# Patient Record
Sex: Female | Born: 1945
Health system: Southern US, Community
[De-identification: ages and names within clinical notes are randomized; demographics above are authoritative.]

## PROBLEM LIST (undated history)

## (undated) DIAGNOSIS — I6529 Occlusion and stenosis of unspecified carotid artery: Secondary | ICD-10-CM

## (undated) DIAGNOSIS — I739 Peripheral vascular disease, unspecified: Secondary | ICD-10-CM

## (undated) DIAGNOSIS — I6523 Occlusion and stenosis of bilateral carotid arteries: Secondary | ICD-10-CM

## (undated) DIAGNOSIS — I5020 Unspecified systolic (congestive) heart failure: Secondary | ICD-10-CM

## (undated) DIAGNOSIS — I214 Non-ST elevation (NSTEMI) myocardial infarction: Secondary | ICD-10-CM

## (undated) DIAGNOSIS — Z860101 Personal history of adenomatous and serrated colon polyps: Secondary | ICD-10-CM

## (undated) DIAGNOSIS — N183 Chronic kidney disease, stage 3 unspecified: Secondary | ICD-10-CM

## (undated) DIAGNOSIS — E7800 Pure hypercholesterolemia, unspecified: Secondary | ICD-10-CM

## (undated) DIAGNOSIS — J189 Pneumonia, unspecified organism: Secondary | ICD-10-CM

## (undated) DIAGNOSIS — Z720 Tobacco use: Secondary | ICD-10-CM

## (undated) DIAGNOSIS — M2550 Pain in unspecified joint: Secondary | ICD-10-CM

## (undated) DIAGNOSIS — I10 Essential (primary) hypertension: Secondary | ICD-10-CM

## (undated) DIAGNOSIS — N179 Acute kidney failure, unspecified: Secondary | ICD-10-CM

## (undated) DIAGNOSIS — R001 Bradycardia, unspecified: Secondary | ICD-10-CM

## (undated) DIAGNOSIS — D696 Thrombocytopenia, unspecified: Secondary | ICD-10-CM

## (undated) DIAGNOSIS — I255 Ischemic cardiomyopathy: Secondary | ICD-10-CM

## (undated) DIAGNOSIS — D693 Immune thrombocytopenic purpura: Secondary | ICD-10-CM

## (undated) DIAGNOSIS — E119 Type 2 diabetes mellitus without complications: Secondary | ICD-10-CM

## (undated) DIAGNOSIS — I200 Unstable angina: Secondary | ICD-10-CM

## (undated) DIAGNOSIS — I455 Other specified heart block: Secondary | ICD-10-CM

## (undated) DIAGNOSIS — B1920 Unspecified viral hepatitis C without hepatic coma: Secondary | ICD-10-CM

## (undated) DIAGNOSIS — I70219 Atherosclerosis of native arteries of extremities with intermittent claudication, unspecified extremity: Secondary | ICD-10-CM

## (undated) DIAGNOSIS — R7989 Other specified abnormal findings of blood chemistry: Secondary | ICD-10-CM

## (undated) DIAGNOSIS — Z48812 Encounter for surgical aftercare following surgery on the circulatory system: Secondary | ICD-10-CM

## (undated) DIAGNOSIS — I5022 Chronic systolic (congestive) heart failure: Secondary | ICD-10-CM

## (undated) DIAGNOSIS — M79609 Pain in unspecified limb: Secondary | ICD-10-CM

## (undated) DIAGNOSIS — Z86010 Personal history of colonic polyps: Secondary | ICD-10-CM

## (undated) DIAGNOSIS — N19 Unspecified kidney failure: Secondary | ICD-10-CM

## (undated) DIAGNOSIS — Z87440 Personal history of urinary (tract) infections: Secondary | ICD-10-CM

## (undated) DIAGNOSIS — E1159 Type 2 diabetes mellitus with other circulatory complications: Secondary | ICD-10-CM

## (undated) DIAGNOSIS — I639 Cerebral infarction, unspecified: Secondary | ICD-10-CM

## (undated) DIAGNOSIS — I701 Atherosclerosis of renal artery: Secondary | ICD-10-CM

## (undated) DIAGNOSIS — N184 Chronic kidney disease, stage 4 (severe): Secondary | ICD-10-CM

## (undated) DIAGNOSIS — I2510 Atherosclerotic heart disease of native coronary artery without angina pectoris: Secondary | ICD-10-CM

## (undated) DIAGNOSIS — D497 Neoplasm of unspecified behavior of endocrine glands and other parts of nervous system: Secondary | ICD-10-CM

## (undated) DIAGNOSIS — I447 Left bundle-branch block, unspecified: Secondary | ICD-10-CM

## (undated) DIAGNOSIS — I779 Disorder of arteries and arterioles, unspecified: Secondary | ICD-10-CM

## (undated) DIAGNOSIS — C50911 Malignant neoplasm of unspecified site of right female breast: Secondary | ICD-10-CM

## (undated) DIAGNOSIS — R0600 Dyspnea, unspecified: Secondary | ICD-10-CM

## (undated) HISTORY — PX: ABDOMINAL HYSTERECTOMY: SHX81

## (undated) HISTORY — DX: Chronic systolic (congestive) heart failure: I50.22

## (undated) HISTORY — DX: Personal history of colonic polyps: Z86.010

## (undated) HISTORY — DX: Unspecified kidney failure: N19

## (undated) HISTORY — DX: Cerebral infarction, unspecified: I63.9

## (undated) HISTORY — DX: Dyspnea, unspecified: R06.00

## (undated) HISTORY — DX: Occlusion and stenosis of unspecified carotid artery: I65.29

## (undated) HISTORY — DX: Unspecified viral hepatitis C without hepatic coma: B19.20

## (undated) HISTORY — DX: Other specified heart block: I45.5

## (undated) HISTORY — DX: Thrombocytopenia, unspecified: D69.6

## (undated) HISTORY — DX: Pneumonia, unspecified organism: J18.9

## (undated) HISTORY — DX: Atherosclerotic heart disease of native coronary artery without angina pectoris: I25.10

## (undated) HISTORY — DX: Immune thrombocytopenic purpura: D69.3

## (undated) HISTORY — DX: Peripheral vascular disease, unspecified: I73.9

## (undated) HISTORY — DX: Chronic kidney disease, stage 3 (moderate): N18.3

## (undated) HISTORY — DX: Atherosclerosis of native arteries of extremities with intermittent claudication, unspecified extremity: I70.219

## (undated) HISTORY — DX: Type 2 diabetes mellitus with other circulatory complications: E11.59

## (undated) HISTORY — DX: Occlusion and stenosis of bilateral carotid arteries: I65.23

## (undated) HISTORY — DX: Unstable angina: I20.0

## (undated) HISTORY — DX: Other specified abnormal findings of blood chemistry: R79.89

## (undated) HISTORY — DX: Bradycardia, unspecified: R00.1

## (undated) HISTORY — DX: Tobacco use: Z72.0

## (undated) HISTORY — DX: Left bundle-branch block, unspecified: I44.7

## (undated) HISTORY — DX: Disorder of arteries and arterioles, unspecified: I77.9

## (undated) HISTORY — PX: LUMBAR DISC SURGERY: SHX700

## (undated) HISTORY — DX: Pain in unspecified limb: M79.609

## (undated) HISTORY — DX: Atherosclerosis of renal artery: I70.1

## (undated) HISTORY — DX: Non-ST elevation (NSTEMI) myocardial infarction: I21.4

## (undated) HISTORY — DX: Chronic kidney disease, stage 4 (severe): N18.4

## (undated) HISTORY — DX: Personal history of adenomatous and serrated colon polyps: Z86.0101

## (undated) HISTORY — DX: Chronic kidney disease, stage 3 unspecified: N18.30

## (undated) HISTORY — DX: Acute kidney failure, unspecified: N17.9

## (undated) HISTORY — DX: Ischemic cardiomyopathy: I25.5

## (undated) HISTORY — DX: Encounter for surgical aftercare following surgery on the circulatory system: Z48.812

---

## 1997-10-23 ENCOUNTER — Emergency Department (HOSPITAL_COMMUNITY): Admission: EM | Admit: 1997-10-23 | Discharge: 1997-10-23 | Payer: Self-pay | Admitting: Emergency Medicine

## 1998-06-24 ENCOUNTER — Emergency Department (HOSPITAL_COMMUNITY): Admission: EM | Admit: 1998-06-24 | Discharge: 1998-06-24 | Payer: Self-pay | Admitting: Emergency Medicine

## 1998-08-25 ENCOUNTER — Encounter: Admission: RE | Admit: 1998-08-25 | Discharge: 1998-09-12 | Payer: Self-pay | Admitting: Internal Medicine

## 1999-08-26 ENCOUNTER — Emergency Department (HOSPITAL_COMMUNITY): Admission: EM | Admit: 1999-08-26 | Discharge: 1999-08-26 | Payer: Self-pay | Admitting: Emergency Medicine

## 2000-10-19 ENCOUNTER — Encounter: Payer: Self-pay | Admitting: Internal Medicine

## 2000-10-19 ENCOUNTER — Encounter: Admission: RE | Admit: 2000-10-19 | Discharge: 2000-10-19 | Payer: Self-pay | Admitting: Internal Medicine

## 2000-12-10 ENCOUNTER — Emergency Department (HOSPITAL_COMMUNITY): Admission: EM | Admit: 2000-12-10 | Discharge: 2000-12-11 | Payer: Self-pay | Admitting: Emergency Medicine

## 2003-06-14 ENCOUNTER — Emergency Department (HOSPITAL_COMMUNITY): Admission: EM | Admit: 2003-06-14 | Discharge: 2003-06-15 | Payer: Self-pay | Admitting: Emergency Medicine

## 2003-07-09 ENCOUNTER — Emergency Department (HOSPITAL_COMMUNITY): Admission: EM | Admit: 2003-07-09 | Discharge: 2003-07-09 | Payer: Self-pay | Admitting: Emergency Medicine

## 2003-09-04 ENCOUNTER — Encounter: Admission: RE | Admit: 2003-09-04 | Discharge: 2003-09-04 | Payer: Self-pay | Admitting: Internal Medicine

## 2003-10-09 ENCOUNTER — Emergency Department (HOSPITAL_COMMUNITY): Admission: EM | Admit: 2003-10-09 | Discharge: 2003-10-09 | Payer: Self-pay | Admitting: Emergency Medicine

## 2004-04-08 ENCOUNTER — Encounter: Admission: RE | Admit: 2004-04-08 | Discharge: 2004-04-08 | Payer: Self-pay | Admitting: Internal Medicine

## 2004-08-13 ENCOUNTER — Encounter: Admission: RE | Admit: 2004-08-13 | Discharge: 2004-08-13 | Payer: Self-pay | Admitting: Internal Medicine

## 2004-09-10 ENCOUNTER — Emergency Department (HOSPITAL_COMMUNITY): Admission: EM | Admit: 2004-09-10 | Discharge: 2004-09-10 | Payer: Self-pay | Admitting: Emergency Medicine

## 2004-09-29 ENCOUNTER — Encounter: Admission: RE | Admit: 2004-09-29 | Discharge: 2004-10-19 | Payer: Self-pay | Admitting: Internal Medicine

## 2004-10-20 ENCOUNTER — Encounter: Admission: RE | Admit: 2004-10-20 | Discharge: 2004-11-11 | Payer: Self-pay | Admitting: Internal Medicine

## 2004-11-03 ENCOUNTER — Ambulatory Visit: Payer: Self-pay | Admitting: Gastroenterology

## 2005-01-01 ENCOUNTER — Ambulatory Visit: Payer: Self-pay | Admitting: Gastroenterology

## 2005-02-08 ENCOUNTER — Ambulatory Visit (HOSPITAL_COMMUNITY): Admission: RE | Admit: 2005-02-08 | Discharge: 2005-02-08 | Payer: Self-pay | Admitting: Gastroenterology

## 2005-02-08 ENCOUNTER — Encounter (INDEPENDENT_AMBULATORY_CARE_PROVIDER_SITE_OTHER): Payer: Self-pay | Admitting: *Deleted

## 2005-04-28 ENCOUNTER — Encounter: Admission: RE | Admit: 2005-04-28 | Discharge: 2005-04-28 | Payer: Self-pay | Admitting: Family Medicine

## 2005-06-07 ENCOUNTER — Ambulatory Visit (HOSPITAL_COMMUNITY): Admission: RE | Admit: 2005-06-07 | Discharge: 2005-06-07 | Payer: Self-pay | Admitting: Internal Medicine

## 2005-11-25 ENCOUNTER — Encounter: Admission: RE | Admit: 2005-11-25 | Discharge: 2005-11-25 | Payer: Self-pay | Admitting: Internal Medicine

## 2005-12-06 ENCOUNTER — Encounter: Admission: RE | Admit: 2005-12-06 | Discharge: 2005-12-06 | Payer: Self-pay | Admitting: Internal Medicine

## 2005-12-23 ENCOUNTER — Emergency Department (HOSPITAL_COMMUNITY): Admission: EM | Admit: 2005-12-23 | Discharge: 2005-12-23 | Payer: Self-pay | Admitting: Emergency Medicine

## 2006-04-18 ENCOUNTER — Encounter: Admission: RE | Admit: 2006-04-18 | Discharge: 2006-04-18 | Payer: Self-pay | Admitting: *Deleted

## 2006-05-03 HISTORY — PX: RENAL ARTERY STENT: SHX2321

## 2006-05-09 ENCOUNTER — Encounter: Admission: RE | Admit: 2006-05-09 | Discharge: 2006-05-09 | Payer: Self-pay | Admitting: *Deleted

## 2006-05-26 ENCOUNTER — Encounter: Admission: RE | Admit: 2006-05-26 | Discharge: 2006-05-26 | Payer: Self-pay | Admitting: *Deleted

## 2006-05-31 ENCOUNTER — Ambulatory Visit (HOSPITAL_COMMUNITY): Admission: AD | Admit: 2006-05-31 | Discharge: 2006-05-31 | Payer: Self-pay | Admitting: *Deleted

## 2006-06-30 ENCOUNTER — Encounter: Admission: RE | Admit: 2006-06-30 | Discharge: 2006-06-30 | Payer: Self-pay | Admitting: Interventional Radiology

## 2006-07-13 ENCOUNTER — Ambulatory Visit (HOSPITAL_COMMUNITY): Admission: RE | Admit: 2006-07-13 | Discharge: 2006-07-13 | Payer: Self-pay | Admitting: Internal Medicine

## 2006-07-20 ENCOUNTER — Encounter (INDEPENDENT_AMBULATORY_CARE_PROVIDER_SITE_OTHER): Payer: Self-pay | Admitting: Diagnostic Radiology

## 2006-07-20 ENCOUNTER — Encounter: Admission: RE | Admit: 2006-07-20 | Discharge: 2006-07-20 | Payer: Self-pay

## 2006-09-06 ENCOUNTER — Encounter: Admission: RE | Admit: 2006-09-06 | Discharge: 2006-09-06 | Payer: Self-pay | Admitting: Surgery

## 2006-09-06 ENCOUNTER — Encounter (INDEPENDENT_AMBULATORY_CARE_PROVIDER_SITE_OTHER): Payer: Self-pay | Admitting: Surgery

## 2006-09-06 ENCOUNTER — Ambulatory Visit (HOSPITAL_BASED_OUTPATIENT_CLINIC_OR_DEPARTMENT_OTHER): Admission: RE | Admit: 2006-09-06 | Discharge: 2006-09-06 | Payer: Self-pay | Admitting: Surgery

## 2006-09-26 ENCOUNTER — Encounter (INDEPENDENT_AMBULATORY_CARE_PROVIDER_SITE_OTHER): Payer: Self-pay | Admitting: Surgery

## 2006-09-26 ENCOUNTER — Ambulatory Visit (HOSPITAL_BASED_OUTPATIENT_CLINIC_OR_DEPARTMENT_OTHER): Admission: RE | Admit: 2006-09-26 | Discharge: 2006-09-26 | Payer: Self-pay | Admitting: Surgery

## 2006-09-30 ENCOUNTER — Encounter: Admission: RE | Admit: 2006-09-30 | Discharge: 2006-09-30 | Payer: Self-pay | Admitting: *Deleted

## 2006-10-20 ENCOUNTER — Ambulatory Visit: Payer: Self-pay | Admitting: Oncology

## 2006-10-25 LAB — CBC WITH DIFFERENTIAL/PLATELET
BASO%: 1.7 % (ref 0.0–2.0)
Basophils Absolute: 0.1 10*3/uL (ref 0.0–0.1)
EOS%: 1.9 % (ref 0.0–7.0)
HCT: 39.5 % (ref 34.8–46.6)
HGB: 13.8 g/dL (ref 11.6–15.9)
LYMPH%: 40 % (ref 14.0–48.0)
MCH: 30 pg (ref 26.0–34.0)
MCHC: 35 g/dL (ref 32.0–36.0)
MCV: 85.8 fL (ref 81.0–101.0)
MONO%: 8 % (ref 0.0–13.0)
NEUT%: 48.4 % (ref 39.6–76.8)

## 2006-10-25 LAB — COMPREHENSIVE METABOLIC PANEL
ALT: 24 U/L (ref 0–35)
AST: 23 U/L (ref 0–37)
Alkaline Phosphatase: 92 U/L (ref 39–117)
BUN: 17 mg/dL (ref 6–23)
Calcium: 10.1 mg/dL (ref 8.4–10.5)
Creatinine, Ser: 0.72 mg/dL (ref 0.40–1.20)
Total Bilirubin: 0.7 mg/dL (ref 0.3–1.2)

## 2006-10-31 ENCOUNTER — Ambulatory Visit: Admission: RE | Admit: 2006-10-31 | Discharge: 2007-01-15 | Payer: Self-pay | Admitting: Radiation Oncology

## 2006-12-08 ENCOUNTER — Encounter: Admission: RE | Admit: 2006-12-08 | Discharge: 2006-12-08 | Payer: Self-pay | Admitting: Interventional Radiology

## 2007-01-24 ENCOUNTER — Ambulatory Visit: Payer: Self-pay | Admitting: Oncology

## 2007-02-02 HISTORY — PX: BREAST LUMPECTOMY: SHX2

## 2007-02-02 HISTORY — PX: ADRENAL GLAND SURGERY: SHX544

## 2007-02-03 ENCOUNTER — Ambulatory Visit (HOSPITAL_COMMUNITY): Admission: RE | Admit: 2007-02-03 | Discharge: 2007-02-03 | Payer: Self-pay | Admitting: Nephrology

## 2007-03-07 ENCOUNTER — Ambulatory Visit: Payer: Self-pay | Admitting: Oncology

## 2007-03-09 LAB — CBC WITH DIFFERENTIAL/PLATELET
BASO%: 0.6 % (ref 0.0–2.0)
Basophils Absolute: 0 10*3/uL (ref 0.0–0.1)
EOS%: 2.5 % (ref 0.0–7.0)
MCH: 28.1 pg (ref 26.0–34.0)
MCHC: 33.1 g/dL (ref 32.0–36.0)
MCV: 85.1 fL (ref 81.0–101.0)
MONO%: 10.2 % (ref 0.0–13.0)
RDW: 13.2 % (ref 11.3–14.5)
lymph#: 1.3 10*3/uL (ref 0.9–3.3)

## 2007-03-09 LAB — COMPREHENSIVE METABOLIC PANEL
ALT: 32 U/L (ref 0–35)
AST: 31 U/L (ref 0–37)
Albumin: 4.3 g/dL (ref 3.5–5.2)
Alkaline Phosphatase: 80 U/L (ref 39–117)
BUN: 17 mg/dL (ref 6–23)
Calcium: 10 mg/dL (ref 8.4–10.5)
Chloride: 102 mEq/L (ref 96–112)
Creatinine, Ser: 0.73 mg/dL (ref 0.40–1.20)
Potassium: 3.7 mEq/L (ref 3.5–5.3)

## 2007-07-21 ENCOUNTER — Encounter: Admission: RE | Admit: 2007-07-21 | Discharge: 2007-07-21 | Payer: Self-pay | Admitting: Oncology

## 2007-08-02 DIAGNOSIS — D497 Neoplasm of unspecified behavior of endocrine glands and other parts of nervous system: Secondary | ICD-10-CM

## 2007-08-02 HISTORY — DX: Neoplasm of unspecified behavior of endocrine glands and other parts of nervous system: D49.7

## 2007-08-13 ENCOUNTER — Emergency Department (HOSPITAL_COMMUNITY): Admission: EM | Admit: 2007-08-13 | Discharge: 2007-08-13 | Payer: Self-pay | Admitting: Emergency Medicine

## 2007-08-15 ENCOUNTER — Ambulatory Visit: Payer: Self-pay | Admitting: Oncology

## 2007-08-29 ENCOUNTER — Inpatient Hospital Stay (HOSPITAL_COMMUNITY): Admission: RE | Admit: 2007-08-29 | Discharge: 2007-08-31 | Payer: Self-pay | Admitting: Surgery

## 2007-08-29 ENCOUNTER — Encounter (INDEPENDENT_AMBULATORY_CARE_PROVIDER_SITE_OTHER): Payer: Self-pay | Admitting: Surgery

## 2007-10-03 ENCOUNTER — Ambulatory Visit: Payer: Self-pay | Admitting: Oncology

## 2007-10-03 LAB — CBC WITH DIFFERENTIAL/PLATELET
BASO%: 0.6 % (ref 0.0–2.0)
Basophils Absolute: 0 10*3/uL (ref 0.0–0.1)
EOS%: 2.7 % (ref 0.0–7.0)
HGB: 11.2 g/dL — ABNORMAL LOW (ref 11.6–15.9)
MCH: 29.4 pg (ref 26.0–34.0)
MCHC: 33.5 g/dL (ref 32.0–36.0)
MONO%: 11.9 % (ref 0.0–13.0)
RBC: 3.82 10*6/uL (ref 3.70–5.32)
RDW: 14.3 % (ref 11.3–14.5)
lymph#: 1.7 10*3/uL (ref 0.9–3.3)

## 2007-10-03 LAB — COMPREHENSIVE METABOLIC PANEL
ALT: 15 U/L (ref 0–35)
AST: 17 U/L (ref 0–37)
Albumin: 4.2 g/dL (ref 3.5–5.2)
Alkaline Phosphatase: 76 U/L (ref 39–117)
Calcium: 10.5 mg/dL (ref 8.4–10.5)
Chloride: 107 mEq/L (ref 96–112)
Potassium: 4.3 mEq/L (ref 3.5–5.3)
Sodium: 139 mEq/L (ref 135–145)
Total Protein: 7.1 g/dL (ref 6.0–8.3)

## 2008-07-22 ENCOUNTER — Encounter: Admission: RE | Admit: 2008-07-22 | Discharge: 2008-07-22 | Payer: Self-pay | Admitting: Oncology

## 2008-09-30 ENCOUNTER — Ambulatory Visit: Payer: Self-pay | Admitting: Oncology

## 2009-03-22 ENCOUNTER — Emergency Department (HOSPITAL_COMMUNITY): Admission: EM | Admit: 2009-03-22 | Discharge: 2009-03-22 | Payer: Self-pay | Admitting: Emergency Medicine

## 2009-07-24 ENCOUNTER — Encounter: Admission: RE | Admit: 2009-07-24 | Discharge: 2009-07-24 | Payer: Self-pay | Admitting: Oncology

## 2010-01-03 ENCOUNTER — Emergency Department (HOSPITAL_COMMUNITY)
Admission: EM | Admit: 2010-01-03 | Discharge: 2010-01-03 | Payer: Self-pay | Source: Home / Self Care | Admitting: Emergency Medicine

## 2010-01-16 ENCOUNTER — Ambulatory Visit (HOSPITAL_COMMUNITY)
Admission: RE | Admit: 2010-01-16 | Discharge: 2010-01-16 | Payer: Self-pay | Source: Home / Self Care | Attending: Nephrology | Admitting: Nephrology

## 2010-04-13 LAB — POCT I-STAT, CHEM 8
BUN: 15 mg/dL (ref 6–23)
Calcium, Ion: 1.26 mmol/L (ref 1.12–1.32)
Chloride: 110 mEq/L (ref 96–112)
Glucose, Bld: 119 mg/dL — ABNORMAL HIGH (ref 70–99)

## 2010-04-13 LAB — CBC
HCT: 38.6 % (ref 36.0–46.0)
MCHC: 32.6 g/dL (ref 30.0–36.0)
MCV: 88.1 fL (ref 78.0–100.0)
RDW: 13.7 % (ref 11.5–15.5)

## 2010-04-13 LAB — APTT: aPTT: 28 seconds (ref 24–37)

## 2010-04-13 LAB — GLUCOSE, CAPILLARY: Glucose-Capillary: 108 mg/dL — ABNORMAL HIGH (ref 70–99)

## 2010-04-22 ENCOUNTER — Other Ambulatory Visit: Payer: Self-pay | Admitting: Family Medicine

## 2010-04-22 DIAGNOSIS — R0989 Other specified symptoms and signs involving the circulatory and respiratory systems: Secondary | ICD-10-CM

## 2010-04-22 LAB — DIFFERENTIAL
Basophils Absolute: 0 10*3/uL (ref 0.0–0.1)
Eosinophils Relative: 0 % (ref 0–5)
Lymphocytes Relative: 9 % — ABNORMAL LOW (ref 12–46)
Monocytes Absolute: 0.9 10*3/uL (ref 0.1–1.0)
Monocytes Relative: 10 % (ref 3–12)

## 2010-04-22 LAB — POCT I-STAT, CHEM 8
BUN: 18 mg/dL (ref 6–23)
Chloride: 108 mEq/L (ref 96–112)
Creatinine, Ser: 1 mg/dL (ref 0.4–1.2)
Potassium: 3.1 mEq/L — ABNORMAL LOW (ref 3.5–5.1)
Sodium: 139 mEq/L (ref 135–145)
TCO2: 24 mmol/L (ref 0–100)

## 2010-04-22 LAB — CBC
HCT: 35 % — ABNORMAL LOW (ref 36.0–46.0)
Hemoglobin: 12.1 g/dL (ref 12.0–15.0)
RDW: 13 % (ref 11.5–15.5)

## 2010-04-22 LAB — POCT CARDIAC MARKERS
CKMB, poc: 1.2 ng/mL (ref 1.0–8.0)
Troponin i, poc: 0.06 ng/mL (ref 0.00–0.09)

## 2010-04-27 ENCOUNTER — Ambulatory Visit
Admission: RE | Admit: 2010-04-27 | Discharge: 2010-04-27 | Disposition: A | Discharge status: Home / Self Care | Payer: Medicare HMO | Source: Ambulatory Visit | Attending: Family Medicine | Admitting: Family Medicine

## 2010-04-27 DIAGNOSIS — R0989 Other specified symptoms and signs involving the circulatory and respiratory systems: Secondary | ICD-10-CM

## 2010-06-16 NOTE — Discharge Summary (Signed)
NAME:  Kristen Bradley, Kristen Bradley                  ACCOUNT NO.:  [id number]   MEDICAL RECORD NO.:  [id number]          PATIENT TYPE:  INP   LOCATION:  [id number]                         FACILITY:  [facility]   PHYSICIAN:  Adin Hector, MD     DATE OF BIRTH:  1945/12/29   DATE OF ADMISSION:  08/29/2007  DATE OF DISCHARGE:  08/31/2007                               DISCHARGE SUMMARY   SURGEON:  Michael Boston, M.D.   PRIMARY CARE PHYSICIAN:  Bonnita Hollow, Nurse Practitioner at [facility] in  [place].   NEPHROLOGIST:  Windy Kalata, M.D.   CHIEF FINAL DISCHARGE DIAGNOSIS:  1. Left adrenal aldosteronoma.  2. ALLERGY TO DILAUDID.   DIAGNOSES:  1. Severe hypertension secondary to above.  2. Atherosclerotic disease of the aortoiliac region.  3. Claudication.  4. Diabetes mellitus.  5. Renal artery stenosis, status post bilateral renal artery stenting      in 05/2006.  6. Hepatitis C.  7. Ductal carcinoma in situ to right breast, status post breast biopsy      September 06, 2007.  8. Lumbar disc disease, status post back surgery.  9. Tobacco abuse, currently smokes one-half pack per day.   PROCEDURE PERFORMED:  Laparoscopic left adrenalectomy with lysis of  adhesions and control of splenic bleeding on August 29, 2007.   HOSPITAL COURSE:  Ms. Lessard is a 65 year old female with numerous health  issues who had hypertension requiring numerous blood pressure  medications to control.  She has had renal artery stenosis that has been  controlled but still had elevation.  She had a work up that was  concerning for an aldosteronoma based on venous sampling.   The patient was sent for surgical consultation.  Recommendations made  for adrenalectomy.  I thought she would be a reasonable candidate for a  laparoscopic approach given her reasonably body habitus and small  adenoma.  Risks, benefits and alternatives were discussed in detail,  questions were answered and she agreed to proceed.  She underwent this  on  August 29, 2007 without any difficulty.   Postoperatively, she required intravenous fluids, pain and nausea  control.  She had some itching with Dilaudid but was switched over.  She  was advanced on a diet and by postoperative day #2 she was walking the  hallways with adequate pain control.  Based on the improvements, I felt  it would be reasonable for her to be discharged home with the following  instructions:   We followed and controlled her blood pressure and diabetic issues.  She  had itching to Dilaudid.   DISCHARGE INSTRUCTIONS:  1. She is to return to the clinic to see me in two weeks.  2. She should continue her home blood pressure medications for now and      taper off as needed.  These medications include:      a.     Atenolol 100 mg daily.      b.     Spironolactone 25 mg b.i.d.      c.     Clonidine 0.1 mg twice daily.  d.     Amlodipine 10 mg daily.      e.     Benicar 20 mg daily.      f.     Aspirin 81 mg daily.      g.     Potassium chloride 40 mEq daily.      h.     Metformin 500 p.o. b.i.d.  3. If the patient should call if she has any fevers, chills, sweats,      nausea, vomiting worsening abdominal pain, drainage from her      incisions or other concerns.  4. She should begin to increase activity as tolerated, walking up to      30 minutes a day.  Call if she has any worsening pain or      discomfort.  5. Her family felt comfortable in helping to manage her and will call      if they have any issues.      Adin Hector, MD  Electronically Signed     SCG/MEDQ  D:  10/02/2007  T:  10/02/2007  Job:  [id number]   cc:   Jamas Lav, N.PSadie Haber at [facility], M.D.  Fax: [phone]

## 2010-06-16 NOTE — Op Note (Signed)
NAME:  Kristen Bradley, Kristen Bradley                  ACCOUNT NO.:  [id number]   MEDICAL RECORD NO.:  [id number]          PATIENT TYPE:  AMB   LOCATION:  [place]                          FACILITY:  [facility]   PHYSICIAN:  Thomas A. Cornett, M.D.DATE OF BIRTH:  Jul 20, 1945   DATE OF PROCEDURE:  09/26/2006  DATE OF DISCHARGE:                               OPERATIVE REPORT   PREOPERATIVE DIAGNOSIS:  Right breast DCIS with close margin.   POSTOPERATIVE DIAGNOSIS:  Right breast DCIS with close margin.   PROCEDURE:  Re-excision of right breast, lumpectomy cavity.   SURGEON:  Erroll Luna, M.D.   ANESTHESIA:  MAC with 30 mL of 0.25% Sensorcaine.   ESTIMATED BLOOD LOSS:  20 mL.   SPECIMEN:  Lumpectomy cavity and margins identified by new stitch on  surgical margin to pathology.   DRAINS:  None.   INDICATIONS FOR PROCEDURE:  The patient is a 65 year old female who had  a right breast lumpectomy for DCIS.  Her margins were negative, but  superficial and deep margins were at 0.18 cm.  She refused radiation  therapy postoperatively and did not want a mastectomy.  Recommended re-  excision for wider margin for better control.  She agreed to do this.   DESCRIPTION OF PROCEDURE:  The patient was brought to the operating room  and placed supine.  After induction of MAC anesthesia, right breast was  prepped and draped in a sterile fashion.  Previous right upper quadrant  scar was identified and injected local anesthesia.  I opened the  incision and found the lumpectomy cavity.  Superficial, deep, superior,  inferior, medial and lateral margins were all reexcised.  Hemostasis was  excellent.  Wounds were closed in layers with 3-0 Vicryl and 4-0  Monocryl for a subcuticular stitch.  Dermabond was placed for dressing.  All final counts of sponge, needle and instruments were found be correct  at this portion of the case.  The patient was awoke and taken to  recovery in satisfactory condition.      Thomas A.  Cornett, M.D.  Electronically Signed     TAC/MEDQ  D:  09/26/2006  T:  09/27/2006  Job:  [id number]

## 2010-06-16 NOTE — Op Note (Signed)
NAME:  Kristen Bradley, Kristen Bradley                  ACCOUNT NO.:  [id number]   MEDICAL RECORD NO.:  [id number]          PATIENT TYPE:  INP   LOCATION:  0001                         [facility]ACILITY:  [facility]   PHYSICIAN:  Adin Hector, MD     DATE OF BIRTH:  11/15/1945   DATE OF PROCEDURE:  08/29/2007  DATE OF DISCHARGE:                               OPERATIVE REPORT   PRIMARY CARE PHYSICIAN:  Talmage Nap, NP, at [facility].   NEPHROLOGIST:  Fleet Contras, MD.   PREOPERATIVE DIAGNOSIS:  Left adrenal aldosteronoma with severe  hypertension.   POSTOPERATIVE DIAGNOSIS:  Left adrenal aldosteronoma with severe  hypertension.   SURGEON:  Michael Boston, MD.   ASSISTANT:  Jackolyn Confer, MD.   PROCEDURE PERFORMED:  1. Laparoscopic lysis of adhesions x30 minutes.  2. Laparoscopic left adrenalectomy.  3. Laparoscopic control of splenic bleeding.   ANESTHESIA:  1. General anesthesia.  2. Local anesthetic of field block at all port sites.   SPECIMENS:  Left adrenal gland with firm mass at the superior pole of  the adrenal gland consistent with a small adrenaloma.   DRAINS:  A 19-French Blake drain rests along the left retroperitoneum  and posterior to the spleen out a left upper quadrant port site.   ESTIMATED BLOOD LOSS:  1200 mL.   COMPLICATIONS:  Tear near the splenic hilum, resulting in need of more  aggressive control.   INDICATIONS:  [name] is a 65 year old female with numerous health  issues and severe hypertension on numerous blood pressure medications  with workup showing a left adrenal mass and adrenal vein sampling  consistent with a left adrenal aldosteronoma.   Nanophysiology adrenal gland was discussed.  Pathophysiology of  aldosteronoma with hypertension was discussed as well.  I have discussed  this need and recommendation was made for a laparoscopic, possible open,  removal left adrenal gland.  Risks, benefits and alternative were  discussed.  The risks of  bleeding, transfusion, wound infection,  abscess, injury to organs and numerous other risks were discussed.  Questions were answered and she agreed to proceed.  I again discussed  the case just prior to surgery as well.  She felt comfortable to  proceed.   OPERATIVE FINDINGS:  She had dense adhesions in the left upper quadrant  of her greater omentum of uncertain etiology.  In fact, she had no prior  abdominal surgery.  She had a slightly enlarged left adrenal gland with  the classic cadmium yellow/orange appearance.  There was some fibrotic  mass in the superoposterior aspect consistent with probable  aldosteronoma.  She had an enlarged and fragile spleen with some dense  fibrosis.   DESCRIPTION OF PROCEDURE:  Informed consent was confirmed.  Patient  received IV cefazolin just prior to surgery.  She underwent general  anesthesia without any difficulty.  She had Foley catheter sterilely  placed.  She was positioned right side down with an axillary roll in  place and gentle arm support out laterally.  She had been placed on a  sandbag.  She had the classic break with  a kidney type break in the  table.  She was secured numerous places.  Her left abdomen and left back  were prepped and draped in the sterile fashion.   A 5 mm port was placed in left upper quadrant using optical entry  technique with the patient in reverse Trendelenburg and after orogastric  tube had been placed.  Capnoperitoneum to 15 mmHg provided good intra-  abdominal insufflation.  There was no evidence of any intra-abdominal  injury.  Under direct visualization a 5 mm port was placed in the left  anterior axilla and the left mid axillary line.  The port was placed in  the left midclavicular line.  Later, one of the ports was upgraded to 10  and  the more medial port was upgraded to a 12 mm port site.   Camera inspection revealed findings as above.  The transverse colon and  splenic flexure of the colon was freed  off the greater omentum using  ultrasonic Harmonic dissection to help better expose the left kidney.  This greater omentum was densely adherent to the anterior abdominal  wall.  We did free that off and reflect that more towards the right out  of the way.  The attachments between the spleen and left kidney were  freed up using ultrasonic dissection to help reflect the spleen out of  the way.  In doing this, I saw a orange-yellow structure.  It was very  close to the medial aspect of the superior pole of the kidney.  I began  doing some dissection around it lateral to medial.  In reflecting, going  anterolaterally and then going more lateral posteriorly and starting to  free things off, I got into some more significant vessels.  We did come  across a 46mm vessel with some bleeding.  This was controlled and clips  were placed above and below it.  this, it seemed to go a little more  superiorly and was consistent with the left adrenal vein.  Further  dissection around the fatty mass revealed it was more likely to be the  candidate for the tail of the pancreas.   Attention was turned more in following the kidney more cephalad.  In  doing that, I rolled the spleen a little bit more out of the way.  This  was done by fitting more lateral attachments over.  Further dissection  was done and actually got close to the hilum of the spleen and ran into  some bleeding there.  I ended up packing the area with a 4x4.  She did  have a rather fatty retroperitoneum.  However, in freeing around there I  could a see more classic bright yellow, cadmium yellow, appearance more  consistent with adrenal gland.  We began to dissect laterally and  followed the superior pole of the left kidney up and find the structure.  I was able to free it off and find its lateral borders well.  It came  across superiorly as well.  In freeing inferiorly, the adrenal gland was  densely adherent to the superior pole of the kidney.   A careful  meticulous dissection was done using [place] and ultrasonic dissection  to help free it in a lateral to medial fashion.  Clips on a couple  vessels were made on the more medial aspect of the gland coming off the  inferior phrenic.  Ultimately, it came all the way around it, I was able  to free it out and  place it in an EndoCatch bag.  Inspection revealed a  classic yellow-range appearance of the adrenal gland with a classic  miter's cap appearance.  There is a more fibrotic mass more cephalad in  the gland.  We were able to get clean planes all around it, and the mass  came out intact.   Attention was turned more towards hemostasis.  Hemostasis was excellent  around where the adrenal gland had been.  In reinspecting where we  initially had been dissecting, this seemed consistent with the tail of  the pancreas.  There was no breach through the pancreas itself and  hemostasis was noted.  On the inferior and lateral aspects of the tip  and even superiorly, after getting a clip or two, we had good control.  However, bleeding was brisk at the splenic hilum.  We tried to do clips  to control this but this was not completely successful.  We felt we were  getting more bleeding every time we get one controlled another vessel  would rip.  Based on that, I mobilized the hilum and did 2 firings of a  45 stapler to come across half of the hilum for much better hemostasis.  The short gastrics were preserved.  About two-thirds of the spleen did  become purple but the superior tip was viable.  Meticulous inspection  was done and control was mixed with a few more clips on the spleen side.  On the retroperitoneal side where we had transected there was a little  bit of oozing and this was controlled using a #2-0 Vicryl stitch in a  figure-of-eight fashion a much better result.  There were some open  veins on the spleen and these were cauterized to help shrink them down.  I wound up placing  Surgicel and pressure.  I placed some FloSeal and  another Surgicel on there.  Please note that pressure was held  intermittently for about an hour totally but near the end, for at least  20 minutes, hemostasis was excellent.   Copious irrigation was done and I removed a significant amount of blood  clots.  Hemoglobin was low so we agreed with anesthesia to transfuse,  even though she was hemodynamically stable and making urine.  Given some  her cardiac history, thought it was safest to proceed with that.  Because we had done some above-average dissection of the pancreatic tail  and the slightly above-average bleeding, we decided to place a 19-French  drain and that was placed as noted above, secured to the skin using #3-0  nylon stitch.  The 10 and 12 mm port sites were closed with #0 Vicryl  using a laparoscopic suture passer.  These fascial stitches were tied  down and the capnoperitoneum was evacuated, ports were removed.  Skin  was closed using #4-0 Monocryl stitch.  Sterile dressings applied.   I explained the operative findings to the patient's family.      Adin Hector, MD  Electronically Signed     SCG/MEDQ  D:  08/29/2007  T:  08/29/2007  Job:  [id number]

## 2010-06-16 NOTE — Op Note (Signed)
NAME:  Shryock, Alany                  ACCOUNT NO.:  [id number]   MEDICAL RECORD NO.:  [id number]          PATIENT TYPE:  AMB   LOCATION:  [place]                          FACILITY:  [facility]   PHYSICIAN:  Thomas A. Cornett, M.D.DATE OF BIRTH:  [birth year]   DATE OF PROCEDURE:  09/06/2006  DATE OF DISCHARGE:                               OPERATIVE REPORT   PREOPERATIVE DIAGNOSIS:  Right breast atypical ductal hyperplasia with  microcalcifications.   POSTOPERATIVE DIAGNOSIS:  Right breast atypical ductal hyperplasia with  microcalcifications.   PROCEDURE:  Right breast needle-localized excisional biopsy.   SURGEON:  Erroll Luna, MD.   ANESTHESIA:  MAC with approximately 39 mL of 0.25% Sensorcaine with  epinephrine.   ESTIMATED BLOOD LOSS:  20 mL.   SPECIMEN:  Right breast tissue from the right upper outer quadrant with  localizing wire and clip from previous core biopsy with calcifications  and tissue to pathology.   INDICATIONS FOR PROCEDURE:  The patient is a 65 year old postmenopausal  female who was found to have calcifications that were atypical in her  right upper outer quadrant of her breast.  Core biopsy showed atypical  ductal hyperplasia.  She presents today for excision of this for further  diagnosis.   DESCRIPTION OF PROCEDURE:  The patient was brought to the operating room  after undergoing right breast needle localization.  Right breast prepped  and draped in sterile fashion.  After induction of MAC anesthesia.  I  infiltrated the skin in the right upper outer quadrant of 0.25%  Sensorcaine.  She received preoperative antibiotics curvilinear incision  was made around the wire.  The wire was pulled up through the incision  and tissue around the wire's tip was excised in its entirety.  Radiograph revealed this to be adequate with the calcifications clipped  and wire in its entirety.  Hemostasis was excellent.  Cautery was used  to control some slow oozing.  The wound was  inspected for 10 minutes  found to be hemostatic and then closed in layers using a 3-0 Vicryl and  4-0 Monocryl.  Dermabond was used to close the skin.  All final counts  of sponge, needle and instruments were found to be correct this portion  of the case.  The patient was awoke taken to recovery in satisfactory  condition.  Tissue was sent to pathology for further evaluation.      Thomas A. Cornett, M.D.  Electronically Signed    TAC/MEDQ  D:  09/06/2006  T:  09/06/2006  Job:  [id number]   cc:   Conchita Paris

## 2010-07-01 ENCOUNTER — Other Ambulatory Visit: Payer: Self-pay | Admitting: Family Medicine

## 2010-07-01 DIAGNOSIS — Z1231 Encounter for screening mammogram for malignant neoplasm of breast: Secondary | ICD-10-CM

## 2010-07-29 ENCOUNTER — Ambulatory Visit
Admission: RE | Admit: 2010-07-29 | Discharge: 2010-07-29 | Disposition: A | Discharge status: Home / Self Care | Payer: Medicare HMO | Source: Ambulatory Visit

## 2010-07-29 ENCOUNTER — Ambulatory Visit
Admission: RE | Admit: 2010-07-29 | Discharge: 2010-07-29 | Disposition: A | Discharge status: Home / Self Care | Payer: Medicare HMO | Source: Ambulatory Visit | Attending: Family Medicine | Admitting: Family Medicine

## 2010-07-29 ENCOUNTER — Other Ambulatory Visit: Payer: Self-pay | Admitting: Family Medicine

## 2010-07-29 DIAGNOSIS — Z1231 Encounter for screening mammogram for malignant neoplasm of breast: Secondary | ICD-10-CM

## 2010-10-21 LAB — CBC
HCT: 41.2
Hemoglobin: 14.5
MCHC: 35.2
MCV: 84.7
RDW: 13.4

## 2010-10-21 LAB — ALDOSTERONE
Aldosterone, Serum: 259 ng/dL
Aldosterone, Serum: 3 ng/dL
Aldosterone, Serum: 7 ng/dL

## 2010-10-21 LAB — CORTISOL
Cortisol, Plasma: 151.7
Cortisol, Plasma: 9.6

## 2010-10-30 LAB — COMPREHENSIVE METABOLIC PANEL
ALT: 31
Alkaline Phosphatase: 79
BUN: 15
CO2: 25
Calcium: 10.4
GFR calc non Af Amer: 57 — ABNORMAL LOW
Glucose, Bld: 185 — ABNORMAL HIGH
Sodium: 139

## 2010-10-30 LAB — GLUCOSE, CAPILLARY
Glucose-Capillary: 137 — ABNORMAL HIGH
Glucose-Capillary: 139 — ABNORMAL HIGH
Glucose-Capillary: 150 — ABNORMAL HIGH
Glucose-Capillary: 162 — ABNORMAL HIGH
Glucose-Capillary: 183 — ABNORMAL HIGH
Glucose-Capillary: 224 — ABNORMAL HIGH
Glucose-Capillary: 75

## 2010-10-30 LAB — BASIC METABOLIC PANEL
BUN: 11
CO2: 28
Chloride: 108
Creatinine, Ser: 0.68
Potassium: 4.8

## 2010-10-30 LAB — TYPE AND SCREEN: ABO/RH(D): O POS

## 2010-10-30 LAB — CBC
HCT: 32.1 — ABNORMAL LOW
HCT: 32.1 — ABNORMAL LOW
MCHC: 33.9
MCHC: 34.5
MCHC: 34.6
MCV: 89.3
MCV: 89.4
Platelets: 100 — ABNORMAL LOW
Platelets: 100 — ABNORMAL LOW
RDW: 13.3
RDW: 13.9
WBC: 11.8 — ABNORMAL HIGH
WBC: 9.9

## 2010-10-30 LAB — HEMOGLOBIN AND HEMATOCRIT, BLOOD: Hemoglobin: 12.9

## 2010-10-30 LAB — POCT I-STAT EG7
Bicarbonate: 21.5
Hemoglobin: 7.1 — CL
O2 Saturation: 86
Patient temperature: 35.1
Potassium: 5.1
TCO2: 23
pCO2, Ven: 42.8 — ABNORMAL LOW
pH, Ven: 7.299
pO2, Ven: 52 — ABNORMAL HIGH

## 2010-10-30 LAB — CREATININE, SERUM
Creatinine, Ser: 0.75
GFR calc Af Amer: >60
GFR calc non Af Amer: >60

## 2010-10-30 LAB — PROTIME-INR: INR: 0.9

## 2010-11-13 LAB — POCT HEMOGLOBIN-HEMACUE: Hemoglobin: 14.6

## 2010-11-13 LAB — BASIC METABOLIC PANEL
CO2: 24
Chloride: 106
GFR calc Af Amer: >60
Potassium: 3.7
Sodium: 138

## 2010-11-16 LAB — DIFFERENTIAL
Lymphocytes Relative: 37
Lymphs Abs: 2.1
Monocytes Absolute: 0.4
Monocytes Relative: 7
Neutro Abs: 3.1
Neutrophils Relative %: 54

## 2010-11-16 LAB — CBC
HCT: 40.9
Hemoglobin: 14
MCV: 85.3
RBC: 4.79
WBC: 5.8

## 2010-11-16 LAB — BASIC METABOLIC PANEL
Chloride: 105
Creatinine, Ser: 0.58
GFR calc Af Amer: >60
Potassium: 3.5
Sodium: 139

## 2010-11-16 LAB — POCT HEMOGLOBIN-HEMACUE: Hemoglobin: 14.3

## 2010-11-18 ENCOUNTER — Other Ambulatory Visit: Payer: Self-pay | Admitting: Family Medicine

## 2010-11-18 DIAGNOSIS — R0989 Other specified symptoms and signs involving the circulatory and respiratory systems: Secondary | ICD-10-CM

## 2010-11-23 ENCOUNTER — Ambulatory Visit
Admission: RE | Admit: 2010-11-23 | Discharge: 2010-11-23 | Disposition: A | Discharge status: Home / Self Care | Payer: Medicare HMO | Source: Ambulatory Visit | Attending: Family Medicine | Admitting: Family Medicine

## 2010-11-23 DIAGNOSIS — R0989 Other specified symptoms and signs involving the circulatory and respiratory systems: Secondary | ICD-10-CM

## 2010-12-30 ENCOUNTER — Observation Stay (HOSPITAL_COMMUNITY)
Admission: EM | Admit: 2010-12-30 | Discharge: 2010-12-31 | Disposition: A | Discharge status: Home / Self Care | Payer: Medicare HMO | Attending: Emergency Medicine | Admitting: Emergency Medicine

## 2010-12-30 ENCOUNTER — Emergency Department (HOSPITAL_COMMUNITY): Payer: Medicare HMO

## 2010-12-30 ENCOUNTER — Other Ambulatory Visit: Payer: Self-pay

## 2010-12-30 ENCOUNTER — Encounter: Payer: Self-pay | Admitting: *Deleted

## 2010-12-30 DIAGNOSIS — R079 Chest pain, unspecified: Principal | ICD-10-CM | POA: Insufficient documentation

## 2010-12-30 DIAGNOSIS — M25512 Pain in left shoulder: Secondary | ICD-10-CM

## 2010-12-30 DIAGNOSIS — I10 Essential (primary) hypertension: Secondary | ICD-10-CM | POA: Insufficient documentation

## 2010-12-30 DIAGNOSIS — E119 Type 2 diabetes mellitus without complications: Secondary | ICD-10-CM | POA: Insufficient documentation

## 2010-12-30 DIAGNOSIS — M25519 Pain in unspecified shoulder: Secondary | ICD-10-CM | POA: Insufficient documentation

## 2010-12-30 HISTORY — DX: Essential (primary) hypertension: I10

## 2010-12-30 HISTORY — DX: Pure hypercholesterolemia, unspecified: E78.00

## 2010-12-30 LAB — CBC
HCT: 39.3 % (ref 36.0–46.0)
MCV: 87.9 fL (ref 78.0–100.0)
Platelets: 126 10*3/uL — ABNORMAL LOW (ref 150–400)
RBC: 4.47 MIL/uL (ref 3.87–5.11)
WBC: 5.6 10*3/uL (ref 4.0–10.5)

## 2010-12-30 LAB — GLUCOSE, CAPILLARY: Glucose-Capillary: 124 mg/dL — ABNORMAL HIGH (ref 70–99)

## 2010-12-30 LAB — BASIC METABOLIC PANEL
BUN: 16 mg/dL (ref 6–23)
Chloride: 107 mEq/L (ref 96–112)
Creatinine, Ser: 0.73 mg/dL (ref 0.50–1.10)
GFR calc Af Amer: >90 mL/min (ref >90)
GFR calc non Af Amer: 89 mL/min — ABNORMAL LOW (ref >90)
Potassium: 4.3 mEq/L (ref 3.5–5.1)

## 2010-12-30 LAB — CARDIAC PANEL(CRET KIN+CKTOT+MB+TROPI)
CK, MB: 1.7 ng/mL (ref 0.3–4.0)
Relative Index: INVALID (ref 0.0–2.5)
Troponin I: <0.3 ng/mL (ref <0.30)

## 2010-12-30 LAB — POCT I-STAT TROPONIN I

## 2010-12-30 LAB — D-DIMER, QUANTITATIVE: D-Dimer, Quant: 1.06 ug/mL-FEU — ABNORMAL HIGH (ref 0.00–0.48)

## 2010-12-30 MED ORDER — GEMFIBROZIL 600 MG PO TABS
600.0000 mg | ORAL_TABLET | ORAL | Status: AC
  Administered 2010-12-30: 600 mg via ORAL
  Filled 2010-12-30: qty 1

## 2010-12-30 MED ORDER — AMLODIPINE BESYLATE 10 MG PO TABS
10.0000 mg | ORAL_TABLET | Freq: Once | ORAL | Status: AC
  Administered 2010-12-30: 10 mg via ORAL
  Filled 2010-12-30 (×2): qty 1

## 2010-12-30 MED ORDER — METFORMIN HCL 500 MG PO TABS
500.0000 mg | ORAL_TABLET | Freq: Once | ORAL | Status: AC
  Administered 2010-12-30: 500 mg via ORAL
  Filled 2010-12-30 (×2): qty 1

## 2010-12-30 MED ORDER — OXYCODONE-ACETAMINOPHEN 5-325 MG PO TABS
1.0000 | ORAL_TABLET | Freq: Once | ORAL | Status: AC
  Administered 2010-12-30: 1 via ORAL
  Filled 2010-12-30: qty 1

## 2010-12-30 MED ORDER — ACETAMINOPHEN 325 MG PO TABS: 650.0000 mg | ORAL_TABLET | ORAL | Status: DC | PRN

## 2010-12-30 MED ORDER — CLONIDINE HCL 0.1 MG PO TABS
0.1000 mg | ORAL_TABLET | Freq: Once | ORAL | Status: AC
  Administered 2010-12-30: 0.1 mg via ORAL
  Filled 2010-12-30: qty 1

## 2010-12-30 MED ORDER — ASPIRIN EC 325 MG PO TBEC: 325.0000 mg | DELAYED_RELEASE_TABLET | Freq: Every day | ORAL | Status: DC

## 2010-12-30 MED ORDER — ZOLPIDEM TARTRATE 5 MG PO TABS
5.0000 mg | ORAL_TABLET | Freq: Every evening | ORAL | Status: DC | PRN
  Administered 2010-12-30: 5 mg via ORAL
  Filled 2010-12-30: qty 1

## 2010-12-30 MED ORDER — ASPIRIN 325 MG PO TABS: 325.0000 mg | ORAL_TABLET | Freq: Once | ORAL | Status: DC

## 2010-12-30 NOTE — ED Notes (Signed)
Reports intermittent left shoulder, left chest and left arm pain that has become worse, now has a heaviness to mid chest. ekg being done at triage, no acute distress noted at triage.

## 2010-12-30 NOTE — ED Notes (Signed)
Pt refusing to do stress test tomorrow am. States hurts legs to bad to walk.

## 2010-12-30 NOTE — ED Notes (Signed)
Pt reports that she took 325mg  ASA this morning.

## 2010-12-30 NOTE — ED Notes (Signed)
Pt states pain in shoulder began several days ago and today pain radiates into left chest. pain increased with mvt. Denies sob or pain at rest.

## 2010-12-30 NOTE — ED Provider Notes (Signed)
Pt placed on Chest Pain protocol by Doctor Linker. Pt is a poor historian and complaining of a pressure on her chest. At rest and during movement. Pt is unable to do a stress test due to pain. SHe states that her provider has tried before but she could not do it, therefore pt is awaiting CT coronary angio in the morning. Pt has had family and friends stop by to see her throughout my shift and has not been complaining of pain. Upon the end of my shift, pt resting comfortably in bed.  Kristen Bradley, [place] 12/30/10 2309

## 2010-12-30 NOTE — ED Provider Notes (Signed)
History     CSN: [id number] Arrival date & time: 12/30/2010 12:52 PM   First MD Initiated Contact with Patient 12/30/10 1352      Chief Complaint  Patient presents with  . Chest Pain    (Consider location/radiation/quality/duration/timing/severity/associated sxs/prior treatment) HPI Patient presents with complaint of pain in her left chest and left shoulder. She states the pain is worsened by movement and palpation. It is especially worse when trying to lift her arm over her head. She does not know of any trauma. She does state at times pain feels like a pressure over her chest. There is no pain or discomfort when she is sitting or lying still. She's had no leg swelling, no recent travel, no history of blood clots. She does not know of any heart attack workup in the past. Symptoms are not associated with exertion. There's no diaphoresis no shortness of breath and no nausea associated. There no other associated systemic symptoms.  Past Medical History  Diagnosis Date  . Hypertension   . Diabetes mellitus   . High cholesterol     History reviewed. No pertinent past surgical history.  History reviewed. No pertinent family history.  History  Substance Use Topics  . Smoking status: Current Everyday Smoker -- 0.5 packs/day    Types: Cigarettes  . Smokeless tobacco: Not on file  . Alcohol Use: No    OB History    Grav Para Term Preterm Abortions TAB SAB Ect Mult Living                  Review of Systems ROS reviewed and otherwise negative except for mentioned in HPI  Allergies  Review of patient's allergies indicates no known allergies.  Home Medications   Current Outpatient Rx  Name Route Sig Dispense Refill  . AMLODIPINE BESYLATE 10 MG PO TABS Oral Take 10 mg by mouth at bedtime.      . ASPIRIN EC 81 MG PO TBEC Oral Take 81 mg by mouth daily.      . ATENOLOL 100 MG PO TABS Oral Take 100 mg by mouth every morning.      Marland Kitchen CLONIDINE HCL 0.1 MG PO TABS Oral Take 0.1 mg  by mouth 2 (two) times daily.      Marland Kitchen GEMFIBROZIL 600 MG PO TABS Oral Take 600 mg by mouth 2 (two) times daily.      Marland Kitchen HYDROCODONE-ACETAMINOPHEN 5-500 MG PO TABS Oral Take 1 tablet by mouth every 8 (eight) hours as needed. For pain     . LOSARTAN POTASSIUM 25 MG PO TABS Oral Take 25 mg by mouth daily.      Marland Kitchen METFORMIN HCL 500 MG PO TABS Oral Take 500 mg by mouth 2 (two) times daily with a meal.        BP 172/62  Pulse 61  Temp(Src) 97.5 F (36.4 C) (Oral)  Resp 16  SpO2 100% Vitals reviewed Physical Exam Physical Examination: General appearance - alert, well appearing, and in no distress Mental status - alert, oriented to person, place, and time Chest - clear to auscultation, no wheezes, rales or rhonchi, symmetric air entry, no tenderness to palpation of chest wall Heart - normal rate, regular rhythm, normal S1, S2, no murmurs, rubs, clicks or gallops Abdomen - soft, nontender, nondistended, no masses or organomegaly Neurological - alert, oriented, normal speech, no focal findings or movement disorder noted Musculoskeletal - pain with ROM of left shoulder, mild ttp over anterior shoulder, no deformity or swelling Extremities -  peripheral pulses normal, no pedal edema, no clubbing or cyanosis Skin - normal coloration and turgor  ED Course  Procedures (including critical care time)   Date: 12/30/2010  Rate: 59  Rhythm: sinus bradycardia  QRS Axis: normal  Intervals: normal  ST/T Wave abnormalities: normal  Conduction Disutrbances:none  Narrative Interpretation:   Old EKG Reviewed: unchanged compared to 01/03/10   Labs Reviewed  CBC - Abnormal; Notable for the following:    Platelets 126 (*)    All other components within normal limits  BASIC METABOLIC PANEL - Abnormal; Notable for the following:    Glucose, Bld 109 (*)    Calcium 10.7 (*)    GFR calc non Af Amer 89 (*)    All other components within normal limits  D-DIMER, QUANTITATIVE - Abnormal; Notable for the  following:    D-Dimer, Quant 1.06 (*)    All other components within normal limits  CARDIAC PANEL(CRET KIN+CKTOT+MB+TROPI)  POCT I-STAT TROPONIN I  POCT I-STAT TROPONIN I  I-STAT TROPONIN I  I-STAT TROPONIN I  I-STAT TROPONIN I   Dg Chest 2 View  12/30/2010  *RADIOLOGY REPORT*  Clinical Data: Left-sided chest pain, hypertension, diabetes  CHEST - 2 VIEW  Comparison: Chest x-ray of 01/03/2010  Findings: The lungs remain clear.  Cardiomegaly is stable. Considerable calcification of the aortic arch is again noted. Slight irregularity of the aortic arch may be due to atherosclerotic change, but there is slight bulging of the proximal descending thoracic aorta on the lateral view and a small aneurysm is a consideration. This appearance is more prominent than retrospective evaluation of chest x-ray of 2009. No acute bony abnormality is seen.  Surgical clips are present in the left upper quadrant.  CT of the chest with IV contrast media is recommended to assess further.  IMPRESSION:  1.  No active lung disease. 2.  Cannot exclude a small aneurysm of the proximal descending thoracic aorta.  Consider CT of the chest with IV contrast media.  Original Report Authenticated By: Joretta Bachelor, M.D.   Dg Shoulder Left  12/30/2010  *RADIOLOGY REPORT*  Clinical Data: Left shoulder pain.  No known injury.  LEFT SHOULDER - 2+ VIEW  Comparison: None.  Findings: Degenerative joint disease changes in the left AC joint and glenohumeral joint. No acute bony abnormality.  Specifically, no fracture, subluxation, or dislocation.  Soft tissues are intact.  IMPRESSION: Mild degenerative changes.  No acute findings.  Original Report Authenticated By: Raelyn Number, M.D.     No diagnosis found.  4:30pm report given to Delos Haring, PA, in CDU for further management- pt placed on chest pain protocol.  MDM  Patient presenting with chest pain as well as left shoulder pain. I suspect her shoulder pain to be musculoskeletal  in nature. The chest pain may be as well however she describes it as a pressure and has been intermittent. She denies having any prior cardiac workup. She does have a history of hypertension diabetes and high cholesterol. This makes her extremities or of 2. She was admitted to the CDU for observation on the chest pain protocol.        Threasa Beards, MD 12/30/10 2029

## 2010-12-30 NOTE — ED Notes (Signed)
Pt to cdu on cp protocol

## 2010-12-31 ENCOUNTER — Emergency Department (HOSPITAL_COMMUNITY): Payer: Medicare HMO

## 2010-12-31 ENCOUNTER — Other Ambulatory Visit: Payer: Self-pay

## 2010-12-31 LAB — GLUCOSE, CAPILLARY: Glucose-Capillary: 127 mg/dL — ABNORMAL HIGH (ref 70–99)

## 2010-12-31 MED ORDER — METOPROLOL TARTRATE 25 MG PO TABS
ORAL_TABLET | ORAL | Status: AC
  Administered 2010-12-31: 05:00:00
  Filled 2010-12-31: qty 2

## 2010-12-31 MED ORDER — IOHEXOL 350 MG/ML SOLN
75.0000 mL | Freq: Once | INTRAVENOUS | Status: AC | PRN
  Administered 2010-12-31: 75 mL via INTRAVENOUS

## 2010-12-31 MED ORDER — CLONIDINE HCL 0.1 MG PO TABS
0.1000 mg | ORAL_TABLET | Freq: Two times a day (BID) | ORAL | Status: DC
  Administered 2010-12-31: 0.1 mg via ORAL
  Filled 2010-12-31: qty 1

## 2010-12-31 MED ORDER — METFORMIN HCL 500 MG PO TABS
500.0000 mg | ORAL_TABLET | Freq: Once | ORAL | Status: AC
  Administered 2010-12-31: 500 mg via ORAL
  Filled 2010-12-31: qty 1

## 2010-12-31 MED ORDER — ASPIRIN 81 MG PO CHEW
81.0000 mg | CHEWABLE_TABLET | Freq: Once | ORAL | Status: AC
  Administered 2010-12-31: 81 mg via ORAL
  Filled 2010-12-31 (×2): qty 1

## 2010-12-31 MED ORDER — AMLODIPINE BESYLATE 10 MG PO TABS
10.0000 mg | ORAL_TABLET | Freq: Once | ORAL | Status: AC
  Administered 2010-12-31: 10 mg via ORAL
  Filled 2010-12-31: qty 1

## 2010-12-31 MED ORDER — GEMFIBROZIL 600 MG PO TABS
600.0000 mg | ORAL_TABLET | ORAL | Status: DC
  Administered 2010-12-31: 600 mg via ORAL
  Filled 2010-12-31: qty 1

## 2010-12-31 MED ORDER — LOSARTAN POTASSIUM 25 MG PO TABS
25.0000 mg | ORAL_TABLET | Freq: Every day | ORAL | Status: DC
  Administered 2010-12-31: 25 mg via ORAL
  Filled 2010-12-31: qty 1

## 2010-12-31 NOTE — ED Provider Notes (Signed)
Medical screening examination/treatment/procedure(s) were performed by non-physician practitioner and as supervising physician I was immediately available for consultation/collaboration.   Maudry Diego, MD 12/31/10 1500

## 2010-12-31 NOTE — ED Provider Notes (Signed)
Medical screening examination/treatment/procedure(s) were conducted as a shared visit with non-physician practitioner(s) and myself.  I personally evaluated the patient during the encounter  Threasa Beards, MD 12/31/10 [phone]

## 2010-12-31 NOTE — ED Notes (Signed)
Pt called nursing to room pt, she has a very agitated tone with staff wanting to know how much longer it will be until her study. Told pt as soon as i had a second i would be calling to find out when exactly the testing would be. Pt then became loud with threatening tone with staff pa in room as witness.... Pt agitated stating she is hungry and tired of being in the hospital . That she doesn't want to be here. Pt has family member at bedside. Spoke with kelly in ct. She advises it will be 09:55

## 2010-12-31 NOTE — ED Notes (Signed)
EkG given to EDP

## 2010-12-31 NOTE — Consults (Signed)
Admit date: 12/30/2010 Referring Physician Domenic Moras, PA Primary Physician Dr. Antony [facility]) Primary Cardiologist  None (new Candee Furbish) Reason for Consultation  evaluation of chest pain, significant calcification on coronary angiography, aortic ulceration  HPI: 65 year old female with diabetes, hypertension, hyperlipidemia who presented to the [facility] emergency department with atypical chest pain. She describes left shoulder pain that was worse with raising her arm above her head and created a popping sensation which then radiated to her left chest wall. She was concerned that this was her heart. She was observed and all cardiac biomarkers were negative. A d-dimer was checked and this demonstrated an elevation therefore a CT angiogram was performed which showed no evidence of aortic dissection however this did show a 7 mm thrombosed penetrating atherosclerotic ulcer within the superior aspect of the descending aorta as well as extensive coronary artery calcification. There was no evidence of pulmonary embolism. Her EKG personally viewed showed sinus rhythm with left ventricular hypertrophy and nonspecific ST-T wave changes, repolarization changes. When I examined her, she stated that she was having no chest discomfort, no shortness of breath, no diaphoresis. Her original pain was not associated with any diaphoresis, nausea, syncope, palpitations. She's had no prior cardiovascular illness.    PMH:   Past Medical History  Diagnosis Date  . Hypertension   . Diabetes mellitus   . High cholesterol     PSH:  History reviewed. No pertinent past surgical history. Allergies:  Review of patient's allergies indicates no known allergies. Prior to Admit Meds:  cloNIDine (CATAPRES) tablet 0.1 mg, PO, BID Given: 11/29 1246 gemfibrozil (LOPID) tablet 600 mg 600 mg, PO, 1 day or 1 dose Given: 11/29 1246 losartan (COZAAR) tablet 25 mg, PO, Daily  Fam HX:   History reviewed. No pertinent family  history. Social HX:    History   Social History  . Marital Status: Divorced    Spouse Name: N/A    Number of Children: N/A  . Years of Education: N/A   Occupational History  . Not on file.   Social History Main Topics  . Smoking status: Current Everyday Smoker -- 0.5 packs/day    Types: Cigarettes  . Smokeless tobacco: Not on file  . Alcohol Use: No  . Drug Use: No  . Sexually Active:    Other Topics Concern  . Not on file   Social History Narrative  . No narrative on file     ROS:  All 11 ROS were addressed and are negative except what is stated in the HPI  Physical Exam: Blood pressure 213/89, pulse 66, temperature 97.6 F (36.4 C), temperature source Oral, resp. rate 20, height 5\' 5"  (1.651 m), weight 64.864 kg (143 lb), SpO2 100.00%.    General: Well developed, well nourished, in no acute distress Head: Eyes PERRLA, No xanthomas.   Normal cephalic and atramatic  Lungs:   Clear bilaterally to auscultation and percussion. Normal respiratory effort. No wheezes, no rales. Heart:   HRRR S1 S2 Pulses are 2+ & equal.            No carotid bruit. No JVD.  No abdominal bruits. No femoral bruits. Abdomen: Bowel sounds are positive, abdomen soft and non-tender without masses or                  Hernia's noted. No hepatosplenomegaly. Msk:  Back normal, normal gait. Normal strength and tone for age. Extremities:   No clubbing, cyanosis or edema.  DP +1 Neuro: Alert  and oriented X 3, non-focal, MAE x 4 GU: Deferred Rectal: Deferred Psych:  Good affect, responds appropriately    Labs:   Lab Results  Component Value Date   WBC 5.6 12/30/2010   HGB 13.3 12/30/2010   HCT 39.3 12/30/2010   MCV 87.9 12/30/2010   PLT 126* 12/30/2010    Lab 12/30/10 1427  NA 142  K 4.3  CL 107  CO2 25  BUN 16  CREATININE 0.73  CALCIUM 10.7*  PROT --  BILITOT --  ALKPHOS --  ALT --  AST --  GLUCOSE 109*   No results found for this basename: PTT   Lab Results  Component Value  Date   INR 0.90 01/16/2010   INR 0.9 08/25/2007   INR 0.8 02/03/2007   Lab Results  Component Value Date   CKTOTAL 30 12/30/2010   CKMB 1.7 12/30/2010   TROPONINI <0.30 12/30/2010        Radiology:  Dg Chest 2 View  12/30/2010  *RADIOLOGY REPORT*  Clinical Data: Left-sided chest pain, hypertension, diabetes  CHEST - 2 VIEW  Comparison: Chest x-ray of 01/03/2010  Findings: The lungs remain clear.  Cardiomegaly is stable. Considerable calcification of the aortic arch is again noted. Slight irregularity of the aortic arch may be due to atherosclerotic change, but there is slight bulging of the proximal descending thoracic aorta on the lateral view and a small aneurysm is a consideration. This appearance is more prominent than retrospective evaluation of chest x-ray of 2009. No acute bony abnormality is seen.  Surgical clips are present in the left upper quadrant.  CT of the chest with IV contrast media is recommended to assess further.  IMPRESSION:  1.  No active lung disease. 2.  Cannot exclude a small aneurysm of the proximal descending thoracic aorta.  Consider CT of the chest with IV contrast media.  Original Report Authenticated By: Joretta Bachelor, M.D.   Ct Angio Chest W/cm &/or Wo Cm  12/31/2010  *RADIOLOGY REPORT*  Clinical Data:  Left upper chest and shoulder pain, evaluate for dissection and/or pulmonary embolism  CT ANGIOGRAPHY CHEST WITH AND WITHOUT CONTRAST  Technique:  Multidetector CT imaging of the chest was performed using the standard protocol during bolus administration of intravenous contrast.  Multiplanar CT image reconstructions including MIPs were obtained to evaluate the vascular anatomy.  Contrast: 34mL OMNIPAQUE IOHEXOL 350 MG/ML IV SOLN  Comparison:  None  Findings:  The noncontrast images are negative for intramural hematoma.  There is extensive calcified and noncalcified atherosclerotic plaque within a normal caliber thoracic aorta.  No definite evidence of acute aortic  dissection.  There is a focal approximate 7 mm outpouching of the lateral wall of the thoracic aorta distal to the takeoff of the left subclavian artery which does not opacify with contrast and is favored to represent a thrombosed penetrating atherosclerotic ulcer.  This finding is without associated adjacent periaortic stranding.  There is extensive irregular intraluminal predominately noncalcified atherosclerotic plaque within the thoracic aorta.  Normal configuration of the aortic arch. There are extensive atherosclerotic calcification at the ostium of the major branch vessels of the aortic arch, in particular, the left subclavian artery, however these are not felt to be hemodynamically significant and the visualized portions of the cervical vasculature are patent.  Although this examination was not tailored for evaluation of the pulmonary arteries, there is no discrete filling defect within the main, right, or Baker's segmental branch of the pulmonary arteries. The caliber of the  main pulmonary artery is normal.  Review of the MIP images confirms the above findings.  There are minimal ground-glass opacities within the bilateral lung bases favored to represent atelectasis.  No focal airspace opacities.  No mediastinal, hilar or axillary lymphadenopathy.  No pleural effusion or pneumothorax.  The heart is enlarged in particular, the left ventricle.  Coronary artery calcifications.  No pericardial effusion.  Shoddy mediastinal lymph nodes are not enlarged by CT criteria.  No focal airspace opacities.  Limited early arterial phase evaluation of the upper abdomen demonstrates multiple surgical clips of the left upper abdominal quadrant.  No acute or aggressive osseous abnormalities.  IMPRESSION:  1.  Extensive calcified and noncalcified atherosclerotic plaque within a normal caliber thoracic aorta.  No evidence of acute aortic dissection.  2.  Thrombosed approximately 7 mm penetrating atherosclerotic ulcer within the  superior aspect of the descending thoracic aorta.  3.  Cardiomegaly with extensive coronary artery calcifications.  4.  Although this examination was not tailored for the evaluation of the pulmonary arteries, there is no evidence of pulmonary embolism to the level of the bilateral segmental pulmonary arteries.  Original Report Authenticated By: Rachel Moulds, M.D.   Dg Shoulder Left  12/30/2010  *RADIOLOGY REPORT*  Clinical Data: Left shoulder pain.  No known injury.  LEFT SHOULDER - 2+ VIEW  Comparison: None.  Findings: Degenerative joint disease changes in the left AC joint and glenohumeral joint. No acute bony abnormality.  Specifically, no fracture, subluxation, or dislocation.  Soft tissues are intact.  IMPRESSION: Mild degenerative changes.  No acute findings.  Original Report Authenticated By: Raelyn Number, M.D.    EKG:  Sinus rhythm, no ST segment changes concerning for ischemia, LVH noted. Personally reviewed. Compared to old there is no change.  ASSESSMENT: 65 year old with diabetes, hypertension, hyperlipidemia with significant coronary artery calcification, aortic ulcerated penetrating atherosclerosis with atypical chest pain.  PLAN:  Her chest pain is atypical and likely musculoskeletal resulting from her left shoulder. She is not having any exertional anginal symptoms or worsening shortness of breath/diaphoresis. Her aortic finding as well as coronary calcification was incidental when looking for pulmonary embolism which was negative. She has extensive coronary artery disease/calcification I will set her up for an outpatient nuclear stress test, pharmacologic. She will consult with Dr. Chapman Fitch her primary care physician about her left shoulder discomfort. I recommended Tylenol. In regards to her hyperlipidemia, continue with aggressive management. In regards to her hypertension, continue with aggressive management. I recommended aspirin therapy daily. I will see her in followup.  Candee Furbish, MD  12/31/2010  1:36 PM

## 2010-12-31 NOTE — ED Notes (Signed)
ecg performed and given to MD.

## 2010-12-31 NOTE — ED Provider Notes (Signed)
This is a 65 year old female who presents to the ED with chief complaints of intermittent left chest and left shoulder pain. She has been evaluated and were placed in the CDU under a chest pain protocol, due to having no prior cardiology workup, and a TIMI score of 2. She is currently awaiting a CT coronary angiogram.  9:44 AM Patient has an elevated d-dimer of 1.06. She also had a chest x-ray that can not exclude a small aneurysm of the proximal descending thoracic aorta. She does presents to the ED with chief complaint of chest pain that radiated to her back and her left shoulder. At this time, I cannot exclude a possible PE. I will consult with the radiology to determine the appropriate imaging as the patient is currently waiting to have a coronary CT.  9:52 AM I have consult with the radiologist Dr. Ardeen Garland. Dr. Ardeen Garland states he will be unlikely to perform both a coronary CT and to rule out dissection. With an elevated d-dimer and possible signs of aneurysm on chest x-ray, I am more concerning of aneurysm/dissection or PE over cardiac etiology. She has normal troponin serially. His chest CTA shows no significant finding consistent with the symptoms, a coronary CT can be performed at a later time.  11:32 AM CTA  of the chest from today shows extensive calcified and noncalcified atherosclerosis plaque within a normal caliber thoracic aorta with no evidence of acute aortic dissection. There is a thrombosed approximately 7 mm penetrating arthrosclerosis ulcer within the superior aspect of the descending thoracic aorta the there is cardiomegaly with extensive coronary artery calcification. No evidence of PE. I have consult with the radiologist, who believes Pt will likely have significant coronary calcification on coronary CT.  He recommends consulting cardiology for further management.   11:47 AM I've spoken with Dr. Daymon Larsen, cardiologist, who has reviewed the CT scan results and lab results. He will be seeing  the patient in the ED and will formulate a plan. Patient is made aware and agree with plan.  12:25 PM Dr. Daymon Larsen has seen the patient and recommend for her to be discharged. His office will call and set up an appointment for her to followup. She would be likely need nuclear medicine study for further evaluation. She will be continued taking the blood pressure medication along with the aspirin and her cholesterol medication. Patient is recommended to take her regular pain medication for her shoulder pain. She is to follow up with her primary care Dr. for further management. Patient is currently in no acute distress, and her vital signs are stable.  Domenic Moras, PA 12/31/10 1259

## 2011-01-04 ENCOUNTER — Encounter: Payer: Self-pay | Admitting: Vascular Surgery

## 2011-01-08 NOTE — Progress Notes (Signed)
Observation review for 11/28 is complete.

## 2011-01-27 ENCOUNTER — Other Ambulatory Visit: Payer: Self-pay

## 2011-01-27 DIAGNOSIS — I6529 Occlusion and stenosis of unspecified carotid artery: Secondary | ICD-10-CM

## 2011-02-05 ENCOUNTER — Encounter: Payer: Medicare HMO | Admitting: Vascular Surgery

## 2011-02-05 ENCOUNTER — Other Ambulatory Visit: Payer: Medicare HMO

## 2011-03-11 ENCOUNTER — Encounter: Payer: Self-pay | Admitting: Vascular Surgery

## 2011-03-12 ENCOUNTER — Encounter: Payer: Self-pay | Admitting: Vascular Surgery

## 2011-03-12 ENCOUNTER — Other Ambulatory Visit (INDEPENDENT_AMBULATORY_CARE_PROVIDER_SITE_OTHER): Payer: Medicare HMO | Admitting: *Deleted

## 2011-03-12 ENCOUNTER — Ambulatory Visit (INDEPENDENT_AMBULATORY_CARE_PROVIDER_SITE_OTHER): Payer: Medicare HMO | Admitting: Vascular Surgery

## 2011-03-12 VITALS — BP 161/71 | HR 61 | Resp 16 | Ht 66.0 in | Wt 138.0 lb

## 2011-03-12 DIAGNOSIS — I6529 Occlusion and stenosis of unspecified carotid artery: Secondary | ICD-10-CM

## 2011-03-12 NOTE — Progress Notes (Signed)
[facility] [place]  New Carotid Patient  Referred by:  Kathrene Bongo, MD [place], [place] [address],  Reason for referral: B carotid stenosis  History of Present Illness  Kristen Bradley is a 66 y.o. female who presents with chief complaint: B carotid stenosis.   Previous carotid studies demonstrated: RICA 0000000 stenosis, LICA 0000000 stenosis.  Patient has no history of TIA or stroke symptom.  The patient has never had amaurosis fugax or monocular blindness.  The patient has never had facial drooping or hemiplegia.  The patient has never had receptive or expressive aphasia.   The patient's risks factors for carotid disease include: HTN, DM, Hypercholesterolism, and active smoking.  Past Medical History  Diagnosis Date  . Hypertension   . Diabetes mellitus   . High cholesterol     No past surgical history on file.  History   Social History  . Marital Status: Divorced    Spouse Name: N/A    Number of Children: N/A  . Years of Education: N/A   Occupational History  . Not on file.   Social History Main Topics  . Smoking status: Current Everyday Smoker -- 0.5 packs/day    Types: Cigarettes  . Smokeless tobacco: Not on file  . Alcohol Use: No  . Drug Use: No  . Sexually Active:    Other Topics Concern  . Not on file   Social History Narrative  . No narrative on file    No family history on file.  Current Outpatient Prescriptions on File Prior to Visit  Medication Sig Dispense Refill  . amLODipine (NORVASC) 10 MG tablet Take 10 mg by mouth at bedtime.        Marland Kitchen aspirin EC 81 MG tablet Take 81 mg by mouth daily.        Marland Kitchen atenolol (TENORMIN) 100 MG tablet Take 100 mg by mouth every morning.        . cloNIDine (CATAPRES) 0.1 MG tablet Take 0.1 mg by mouth 2 (two) times daily.        Marland Kitchen gemfibrozil (LOPID) 600 MG tablet Take 600 mg by mouth 2 (two) times daily.        Marland Kitchen HYDROcodone-acetaminophen (VICODIN) 5-500 MG per tablet  Take 1 tablet by mouth every 8 (eight) hours as needed. For pain       . losartan (COZAAR) 25 MG tablet Take 25 mg by mouth daily.        . metFORMIN (GLUCOPHAGE) 500 MG tablet Take 500 mg by mouth 2 (two) times daily with a meal.          No Known Allergies  REVIEW OF SYSTEMS:  (Positives checked otherwise negative)  CARDIOVASCULAR: [ ]  chest pain    [ ]  chest pressure    [ ]  palpitations    [ ]  orthopnea   [ ]  dyspnea on exert. [x]  claudication    [ ]  rest pain     [ ]  DVT     [ ]  phlebitis  PULMONARY:    [ ]  productive cough [ ]  asthma  [ ]  wheezing  NEUROLOGIC:    [ ]  weakness    [ ]  paresthesias   [ ]  aphasia    [ ]  amaurosis    [ ]  dizziness  HEMATOLOGIC:    [ ]  bleeding problems  [ ]  clotting disorders  MUSCULOSKEL: [ ]  joint pain     [ ]  joint swelling  GASTROINTEST:  [ ]   blood in stool   [ ]   hematemesis  GENITOURINARY:   [ ]   dysuria    [ ]   hematuria  PSYCHIATRIC:   [ ]  history of major depression  INTEGUMENTARY: [ ]  rashes    [ ]  ulcers  CONSTITUTIONAL:  [ ]  fever     [ ]  chills  Physical Examination  Filed Vitals:   03/12/11 1149 03/12/11 1150  BP: 176/74 161/71  Pulse: 67 61  Resp: 16   Height: 5\' 6"  (1.676 m)   Weight: 138 lb (62.596 kg)    Body mass index is 22.27 kg/(m^2).  General: A&O x 3, WDWN  Head: [place]/AT  Ear/Nose/Throat: Hearing grossly intact, nares w/o erythema or drainage, oropharynx w/o Erythema/Exudate  Eyes: PERRLA, EOMI  Neck: Supple, no nuchal rigidity, no palpable LAD  Pulmonary: Sym exp, good air movt, CTAB, no rales, rhonchi, & wheezing  Cardiac: RRR, Nl S1, S2, no Murmurs, rubs or gallops  Vascular: Vessel Right Left  Radial Palpable Palpable  Brachial Palpable Palpable  Carotid Palpable, without bruit Palpable, witht bruit  Aorta Non-palpable N/A  Femoral Palpable Palpable  Popliteal Non-palpable Non-palpable  PT Not examined (per pt) Not examined (per pt)  DP Not examined (per pt) Not examined (per pt)    Gastrointestinal: soft, NTND, -G/R, - HSM, - masses, - CVAT B   Musculoskeletal: M/S 5/5 throughout , Extremities without ischemic changes   Neurologic: CN 2-12 intact , Pain and light touch intact in extremities , Motor exam as listed above  Psychiatric: Judgment intact, Mood & affect appropriate for pt's clinical situation  Dermatologic: See M/S exam for extremity exam, no rashes otherwise noted  Lymph : No Cervical, Axillary, or Inguinal lymphadenopathy   Non-Invasive Vascular Imaging  CAROTID DUPLEX (Date: 03/12/11):   R ICA stenosis: 60-79%, 276/64 c/s  R VA: patent and antegrade  L ICA stenosis: 60-79%, 259/77 c/s  L VA: patent and antegrade  Outside Studies/Documentation 10 pages of outside documents were reviewed including: outside carotid duplex and outpatient chart.  Medical Decision Making  Kristen Bradley is a 66 y.o. female who presents with: B ICA stenosis 60-79%   Based on the patient's vascular studies and examination, I have offered the patient: B carotid artery surveillance q6 month.  I discussed in depth with the patient the nature of atherosclerosis, and emphasized the importance of maximal medical management including strict control of blood pressure, blood glucose, and lipid levels, obtaining regular exercise, antiplatelet agents, and cessation of smoking.  The patient is aware that without maximal medical management the underlying atherosclerotic disease process will progress, limiting the benefit of any interventions.  I reiterated the importance of smoking cessation and she will try to quit on her own.  Thank you for allowing [place] to participate in this patient's care.  Adele Barthel, MD [facility] Office: [phone] Pager: [phone]  03/12/2011, 12:53 PM

## 2011-03-22 NOTE — Procedures (Unsigned)
CAROTID DUPLEX EXAM  INDICATION:  Followup carotid stenosis  HISTORY: Diabetes:  Yes Cardiac:  No Hypertension:  Yes Smoking:  Yes Previous Surgery:  Stents in legs CV History:  Asymptomatic Amaurosis Fugax No, Paresthesias No, Hemiparesis No                                      RIGHT             LEFT Brachial systolic pressure:         200               192 Brachial Doppler waveforms:         Triphasic         Triphasic Vertebral direction of flow:        Antegrade         Antegrade DUPLEX VELOCITIES (cm/sec) CCA peak systolic                   205               [id number] ECA peak systolic                   184               0000000 ICA peak systolic                   276               [id number] ICA end diastolic                   64                77 PLAQUE MORPHOLOGY:                  Mixed, irregular  Mixed, irregular PLAQUE AMOUNT:                      Moderate to severe                  Moderate PLAQUE LOCATION:                    CCA, bif, ICA, ECA                  CCA, bif, ICA, ECA  IMPRESSION: 1. 60%-79% bilateral internal carotid artery stenosis, upper end of     range. 2. Vertebral artery flow antegrade bilaterally.  ___________________________________________ Conrad Lake [place]wood, MD  SS/MEDQ  D:  03/12/2011  T:  03/12/2011  Job:  [id number]

## 2011-07-19 ENCOUNTER — Other Ambulatory Visit: Payer: Self-pay | Admitting: Family Medicine

## 2011-07-19 DIAGNOSIS — Z853 Personal history of malignant neoplasm of breast: Secondary | ICD-10-CM

## 2011-09-10 ENCOUNTER — Other Ambulatory Visit: Payer: Medicare HMO

## 2011-09-14 ENCOUNTER — Ambulatory Visit
Admission: RE | Admit: 2011-09-14 | Discharge: 2011-09-14 | Disposition: A | Discharge status: Home / Self Care | Payer: Medicare HMO | Source: Ambulatory Visit | Attending: Family Medicine | Admitting: Family Medicine

## 2011-09-14 DIAGNOSIS — Z853 Personal history of malignant neoplasm of breast: Secondary | ICD-10-CM

## 2011-10-06 ENCOUNTER — Ambulatory Visit: Payer: Medicare HMO | Admitting: Neurosurgery

## 2011-10-06 ENCOUNTER — Other Ambulatory Visit: Payer: Self-pay

## 2011-10-06 ENCOUNTER — Other Ambulatory Visit: Payer: Medicare HMO

## 2011-10-06 DIAGNOSIS — I70219 Atherosclerosis of native arteries of extremities with intermittent claudication, unspecified extremity: Secondary | ICD-10-CM

## 2011-11-04 ENCOUNTER — Encounter: Payer: Self-pay | Admitting: Vascular Surgery

## 2011-11-05 ENCOUNTER — Encounter (INDEPENDENT_AMBULATORY_CARE_PROVIDER_SITE_OTHER): Payer: Medicare HMO | Admitting: *Deleted

## 2011-11-05 ENCOUNTER — Other Ambulatory Visit: Payer: Self-pay | Admitting: Vascular Surgery

## 2011-11-05 ENCOUNTER — Ambulatory Visit (INDEPENDENT_AMBULATORY_CARE_PROVIDER_SITE_OTHER): Payer: Medicare HMO | Admitting: Vascular Surgery

## 2011-11-05 ENCOUNTER — Other Ambulatory Visit (INDEPENDENT_AMBULATORY_CARE_PROVIDER_SITE_OTHER): Payer: Medicare HMO | Admitting: *Deleted

## 2011-11-05 ENCOUNTER — Encounter: Payer: Self-pay | Admitting: Vascular Surgery

## 2011-11-05 VITALS — BP 153/73 | HR 57 | Resp 16 | Ht 66.0 in | Wt 142.9 lb

## 2011-11-05 DIAGNOSIS — Z01810 Encounter for preprocedural cardiovascular examination: Secondary | ICD-10-CM

## 2011-11-05 DIAGNOSIS — I739 Peripheral vascular disease, unspecified: Secondary | ICD-10-CM

## 2011-11-05 DIAGNOSIS — I658 Occlusion and stenosis of other precerebral arteries: Secondary | ICD-10-CM

## 2011-11-05 DIAGNOSIS — I6523 Occlusion and stenosis of bilateral carotid arteries: Secondary | ICD-10-CM

## 2011-11-05 DIAGNOSIS — I70219 Atherosclerosis of native arteries of extremities with intermittent claudication, unspecified extremity: Secondary | ICD-10-CM

## 2011-11-05 DIAGNOSIS — I6529 Occlusion and stenosis of unspecified carotid artery: Secondary | ICD-10-CM | POA: Insufficient documentation

## 2011-11-05 HISTORY — DX: Occlusion and stenosis of bilateral carotid arteries: I65.23

## 2011-11-05 HISTORY — DX: Peripheral vascular disease, unspecified: I73.9

## 2011-11-05 LAB — CREATININE, SERUM: Creat: 0.82 mg/dL (ref 0.50–1.10)

## 2011-11-05 MED ORDER — DIAZEPAM 2 MG PO TABS: 2.0000 mg | ORAL_TABLET | Freq: Once | ORAL | Status: DC

## 2011-11-05 NOTE — Progress Notes (Signed)
[facility] [place]  Referred by:  Antony Blackbird, MD [place], [place] [address],[place] [address],  Reason for referral: B leg cramping  History of Present Illness  Kristen Bradley is a 66 y.o. (10/02/1945) female who presents with chief complaint: B leg pain.  Onset of symptom occurred one year ago, without an obvious trigger.  She notes at her previous evaluation here at [facility] she already had some sx but wasn't that bad.  Pain is described as cramping in calves with walking, severity 3-6/10.  Patient has attempted to treat this pain with rest.  The patient has no rest pain symptoms also and no leg wounds/ulcers.  She also returns for routine surveillance of her BICA stenosis.  She denies any stroke or TIA sx.  Atherosclerotic risk factors include: HTN, DM, hypercholesterolemia, and smoking.  Past Medical History  Diagnosis Date  . Hypertension   . Diabetes mellitus   . High cholesterol     History reviewed. No pertinent past surgical history.  History   Social History  . Marital Status: Divorced    Spouse Name: N/A    Number of Children: N/A  . Years of Education: N/A   Occupational History  . Not on file.   Social History Main Topics  . Smoking status: Current Every Day Smoker -- 0.5 packs/day    Types: Cigarettes  . Smokeless tobacco: Not on file  . Alcohol Use: No  . Drug Use: No  . Sexually Active:    Other Topics Concern  . Not on file   Social History Narrative  . No narrative on file    Family History  Problem Relation Age of Onset  . Cancer Mother   . Cancer Father   . Diabetes Sister   . Diabetes Brother     Current Outpatient Prescriptions on File Prior to Visit  Medication Sig Dispense Refill  . Alcohol Swabs (B-D SINGLE USE SWABS REGULAR) PADS       . amLODipine (NORVASC) 10 MG tablet Take 10 mg by mouth at bedtime.        Marland Kitchen aspirin EC 81 MG tablet Take 81 mg by mouth daily.        Marland Kitchen atenolol (TENORMIN) 100 MG tablet Take  100 mg by mouth every morning.        . cloNIDine (CATAPRES) 0.1 MG tablet Take 0.1 mg by mouth 2 (two) times daily.        Marland Kitchen gemfibrozil (LOPID) 600 MG tablet Take 600 mg by mouth 2 (two) times daily.        . hydrochlorothiazide (HYDRODIURIL) 12.5 MG tablet 25 mg daily.      Marland Kitchen HYDROcodone-acetaminophen (VICODIN) 5-500 MG per tablet Take 1 tablet by mouth every 8 (eight) hours as needed. For pain       . losartan (COZAAR) 100 MG tablet       . losartan (COZAAR) 25 MG tablet Take 25 mg by mouth daily.        . metFORMIN (GLUCOPHAGE) 500 MG tablet Take 500 mg by mouth 2 (two) times daily with a meal.        . metFORMIN (GLUCOPHAGE-XR) 500 MG 24 hr tablet       . simvastatin (ZOCOR) 20 MG tablet         No Known Allergies   REVIEW OF SYSTEMS:  (Positives checked otherwise negative)  CARDIOVASCULAR:  [ ]  chest pain, [ ]  chest pressure, [ ]  palpitations, [ ]   shortness of breath when laying flat, [ ]  shortness of breath with exertion,   [x]  pain in feet when walking, [ ]  pain in feet when laying flat, [ ]  history of blood clot in veins (DVT), [ ]  history of phlebitis, [ ]  swelling in legs, [ ]  varicose veins  PULMONARY:  [ ]  productive cough, [ ]  asthma, [ ]  wheezing  NEUROLOGIC:  [ ]  weakness in arms or legs, [ ]  numbness in arms or legs, [ ]  difficulty speaking or slurred speech, [ ]  temporary loss of vision in one eye, [ ]  dizziness  HEMATOLOGIC:  [ ]  bleeding problems, [ ]  problems with blood clotting too easily  MUSCULOSKEL:  [ ]  joint pain, [ ]  joint swelling  GASTROINTEST:  [ ]   Vomiting blood, [ ]   Blood in stool     GENITOURINARY:  [ ]   Burning with urination, [ ]   Blood in urine  PSYCHIATRIC:  [ ]  history of major depression  INTEGUMENTARY:  [ ]  rashes, [ ]  ulcers  CONSTITUTIONAL:  [ ]  fever, [ ]  chills  Physical Examination  Filed Vitals:   11/05/11 1106 11/05/11 1108  BP: 181/75 153/73  Pulse: 59 57  Resp: 16   Height: 5\' 6"  (1.676 m)   Weight: 142 lb 14.4 oz  (64.819 kg)   SpO2: 100%    Body mass index is 23.06 kg/(m^2).  General: A&O x 3, WDWN  Head: [place]/AT  Ear/Nose/Throat: Hearing grossly intact, nares w/o erythema or drainage, oropharynx w/o Erythema/Exudate  Eyes: PERRLA, EOMI  Neck: Supple, no nuchal rigidity, no palpable LAD  Pulmonary: Sym exp, good air movt, CTAB, no rales, rhonchi, & wheezing  Cardiac: RRR, Nl S1, S2, no Murmurs, rubs or gallops  Vascular: Vessel Right Left  Radial Palpable Palpable  Ulnar Palpable Palpable  Brachial Palpable Palpable  Carotid Palpable, without bruit Palpable, without bruit  Aorta Not palpable N/A  Femoral Palpable Palpable  Popliteal Not palpable Not palpable  PT Not Palpable Not Palpable  DP Not Palpable Not Palpable   Gastrointestinal: soft, NTND, -G/R, - HSM, - masses, - CVAT B  Musculoskeletal: M/S 5/5 throughout , Extremities without ischemic changes , legs warm to knee, cool feet  Neurologic: CN 2-12 intact , Pain and light touch intact in extremities , Motor exam as listed above  Psychiatric: Judgment intact, Mood & affect appropriatefor pt's clinical situation  Dermatologic: See M/S exam for extremity exam, no rashes otherwise noted  Lymph : No Cervical, Axillary, or Inguinal lymphadenopathy   Non-Invasive Vascular Imaging  ABI (Date: 11/05/11)  RLE: 0.58, PT and DP: monophasic, DBI 0.29  LLE: 0.54, PT and P: monophasic, DBI 0.45  CAROTID DUPLEX (Date: 11/05/11):   R ICA stenosis: 80-99%, high bifurcation  R VA: patent and antegrade  L ICA stenosis: 60-79%  L VA: patent and antegrade  Medical Decision Making  Kristen Bradley is a 66 y.o. female who presents with: asx RICA high grade stenosis, asx LICA stenosis < 123XX123, BLE intermittent claudication.   In regards to the RICA stenosis, this is a marked increase, so I think a repeat study with a CTA neck is recommended.  Also with the high bifurcation on duplex, evaluation with CTA is recommended to evaluate the  aortic arch also to see if this pt is a candidate for CAS.  She will follow up in 2 weeks with this study.  I did stress to the patient the importance of hydration before the procedure and continue water  intake after the CTA..  I discussed with the patient the natural history of intermittent claudication: 75% of patients have stable or improved symptoms in a year an only 2% require amputation. Eventually 20% may require intervention in a year.  I discussed in depth with the patient the nature of atherosclerosis, and emphasized the importance of maximal medical management including strict control of blood pressure, blood glucose, and lipid levels, antiplatelet agent, obtaining regular exercise, and cessation of smoking.    The patient is aware that without maximal medical management the underlying atherosclerotic disease process will progress, limiting the benefit of any interventions.  I discussed in depth with the patient a walking plan and how to execute such.  The patient is not interested in starting Pletal.  As she is diabetic, I have some concern that her PAD is worse than the ABI suggest as medial calcification can elevate ABI.  At this point, she does NOT feel her intermittent claudication is lifestyle limiting.  We will continue to follow her PAD with routine ABI.    Increase in sx, development of wounds, or decreased in ABI are all triggers for proceeding with an Aortogram, bilateral leg runoff, and possible intervention.  Thank you for allowing [place] to participate in this patient's care.  Adele Barthel, MD [facility] [place] [place] [facility] Office: [phone] Pager: [phone]  11/05/2011, 12:22 PM

## 2011-11-05 NOTE — Addendum Note (Signed)
Addended by: Mena Goes on: 11/05/2011 01:47 PM   Modules accepted: Orders

## 2011-11-25 ENCOUNTER — Encounter: Payer: Self-pay | Admitting: Vascular Surgery

## 2011-11-26 ENCOUNTER — Encounter: Payer: Self-pay | Admitting: Vascular Surgery

## 2011-11-26 ENCOUNTER — Ambulatory Visit
Admission: RE | Admit: 2011-11-26 | Discharge: 2011-11-26 | Disposition: A | Discharge status: Home / Self Care | Payer: Medicare HMO | Source: Ambulatory Visit | Attending: Vascular Surgery | Admitting: Vascular Surgery

## 2011-11-26 ENCOUNTER — Ambulatory Visit (INDEPENDENT_AMBULATORY_CARE_PROVIDER_SITE_OTHER): Payer: Medicare HMO | Admitting: Vascular Surgery

## 2011-11-26 VITALS — BP 156/66 | HR 89 | Resp 18 | Ht 65.0 in | Wt 143.0 lb

## 2011-11-26 DIAGNOSIS — I6529 Occlusion and stenosis of unspecified carotid artery: Secondary | ICD-10-CM

## 2011-11-26 DIAGNOSIS — Z01818 Encounter for other preprocedural examination: Secondary | ICD-10-CM

## 2011-11-26 DIAGNOSIS — I739 Peripheral vascular disease, unspecified: Secondary | ICD-10-CM

## 2011-11-26 DIAGNOSIS — Z01810 Encounter for preprocedural cardiovascular examination: Secondary | ICD-10-CM

## 2011-11-26 DIAGNOSIS — I6523 Occlusion and stenosis of bilateral carotid arteries: Secondary | ICD-10-CM

## 2011-11-26 HISTORY — DX: Peripheral vascular disease, unspecified: I73.9

## 2011-11-26 MED ORDER — IOHEXOL 350 MG/ML SOLN
100.0000 mL | Freq: Once | INTRAVENOUS | Status: AC | PRN
  Administered 2011-11-26: 100 mL via INTRAVENOUS

## 2011-11-26 NOTE — Progress Notes (Signed)
[facility] [place]  Established Carotid Patient  History of Present Illness  Kristen Bradley is a 66 y.o. (1945-06-06) female who presents with chief complaint: follow up on neck CTA.  She denies any complications from the CTA.  The patient has had no stroke or TIA sx since we last met.  Additionally, she notes her claudication sx are stable.    Past Medical History, Past Surgical History, Social History, Family History, Medications, Allergies, and Review of Systems are unchanged from previous evaluation on 11/05/11.  Physical Examination  Filed Vitals:   11/26/11 1400  BP: 156/66  Pulse: 89  Resp: 18  Height: 5\' 5"  (1.651 m)  Weight: 143 lb (64.864 kg)   Body mass index is 23.80 kg/(m^2).  General: A&O x 3, WDWN   Pulmonary: Sym exp, good air movt, CTAB, no rales, rhonchi, & wheezing   Cardiac: RRR, Nl S1, S2, no Murmurs, rubs or gallops   Vascular:  Vessel  Right  Left   Radial  Palpable  Palpable   Ulnar  Palpable  Palpable   Brachial  Palpable  Palpable   Carotid  Palpable, without bruit  Palpable, without bruit   Aorta  Not palpable  N/A   Femoral  Palpable  Palpable   Popliteal  Not palpable  Not palpable   PT  Not Palpable  Not Palpable   DP  Not Palpable  Not Palpable    Gastrointestinal: soft, NTND, -G/R, - HSM, - masses, - CVAT B   Musculoskeletal: M/S 5/5 throughout , Extremities without ischemic changes , legs warm to knee, cool feet   Neurologic: CN 2-12 intact , Pain and light touch intact in extremities , Motor exam as listed above   CTA Neck (Date: 11/26/11)  Irregularity and narrowing involving portions of the right common carotid artery with possible areas of ulcerations.   Appearance raises possibility of prior right carotid endarterectomy. Tandem stenosis of the proximal right internal carotid artery. Proximal 72% diameter stenosis. 1.2 cm beyond this region 84% focal stenosis.   Atherosclerotic type changes of the distal  vertical cervical segment of the right internal carotid artery with 58% diameter stenosis.   Atherosclerotic type changes left common carotid artery.   Evaluation of the proximal aspect of the left common carotid artery somewhat limited by streak artifact from injected contrast.   Left carotid bifurcation prominent plaque with 82% diameter stenosis proximal left internal carotid artery with plaque extending over 1.5 cm.   Just proximal to the skull base, plaque causes 40% diameter narrowing of the left internal carotid artery.   Prominent atherosclerotic type changes origin great vessels and right subclavian artery.   Mild narrowing proximal right vertebral artery   Markedly diminutive left vertebral artery. Although this is a congenitally small, superimposed prominent atherosclerotic type changes contribute to marked tandem stenoses of the left vertebral artery.   Marked atherosclerotic type changes of the thoracic aorta/aortic arch with what appears to be a 1.6 x 0.7 cm thrombosed penetrating ulcer left lateral aspect of the distal aortic arch unchanged from the prior exam.   Ascending thoracic aorta measures up to 3.6 cm.  Based on my review of this patient's neck CTA, she has bilateral ICA stenosis >80% (R>L).  Both lesions are below the angle of mandible though the left extends to the angle.  Medical Decision Making  Kjersten ZNIYAH HINCE is a 66 y.o. female who presents with: asx B ICA stenoses > 80%   I discussed  with the patient based on ACAS, she needs a R CEA then L CEA.  I would obtain preoperative cardiac risk stratification and optimization as I suspect some degree of CAD given the amount of disease the carotids and the aortic arch.  She will follow up with after this completed, so we can decide on CEA vs CAS..  I discussed in depth with the patient the nature of atherosclerosis, and emphasized the importance of maximal medical management including strict control of blood  pressure, blood glucose, and lipid levels, antiplatelet agents, obtaining regular exercise, and cessation of smoking.  The patient is aware that without maximal medical management the underlying atherosclerotic disease process will progress, limiting the benefit of any interventions.  Thank you for allowing [place] to participate in this patient's care.  Adele Barthel, MD [facility] [place] Office: [phone] Pager: [phone]  11/26/2011, 4:54 PM

## 2011-11-29 NOTE — Addendum Note (Signed)
Addended by: Mena Goes on: 11/29/2011 08:09 AM   Modules accepted: Orders

## 2011-11-30 ENCOUNTER — Encounter: Payer: Self-pay | Admitting: Cardiovascular Disease

## 2011-11-30 ENCOUNTER — Ambulatory Visit (INDEPENDENT_AMBULATORY_CARE_PROVIDER_SITE_OTHER): Payer: Medicare HMO | Admitting: Cardiovascular Disease

## 2011-11-30 VITALS — BP 170/68 | HR 60 | Ht 65.0 in | Wt 139.0 lb

## 2011-11-30 DIAGNOSIS — Z01810 Encounter for preprocedural cardiovascular examination: Secondary | ICD-10-CM

## 2011-11-30 NOTE — Progress Notes (Signed)
History of Present Illness: 66 yo female with history of tobacco abuse, bilateral carotid artery stenosis, HTN, HLD, DM who is referred today for pre-operative evaluation prior to planned carotid endarterectomy per Dr. [name] with [facility]. She has no prior history of CAD. She has recently been found to have severe bilateral carotid artery disease. Her CTA neck last week with evidence of aortic arch plaque. She tells me today that she feels well. She denies any chest pain, SOB, palpitations. She reports having had a stress test earlier this year but she is not sure where this was done, what city or what cardiologist. She reports that it was a normal stress test.   Primary Care Physician: Antony Blackbird   Past Medical History  Diagnosis Date  . Hypertension   . Diabetes mellitus   . High cholesterol   . Carotid artery disease   . Tobacco abuse   . Breast cancer     s/p lumpectomy, XRT  . Renal artery stenosis     Right renal artery stent 05/31/06    Past Surgical History  Procedure Date  . Adrenal gland surgery   . Breast lumpectomy     Current Outpatient Prescriptions  Medication Sig Dispense Refill  . Alcohol Swabs (B-D SINGLE USE SWABS REGULAR) PADS       . amLODipine (NORVASC) 10 MG tablet Take 10 mg by mouth at bedtime.        Marland Kitchen aspirin EC 81 MG tablet Take 81 mg by mouth daily.        Marland Kitchen atenolol (TENORMIN) 100 MG tablet Take 100 mg by mouth every morning.        . cloNIDine (CATAPRES) 0.1 MG tablet Take 0.1 mg by mouth 2 (two) times daily.        Marland Kitchen gemfibrozil (LOPID) 600 MG tablet Take 600 mg by mouth 2 (two) times daily.        . hydrochlorothiazide (HYDRODIURIL) 25 MG tablet Take 25 mg by mouth daily.      Marland Kitchen HYDROcodone-acetaminophen (VICODIN) 5-500 MG per tablet Take 1 tablet by mouth every 8 (eight) hours as needed. For pain       . Lancet Devices (PRODIGY LANCING DEVICE) MISC       . losartan (COZAAR) 25 MG tablet Take 25 mg by mouth daily.        . metFORMIN (GLUCOPHAGE) 500  MG tablet Take 500 mg by mouth 2 (two) times daily with a meal.        . PRODIGY NO CODING BLOOD GLUC test strip       . PRODIGY TWIST TOP LANCETS 28G MISC       . DISCONTD: cloNIDine (CATAPRES) 0.2 MG tablet       . DISCONTD: losartan (COZAAR) 100 MG tablet       . DISCONTD: metFORMIN (GLUCOPHAGE-XR) 500 MG 24 hr tablet         No Known Allergies  History   Social History  . Marital Status: Divorced    Spouse Name: N/A    Number of Children: 1  . Years of Education: N/A   Occupational History  . Retired-truck driver    Social History Main Topics  . Smoking status: Current Every Day Smoker -- 0.5 packs/day for 50 years    Types: Cigarettes  . Smokeless tobacco: Never Used  . Alcohol Use: No  . Drug Use: No  . Sexually Active: Not on file   Other Topics Concern  . Not on  file   Social History Narrative  . No narrative on file    Family History  Problem Relation Age of Onset  . Cancer Mother     ? type  . Cancer Father     ? type  . Diabetes Sister   . Diabetes Brother   . CAD Neg Hx     Review of Systems:  As stated in the HPI and otherwise negative.   BP 170/68  Pulse 60  Ht 5\' 5"  (1.651 m)  Wt 139 lb (63.05 kg)  BMI 23.13 kg/m2  Physical Examination: General: Well developed, well nourished, NAD HEENT: OP clear, mucus membranes moist SKIN: warm, dry. No rashes. Neuro: No focal deficits Musculoskeletal: Muscle strength 5/5 all ext Psychiatric: Mood and affect normal Neck: No JVD, no carotid bruits, no thyromegaly, no lymphadenopathy. Lungs:Clear bilaterally, no wheezes, rhonci, crackles Cardiovascular: Regular rate and rhythm. No murmurs, gallops or rubs. Abdomen:Soft. Bowel sounds present. Non-tender.  Extremities: No lower extremity edema. Pulses are trace to1 + in the bilateral DP/PT.  CTA neck 11/26/11:  Irregularity and narrowing involving portions of the right common carotid artery with possible areas of ulcerations. Appearance raises  possibility of prior right carotid endarterectomy.  Tandem stenosis of the proximal right internal carotid artery. Proximal 72% diameter stenosis. 1.2 cm beyond this region 84% focal stenosis.  Atherosclerotic type changes of the distal vertical cervical segment of the right internal carotid artery with 58% diameter stenosis.  Atherosclerotic type changes left common carotid artery. Evaluation of the proximal aspect of the left common carotid artery somewhat limited by streak artifact from injected contrast.  Left carotid bifurcation prominent plaque with 82% diameter stenosis proximal left internal carotid artery with plaque extending over 1.5 cm.  Just proximal to the skull base, plaque causes 40% diameter narrowing of the left internal carotid artery.  Prominent atherosclerotic type changes origin great vessels and right subclavian artery.  Mild narrowing proximal right vertebral artery  Markedly diminutive left vertebral artery. Although this is a congenitally small, superimposed prominent atherosclerotic type changes contribute to marked tandem stenoses of the left vertebral artery.  Marked atherosclerotic type changes of the thoracic aorta/aortic arch with what appears to be a 1.6 x 0.7 cm thrombosed penetrating ulcer left lateral aspect of the distal aortic arch unchanged from the prior exam. Ascending thoracic aorta measures up to 3.6 cm.  EKG: NSR, incomplete LBBB, LVH  Assessment and Plan:   1. Pre-operative cardiac examination:  She has multiple risk factors for CAD including HTN, HLD, DM, known severe PAD including bilateral carotid artery stenosis and aortic arch disease. She reports a recent normal stress test and refuses to have another stress test. She is asymptomatic but certainly has a high probability of CAD. Will arrange echo to assess LV size and function. Will attempt to get records from her stress test from primary care. I will see her back in 2 weeks to  review and make further plans.   2. Tobacco abuse: Smoking cessation advised.

## 2011-11-30 NOTE — Patient Instructions (Addendum)
Your physician recommends that you schedule a follow-up appointment in:  2 weeks.   Your physician has requested that you have an echocardiogram. Echocardiography is a painless test that uses sound waves to create images of your heart. It provides your doctor with information about the size and shape of your heart and how well your heart's chambers and valves are working. This procedure takes approximately one hour. There are no restrictions for this procedure.   We are requesting records of your stress test from Dr. Chapman Fitch

## 2011-12-07 ENCOUNTER — Ambulatory Visit (HOSPITAL_COMMUNITY): Payer: Medicare HMO | Attending: Cardiology | Admitting: Radiology

## 2011-12-07 DIAGNOSIS — I369 Nonrheumatic tricuspid valve disorder, unspecified: Secondary | ICD-10-CM | POA: Insufficient documentation

## 2011-12-07 DIAGNOSIS — I10 Essential (primary) hypertension: Secondary | ICD-10-CM | POA: Insufficient documentation

## 2011-12-07 DIAGNOSIS — F17200 Nicotine dependence, unspecified, uncomplicated: Secondary | ICD-10-CM | POA: Insufficient documentation

## 2011-12-07 DIAGNOSIS — E119 Type 2 diabetes mellitus without complications: Secondary | ICD-10-CM | POA: Insufficient documentation

## 2011-12-07 DIAGNOSIS — Z01810 Encounter for preprocedural cardiovascular examination: Secondary | ICD-10-CM | POA: Insufficient documentation

## 2011-12-07 NOTE — Progress Notes (Signed)
Echocardiogram performed.  

## 2011-12-14 ENCOUNTER — Ambulatory Visit (INDEPENDENT_AMBULATORY_CARE_PROVIDER_SITE_OTHER): Payer: Medicare HMO | Admitting: Cardiovascular Disease

## 2011-12-14 ENCOUNTER — Encounter: Payer: Self-pay | Admitting: Cardiovascular Disease

## 2011-12-14 VITALS — BP 156/76 | HR 75 | Ht 65.0 in | Wt 142.0 lb

## 2011-12-14 DIAGNOSIS — Z01810 Encounter for preprocedural cardiovascular examination: Secondary | ICD-10-CM

## 2011-12-14 DIAGNOSIS — F17200 Nicotine dependence, unspecified, uncomplicated: Secondary | ICD-10-CM

## 2011-12-14 DIAGNOSIS — Z720 Tobacco use: Secondary | ICD-10-CM

## 2011-12-14 NOTE — Progress Notes (Signed)
History of Present Illness: 66 yo AAF with history of tobacco abuse, bilateral carotid artery stenosis, renal artery stenosis, hepatitis C, HTN, HLD, DM who is here today for cardiac follow up. I saw her 11/30/11 for pre-operative evaluation prior to planned carotid endarterectomy with Dr. [name] with [facility]. She has no prior history of CAD. She has recently been found to have severe bilateral carotid artery disease. Her CTA neck showed evidence of aortic arch plaque. She told me at the first visit that she feels well. She denied any chest pain, SOB, palpitations. She had a stress test December 2012 in [facility] that was reported as normal. I arranged an echo 12/07/11 which showed normal LV and RV size and function with no significant valvular issues.   She is here today for follow up. No new complaints. No chest pain or SOB. Energy level is good. She does have pain in both legs and she has had recent reduced ABI on testing in [facility].   Primary Care Physician: Antony Blackbird Vascular Surgery: [name]  Past Medical History  Diagnosis Date  . Hypertension   . Diabetes mellitus   . High cholesterol   . Carotid artery disease   . Tobacco abuse   . Breast cancer     s/p lumpectomy, XRT  . Renal artery stenosis     Right renal artery stent 05/31/06    Past Surgical History  Procedure Date  . Adrenal gland surgery   . Breast lumpectomy     Current Outpatient Prescriptions  Medication Sig Dispense Refill  . Alcohol Swabs (B-D SINGLE USE SWABS REGULAR) PADS       . amLODipine (NORVASC) 10 MG tablet Take 10 mg by mouth at bedtime.        Marland Kitchen aspirin EC 81 MG tablet Take 81 mg by mouth daily.        Marland Kitchen atenolol (TENORMIN) 100 MG tablet Take 100 mg by mouth every morning.        . cloNIDine (CATAPRES) 0.1 MG tablet Take 0.1 mg by mouth 2 (two) times daily.        Marland Kitchen gemfibrozil (LOPID) 600 MG tablet Take 600 mg by mouth 2 (two) times daily.        . hydrochlorothiazide (HYDRODIURIL) 25 MG tablet Take  25 mg by mouth daily.      Marland Kitchen HYDROcodone-acetaminophen (VICODIN) 5-500 MG per tablet Take 1 tablet by mouth every 8 (eight) hours as needed. For pain       . Lancet Devices (PRODIGY LANCING DEVICE) MISC       . losartan (COZAAR) 25 MG tablet Take 25 mg by mouth daily.        . metFORMIN (GLUCOPHAGE) 500 MG tablet Take 500 mg by mouth 2 (two) times daily with a meal.        . PRODIGY NO CODING BLOOD GLUC test strip       . PRODIGY TWIST TOP LANCETS 28G MISC         No Known Allergies  History   Social History  . Marital Status: Divorced    Spouse Name: N/A    Number of Children: 1  . Years of Education: N/A   Occupational History  . Retired-truck driver    Social History Main Topics  . Smoking status: Current Every Day Smoker -- 0.5 packs/day for 50 years    Types: Cigarettes  . Smokeless tobacco: Never Used  . Alcohol Use: No  . Drug Use: No  .  Sexually Active: Not on file   Other Topics Concern  . Not on file   Social History Narrative  . No narrative on file    Family History  Problem Relation Age of Onset  . Cancer Mother     ? type  . Cancer Father     ? type  . Diabetes Sister   . Diabetes Brother   . CAD Neg Hx     Review of Systems:  As stated in the HPI and otherwise negative.   BP 156/76  Pulse 75  Ht 5\' 5"  (1.651 m)  Wt 142 lb (64.411 kg)  BMI 23.63 kg/m2  Physical Examination: General: Well developed, well nourished, NAD HEENT: OP clear, mucus membranes moist SKIN: warm, dry. No rashes. Neuro: No focal deficits Musculoskeletal: Muscle strength 5/5 all ext Psychiatric: Mood and affect normal Neck: No JVD, no carotid bruits, no thyromegaly, no lymphadenopathy. Lungs:Clear bilaterally, no wheezes, rhonci, crackles Cardiovascular: Regular rate and rhythm. No murmurs, gallops or rubs. Abdomen:Soft. Bowel sounds present. Non-tender.  Extremities: No lower extremity edema. Pulses are 2 + in the bilateral DP/PT.  Echo 12/07/11:  Left ventricle:  Parameters are borderline for elevated filling pressure. The cavity size was normal. Wall thickness was increased in a pattern of mild LVH. The estimated ejection fraction was 60%. Wall motion was normal; there were no regional wall motion abnormalities. Doppler parameters are consistent with abnormal left ventricular relaxation (grade 1 diastolic dysfunction). - Right ventricle: The cavity size was normal. Systolic function was normal.  Assessment and Plan:   1. Pre-operative cardiac examination: She has multiple risk factors for CAD including HTN, HLD, DM, known severe PAD including bilateral carotid artery stenosis, bilateral lower extremity arterial disease and aortic arch disease. She had a normal stress test December 2012 in [facility]. She refuses to have another stress test in the future. Echo with normal LV function and no wall motion abnormalities. No further cardiac workup. Proceed with planned surgical procedure.    2. Tobacco abuse: Smoking cessation advised.   3. PAD: Severe disease in both legs and bilateral carotid arteries. Following in [facility].

## 2011-12-14 NOTE — Patient Instructions (Addendum)
Your physician recommends that you schedule a follow-up appointment as needed with Dr. McAlhany   

## 2011-12-16 ENCOUNTER — Encounter: Payer: Self-pay | Admitting: Vascular Surgery

## 2011-12-17 ENCOUNTER — Encounter: Payer: Self-pay | Admitting: Vascular Surgery

## 2011-12-17 ENCOUNTER — Ambulatory Visit (INDEPENDENT_AMBULATORY_CARE_PROVIDER_SITE_OTHER): Payer: Medicare HMO | Admitting: Vascular Surgery

## 2011-12-17 VITALS — BP 190/62 | HR 64 | Ht 65.0 in | Wt 142.0 lb

## 2011-12-17 DIAGNOSIS — I6529 Occlusion and stenosis of unspecified carotid artery: Secondary | ICD-10-CM

## 2011-12-17 HISTORY — DX: Occlusion and stenosis of unspecified carotid artery: I65.29

## 2011-12-17 NOTE — Progress Notes (Signed)
[facility] HISTORY AND PHYSICAL 12/17/2011 DOB: [birth year] MRN : [id number]  [id number] Severe carotid Stenosis CARDIOLOGIST: Leonard Downing  History of Present Illness: Kristen Bradley is a 66 y.o. female who presents with chief complaint: B carotid stenosis. Previous carotid studies demonstrated: RICA 0000000 stenosis, LICA 0000000 stenosis. On 11/05/11 the Carotid duplex revealed a significant increase in both Carotid arteries with the right at 80-99%. CTA confirmed > 80% carotid stenosis bilaterally. Patient has no history of TIA or stroke symptom. The patient has never had amaurosis fugax or monocular blindness. The patient has never had facial drooping or hemiplegia. The patient has never had receptive or expressive aphasia. The patient's risks factors for carotid disease include: HTN, DM, Hypercholesterolism, and active smoking.  Pt is Right hand dominenet  She was seen by Dr Glennie Hawk on 12/14/11 for Cardiac clearance: 1. Pre-operative cardiac examination: She has multiple risk factors for CAD including HTN, HLD, DM, known severe PAD including bilateral carotid artery stenosis, bilateral lower extremity arterial disease and aortic arch disease. She had a normal stress test December 2012 in [facility]. She refuses to have another stress test in the future. Echo with normal LV function and no wall motion abnormalities. No further cardiac workup. Proceed with planned surgical procedure.     Past Medical History  Diagnosis Date  . Hypertension   . Diabetes mellitus   . High cholesterol   . Carotid artery disease   . Tobacco abuse   . Breast cancer     s/p lumpectomy, XRT  . Renal artery stenosis     Right renal artery stent 05/31/06  . Hepatitis C     Past Surgical History  Procedure Date  . Adrenal gland surgery   . Breast lumpectomy      ROS: [x]  Positive  [ ]  Denies    General: [ ]  Weight loss, [ ]  Fever, [ ]  chills Neurologic: [ ]   Dizziness, [ ]  Blackouts, [ ]  Seizure [ ]  Stroke, [ ]  "Mini stroke", [ ]  Slurred speech, [ ]  Temporary blindness; [ ]  weakness in arms or legs, [ ]  Hoarseness Cardiac: [ ]  Chest pain/pressure, [ ]  Shortness of breath at rest [x ] Shortness of breath with exertion, [ ]  Atrial fibrillation or irregular heartbeat Vascular: [x ] Pain in legs with walking, [ ]  Pain in legs at rest, [ ]  Pain in legs at night,  [ ]  Non-healing ulcer, [ ]  Blood clot in vein/DVT,   Pulmonary: [ ]  Home oxygen, [ ]  Productive cough, [ ]  Coughing up blood, [ ]  Asthma,  [ ]  Wheezing Musculoskeletal:  [ ]  Arthritis, [ ]  Low back pain, [ ]  Joint pain Hematologic: [ ]  Easy Bruising, [ ]  Anemia; [ ]  Hepatitis Gastrointestinal: [ ]  Blood in stool, [ ]  Gastroesophageal Reflux/heartburn, [ ]  Trouble swallowing Urinary: [ x] chronic Kidney disease, [ ]  on HD - [ ]  MWF or [ ]  TTHS, [ ]  Burning with urination, [ ]  Difficulty urinating Skin: [ ]  Rashes, [ ]  Wounds Psychological: [ ]  Anxiety, [ ]  Depression  Social History History  Substance Use Topics  . Smoking status: Current Every Day Smoker -- 0.5 packs/day for 50 years    Types: Cigarettes  . Smokeless tobacco: Never Used     Comment: pt states " I will quit on my own"  . Alcohol Use: No    Family History Family History  Problem Relation Age of Onset  . Cancer Mother     ?  type  . Cancer Father     ? type  . Diabetes Sister   . Diabetes Brother   . CAD Neg Hx     No Known Allergies  Current Outpatient Prescriptions  Medication Sig Dispense Refill  . Alcohol Swabs (B-D SINGLE USE SWABS REGULAR) PADS       . amLODipine (NORVASC) 10 MG tablet Take 10 mg by mouth at bedtime.        Marland Kitchen aspirin EC 81 MG tablet Take 81 mg by mouth daily.        Marland Kitchen atenolol (TENORMIN) 100 MG tablet Take 100 mg by mouth every morning.        . cloNIDine (CATAPRES) 0.1 MG tablet Take 0.1 mg by mouth 2 (two) times daily.        Marland Kitchen gemfibrozil (LOPID) 600 MG tablet Take 600 mg by mouth 2  (two) times daily.        . hydrochlorothiazide (HYDRODIURIL) 25 MG tablet Take 25 mg by mouth daily.      Elmore Guise Devices (PRODIGY LANCING DEVICE) MISC       . losartan (COZAAR) 25 MG tablet Take 25 mg by mouth daily.        . metFORMIN (GLUCOPHAGE) 500 MG tablet Take 500 mg by mouth 2 (two) times daily with a meal.        . PRODIGY NO CODING BLOOD GLUC test strip       . PRODIGY TWIST TOP LANCETS 28G MISC       . traMADol (ULTRAM) 50 MG tablet As directed         Imaging: No results found.  Significant Diagnostic Studies: CBC Lab Results  Component Value Date   WBC 5.6 12/30/2010   HGB 13.3 12/30/2010   HCT 39.3 12/30/2010   MCV 87.9 12/30/2010   PLT 126* 12/30/2010    BMET    Component Value Date/Time   NA 142 12/30/2010 1427   K 4.3 12/30/2010 1427   CL 107 12/30/2010 1427   CO2 25 12/30/2010 1427   GLUCOSE 109* 12/30/2010 1427   BUN 17 11/05/2011 1208   CREATININE 0.82 11/05/2011 1208   CREATININE 0.73 12/30/2010 1427   CALCIUM 10.7* 12/30/2010 1427   GFRNONAA 89* 12/30/2010 1427   GFRAA >90 12/30/2010 1427    COAG Lab Results  Component Value Date   INR 0.90 01/16/2010   INR 0.9 08/25/2007   INR 0.8 02/03/2007   No results found for this basename: PTT     Physical Examination BP Readings from Last 3 Encounters:  12/17/11 190/62  12/14/11 156/76  11/30/11 170/68   Temp Readings from Last 3 Encounters:  12/31/10 97.6 F (36.4 C) Oral   SpO2 Readings from Last 3 Encounters:  12/17/11 100%  11/05/11 100%  12/31/10 100%   Pulse Readings from Last 3 Encounters:  12/17/11 64  12/14/11 75  11/30/11 60    General:  WDWN in NAD Gait: Normal HENT: WNL Eyes: Pupils equal Pulmonary: normal non-labored breathing , without Rales, rhonchi,  wheezing Cardiac: RRR, without  Murmurs, rubs or gallops; Bilatreral carotid bruits Abdomen: soft, NT, no masses Skin: no rashes, ulcers noted Extremities without ischemic changes, no Gangrene , no cellulitis; no  open wounds;  Musculoskeletal: no muscle wasting or atrophy  Neurologic: A&O X 3; Appropriate Affect ;  SENSATION: normal; MOTOR FUNCTION: Pt has good and equal strength in all extremities - 5/5 Speech is fluent/normal  Non-Invasive Vascular Imaging: CTA Neck (Date: 11/26/11)  Irregularity and narrowing involving portions of the right common carotid artery with possible areas of ulcerations.  Appearance raises possibility of prior right carotid endarterectomy. Tandem stenosis of the proximal right internal carotid artery. Proximal 72% diameter stenosis. 1.2 cm beyond this region 84% focal stenosis.  Atherosclerotic type changes of the distal vertical cervical segment of the right internal carotid artery with 58% diameter stenosis.  Atherosclerotic type changes left common carotid artery.  Evaluation of the proximal aspect of the left common carotid artery somewhat limited by streak artifact from injected contrast.  Left carotid bifurcation prominent plaque with 82% diameter stenosis proximal left internal carotid artery with plaque extending over 1.5 cm.  Just proximal to the skull base, plaque causes 40% diameter narrowing of the left internal carotid artery.  Prominent atherosclerotic type changes origin great vessels and right subclavian artery.  Mild narrowing proximal right vertebral artery  Markedly diminutive left vertebral artery. Although this is a congenitally small, superimposed prominent atherosclerotic type changes contribute to marked tandem stenoses of the left vertebral artery.  Marked atherosclerotic type changes of the thoracic aorta/aortic arch with what appears to be a 1.6 x 0.7 cm thrombosed penetrating ulcer left lateral aspect of the distal aortic arch unchanged from the prior exam.  Ascending thoracic aorta measures up to 3.6 cm. Based on my review of this patient's neck CTA, she has bilateral ICA stenosis >80% (R>L). Both lesions are below the angle of mandible though  the left extends to the angle.   Medical Decision Making ASSESSMENT/PLAN:   Bilateral Severe Asymptomatic Carotid artery Stenosis, R>L  There has been a significant increase in velocities from a year ago per Duplex scan which was confirmed by CTA at >80 % Carotid stenosis bilaterally  Plan is to proceed with Right Carotid endarterectomy  Addendum  I have independently interviewed and examined the patient, and I agree with the physician assistant's findings.  I discussed with the patient the risks, benefits, and alternatives to carotid endarterectomy.  I discussed the procedural details of carotid endarterectomy with the patient.  The patient is aware that the risks of carotid endarterectomy include but are not limited to: bleeding, infection, stroke, myocardial infarction, death, cranial nerve injuries both temporary and permanent, neck hematoma, possible airway compromise, labile blood pressure post-operatively, cerebral hyperperfusion syndrome, and possible need for additional interventions in the future. The patient is aware of the risks and agrees to proceed forward with the procedure on 4 DEC 13.   Adele Barthel, MD [facility] Office: [phone] Pager: [phone]  12/17/2011, 10:35 AM

## 2011-12-20 ENCOUNTER — Other Ambulatory Visit: Payer: Self-pay | Admitting: *Deleted

## 2011-12-23 ENCOUNTER — Encounter (HOSPITAL_COMMUNITY): Payer: Self-pay | Admitting: Respiratory Therapy

## 2011-12-27 ENCOUNTER — Encounter (HOSPITAL_COMMUNITY): Payer: Self-pay

## 2011-12-27 ENCOUNTER — Encounter (HOSPITAL_COMMUNITY)
Admission: RE | Admit: 2011-12-27 | Discharge: 2011-12-27 | Disposition: A | Discharge status: Home / Self Care | Payer: Medicare HMO | Source: Ambulatory Visit | Attending: Vascular Surgery | Admitting: Vascular Surgery

## 2011-12-27 ENCOUNTER — Encounter (HOSPITAL_COMMUNITY)
Admission: RE | Admit: 2011-12-27 | Discharge: 2011-12-27 | Disposition: A | Discharge status: Home / Self Care | Payer: Medicare HMO | Source: Ambulatory Visit | Attending: Anesthesiology | Admitting: Anesthesiology

## 2011-12-27 HISTORY — DX: Pain in unspecified joint: M25.50

## 2011-12-27 HISTORY — DX: Personal history of urinary (tract) infections: Z87.440

## 2011-12-27 LAB — COMPREHENSIVE METABOLIC PANEL
Alkaline Phosphatase: 79 U/L (ref 39–117)
BUN: 16 mg/dL (ref 6–23)
Chloride: 104 mEq/L (ref 96–112)
GFR calc Af Amer: >90 mL/min (ref >90)
GFR calc non Af Amer: 88 mL/min — ABNORMAL LOW (ref >90)
Glucose, Bld: 109 mg/dL — ABNORMAL HIGH (ref 70–99)
Potassium: 4.4 mEq/L (ref 3.5–5.1)
Total Bilirubin: 0.5 mg/dL (ref 0.3–1.2)

## 2011-12-27 LAB — URINALYSIS, ROUTINE W REFLEX MICROSCOPIC
Ketones, ur: NEGATIVE mg/dL
Leukocytes, UA: NEGATIVE
Nitrite: NEGATIVE
Protein, ur: 100 mg/dL — AB
Urobilinogen, UA: 1 mg/dL (ref 0.0–1.0)

## 2011-12-27 LAB — CBC
HCT: 39 % (ref 36.0–46.0)
Hemoglobin: 12.9 g/dL (ref 12.0–15.0)
MCV: 87.1 fL (ref 78.0–100.0)
WBC: 6.7 10*3/uL (ref 4.0–10.5)

## 2011-12-27 LAB — URINE MICROSCOPIC-ADD ON

## 2011-12-27 LAB — APTT: aPTT: 29 seconds (ref 24–37)

## 2011-12-27 NOTE — Progress Notes (Signed)
Pt refused to allow [place] to put the blood bank bracelet on.Said she would not wear it during the holidays

## 2011-12-27 NOTE — Pre-Procedure Instructions (Signed)
[place]  12/27/2011   Your procedure is scheduled on:  Wed, Dec 4 @ 8:30 AM  Report to [facility] at 6:30 AM.  Call this number if you have problems the morning of surgery: [phone]   Remember:   Do not eat food:After Midnight.    Take these medicines the morning of surgery with A SIP OF WATER: Amlodipine(Norvasc),Atenolol(Tenormin),Clonidine(Catapres),Diflucan(Fluconazole),and Tramadol(Ultram)   Do not wear jewelry, make-up or nail polish.  Do not wear lotions, powders, or perfumes. You may wear deodorant.  Do not shave 48 hours prior to surgery.   Do not bring valuables to the hospital.  Contacts, dentures or bridgework may not be worn into surgery.  Leave suitcase in the car. After surgery it may be brought to your room.  For patients admitted to the hospital, checkout time is 11:00 AM the day of discharge.   Patients discharged the day of surgery will not be allowed to drive home.    Special Instructions: Shower using CHG 2 nights before surgery and the night before surgery.  If you shower the day of surgery use CHG.  Use special wash - you have one bottle of CHG for all showers.  You should use approximately 1/3 of the bottle for each shower.   Please read over the following fact sheets that you were given: Pain Booklet, Coughing and Deep Breathing, Blood Transfusion Information, MRSA Information and Surgical Site Infection Prevention

## 2011-12-27 NOTE — Progress Notes (Addendum)
Cardiologist is Dr.McAlhany with office visit and clearance note from 12/14/11 in epic  Echo in epic from 12/07/11  Stress test done about a yr ago-to request report from [facility] as well as heart cath  Dr.Cammie Fulps with [facility] is MEdical MD  EKG in epic from 11/2911 Last cxr in mid 2012

## 2012-01-04 MED ORDER — SODIUM CHLORIDE 0.9 % IV SOLN: INTRAVENOUS | Status: DC

## 2012-01-04 MED ORDER — DEXTROSE 5 % IV SOLN
1.5000 g | INTRAVENOUS | Status: AC
  Administered 2012-01-05: 1.5 g via INTRAVENOUS
  Filled 2012-01-04: qty 1.5

## 2012-01-05 ENCOUNTER — Telehealth: Payer: Self-pay | Admitting: Vascular Surgery

## 2012-01-05 ENCOUNTER — Inpatient Hospital Stay (HOSPITAL_COMMUNITY): Payer: Medicare HMO | Admitting: Anesthesiology

## 2012-01-05 ENCOUNTER — Encounter (HOSPITAL_COMMUNITY): Payer: Self-pay | Admitting: Anesthesiology

## 2012-01-05 ENCOUNTER — Encounter (HOSPITAL_COMMUNITY)
Admission: RE | Disposition: A | Discharge status: Home / Self Care | Payer: Self-pay | Source: Ambulatory Visit | Attending: Vascular Surgery

## 2012-01-05 ENCOUNTER — Encounter (HOSPITAL_COMMUNITY): Payer: Self-pay

## 2012-01-05 ENCOUNTER — Inpatient Hospital Stay (HOSPITAL_COMMUNITY)
Admission: RE | Admit: 2012-01-05 | Discharge: 2012-01-07 | DRG: 039 | Disposition: A | Discharge status: Home / Self Care | Payer: Medicare HMO | Source: Ambulatory Visit | Attending: Vascular Surgery | Admitting: Vascular Surgery

## 2012-01-05 DIAGNOSIS — Z79899 Other long term (current) drug therapy: Secondary | ICD-10-CM

## 2012-01-05 DIAGNOSIS — Z9861 Coronary angioplasty status: Secondary | ICD-10-CM

## 2012-01-05 DIAGNOSIS — E7800 Pure hypercholesterolemia, unspecified: Secondary | ICD-10-CM | POA: Diagnosis present

## 2012-01-05 DIAGNOSIS — E119 Type 2 diabetes mellitus without complications: Secondary | ICD-10-CM | POA: Diagnosis present

## 2012-01-05 DIAGNOSIS — I10 Essential (primary) hypertension: Secondary | ICD-10-CM | POA: Diagnosis present

## 2012-01-05 DIAGNOSIS — Z853 Personal history of malignant neoplasm of breast: Secondary | ICD-10-CM

## 2012-01-05 DIAGNOSIS — Z87440 Personal history of urinary (tract) infections: Secondary | ICD-10-CM

## 2012-01-05 DIAGNOSIS — Z7982 Long term (current) use of aspirin: Secondary | ICD-10-CM

## 2012-01-05 DIAGNOSIS — I2510 Atherosclerotic heart disease of native coronary artery without angina pectoris: Secondary | ICD-10-CM | POA: Diagnosis present

## 2012-01-05 DIAGNOSIS — I6529 Occlusion and stenosis of unspecified carotid artery: Secondary | ICD-10-CM

## 2012-01-05 DIAGNOSIS — I739 Peripheral vascular disease, unspecified: Secondary | ICD-10-CM | POA: Diagnosis present

## 2012-01-05 DIAGNOSIS — I658 Occlusion and stenosis of other precerebral arteries: Principal | ICD-10-CM | POA: Diagnosis present

## 2012-01-05 DIAGNOSIS — F17200 Nicotine dependence, unspecified, uncomplicated: Secondary | ICD-10-CM | POA: Diagnosis present

## 2012-01-05 HISTORY — PX: ENDARTERECTOMY: SHX5162

## 2012-01-05 LAB — GLUCOSE, CAPILLARY
Glucose-Capillary: 154 mg/dL — ABNORMAL HIGH (ref 70–99)
Glucose-Capillary: 156 mg/dL — ABNORMAL HIGH (ref 70–99)
Glucose-Capillary: 175 mg/dL — ABNORMAL HIGH (ref 70–99)

## 2012-01-05 LAB — TYPE AND SCREEN: Antibody Screen: NEGATIVE

## 2012-01-05 SURGERY — ENDARTERECTOMY, CAROTID
Anesthesia: General | Site: Neck | Laterality: Right | Wound class: Clean

## 2012-01-05 MED ORDER — HYDRALAZINE HCL 20 MG/ML IJ SOLN
10.0000 mg | INTRAMUSCULAR | Status: DC | PRN
  Administered 2012-01-05: 19:00:00 via INTRAVENOUS
  Administered 2012-01-05 – 2012-01-07 (×2): 10 mg via INTRAVENOUS
  Filled 2012-01-05: qty 1
  Filled 2012-01-05: qty 0.5
  Filled 2012-01-05: qty 1

## 2012-01-05 MED ORDER — GUAIFENESIN-DM 100-10 MG/5ML PO SYRP: 15.0000 mL | ORAL_SOLUTION | ORAL | Status: DC | PRN

## 2012-01-05 MED ORDER — LIDOCAINE HCL (PF) 1 % IJ SOLN
INTRAMUSCULAR | Status: AC
  Filled 2012-01-05: qty 30

## 2012-01-05 MED ORDER — OXYCODONE HCL 5 MG/5ML PO SOLN: 5.0000 mg | Freq: Once | ORAL | Status: DC | PRN

## 2012-01-05 MED ORDER — MORPHINE SULFATE 2 MG/ML IJ SOLN
2.0000 mg | INTRAMUSCULAR | Status: DC | PRN
  Administered 2012-01-05: 2 mg via INTRAVENOUS
  Administered 2012-01-05: 4 mg via INTRAVENOUS
  Administered 2012-01-05: 2 mg via INTRAVENOUS
  Administered 2012-01-05: 4 mg via INTRAVENOUS
  Filled 2012-01-05: qty 2
  Filled 2012-01-05: qty 1
  Filled 2012-01-05: qty 2

## 2012-01-05 MED ORDER — ASPIRIN EC 81 MG PO TBEC
81.0000 mg | DELAYED_RELEASE_TABLET | Freq: Every day | ORAL | Status: DC
  Filled 2012-01-05 (×2): qty 1

## 2012-01-05 MED ORDER — FENTANYL CITRATE 0.05 MG/ML IJ SOLN
25.0000 ug | INTRAMUSCULAR | Status: DC | PRN
  Administered 2012-01-05 (×3): 50 ug via INTRAVENOUS

## 2012-01-05 MED ORDER — MIDAZOLAM HCL 2 MG/2ML IJ SOLN: 0.5000 mg | Freq: Once | INTRAMUSCULAR | Status: DC | PRN

## 2012-01-05 MED ORDER — CLONIDINE HCL 0.1 MG PO TABS
0.1000 mg | ORAL_TABLET | Freq: Two times a day (BID) | ORAL | Status: DC
  Administered 2012-01-05 – 2012-01-07 (×4): 0.1 mg via ORAL
  Filled 2012-01-05 (×5): qty 1

## 2012-01-05 MED ORDER — AMLODIPINE BESYLATE 10 MG PO TABS
10.0000 mg | ORAL_TABLET | Freq: Every day | ORAL | Status: DC
  Administered 2012-01-05 – 2012-01-06 (×2): 10 mg via ORAL
  Filled 2012-01-05 (×3): qty 1

## 2012-01-05 MED ORDER — SODIUM CHLORIDE 0.9 % IV SOLN
INTRAVENOUS | Status: DC
  Administered 2012-01-05: 14:00:00 via INTRAVENOUS

## 2012-01-05 MED ORDER — INSULIN ASPART 100 UNIT/ML ~~LOC~~ SOLN
0.0000 [IU] | Freq: Three times a day (TID) | SUBCUTANEOUS | Status: DC
  Administered 2012-01-05: 18:00:00 via SUBCUTANEOUS
  Administered 2012-01-06: 2 [IU] via SUBCUTANEOUS
  Administered 2012-01-07: 3 [IU] via SUBCUTANEOUS

## 2012-01-05 MED ORDER — TRAMADOL HCL 50 MG PO TABS: 50.0000 mg | ORAL_TABLET | Freq: Four times a day (QID) | ORAL | Status: DC | PRN

## 2012-01-05 MED ORDER — CLOPIDOGREL BISULFATE 75 MG PO TABS: 75.0000 mg | ORAL_TABLET | Freq: Every day | ORAL | Status: DC

## 2012-01-05 MED ORDER — HEPARIN SODIUM (PORCINE) 1000 UNIT/ML IJ SOLN
INTRAMUSCULAR | Status: DC | PRN
  Administered 2012-01-05: 6000 [IU] via INTRAVENOUS

## 2012-01-05 MED ORDER — FLUCONAZOLE 100 MG PO TABS
100.0000 mg | ORAL_TABLET | Freq: Every day | ORAL | Status: DC
  Administered 2012-01-06 – 2012-01-07 (×2): 100 mg via ORAL
  Filled 2012-01-05 (×2): qty 1

## 2012-01-05 MED ORDER — OXYCODONE HCL 5 MG PO TABS: 5.0000 mg | ORAL_TABLET | Freq: Once | ORAL | Status: DC | PRN

## 2012-01-05 MED ORDER — LABETALOL HCL 5 MG/ML IV SOLN
10.0000 mg | INTRAVENOUS | Status: DC | PRN
  Filled 2012-01-05: qty 4

## 2012-01-05 MED ORDER — ACETAMINOPHEN 325 MG PO TABS
325.0000 mg | ORAL_TABLET | ORAL | Status: DC | PRN
  Filled 2012-01-05: qty 2

## 2012-01-05 MED ORDER — ROCURONIUM BROMIDE 100 MG/10ML IV SOLN
INTRAVENOUS | Status: DC | PRN
  Administered 2012-01-05: 50 mg via INTRAVENOUS

## 2012-01-05 MED ORDER — LACTATED RINGERS IV SOLN
INTRAVENOUS | Status: DC | PRN
  Administered 2012-01-05: 08:00:00 via INTRAVENOUS

## 2012-01-05 MED ORDER — LIDOCAINE HCL (CARDIAC) 20 MG/ML IV SOLN
INTRAVENOUS | Status: DC | PRN
  Administered 2012-01-05: 20 mg via INTRAVENOUS

## 2012-01-05 MED ORDER — CLOPIDOGREL BISULFATE 300 MG PO TABS
300.0000 mg | ORAL_TABLET | Freq: Once | ORAL | Status: DC
  Filled 2012-01-05: qty 1

## 2012-01-05 MED ORDER — DEXTRAN 40 IN SALINE 10-0.9 % IV SOLN
25.0000 mL/h | INTRAVENOUS | Status: DC
  Filled 2012-01-05: qty 500

## 2012-01-05 MED ORDER — SODIUM CHLORIDE 0.9 % IR SOLN
Status: DC | PRN
  Administered 2012-01-05: 09:00:00

## 2012-01-05 MED ORDER — MEPERIDINE HCL 25 MG/ML IJ SOLN: 6.2500 mg | INTRAMUSCULAR | Status: DC | PRN

## 2012-01-05 MED ORDER — METFORMIN HCL 500 MG PO TABS
500.0000 mg | ORAL_TABLET | Freq: Two times a day (BID) | ORAL | Status: DC
  Administered 2012-01-06 – 2012-01-07 (×3): 500 mg via ORAL
  Filled 2012-01-05 (×5): qty 1

## 2012-01-05 MED ORDER — SUFENTANIL CITRATE 50 MCG/ML IV SOLN
INTRAVENOUS | Status: DC | PRN
  Administered 2012-01-05: 10 ug via INTRAVENOUS
  Administered 2012-01-05: 5 ug via INTRAVENOUS
  Administered 2012-01-05: 10 ug via INTRAVENOUS

## 2012-01-05 MED ORDER — LOSARTAN POTASSIUM 50 MG PO TABS
100.0000 mg | ORAL_TABLET | Freq: Every day | ORAL | Status: DC
  Administered 2012-01-06 – 2012-01-07 (×2): 100 mg via ORAL
  Filled 2012-01-05 (×2): qty 2

## 2012-01-05 MED ORDER — PHENOL 1.4 % MT LIQD: 1.0000 | OROMUCOSAL | Status: DC | PRN

## 2012-01-05 MED ORDER — SODIUM CHLORIDE 0.9 % IV SOLN: 500.0000 mL | Freq: Once | INTRAVENOUS | Status: AC | PRN

## 2012-01-05 MED ORDER — FENTANYL CITRATE 0.05 MG/ML IJ SOLN
INTRAMUSCULAR | Status: AC
  Filled 2012-01-05: qty 2

## 2012-01-05 MED ORDER — DOCUSATE SODIUM 100 MG PO CAPS
100.0000 mg | ORAL_CAPSULE | Freq: Every day | ORAL | Status: DC
  Administered 2012-01-06 – 2012-01-07 (×2): 100 mg via ORAL
  Filled 2012-01-05 (×2): qty 1

## 2012-01-05 MED ORDER — GEMFIBROZIL 600 MG PO TABS
600.0000 mg | ORAL_TABLET | Freq: Two times a day (BID) | ORAL | Status: DC
  Administered 2012-01-05 – 2012-01-07 (×4): 600 mg via ORAL
  Filled 2012-01-05 (×5): qty 1

## 2012-01-05 MED ORDER — ONDANSETRON HCL 4 MG/2ML IJ SOLN: 4.0000 mg | Freq: Four times a day (QID) | INTRAMUSCULAR | Status: DC | PRN

## 2012-01-05 MED ORDER — OXYCODONE-ACETAMINOPHEN 5-325 MG PO TABS
1.0000 | ORAL_TABLET | ORAL | Status: DC | PRN
  Administered 2012-01-05: 1 via ORAL
  Administered 2012-01-06 (×2): 2 via ORAL
  Filled 2012-01-05 (×2): qty 2
  Filled 2012-01-05: qty 1

## 2012-01-05 MED ORDER — LIDOCAINE HCL (PF) 1 % IJ SOLN
INTRAMUSCULAR | Status: DC | PRN
  Administered 2012-01-05: .5 mL

## 2012-01-05 MED ORDER — PANTOPRAZOLE SODIUM 40 MG PO TBEC
40.0000 mg | DELAYED_RELEASE_TABLET | Freq: Every day | ORAL | Status: DC
  Administered 2012-01-06: 40 mg via ORAL
  Filled 2012-01-05: qty 1

## 2012-01-05 MED ORDER — THROMBIN 20000 UNITS EX KIT
PACK | CUTANEOUS | Status: DC | PRN
  Administered 2012-01-05: 10:00:00 via TOPICAL

## 2012-01-05 MED ORDER — PROPOFOL 10 MG/ML IV BOLUS
INTRAVENOUS | Status: DC | PRN
  Administered 2012-01-05: 150 mg via INTRAVENOUS
  Administered 2012-01-05: 20 mg via INTRAVENOUS

## 2012-01-05 MED ORDER — PROTAMINE SULFATE 10 MG/ML IV SOLN
INTRAVENOUS | Status: DC | PRN
  Administered 2012-01-05: 30 mg via INTRAVENOUS

## 2012-01-05 MED ORDER — HYDROCHLOROTHIAZIDE 25 MG PO TABS
12.5000 mg | ORAL_TABLET | Freq: Every day | ORAL | Status: DC
  Filled 2012-01-05: qty 0.5

## 2012-01-05 MED ORDER — METOPROLOL TARTRATE 1 MG/ML IV SOLN: 2.0000 mg | INTRAVENOUS | Status: DC | PRN

## 2012-01-05 MED ORDER — PROMETHAZINE HCL 25 MG/ML IJ SOLN: 6.2500 mg | INTRAMUSCULAR | Status: DC | PRN

## 2012-01-05 MED ORDER — CLOPIDOGREL BISULFATE 75 MG PO TABS
75.0000 mg | ORAL_TABLET | Freq: Every day | ORAL | Status: DC
  Administered 2012-01-06 – 2012-01-07 (×2): 75 mg via ORAL
  Filled 2012-01-05 (×3): qty 1

## 2012-01-05 MED ORDER — DOPAMINE-DEXTROSE 3.2-5 MG/ML-% IV SOLN
3.0000 ug/kg/min | INTRAVENOUS | Status: DC | PRN
  Filled 2012-01-05: qty 250

## 2012-01-05 MED ORDER — ACETAMINOPHEN 650 MG RE SUPP: 325.0000 mg | RECTAL | Status: DC | PRN

## 2012-01-05 MED ORDER — ONDANSETRON HCL 4 MG/2ML IJ SOLN
INTRAMUSCULAR | Status: DC | PRN
  Administered 2012-01-05: 4 mg via INTRAVENOUS

## 2012-01-05 MED ORDER — THROMBIN 20000 UNITS EX SOLR
CUTANEOUS | Status: AC
  Filled 2012-01-05: qty 20000

## 2012-01-05 MED ORDER — GLYCOPYRROLATE 0.2 MG/ML IJ SOLN
INTRAMUSCULAR | Status: DC | PRN
  Administered 2012-01-05: 0.2 mg via INTRAVENOUS
  Administered 2012-01-05: 0.4 mg via INTRAVENOUS

## 2012-01-05 MED ORDER — DEXTROSE 5 % IV SOLN
1.5000 g | Freq: Two times a day (BID) | INTRAVENOUS | Status: AC
  Administered 2012-01-05 – 2012-01-06 (×2): 1.5 g via INTRAVENOUS
  Filled 2012-01-05 (×2): qty 1.5

## 2012-01-05 MED ORDER — ATENOLOL 100 MG PO TABS
100.0000 mg | ORAL_TABLET | ORAL | Status: DC
  Administered 2012-01-06 – 2012-01-07 (×2): 100 mg via ORAL
  Filled 2012-01-05 (×3): qty 1

## 2012-01-05 MED ORDER — SODIUM CHLORIDE 0.9 % IV SOLN
10.0000 mg | INTRAVENOUS | Status: DC | PRN
  Administered 2012-01-05: 20 ug/min via INTRAVENOUS

## 2012-01-05 MED ORDER — DEXTRAN 40 IN SALINE 10-0.9 % IV SOLN
INTRAVENOUS | Status: DC | PRN
  Administered 2012-01-05: 500 mL

## 2012-01-05 MED ORDER — MORPHINE SULFATE 2 MG/ML IJ SOLN
INTRAMUSCULAR | Status: AC
  Filled 2012-01-05: qty 1

## 2012-01-05 MED ORDER — 0.9 % SODIUM CHLORIDE (POUR BTL) OPTIME
TOPICAL | Status: DC | PRN
  Administered 2012-01-05: 2000 mL

## 2012-01-05 SURGICAL SUPPLY — 54 items
ADH SKN CLS APL DERMABOND .7 (GAUZE/BANDAGES/DRESSINGS) ×1
ADH SKN CLS LQ APL DERMABOND (GAUZE/BANDAGES/DRESSINGS) ×1
ARTERIAL PRESSURE LINE (MISCELLANEOUS) ×1 IMPLANT
BAG DECANTER FOR FLEXI CONT (MISCELLANEOUS) ×2 IMPLANT
CANISTER SUCTION 2500CC (MISCELLANEOUS) ×2 IMPLANT
CATH ROBINSON RED A/P 18FR (CATHETERS) ×2 IMPLANT
CLIP TI MEDIUM 24 (CLIP) ×2 IMPLANT
CLIP TI WIDE RED SMALL 24 (CLIP) ×2 IMPLANT
CLOTH BEACON ORANGE TIMEOUT ST (SAFETY) ×2 IMPLANT
COVER PROBE W GEL 5X96 (DRAPES) ×1 IMPLANT
COVER SURGICAL LIGHT HANDLE (MISCELLANEOUS) ×2 IMPLANT
CRADLE DONUT ADULT HEAD (MISCELLANEOUS) ×2 IMPLANT
DERMABOND ADHESIVE PROPEN (GAUZE/BANDAGES/DRESSINGS) ×1
DERMABOND ADVANCED (GAUZE/BANDAGES/DRESSINGS) ×1
DERMABOND ADVANCED .7 DNX12 (GAUZE/BANDAGES/DRESSINGS) ×1 IMPLANT
DERMABOND ADVANCED .7 DNX6 (GAUZE/BANDAGES/DRESSINGS) IMPLANT
DRAPE WARM FLUID 44X44 (DRAPE) ×2 IMPLANT
ELECT REM PT RETURN 9FT ADLT (ELECTROSURGICAL) ×2
ELECTRODE REM PT RTRN 9FT ADLT (ELECTROSURGICAL) ×1 IMPLANT
GLOVE BIO SURGEON STRL SZ 6.5 (GLOVE) ×1 IMPLANT
GLOVE BIO SURGEON STRL SZ7 (GLOVE) ×2 IMPLANT
GLOVE BIOGEL PI IND STRL 7.0 (GLOVE) IMPLANT
GLOVE BIOGEL PI IND STRL 7.5 (GLOVE) ×1 IMPLANT
GLOVE BIOGEL PI INDICATOR 7.0 (GLOVE) ×3
GLOVE BIOGEL PI INDICATOR 7.5 (GLOVE) ×2
GLOVE SURG SS PI 7.5 STRL IVOR (GLOVE) ×2 IMPLANT
GOWN STRL NON-REIN LRG LVL3 (GOWN DISPOSABLE) ×5 IMPLANT
HEMOSTAT SURGICEL 2X14 (HEMOSTASIS) IMPLANT
KIT BASIN OR (CUSTOM PROCEDURE TRAY) ×2 IMPLANT
KIT ROOM TURNOVER OR (KITS) ×2 IMPLANT
NDL HYPO 25GX1X1/2 BEV (NEEDLE) IMPLANT
NEEDLE HYPO 25GX1X1/2 BEV (NEEDLE) ×2 IMPLANT
NS IRRIG 1000ML POUR BTL (IV SOLUTION) ×4 IMPLANT
PACK CAROTID (CUSTOM PROCEDURE TRAY) ×2 IMPLANT
PAD ARMBOARD 7.5X6 YLW CONV (MISCELLANEOUS) ×4 IMPLANT
SET COLLECT BLD 21X3/4 12 (NEEDLE) ×2 IMPLANT
SHUNT CAROTID BYPASS 10 (VASCULAR PRODUCTS) IMPLANT
SHUNT CAROTID BYPASS 12FRX15.5 (VASCULAR PRODUCTS) IMPLANT
SPECIMEN JAR SMALL (MISCELLANEOUS) ×2 IMPLANT
SPONGE SURGIFOAM ABS GEL 100 (HEMOSTASIS) IMPLANT
STOPCOCK 4 WAY LG BORE MALE ST (IV SETS) ×1 IMPLANT
SUT ETHILON 3 0 PS 1 (SUTURE) IMPLANT
SUT MNCRL AB 4-0 PS2 18 (SUTURE) ×2 IMPLANT
SUT PROLENE 6 0 BV (SUTURE) ×4 IMPLANT
SUT PROLENE 7 0 BV 1 (SUTURE) IMPLANT
SUT VIC AB 3-0 SH 27 (SUTURE) ×2
SUT VIC AB 3-0 SH 27X BRD (SUTURE) ×1 IMPLANT
SYR CONTROL 10ML LL (SYRINGE) ×1 IMPLANT
SYR TB 1ML LUER SLIP (SYRINGE) IMPLANT
SYSTEM CHEST DRAIN TLS 7FR (DRAIN) ×1 IMPLANT
TOWEL OR 17X24 6PK STRL BLUE (TOWEL DISPOSABLE) ×3 IMPLANT
TOWEL OR 17X26 10 PK STRL BLUE (TOWEL DISPOSABLE) ×2 IMPLANT
TRAY FOLEY CATH 14FRSI W/METER (CATHETERS) ×2 IMPLANT
WATER STERILE IRR 1000ML POUR (IV SOLUTION) ×2 IMPLANT

## 2012-01-05 NOTE — OR Nursing (Addendum)
Patient displayed equal bilateral strength in hands and feet, tongue midline; pre-op and post-op. 

## 2012-01-05 NOTE — H&P (Signed)
[facility] [place]  Brief History and Physical  History of Present Illness  Kristen Bradley is a 66 y.o. female who presents with chief complaint: B ICA >80% stenosis (R>L).  The patient presents today for R CEA.    Past Medical History  Diagnosis Date  . High cholesterol     takes Lopid daily  . Carotid artery disease   . Tobacco abuse   . Breast cancer     s/p lumpectomy, XRT  . Renal artery stenosis     Right renal artery stent 05/31/06  . Hepatitis C   . Coronary artery disease     1 stent   . Hypertension     takes Losartan,Amlodipine,HCTZ,and Atenolol daily and Clatarpres  . Joint pain     legs  . History of UTI     takes Diflucan dail--pt states no uti in a couple of yrs though  . Diabetes mellitus     takes Metformin daily    Past Surgical History  Procedure Date  . Adrenal gland surgery   . Abdominal hysterectomy   . Breast lumpectomy     right  . Back surgery   . Coronary angioplasty 2008    1 stent    History   Social History  . Marital Status: Divorced    Spouse Name: N/A    Number of Children: 1  . Years of Education: N/A   Occupational History  . Retired-truck driver    Social History Main Topics  . Smoking status: Current Every Day Smoker -- 0.5 packs/day for 50 years    Types: Cigarettes  . Smokeless tobacco: Never Used     Comment: pt states " I will quit on my own"  . Alcohol Use: No  . Drug Use: No  . Sexually Active: Yes    Birth Control/ Protection: Surgical   Other Topics Concern  . Not on file   Social History Narrative  . No narrative on file    Family History  Problem Relation Age of Onset  . Cancer Mother     ? type  . Cancer Father     ? type  . Diabetes Sister   . Diabetes Brother   . CAD Neg Hx     No current facility-administered medications on file prior to encounter.   Current Outpatient Prescriptions on File Prior to Encounter  Medication Sig Dispense Refill  . amLODipine (NORVASC)  10 MG tablet Take 10 mg by mouth at bedtime.        Marland Kitchen aspirin EC 81 MG tablet Take 81 mg by mouth daily.        Marland Kitchen atenolol (TENORMIN) 100 MG tablet Take 100 mg by mouth every morning.        . cloNIDine (CATAPRES) 0.1 MG tablet Take 0.1 mg by mouth 2 (two) times daily.        . fluconazole (DIFLUCAN) 100 MG tablet Take 100 mg by mouth daily.       Marland Kitchen gemfibrozil (LOPID) 600 MG tablet Take 600 mg by mouth 2 (two) times daily.        . hydrochlorothiazide (HYDRODIURIL) 12.5 MG tablet Take 12.5 mg by mouth daily.       Marland Kitchen losartan (COZAAR) 100 MG tablet Take 100 mg by mouth daily.       . metFORMIN (GLUCOPHAGE) 500 MG tablet Take 500 mg by mouth 2 (two) times daily with a meal.        .  traMADol (ULTRAM) 50 MG tablet Take 50 mg by mouth every 6 (six) hours as needed. For pain        No Known Allergies  Review of Systems: As listed above, otherwise negative.  Physical Examination  Filed Vitals:   01/05/12 0642  BP: 157/69  Pulse: 52  Temp: 98.3 F (36.8 C)  TempSrc: Oral  Resp: 18  SpO2: 100%    General: A&O x 3, WDWN  Pulmonary: Sym exp, good air movt, CTAB, no rales, rhonchi, & wheezing  Cardiac: RRR, Nl S1, S2, no Murmurs, rubs or gallops  Gastrointestinal: soft, NTND, -G/R, - HSM, - masses, - CVAT B  Musculoskeletal: M/S 5/5 throughout , Extremities without ischemic changes   Laboratory See iStat  Medical Decision Making  Kristen Bradley is a 66 y.o. female who presents with: B ICA stenosis >80% (R>L).   The patient is scheduled for: R CEA  I discussed with the patient the risks, benefits, and alternatives to carotid endarterectomy.    I discussed the procedural details of carotid endarterectomy with the patient.  The patient is aware that the risks of carotid endarterectomy include but are not limited to: bleeding, infection, stroke, myocardial infarction, death, cranial nerve injuries both temporary and permanent, neck hematoma, possible airway compromise, labile blood  pressure post-operatively, cerebral hyperperfusion syndrome, and possible need for additional interventions in the future.   In this patient with B high grade stenoses, she is at higher risk for cerebral hyperperfusion.  I also discussed with her the importance of maximal medical management as she has arch and great vessel atherosclerosis and some degree of intracranial disease on both sides.  The distal R ICA disease may make placement of a shunt unsafe, due to risk of embolization caused by the shunt disturbing the proximal stenosis.  The patient is aware of the risks and agrees to proceed forward with the procedure.  Adele Barthel, MD [facility] [place] Office: [phone] Pager: [phone]  01/05/2012, 8:20 AM

## 2012-01-05 NOTE — OR Nursing (Signed)
Intraoperative Ultrasound procedure during Right Carotid Endarterectomy

## 2012-01-05 NOTE — Anesthesia Postprocedure Evaluation (Signed)
  Anesthesia Post-op Note  Patient: Kristen Bradley  Procedure(s) Performed: Procedure(s) (LRB) with comments: ENDARTERECTOMY CAROTID (Right) - Right Carotid Artery Endarterectomy, right stump pressure, intraoperative ultrasound  Patient Location: PACU  Anesthesia Type:General  Level of Consciousness: sedated, patient cooperative and responds to stimulation and voice  Airway and Oxygen Therapy: Patient Spontanous Breathing and Patient connected to nasal cannula oxygen  Post-op Pain: none  Post-op Assessment: Post-op Vital signs reviewed, Patient's Cardiovascular Status Stable, Respiratory Function Stable, Patent Airway, No signs of Nausea or vomiting and Pain level controlled  Post-op Vital Signs: Reviewed and stable  Complications: No apparent anesthesia complications

## 2012-01-05 NOTE — Progress Notes (Signed)
Utilization review completed.  

## 2012-01-05 NOTE — Discharge Summary (Signed)
[facility] Discharge Summary   Patient ID:  Kristen Bradley MRN: [id number] DOB/AGE: [id number] y.o.  Admit date: 01/05/2012 Discharge date: 01/07/12 Date of Surgery: 01/05/2012 Surgeon: Surgeon(s): Conrad , MD  Admission Diagnosis: RIGHT ICA STENOSIS  Discharge Diagnoses:  RIGHT ICA STENOSIS  Secondary Diagnoses: Past Medical History  Diagnosis Date  . High cholesterol     takes Lopid daily  . Carotid artery disease   . Tobacco abuse   . Breast cancer     s/p lumpectomy, XRT  . Renal artery stenosis     Right renal artery stent 05/31/06  . Hepatitis C   . Coronary artery disease     1 stent   . Hypertension     takes Losartan,Amlodipine,HCTZ,and Atenolol daily and Clatarpres  . Joint pain     legs  . History of UTI     takes Diflucan dail--pt states no uti in a couple of yrs though  . Diabetes mellitus     takes Metformin daily    Procedure(s): Right ENDARTERECTOMY CAROTID  Discharged Condition: good  HPI: History of Present Illness: Kristen Bradley is a 66 y.o. female who presents with chief complaint: B carotid stenosis. Previous carotid studies demonstrated: RICA 0000000 stenosis, LICA 0000000 stenosis. On 11/05/11 the Carotid duplex revealed a significant increase in both Carotid arteries with the right at 80-99%. CTA confirmed > 80% carotid stenosis bilaterally. Patient has no history of TIA or stroke symptom. The patient has never had amaurosis fugax or monocular blindness. The patient has never had facial drooping or hemiplegia. The patient has never had receptive or expressive aphasia. The patient's risks factors for carotid disease include: HTN, DM, Hypercholesterolism, and active smoking.  Pt is Right hand dominant   She was seen by Dr Glennie Hawk on 12/14/11 for Cardiac clearance:  1. Pre-operative cardiac examination: She has multiple risk factors for CAD including HTN, HLD, DM, known severe PAD including bilateral carotid artery stenosis,  bilateral lower extremity arterial disease and aortic arch disease. She had a normal stress test December 2012 in [facility]. She refuses to have another stress test in the future. Echo with normal LV function and no wall motion abnormalities. No further cardiac workup. Proceed with planned surgical procedure.   Kristen Bradley is a 66 y.o. female who presents with chief complaint: B ICA >80% stenosis (R>L). The patient presents today for R CEA   Hospital Course:  Kristen Bradley is a 66 y.o. female is S/P Right Procedure(s): ENDARTERECTOMY CAROTID Extubated: POD # 0 Post-op wounds healing well Pt. Ambulating, voiding and taking PO diet without difficulty. Neuro exam: slight facial droop otherwise WNL with good and equal strength Pt pain controlled with PO pain meds. Labs as below Complications:none  Consults:     Significant Diagnostic Studies: CBC Lab Results  Component Value Date   WBC 6.7 12/27/2011   HGB 12.9 12/27/2011   HCT 39.0 12/27/2011   MCV 87.1 12/27/2011   PLT 120* 12/27/2011    BMET    Component Value Date/Time   NA 141 12/27/2011 1157   K 4.4 12/27/2011 1157   CL 104 12/27/2011 1157   CO2 28 12/27/2011 1157   GLUCOSE 109* 12/27/2011 1157   BUN 16 12/27/2011 1157   CREATININE 0.74 12/27/2011 1157   CREATININE 0.82 11/05/2011 1208   CALCIUM 11.0* 12/27/2011 1157   GFRNONAA 88* 12/27/2011 1157   GFRAA >90 12/27/2011 1157   COAG Lab Results  Component Value  Date   INR 0.80 12/27/2011   INR 0.90 01/16/2010   INR 0.9 08/25/2007     Disposition:  Discharge to :Home   Kristen Bradley, Kristen Bradley  Home Medication Instructions [id number]   Printed on:01/05/12 1138  Medication Information                    cloNIDine (CATAPRES) 0.1 MG tablet Take 0.1 mg by mouth 2 (two) times daily.             amLODipine (NORVASC) 10 MG tablet Take 10 mg by mouth at bedtime.             atenolol (TENORMIN) 100 MG tablet Take 100 mg by mouth every morning.              aspirin EC 81 MG tablet Take 81 mg by mouth daily.             metFORMIN (GLUCOPHAGE) 500 MG tablet Take 500 mg by mouth 2 (two) times daily with a meal.             gemfibrozil (LOPID) 600 MG tablet Take 600 mg by mouth 2 (two) times daily.             fluconazole (DIFLUCAN) discontinue while on plavix          hydrochlorothiazide (HYDRODIURIL) 12.5 MG tablet Take 12.5 mg by mouth daily.            losartan (COZAAR) 100 MG tablet Take 100 mg by mouth daily.            traMADol (ULTRAM) 50 MG tablet Take 1 tablet (50 mg total) by mouth every 6 (six) hours as needed. For pain           Plavix 75mg  once daily by mouth   Verbal and written Discharge instructions given to the patient. Wound care per Discharge AVS  F/U 2 weeks Dr [name] - discussion re: surgical intervention for left side at that time  Signed: Richrd Prime 01/05/2012, 11:38 AM    Addendum Note to D/C Kristen Bradley is a 66 y.o. female who is S/P Procedure(s): ENDARTERECTOMY CAROTID. The patient is stable and ready for discharge.  No changes to History, Physical Exam, or D/C instructions Pharmacy suggests DC Diflucan while on Plavix  ROCZNIAK,REGINA J 01/07/2012 8:01 AM  Addendum  I have independently interviewed and examined the patient, and I agree with the physician assistant's findings.  This patient underwent an uneventful R CEA.  She was observed in the hospital for development of cerebral hyperperfusion syndrome one additional day, given the labile blood pressure and headache she had developed.  Her blood pressures remained acceptable and her headache improved.  She is neurologically intact and no hematoma is present at the surgical site.  She will follow up in 2 weeks.  Adele Barthel, MD [facility] [place] Office: [phone] Pager: [phone]  01/07/2012, 8:40 AM

## 2012-01-05 NOTE — Telephone Encounter (Signed)
Sent letter, could not leave msg on home #- pt still in [facility], dpm

## 2012-01-05 NOTE — Transfer of Care (Signed)
Immediate Anesthesia Transfer of Care Note  Patient: Kristen Bradley  Procedure(s) Performed: Procedure(s) (LRB) with comments: ENDARTERECTOMY CAROTID (Right) - Right Carotid Artery Endarterectomy, right stump pressure, intraoperative ultrasound  Patient Location: PACU  Anesthesia Type:General  Level of Consciousness: awake, alert  and oriented  Airway & Oxygen Therapy: Patient Spontanous Breathing and Patient connected to nasal cannula oxygen  Post-op Assessment: Report given to PACU RN, Post -op Vital signs reviewed and stable and Patient moving all extremities  Post vital signs: Reviewed and stable  Complications: No apparent anesthesia complications

## 2012-01-05 NOTE — Anesthesia Preprocedure Evaluation (Addendum)
Anesthesia Evaluation  Patient identified by MRN, date of birth, ID band Patient awake    Reviewed: Allergy & Precautions, H&P , NPO status , Patient's Chart, lab work & pertinent test results, reviewed documented beta blocker date and time   History of Anesthesia Complications Negative for: history of anesthetic complications  Airway Mallampati: II TM Distance: >3 FB Neck ROM: Full    Dental  (+) Edentulous Upper, Partial Lower, Poor Dentition and Dental Advisory Given   Pulmonary Current Smoker,  breath sounds clear to auscultation  Pulmonary exam normal       Cardiovascular hypertension, Pt. on medications and Pt. on home beta blockers + CAD and + Peripheral Vascular Disease (carotid stenosis, for CEA) Rhythm:Regular Rate:Normal  11/13 ECHO: LVH, EF 60%, valves OK   Neuro/Psych negative neurological ROS  negative psych ROS   GI/Hepatic negative GI ROS, Neg liver ROS,   Endo/Other  diabetes (glu 154), Well Controlled, Type 2, Oral Hypoglycemic Agents  Renal/GU Renal diseasenegative Renal ROS     Musculoskeletal   Abdominal   Peds  Hematology   Anesthesia Other Findings Breast cancer: surgery, xrt  Reproductive/Obstetrics                          Anesthesia Physical Anesthesia Plan  ASA: III  Anesthesia Plan: General   Post-op Pain Management:    Induction: Intravenous  Airway Management Planned: Oral ETT  Additional Equipment: Arterial line  Intra-op Plan:   Post-operative Plan: Extubation in OR  Informed Consent: I have reviewed the patients History and Physical, chart, labs and discussed the procedure including the risks, benefits and alternatives for the proposed anesthesia with the patient or authorized representative who has indicated his/her understanding and acceptance.   Dental advisory given  Plan Discussed with: CRNA, Anesthesiologist and Surgeon  Anesthesia Plan  Comments: (Plan routine monitors, A line, GETA)       Anesthesia Quick Evaluation

## 2012-01-05 NOTE — Preoperative (Signed)
Beta Blockers   Reason not to administer Beta Blockers:Atenolol at 0530 01/05/12

## 2012-01-05 NOTE — Op Note (Signed)
OPERATIVE NOTE  PROCEDURE:   1.  right carotid endarterectomy with primary repair 2.  right intraoperative carotid ultrasound  PRE-OPERATIVE DIAGNOSIS: right asymptomatic carotid stenosis >80%  POST-OPERATIVE DIAGNOSIS: same as above   SURGEON: Adele Barthel, MD  ASSISTANT(S): Wray Kearns, [facility]   ANESTHESIA: general  ESTIMATED BLOOD LOSS: 50 cc  FINDING(S): 1.  Continuous Doppler audible flow signatures are appropriate for each carotid artery. 2.  No evidence of intimal flap visualized on transverse or longitudinal ultrasonography. 3.  Calcified nearly occluded carotid plaque.  SPECIMEN(S):  Carotid plaque (sent to Pathology)  INDICATIONS:   Kristen Bradley is a 66 y.o. female who presents with bilateral asymptomatic carotid stenosis >80%, right > left.  I discussed with the patient the risks, benefits, and alternatives to carotid endarterectomy.  I discussed the procedural details of carotid endarterectomy with the patient.  The patient is aware that the risks of carotid endarterectomy include but are not limited to: bleeding, infection, stroke, myocardial infarction, death, cranial nerve injuries both temporary and permanent, neck hematoma, possible airway compromise, labile blood pressure post-operatively, cerebral hyperperfusion syndrome, and possible need for additional interventions in the future. The patient is aware of the risks and agrees to proceed forward with the procedure.  DESCRIPTION: After full informed written consent was obtained from the patient, the patient was brought back to the operating room and placed supine upon the operating table.  Prior to induction, the patient received IV antibiotics.  After obtaining adequate anesthesia, the patient was placed into semi-Fowler position with a shoulder roll in place and the patient's neck slightly hyperextended and rotated away from the surgical site.  The patient was prepped in the standard fashion for a right carotid  endarterectomy.  I made an incision anterior to the sternocleidomastoid muscle and dissected down through the subcutaneous tissue.  The platysmas was opened with electrocautery.  Then I dissected down to the internal jugular vein.  This was dissected posteriorly until I obtained visualization of the common carotid artery.  This common carotid artery was found to be ectatic, ~1.2 cm in diameter,  This was dissected out and then an umbilical tape was placed around the common carotid artery and I loosely applied a Rumel tourniquet.  I then dissected in a periadventitial fashion along the common carotid artery up to the bifurcation.  I then identified the external carotid artery and the superior thyroid artery.  A 2-0 silk tie was looped around the superior thyroid artery, and I also dissected out the external carotid artery and placed a vessel loop around it.  In continuing the dissection to the internal carotid artery, I identified the facial vein.  This was ligated and then transected, giving me improved exposure of the internal carotid artery.  In the process of this dissection, the hypoglossal nerve was identified.   It was draped lower than usually over the bifurcation, so I had to sharply mobilized the tissue around the nerve, taking care to avoid injurying the nerve.  I tied off the occipital artery to facilitate the mobilization.   I then dissected out the internal carotid artery until I identified an area of soft tissue in the internal carotid artery, which was about 2 cm distal tot eh bifurcation.  I dissected slightly distal to this area, and placed a vessel loop around the artery.  Due to the presence of a proximal tandem lesion in the right internal carotid artery, I did not think a shunt could be placed due to embolization risk.  At this point, we gave the patient a therapeutic bolus of Heparin intravenously (roughly 80 units/kg).  I obtain tubing and a butterfly needle to complete a stump pressure.  The  tubing was passed off to the anesthesia team to an arterial line setup.   After waiting 3 minutes, then I clamped the external carotid artery and then the common carotid artery.  I then cannulated the common carotid artery with the butterfly needle.  The stump pressure was >100 mm Hg, which was adequate for clamping with shunt.  I then clamped the internal carotid artery under direct visualization, taking care to avoid injurying the hypoglossal nerve.  I then made an arteriotomy in the common carotid artery with a 11 blade, and extended the arteriotomy with a Potts scissor down into the common carotid artery, then I carried the arteriotomy through the bifurcation into the internal carotid artery until I reached an area that was not diseased.  At this point, I started the endarterectomy in the common carotid artery with a Technical brewer and carried this dissection down into the common carotid artery circumferentially.  Then I transected the plaque at a segment where it was adherent.  I then carried this dissection up into the external carotid artery.  The plaque was extracted by unclamping the external carotid artery and everting the artery.  The dissection was then carried into the internal carotid artery, extracting the remaining portion of the carotid plaque.  I passed the plaque off the field as a specimen.  I then spent the next 30 minutes removing intimal flaps and loose debris.  Eventually I reached the point where the residual plaque was densely adherent and any further dissection would compromise the integrity of the wall.  Note, due to the ectatic carotid wall and severe calcification of the plaque, the residual arterial wall was thin but of adequate integrity.  After verifying that there was no more loose intimal flaps or debris, I re-interrogated the entirety of this carotid artery.  At this point, I was satisfied that the minimal remaining disease was densely adherent to the wall and wall integrity  was intact.  There was thin, densely adherent layer of plaque in the distal internal carotid artery which could not be dissected off safely without injurying the arterial wall.  This distal endpoint was also already behind the mandible at this level.  Due to the ectatic size of this carotid system, no patch was necessary.  I repaired the endarterectomy with a running stitch of 6-0 prolene sewn from both ends and tied in the middle.  At this point, I allowed all carotid arteries to backbleed.  Blood flow from each was excellent.  Then I instilled heparinized saline in this artery and then completed the primary repair in the usual fashion.  First, I released the clamp on the external carotid artery, then I released it on the common carotid artery.  After waiting a few seconds, I then released it on the internal carotid artery.  I then interrogated this patient's arteries with the continuous Doppler.  The audible waveforms in each artery were consistent with the expected characteristics for each artery.  The Sonosite probe was then sterilely draped and used to interrogate the carotid artery in both longitudinal and transverse views.  At this point, I washed out the wound, and placed thrombin and Gelfoam throughout.  I also gave the patient 30 mg of protamine to reverse his anticoagulation.   After waiting a few minutes, I removed the  thrombin and Gelfoam and washed out the wound.  There was no more active bleeding in the surgical site.   I then reapproximated the platysma muscle with a running stitch of 3-0 Vicryl.  The skin was then reapproximated with a running subcuticular 4-0 Monocryl stitch.  The skin was then cleaned, dried and Dermabond was used to reinforce the skin closure.  The patient woke without any problems, neurologically intact.     COMPLICATIONS: none  CONDITION: stable  Adele Barthel, MD [facility] [place] Office: [phone] Pager: [phone]  01/05/2012, 11:15  AM

## 2012-01-05 NOTE — Telephone Encounter (Signed)
Message copied by Gena Fray on Wed Jan 05, 2012  3:13 PM ------      Message from: Alfonso Patten      Created: Wed Jan 05, 2012 12:11 PM                   ----- Message -----         From: Richrd Prime, PA         Sent: 01/05/2012  11:37 AM           To: Alfonso Patten, RN            2 week F/U - CEA - [name]

## 2012-01-06 ENCOUNTER — Encounter (HOSPITAL_COMMUNITY): Payer: Self-pay | Admitting: Vascular Surgery

## 2012-01-06 LAB — BASIC METABOLIC PANEL
Chloride: 108 mEq/L (ref 96–112)
Creatinine, Ser: 0.75 mg/dL (ref 0.50–1.10)
GFR calc Af Amer: >90 mL/min (ref >90)
GFR calc non Af Amer: 88 mL/min — ABNORMAL LOW (ref >90)
Potassium: 3.4 mEq/L — ABNORMAL LOW (ref 3.5–5.1)

## 2012-01-06 LAB — CBC
Platelets: 120 10*3/uL — ABNORMAL LOW (ref 150–400)
RDW: 14.1 % (ref 11.5–15.5)
WBC: 6.7 10*3/uL (ref 4.0–10.5)

## 2012-01-06 LAB — GLUCOSE, CAPILLARY
Glucose-Capillary: 120 mg/dL — ABNORMAL HIGH (ref 70–99)
Glucose-Capillary: 142 mg/dL — ABNORMAL HIGH (ref 70–99)
Glucose-Capillary: 154 mg/dL — ABNORMAL HIGH (ref 70–99)
Glucose-Capillary: 137 mg/dL — ABNORMAL HIGH (ref 70–99)

## 2012-01-06 MED ORDER — ENOXAPARIN SODIUM 40 MG/0.4ML ~~LOC~~ SOLN
40.0000 mg | SUBCUTANEOUS | Status: DC
  Administered 2012-01-06: 40 mg via SUBCUTANEOUS
  Filled 2012-01-06 (×2): qty 0.4

## 2012-01-06 MED ORDER — HYDROCHLOROTHIAZIDE 12.5 MG PO CAPS
12.5000 mg | ORAL_CAPSULE | Freq: Every day | ORAL | Status: DC
  Administered 2012-01-06 – 2012-01-07 (×2): 12.5 mg via ORAL
  Filled 2012-01-06 (×2): qty 1

## 2012-01-06 MED ORDER — FAMOTIDINE 20 MG PO TABS
20.0000 mg | ORAL_TABLET | Freq: Two times a day (BID) | ORAL | Status: DC
  Administered 2012-01-06 – 2012-01-07 (×3): 20 mg via ORAL
  Filled 2012-01-06 (×4): qty 1

## 2012-01-06 NOTE — Progress Notes (Signed)
Report called to Jeneen Rinks, receiving RN on 2000.  Pt transferred to 2027 via wheelchair with all belongings. Pt's son accompanied pt to new room.   Vista Lawman, RN

## 2012-01-06 NOTE — Progress Notes (Addendum)
VASCULAR AND VEIN SURGERY POST - OP CEA PROGRESS NOTE  Date of Surgery: 01/05/2012  Surgeon(s): Conrad Bancroft, MD 1 Day Post-Op right Carotid Endarterectomy .  HPI: Kristen Bradley is a 66 y.o. female who is 1 Day Post-Op right Carotid Endarterectomy . Patient is doing well. Son notes smile is flat on right side. Patient reports mild headache; Patient denies difficulty swallowing; denies weakness in upper or lower extremities; Pt. denies other symptoms of stroke or TIA.  IMAGING: No results found.  Significant Diagnostic Studies: CBC Lab Results  Component Value Date   WBC 6.7 01/06/2012   HGB 12.1 01/06/2012   HCT 36.5 01/06/2012   MCV 85.5 01/06/2012   PLT 120* 01/06/2012    BMET    Component Value Date/Time   NA 142 01/06/2012 0355   K 3.4* 01/06/2012 0355   CL 108 01/06/2012 0355   CO2 24 01/06/2012 0355   GLUCOSE 137* 01/06/2012 0355   BUN 21 01/06/2012 0355   CREATININE 0.75 01/06/2012 0355   CREATININE 0.82 11/05/2011 1208   CALCIUM 10.2 01/06/2012 0355   GFRNONAA 88* 01/06/2012 0355   GFRAA >90 01/06/2012 0355    COAG Lab Results  Component Value Date   INR 0.80 12/27/2011   INR 0.90 01/16/2010   INR 0.9 08/25/2007   No results found for this basename: PTT      Intake/Output Summary (Last 24 hours) at 01/06/12 0745 Last data filed at 01/06/12 [id number]  Gross per 24 hour  Intake   2900 ml  Output   2710 ml  Net    190 ml    Physical Exam:  BP Readings from Last 3 Encounters:  01/06/12 133/47  01/06/12 133/47  12/27/11 165/81   Temp Readings from Last 3 Encounters:  01/06/12 98.6 F (37 C) Oral  01/06/12 98.6 F (37 C) Oral  12/27/11 98.2 F (36.8 C)    SpO2 Readings from Last 3 Encounters:  01/06/12 97%  01/06/12 97%  12/27/11 100%   Pulse Readings from Last 3 Encounters:  01/06/12 67  01/06/12 67  12/27/11 59    Pt is A&O x 3 Gait is normal Speech is fluent right Neck Wound is healing well Patient with Negative tongue deviation and Positive  facial droop Pt has good and equal strength in all extremities  Assessment/Plan:: Kristen Bradley is a 66 y.o. female is S/P Right Carotid endarterectomy Pt is voiding, ambulating and taking po well   Discharge to: Home if ambulates and takes po Follow-up in 2 weeks   [place] [phone] 01/06/2012 7:45 AM  Addendum  Left angle of mouth flat, no droop per se. Normal right facial motor movement.  CN 2-12 intact.  M/S 5/5 symmetric.  BP have been up and down (SBP 130-170s).  Pt also with headache currently.  As precaution, would observe an additional day for cerebral hyperperfusion syndrome, though I doubt it.  Transfer to flr.  Adele Barthel, MD [facility] [place] Office: [phone] Pager: [phone]  01/06/2012, 9:01 AM

## 2012-01-06 NOTE — Progress Notes (Signed)
Nutrition Brief Note  Patient identified on the Malnutrition Screening Tool (MST) Report.  Weight has been stable per readings below:  Wt Readings from Last 10 Encounters:  01/05/12 146 lb 9.7 oz (66.5 kg)  01/05/12 146 lb 9.7 oz (66.5 kg)  12/27/11 143 lb 1.6 oz (64.91 kg)  12/17/11 142 lb (64.411 kg)  12/14/11 142 lb (64.411 kg)  11/30/11 139 lb (63.05 kg)  11/26/11 143 lb (64.864 kg)  11/05/11 142 lb 14.4 oz (64.819 kg)  03/12/11 138 lb (62.596 kg)  12/31/10 143 lb (64.864 kg)    Body mass index is 24.40 kg/(m^2). Pt meets criteria for Normal based on current BMI.   Current diet order is Carbohydrate Modified Medium Calorie.  Labs and medications reviewed.   No nutrition interventions warranted at this time.  Please consult RD as needed.  Phillips Odor, RD, LDN Pager #: [phone] After-Hours Pager #: [phone]

## 2012-01-07 LAB — GLUCOSE, CAPILLARY: Glucose-Capillary: 151 mg/dL — ABNORMAL HIGH (ref 70–99)

## 2012-01-07 NOTE — Progress Notes (Signed)
Patient and Family spoke with Rinaldo Cloud, PA regarding BP concerns; addressed. [place] for d/c home. D/c instructions along with med list and scripts provided. IV d/c'd with catheter intact. Transported via wheelchair to main lobby for d/c home. Cindee Salt

## 2012-01-07 NOTE — Care Management Note (Signed)
    Page 1 of 1   01/07/2012     1:22:42 PM   CARE MANAGEMENT NOTE 01/07/2012  Patient:  Kristen Bradley, Kristen Bradley   Account Number:  [id number]  Date Initiated:  01/07/2012  Documentation initiated by:  Mira Balon  Subjective/Objective Assessment:   PT S/P RT CEA ON 01/05/12.  PTA, PT INDEPENDENT, LIVES WITH SPOUSE.     Action/Plan:   NO HOME NEEDS ASSESSED.  PT FOR DC HOME TODAY.   Anticipated DC Date:  01/07/2012   Anticipated DC Plan:  [place]  CM consult      Choice offered to / List presented to:             Status of service:  Completed, signed off Medicare Important Message given?   (If response is "NO", the following Medicare IM given date fields will be blank) Date Medicare IM given:   Date Additional Medicare IM given:    Discharge Disposition:  HOME/SELF CARE  Per UR Regulation:    If discussed at Long Length of Stay Meetings, dates discussed:    Comments:

## 2012-01-07 NOTE — Progress Notes (Addendum)
VASCULAR AND VEIN SURGERY POST - OP CEA PROGRESS NOTE  Date of Surgery: 01/05/2012  Surgeon(s): Conrad [place], MD 2 Days Post-Op right Carotid Endarterectomy .  HPI: Kristen Bradley is a 66 y.o. female who is 2 Days Post-Op right Carotid Endarterectomy . Patient is doing well. Patient reports headache; Patient denies difficulty swallowing; denies weakness in upper or lower extremities; Pt. denies other symptoms of stroke or TIA.  IMAGING: No results found.  Significant Diagnostic Studies: CBC Lab Results  Component Value Date   WBC 6.7 01/06/2012   HGB 12.1 01/06/2012   HCT 36.5 01/06/2012   MCV 85.5 01/06/2012   PLT 120* 01/06/2012    BMET    Component Value Date/Time   NA 142 01/06/2012 0355   K 3.4* 01/06/2012 0355   CL 108 01/06/2012 0355   CO2 24 01/06/2012 0355   GLUCOSE 137* 01/06/2012 0355   BUN 21 01/06/2012 0355   CREATININE 0.75 01/06/2012 0355   CREATININE 0.82 11/05/2011 1208   CALCIUM 10.2 01/06/2012 0355   GFRNONAA 88* 01/06/2012 0355   GFRAA >90 01/06/2012 0355    COAG Lab Results  Component Value Date   INR 0.80 12/27/2011   INR 0.90 01/16/2010   INR 0.9 08/25/2007   No results found for this basename: PTT      Intake/Output Summary (Last 24 hours) at 01/07/12 0755 Last data filed at 01/07/12 0054  Gross per 24 hour  Intake    720 ml  Output   1375 ml  Net   -655 ml    Physical Exam:  BP Readings from Last 3 Encounters:  01/07/12 149/71  01/07/12 149/71  12/27/11 165/81   Temp Readings from Last 3 Encounters:  01/07/12 98 F (36.7 C) Oral  01/07/12 98 F (36.7 C) Oral  12/27/11 98.2 F (36.8 C)    SpO2 Readings from Last 3 Encounters:  01/07/12 100%  01/07/12 100%  12/27/11 100%   Pulse Readings from Last 3 Encounters:  01/07/12 64  01/07/12 64  12/27/11 59    Pt is A&O x 3 Gait is normal Speech is fluent right Neck Wound is healing well Patient with Negative tongue deviation and Positive facial droop Pt has good and equal  strength in all extremities  Assessment/Plan:: Kristen Bradley is a 66 y.o. female is S/P Right Carotid endarterectomy Pt is voiding, ambulating and taking po well Mild reperfusion H/A Facial droop unchanged  Pt seen by Dr. [name] this am  Discharge to: Home Follow-up in 2 weeks   Richrd Prime [id number] 01/07/2012 7:55 AM  Addendum  I have independently interviewed and examined the patient, and I agree with the physician assistant's findings.  D/C today  Adele Barthel, MD [facility]: [phone] Pager: [phone]  01/07/2012, 8:43 AM  Chronic HTN with pre-operative average SBP 140's to 180's as per ER, cardiac and vasc office notes  Continue BP meds as she takes at home. Do want to maintain BP as pre-op for cerebral perfusion  Instructions given to pt and son to take BP at home in am and afternoon; record and see PCP to review meds in 1 week Call PCP if SBP>190

## 2012-01-20 ENCOUNTER — Encounter: Payer: Self-pay | Admitting: Vascular Surgery

## 2012-01-21 ENCOUNTER — Other Ambulatory Visit: Payer: Self-pay | Admitting: *Deleted

## 2012-01-21 ENCOUNTER — Encounter: Payer: Self-pay | Admitting: *Deleted

## 2012-01-21 ENCOUNTER — Ambulatory Visit (INDEPENDENT_AMBULATORY_CARE_PROVIDER_SITE_OTHER): Payer: Medicare HMO | Admitting: Vascular Surgery

## 2012-01-21 ENCOUNTER — Encounter: Payer: Self-pay | Admitting: Vascular Surgery

## 2012-01-21 VITALS — BP 118/57 | HR 55 | Temp 97.8°F | Ht 65.0 in | Wt 145.0 lb

## 2012-01-21 DIAGNOSIS — I6529 Occlusion and stenosis of unspecified carotid artery: Secondary | ICD-10-CM

## 2012-01-21 NOTE — Progress Notes (Signed)
  VASCULAR AND VEIN SURGERY PROGRESS NOTE  Progress note  Date of Surgery:S/P right carotid endarterectomy 01-05-2012 by Dr. [name].   HPI: Kristen Bradley is a 66 y.o. female is here for a 2 week follow up.  Her biggest complaint is left facial droop that has been present since her surgery.  She reports no weakness of the  extremities.  No difficulty swallowing or slurred speech.    Physical Examination Cranial nerves intact. No facial droop with full smile on the left or right. Left facial droop at rest only on the left. Upper and lower extremities strength equal 5/5. N/V/M intact. Right neck incision well healed no hematoma present.    Assessment/Plan [place] Kristen Bradley is a 66 y.o. year old female who is S/P right carotid endarterectomy.    She has > than 80% stenosis on the left as well.  She will be scheduled for left carotid endarterectomy in 1 month.  Dr. [name] explained that the left facial droop is unrelated to the R CEA.    Laurence Slate [facility] 01/21/2012 11:07 AM  Addendum  I have independently interviewed and examined the patient, and I agree with the physician assistant's findings.  On exam, the patient has some asx in the face but when asked to smile she has a sym smile and and is able to raise her eyebrows equally.  I discussed with the patient the differential diagnosis and reassured her that it was not related to the R CEA.  Her neurological exam was completely normal with symmetric motor.  While there is some possibility of a small CVA as the etiology, no intervention other than medical optimization, which is already occurring, would be completed as she has no neurologic loss.  Her L CEA will be completed on 7 JAN 13 to give her one full month to stable her BP prior to proceeding.  Hopefully this will decrease her risk of cerebral hyperperfusion which she is at risk for given B high grade stenoses.  Adele Barthel, MD [facility] [place] Office:  [phone] Pager: [phone]  01/21/2012, 11:21 AM

## 2012-01-28 ENCOUNTER — Encounter (HOSPITAL_COMMUNITY): Payer: Self-pay | Admitting: Respiratory Therapy

## 2012-02-03 ENCOUNTER — Encounter (HOSPITAL_COMMUNITY)
Admission: RE | Admit: 2012-02-03 | Discharge: 2012-02-03 | Disposition: A | Discharge status: Home / Self Care | Payer: Medicare HMO | Source: Ambulatory Visit | Attending: Vascular Surgery | Admitting: Vascular Surgery

## 2012-02-03 ENCOUNTER — Encounter (HOSPITAL_COMMUNITY): Payer: Self-pay

## 2012-02-03 LAB — CBC
HCT: 34.7 % — ABNORMAL LOW (ref 36.0–46.0)
MCH: 27.9 pg (ref 26.0–34.0)
MCV: 85.7 fL (ref 78.0–100.0)
Platelets: 139 10*3/uL — ABNORMAL LOW (ref 150–400)
RDW: 14.1 % (ref 11.5–15.5)
WBC: 3.8 10*3/uL — ABNORMAL LOW (ref 4.0–10.5)

## 2012-02-03 LAB — COMPREHENSIVE METABOLIC PANEL
Albumin: 3.4 g/dL — ABNORMAL LOW (ref 3.5–5.2)
BUN: 22 mg/dL (ref 6–23)
Calcium: 10.2 mg/dL (ref 8.4–10.5)
Chloride: 103 mEq/L (ref 96–112)
Creatinine, Ser: 0.96 mg/dL (ref 0.50–1.10)
GFR calc non Af Amer: 61 mL/min — ABNORMAL LOW (ref >90)
Total Bilirubin: 0.5 mg/dL (ref 0.3–1.2)

## 2012-02-03 LAB — URINALYSIS, ROUTINE W REFLEX MICROSCOPIC
Glucose, UA: NEGATIVE mg/dL
Leukocytes, UA: NEGATIVE
Protein, ur: >300 mg/dL — AB
Specific Gravity, Urine: 1.02 (ref 1.005–1.030)
Urobilinogen, UA: 1 mg/dL (ref 0.0–1.0)

## 2012-02-03 LAB — URINE MICROSCOPIC-ADD ON

## 2012-02-03 LAB — SURGICAL PCR SCREEN: Staphylococcus aureus: NEGATIVE

## 2012-02-03 NOTE — Pre-Procedure Instructions (Signed)
[place]  02/03/2012    Your procedure is scheduled on: Tuesday, January 7th.    Report to [facility] at 5:30 AM.  Call this number if you have problems the morning of surgery: [phone]   Remember: Nothing to eat or drink after Midnight.      Take these medicines the morning of surgery with A SIP OF WATER: Amlodipine (Norvasc),  Clonidine (Catapres).  May take Tramadol (Ultram) if needed.    Do not wear jewelry, make-up or nail polish.  Do not wear lotions, powders, or perfumes. You may wear deodorant.  Do not shave 48 hours prior to surgery. Men may shave face and neck.  Do not bring valuables to the hospital.  Contacts, dentures or bridgework may not be worn into surgery.  Leave suitcase in the car. After surgery it may be brought to your room.  For patients admitted to the hospital, checkout time is 11:00 AM the day of discharge.   Patients discharged the day of surgery will not be allowed to drive home.  Name and phone number of your driver: NA   Special Instructions: Shower using CHG 2 nights before surgery and the night before surgery.  If you shower the day of surgery use CHG.  Use special wash - you have one bottle of CHG for all showers.  You should use approximately 1/3 of the bottle for each shower.   Please read over the following fact sheets that you were given: Pain Booklet, Coughing and Deep Breathing and Surgical Site Infection Prevention

## 2012-02-07 MED ORDER — DEXTROSE 5 % IV SOLN
1.5000 g | INTRAVENOUS | Status: AC
  Administered 2012-02-08: 1.5 g via INTRAVENOUS
  Filled 2012-02-07: qty 1.5

## 2012-02-08 ENCOUNTER — Encounter (HOSPITAL_COMMUNITY): Payer: Self-pay | Admitting: Anesthesiology

## 2012-02-08 ENCOUNTER — Inpatient Hospital Stay (HOSPITAL_COMMUNITY): Payer: Medicare HMO | Admitting: Anesthesiology

## 2012-02-08 ENCOUNTER — Inpatient Hospital Stay (HOSPITAL_COMMUNITY)
Admission: RE | Admit: 2012-02-08 | Discharge: 2012-02-09 | DRG: 038 | Disposition: A | Discharge status: Home / Self Care | Payer: Medicare HMO | Source: Ambulatory Visit | Attending: Vascular Surgery | Admitting: Vascular Surgery

## 2012-02-08 ENCOUNTER — Encounter (HOSPITAL_COMMUNITY)
Admission: RE | Disposition: A | Discharge status: Home / Self Care | Payer: Self-pay | Source: Ambulatory Visit | Attending: Vascular Surgery

## 2012-02-08 ENCOUNTER — Telehealth: Payer: Self-pay | Admitting: Vascular Surgery

## 2012-02-08 DIAGNOSIS — I639 Cerebral infarction, unspecified: Secondary | ICD-10-CM | POA: Insufficient documentation

## 2012-02-08 DIAGNOSIS — I2510 Atherosclerotic heart disease of native coronary artery without angina pectoris: Secondary | ICD-10-CM | POA: Diagnosis present

## 2012-02-08 DIAGNOSIS — B1920 Unspecified viral hepatitis C without hepatic coma: Secondary | ICD-10-CM | POA: Diagnosis present

## 2012-02-08 DIAGNOSIS — I6529 Occlusion and stenosis of unspecified carotid artery: Principal | ICD-10-CM | POA: Diagnosis present

## 2012-02-08 DIAGNOSIS — Z87440 Personal history of urinary (tract) infections: Secondary | ICD-10-CM

## 2012-02-08 DIAGNOSIS — Z87891 Personal history of nicotine dependence: Secondary | ICD-10-CM

## 2012-02-08 DIAGNOSIS — Z888 Allergy status to other drugs, medicaments and biological substances status: Secondary | ICD-10-CM

## 2012-02-08 DIAGNOSIS — I10 Essential (primary) hypertension: Secondary | ICD-10-CM | POA: Diagnosis present

## 2012-02-08 DIAGNOSIS — E7800 Pure hypercholesterolemia, unspecified: Secondary | ICD-10-CM | POA: Diagnosis present

## 2012-02-08 DIAGNOSIS — M25569 Pain in unspecified knee: Secondary | ICD-10-CM | POA: Diagnosis present

## 2012-02-08 DIAGNOSIS — Z90710 Acquired absence of both cervix and uterus: Secondary | ICD-10-CM

## 2012-02-08 DIAGNOSIS — Z79899 Other long term (current) drug therapy: Secondary | ICD-10-CM

## 2012-02-08 DIAGNOSIS — Z853 Personal history of malignant neoplasm of breast: Secondary | ICD-10-CM

## 2012-02-08 DIAGNOSIS — E119 Type 2 diabetes mellitus without complications: Secondary | ICD-10-CM | POA: Diagnosis present

## 2012-02-08 DIAGNOSIS — I739 Peripheral vascular disease, unspecified: Secondary | ICD-10-CM | POA: Diagnosis present

## 2012-02-08 DIAGNOSIS — Z7982 Long term (current) use of aspirin: Secondary | ICD-10-CM

## 2012-02-08 DIAGNOSIS — Z7902 Long term (current) use of antithrombotics/antiplatelets: Secondary | ICD-10-CM

## 2012-02-08 DIAGNOSIS — N390 Urinary tract infection, site not specified: Secondary | ICD-10-CM | POA: Diagnosis present

## 2012-02-08 DIAGNOSIS — Z9861 Coronary angioplasty status: Secondary | ICD-10-CM

## 2012-02-08 HISTORY — DX: Cerebral infarction, unspecified: I63.9

## 2012-02-08 HISTORY — PX: PATCH ANGIOPLASTY: SHX6230

## 2012-02-08 HISTORY — PX: ENDARTERECTOMY: SHX5162

## 2012-02-08 LAB — URINALYSIS, ROUTINE W REFLEX MICROSCOPIC
Ketones, ur: NEGATIVE mg/dL
Leukocytes, UA: NEGATIVE
Nitrite: NEGATIVE
Specific Gravity, Urine: 1.02 (ref 1.005–1.030)
pH: 5 (ref 5.0–8.0)

## 2012-02-08 LAB — TYPE AND SCREEN
ABO/RH(D): O POS
Antibody Screen: NEGATIVE

## 2012-02-08 LAB — PROTIME-INR: INR: 0.99 (ref 0.00–1.49)

## 2012-02-08 LAB — GLUCOSE, CAPILLARY
Glucose-Capillary: 177 mg/dL — ABNORMAL HIGH (ref 70–99)
Glucose-Capillary: 143 mg/dL — ABNORMAL HIGH (ref 70–99)

## 2012-02-08 SURGERY — ENDARTERECTOMY, CAROTID
Anesthesia: General | Site: Neck | Laterality: Left | Wound class: Clean

## 2012-02-08 MED ORDER — DOCUSATE SODIUM 100 MG PO CAPS
100.0000 mg | ORAL_CAPSULE | Freq: Every day | ORAL | Status: DC
  Administered 2012-02-09: 100 mg via ORAL
  Filled 2012-02-08: qty 1

## 2012-02-08 MED ORDER — MIDAZOLAM HCL 2 MG/2ML IJ SOLN: 0.5000 mg | Freq: Once | INTRAMUSCULAR | Status: DC | PRN

## 2012-02-08 MED ORDER — LABETALOL HCL 5 MG/ML IV SOLN: 10.0000 mg | INTRAVENOUS | Status: DC | PRN

## 2012-02-08 MED ORDER — PHENYLEPHRINE HCL 10 MG/ML IJ SOLN
10.0000 mg | INTRAVENOUS | Status: DC | PRN
  Administered 2012-02-08: 20 ug/min via INTRAVENOUS

## 2012-02-08 MED ORDER — DOPAMINE-DEXTROSE 3.2-5 MG/ML-% IV SOLN: 3.0000 ug/kg/min | INTRAVENOUS | Status: DC | PRN

## 2012-02-08 MED ORDER — TRAMADOL HCL 50 MG PO TABS
50.0000 mg | ORAL_TABLET | Freq: Four times a day (QID) | ORAL | Status: DC | PRN
  Administered 2012-02-08 – 2012-02-09 (×2): 50 mg via ORAL
  Filled 2012-02-08 (×2): qty 1

## 2012-02-08 MED ORDER — ROCURONIUM BROMIDE 100 MG/10ML IV SOLN
INTRAVENOUS | Status: DC | PRN
  Administered 2012-02-08: 50 mg via INTRAVENOUS

## 2012-02-08 MED ORDER — MIDAZOLAM HCL 5 MG/5ML IJ SOLN
INTRAMUSCULAR | Status: DC | PRN
  Administered 2012-02-08: 2 mg via INTRAVENOUS

## 2012-02-08 MED ORDER — ATENOLOL 100 MG PO TABS
100.0000 mg | ORAL_TABLET | Freq: Once | ORAL | Status: AC
  Administered 2012-02-08: 100 mg via ORAL
  Filled 2012-02-08: qty 1

## 2012-02-08 MED ORDER — POTASSIUM CHLORIDE CRYS ER 20 MEQ PO TBCR: 20.0000 meq | EXTENDED_RELEASE_TABLET | Freq: Once | ORAL | Status: AC | PRN

## 2012-02-08 MED ORDER — SIMVASTATIN 40 MG PO TABS
40.0000 mg | ORAL_TABLET | Freq: Every day | ORAL | Status: DC
  Filled 2012-02-08: qty 1

## 2012-02-08 MED ORDER — SODIUM CHLORIDE 0.9 % IV SOLN: 500.0000 mL | Freq: Once | INTRAVENOUS | Status: AC | PRN

## 2012-02-08 MED ORDER — HYDRALAZINE HCL 20 MG/ML IJ SOLN: 10.0000 mg | INTRAMUSCULAR | Status: DC | PRN

## 2012-02-08 MED ORDER — ACETAMINOPHEN 325 MG PO TABS
325.0000 mg | ORAL_TABLET | ORAL | Status: DC | PRN
  Administered 2012-02-09: 650 mg via ORAL
  Filled 2012-02-08: qty 2

## 2012-02-08 MED ORDER — SODIUM CHLORIDE 0.9 % IV SOLN
INTRAVENOUS | Status: DC
  Administered 2012-02-08: 16:00:00 via INTRAVENOUS

## 2012-02-08 MED ORDER — GLYCOPYRROLATE 0.2 MG/ML IJ SOLN
INTRAMUSCULAR | Status: DC | PRN
  Administered 2012-02-08: 0.4 mg via INTRAVENOUS

## 2012-02-08 MED ORDER — TRAMADOL HCL 50 MG PO TABS: 50.0000 mg | ORAL_TABLET | Freq: Four times a day (QID) | ORAL | Status: DC | PRN

## 2012-02-08 MED ORDER — ACETAMINOPHEN 650 MG RE SUPP: 325.0000 mg | RECTAL | Status: DC | PRN

## 2012-02-08 MED ORDER — PROPOFOL 10 MG/ML IV BOLUS
INTRAVENOUS | Status: DC | PRN
  Administered 2012-02-08: 70 mg via INTRAVENOUS

## 2012-02-08 MED ORDER — THROMBIN 20000 UNITS EX SOLR
CUTANEOUS | Status: DC | PRN
  Administered 2012-02-08: 10:00:00 via TOPICAL

## 2012-02-08 MED ORDER — METOPROLOL TARTRATE 1 MG/ML IV SOLN: 2.0000 mg | INTRAVENOUS | Status: DC | PRN

## 2012-02-08 MED ORDER — CIPROFLOXACIN HCL 500 MG PO TABS
500.0000 mg | ORAL_TABLET | Freq: Two times a day (BID) | ORAL | Status: DC
  Administered 2012-02-08 – 2012-02-09 (×2): 500 mg via ORAL
  Filled 2012-02-08 (×5): qty 1

## 2012-02-08 MED ORDER — FENTANYL CITRATE 0.05 MG/ML IJ SOLN
INTRAMUSCULAR | Status: DC | PRN
  Administered 2012-02-08: 150 ug via INTRAVENOUS
  Administered 2012-02-08: 100 ug via INTRAVENOUS

## 2012-02-08 MED ORDER — FENTANYL CITRATE 0.05 MG/ML IJ SOLN
INTRAMUSCULAR | Status: AC
  Filled 2012-02-08: qty 2

## 2012-02-08 MED ORDER — ONDANSETRON HCL 4 MG/2ML IJ SOLN
INTRAMUSCULAR | Status: DC | PRN
  Administered 2012-02-08: 4 mg via INTRAVENOUS

## 2012-02-08 MED ORDER — INSULIN ASPART 100 UNIT/ML ~~LOC~~ SOLN
0.0000 [IU] | Freq: Three times a day (TID) | SUBCUTANEOUS | Status: DC
  Administered 2012-02-08: 2 [IU] via SUBCUTANEOUS

## 2012-02-08 MED ORDER — OXYCODONE HCL 5 MG PO TABS: 5.0000 mg | ORAL_TABLET | Freq: Once | ORAL | Status: DC | PRN

## 2012-02-08 MED ORDER — CIPROFLOXACIN IN D5W 400 MG/200ML IV SOLN
400.0000 mg | Freq: Once | INTRAVENOUS | Status: AC
  Administered 2012-02-08: 400 mg via INTRAVENOUS
  Filled 2012-02-08: qty 200

## 2012-02-08 MED ORDER — LIDOCAINE HCL (CARDIAC) 20 MG/ML IV SOLN
INTRAVENOUS | Status: DC | PRN
  Administered 2012-02-08: 30 mg via INTRAVENOUS

## 2012-02-08 MED ORDER — OXYCODONE HCL 5 MG/5ML PO SOLN: 5.0000 mg | Freq: Once | ORAL | Status: DC | PRN

## 2012-02-08 MED ORDER — DEXTRAN 40 IN SALINE 10-0.9 % IV SOLN
INTRAVENOUS | Status: DC | PRN
  Administered 2012-02-08: 500 mL

## 2012-02-08 MED ORDER — MORPHINE SULFATE 2 MG/ML IJ SOLN
2.0000 mg | INTRAMUSCULAR | Status: DC | PRN
  Administered 2012-02-08 (×2): 4 mg via INTRAVENOUS
  Administered 2012-02-08: 2 mg via INTRAVENOUS
  Filled 2012-02-08 (×2): qty 2

## 2012-02-08 MED ORDER — MEPERIDINE HCL 25 MG/ML IJ SOLN: 6.2500 mg | INTRAMUSCULAR | Status: DC | PRN

## 2012-02-08 MED ORDER — LACTATED RINGERS IV SOLN
INTRAVENOUS | Status: DC | PRN
  Administered 2012-02-08 (×2): via INTRAVENOUS

## 2012-02-08 MED ORDER — 0.9 % SODIUM CHLORIDE (POUR BTL) OPTIME
TOPICAL | Status: DC | PRN
  Administered 2012-02-08: 2000 mL

## 2012-02-08 MED ORDER — LOSARTAN POTASSIUM 50 MG PO TABS
100.0000 mg | ORAL_TABLET | Freq: Every day | ORAL | Status: DC
  Administered 2012-02-09: 100 mg via ORAL
  Filled 2012-02-08: qty 2

## 2012-02-08 MED ORDER — HYDROCHLOROTHIAZIDE 25 MG PO TABS
25.0000 mg | ORAL_TABLET | Freq: Every day | ORAL | Status: DC
  Administered 2012-02-09: 25 mg via ORAL
  Filled 2012-02-08: qty 1

## 2012-02-08 MED ORDER — NEOSTIGMINE METHYLSULFATE 1 MG/ML IJ SOLN
INTRAMUSCULAR | Status: DC | PRN
  Administered 2012-02-08: 3 mg via INTRAVENOUS

## 2012-02-08 MED ORDER — AMLODIPINE BESYLATE 10 MG PO TABS
10.0000 mg | ORAL_TABLET | Freq: Every day | ORAL | Status: DC
Start: 2012-02-08 — End: 2012-02-09
  Administered 2012-02-08: 10 mg via ORAL
  Filled 2012-02-08 (×2): qty 1

## 2012-02-08 MED ORDER — THROMBIN 20000 UNITS EX SOLR
CUTANEOUS | Status: AC
  Filled 2012-02-08: qty 20000

## 2012-02-08 MED ORDER — CLONIDINE HCL 0.1 MG PO TABS
0.1000 mg | ORAL_TABLET | Freq: Two times a day (BID) | ORAL | Status: DC
  Administered 2012-02-08 – 2012-02-09 (×2): 0.1 mg via ORAL
  Filled 2012-02-08 (×3): qty 1

## 2012-02-08 MED ORDER — LIDOCAINE HCL (PF) 1 % IJ SOLN
INTRAMUSCULAR | Status: AC
  Filled 2012-02-08: qty 30

## 2012-02-08 MED ORDER — ASPIRIN EC 81 MG PO TBEC
81.0000 mg | DELAYED_RELEASE_TABLET | Freq: Every day | ORAL | Status: DC
  Administered 2012-02-09: 81 mg via ORAL
  Filled 2012-02-08: qty 1

## 2012-02-08 MED ORDER — MORPHINE SULFATE 2 MG/ML IJ SOLN
INTRAMUSCULAR | Status: AC
  Administered 2012-02-08: 2 mg via INTRAVENOUS
  Filled 2012-02-08: qty 1

## 2012-02-08 MED ORDER — METFORMIN HCL 500 MG PO TABS
500.0000 mg | ORAL_TABLET | Freq: Two times a day (BID) | ORAL | Status: DC
  Administered 2012-02-09: 500 mg via ORAL
  Filled 2012-02-08 (×3): qty 1

## 2012-02-08 MED ORDER — ONDANSETRON HCL 4 MG/2ML IJ SOLN: 4.0000 mg | Freq: Four times a day (QID) | INTRAMUSCULAR | Status: DC | PRN

## 2012-02-08 MED ORDER — PHENOL 1.4 % MT LIQD: 1.0000 | OROMUCOSAL | Status: DC | PRN

## 2012-02-08 MED ORDER — CLOPIDOGREL BISULFATE 75 MG PO TABS
75.0000 mg | ORAL_TABLET | Freq: Every day | ORAL | Status: DC
  Administered 2012-02-09: 75 mg via ORAL
  Filled 2012-02-08 (×2): qty 1

## 2012-02-08 MED ORDER — PROMETHAZINE HCL 25 MG/ML IJ SOLN: 6.2500 mg | INTRAMUSCULAR | Status: DC | PRN

## 2012-02-08 MED ORDER — GUAIFENESIN-DM 100-10 MG/5ML PO SYRP: 15.0000 mL | ORAL_SOLUTION | ORAL | Status: DC | PRN

## 2012-02-08 MED ORDER — HEPARIN SODIUM (PORCINE) 1000 UNIT/ML IJ SOLN
INTRAMUSCULAR | Status: DC | PRN
  Administered 2012-02-08: 5000 [IU] via INTRAVENOUS

## 2012-02-08 MED ORDER — FENTANYL CITRATE 0.05 MG/ML IJ SOLN
25.0000 ug | INTRAMUSCULAR | Status: DC | PRN
  Administered 2012-02-08: 50 ug via INTRAVENOUS

## 2012-02-08 MED ORDER — ATENOLOL 100 MG PO TABS
100.0000 mg | ORAL_TABLET | Freq: Every day | ORAL | Status: DC
  Administered 2012-02-08: 100 mg via ORAL
  Filled 2012-02-08 (×2): qty 1

## 2012-02-08 MED ORDER — DEXTRAN 40 IN SALINE 10-0.9 % IV SOLN
25.0000 mL/h | INTRAVENOUS | Status: DC
  Filled 2012-02-08: qty 500

## 2012-02-08 MED ORDER — GEMFIBROZIL 600 MG PO TABS
600.0000 mg | ORAL_TABLET | Freq: Two times a day (BID) | ORAL | Status: DC
  Administered 2012-02-09: 600 mg via ORAL
  Filled 2012-02-08 (×2): qty 1

## 2012-02-08 MED ORDER — PROTAMINE SULFATE 10 MG/ML IV SOLN
INTRAVENOUS | Status: DC | PRN
  Administered 2012-02-08: 30 mg via INTRAVENOUS

## 2012-02-08 MED ORDER — LIDOCAINE HCL 4 % MT SOLN
OROMUCOSAL | Status: DC | PRN
  Administered 2012-02-08: 4 mL via TOPICAL

## 2012-02-08 MED ORDER — SODIUM CHLORIDE 0.9 % IR SOLN
Status: DC | PRN
  Administered 2012-02-08: 09:00:00

## 2012-02-08 SURGICAL SUPPLY — 57 items
ADH SKN CLS APL DERMABOND .7 (GAUZE/BANDAGES/DRESSINGS) ×1
BAG DECANTER FOR FLEXI CONT (MISCELLANEOUS) ×2 IMPLANT
CANISTER SUCTION 2500CC (MISCELLANEOUS) ×2 IMPLANT
CATH ROBINSON RED A/P 18FR (CATHETERS) ×2 IMPLANT
CATH SUCT 10FR WHISTLE TIP (CATHETERS) ×1 IMPLANT
CLIP TI MEDIUM 24 (CLIP) ×2 IMPLANT
CLIP TI WIDE RED SMALL 24 (CLIP) ×2 IMPLANT
CLOTH BEACON ORANGE TIMEOUT ST (SAFETY) ×2 IMPLANT
COVER PROBE W GEL 5X96 (DRAPES) ×1 IMPLANT
COVER SURGICAL LIGHT HANDLE (MISCELLANEOUS) ×2 IMPLANT
CRADLE DONUT ADULT HEAD (MISCELLANEOUS) ×2 IMPLANT
DERMABOND ADVANCED (GAUZE/BANDAGES/DRESSINGS) ×1
DERMABOND ADVANCED .7 DNX12 (GAUZE/BANDAGES/DRESSINGS) ×1 IMPLANT
DRAPE WARM FLUID 44X44 (DRAPE) ×2 IMPLANT
ELECT REM PT RETURN 9FT ADLT (ELECTROSURGICAL) ×2
ELECTRODE REM PT RTRN 9FT ADLT (ELECTROSURGICAL) ×1 IMPLANT
GLOVE BIO SURGEON STRL SZ 6.5 (GLOVE) ×1 IMPLANT
GLOVE BIO SURGEON STRL SZ7 (GLOVE) ×2 IMPLANT
GLOVE BIOGEL PI IND STRL 6.5 (GLOVE) IMPLANT
GLOVE BIOGEL PI IND STRL 7.0 (GLOVE) IMPLANT
GLOVE BIOGEL PI IND STRL 7.5 (GLOVE) ×1 IMPLANT
GLOVE BIOGEL PI INDICATOR 6.5 (GLOVE) ×2
GLOVE BIOGEL PI INDICATOR 7.0 (GLOVE) ×2
GLOVE BIOGEL PI INDICATOR 7.5 (GLOVE) ×3
GLOVE ECLIPSE 6.5 STRL STRAW (GLOVE) ×2 IMPLANT
GLOVE ECLIPSE 7.0 STRL STRAW (GLOVE) ×1 IMPLANT
GOWN STRL NON-REIN LRG LVL3 (GOWN DISPOSABLE) ×6 IMPLANT
HEMOSTAT SURGICEL 2X14 (HEMOSTASIS) IMPLANT
KIT BASIN OR (CUSTOM PROCEDURE TRAY) ×2 IMPLANT
KIT ROOM TURNOVER OR (KITS) ×2 IMPLANT
NS IRRIG 1000ML POUR BTL (IV SOLUTION) ×4 IMPLANT
PACK CAROTID (CUSTOM PROCEDURE TRAY) ×2 IMPLANT
PAD ARMBOARD 7.5X6 YLW CONV (MISCELLANEOUS) ×4 IMPLANT
PATCH VASCULAR VASCU GUARD 1X6 (Vascular Products) ×2 IMPLANT
SET COLLECT BLD 21X3/4 12 (NEEDLE) ×1 IMPLANT
SET COLLECT BLD 21X3/4 12 PB (MISCELLANEOUS) ×1 IMPLANT
SHUNT CAROTID BYPASS 10 (VASCULAR PRODUCTS) IMPLANT
SHUNT CAROTID BYPASS 12FRX15.5 (VASCULAR PRODUCTS) IMPLANT
SPECIMEN JAR SMALL (MISCELLANEOUS) ×2 IMPLANT
SPONGE SURGIFOAM ABS GEL 100 (HEMOSTASIS) ×1 IMPLANT
STOPCOCK 4 WAY LG BORE MALE ST (IV SETS) ×1 IMPLANT
SUT ETHILON 3 0 PS 1 (SUTURE) IMPLANT
SUT MNCRL AB 4-0 PS2 18 (SUTURE) ×2 IMPLANT
SUT PROLENE 6 0 BV (SUTURE) ×2 IMPLANT
SUT PROLENE 7 0 BV 1 (SUTURE) ×1 IMPLANT
SUT SILK 3 0 TIES 17X18 (SUTURE) ×2
SUT SILK 3-0 18XBRD TIE BLK (SUTURE) IMPLANT
SUT VIC AB 3-0 SH 27 (SUTURE) ×2
SUT VIC AB 3-0 SH 27X BRD (SUTURE) ×1 IMPLANT
SYR TB 1ML LUER SLIP (SYRINGE) IMPLANT
SYSTEM CHEST DRAIN TLS 7FR (DRAIN) IMPLANT
TOWEL OR 17X24 6PK STRL BLUE (TOWEL DISPOSABLE) ×2 IMPLANT
TOWEL OR 17X26 10 PK STRL BLUE (TOWEL DISPOSABLE) ×2 IMPLANT
TRAY FOLEY CATH 14FRSI W/METER (CATHETERS) ×2 IMPLANT
TUBING ART PRESS 48 MALE/FEM (TUBING) ×1 IMPLANT
TUBING EXTENTION W/L.L. (IV SETS) ×1 IMPLANT
WATER STERILE IRR 1000ML POUR (IV SOLUTION) ×2 IMPLANT

## 2012-02-08 NOTE — Anesthesia Preprocedure Evaluation (Addendum)
Anesthesia Evaluation  Patient identified by MRN, date of birth, ID band Patient awake    Reviewed: Allergy & Precautions, H&P , NPO status , Patient's Chart, lab work & pertinent test results, reviewed documented beta blocker date and time   History of Anesthesia Complications Negative for: history of anesthetic complications  Airway Mallampati: II TM Distance: >3 FB Neck ROM: Full    Dental  (+) Edentulous Upper, Dental Advisory Given, Poor Dentition, Missing and Chipped,    Pulmonary former smoker,  breath sounds clear to auscultation  Pulmonary exam normal       Cardiovascular hypertension, Pt. on medications and Pt. on home beta blockers + CAD, + Cardiac Stents and + Peripheral Vascular Disease Rhythm:Regular Rate:Normal  11/13: EF 60-65%, valves OK   Neuro/Psych Facial weakness negative psych ROS   GI/Hepatic negative GI ROS, (+) Hepatitis -, C  Endo/Other  diabetes (glu 177), Well Controlled, Type 2, Oral Hypoglycemic Agents  Renal/GU Renal artery stenosis     Musculoskeletal   Abdominal   Peds  Hematology   Anesthesia Other Findings   Reproductive/Obstetrics                         Anesthesia Physical Anesthesia Plan  ASA: III  Anesthesia Plan: General   Post-op Pain Management:    Induction: Intravenous  Airway Management Planned: Oral ETT  Additional Equipment: Arterial line  Intra-op Plan:   Post-operative Plan: Extubation in OR  Informed Consent: I have reviewed the patients History and Physical, chart, labs and discussed the procedure including the risks, benefits and alternatives for the proposed anesthesia with the patient or authorized representative who has indicated his/her understanding and acceptance.     Plan Discussed with: CRNA and Surgeon  Anesthesia Plan Comments: (Plan routine monitors, A line, GETA)        Anesthesia Quick Evaluation

## 2012-02-08 NOTE — H&P (Signed)
[facility] [place]  Brief History and Physical  History of Present Illness  Kristen Bradley is a 67 y.o. female who presents with chief complaint: asx L ICA stenosis > 80%.  The patient presents today for L CEA.    Past Medical History  Diagnosis Date  . High cholesterol     takes Lopid daily  . Carotid artery disease   . Tobacco abuse   . Breast cancer     s/p lumpectomy, XRT  . Renal artery stenosis     Right renal artery stent 05/31/06  . Hepatitis C   . Coronary artery disease     1 stent   . Hypertension     takes Losartan,Amlodipine,HCTZ,and Atenolol daily and Clatarpres  . Joint pain     legs  . History of UTI     takes Diflucan dail--pt states no uti in a couple of yrs though  . Diabetes mellitus     takes Metformin daily    Past Surgical History  Procedure Date  . Adrenal gland surgery   . Abdominal hysterectomy   . Breast lumpectomy     right  . Back surgery   . Coronary angioplasty 2008    1 stent  . Endarterectomy 01/05/2012    Procedure: ENDARTERECTOMY CAROTID;  Surgeon: Conrad Sargent, MD;  Location: [place];  Service: Vascular;  Laterality: Right;  Right Carotid Artery Endarterectomy, right stump pressure, intraoperative ultrasound    History   Social History  . Marital Status: Divorced    Spouse Name: N/A    Number of Children: 1  . Years of Education: N/A   Occupational History  . Retired-truck driver    Social History Main Topics  . Smoking status: Former Smoker -- 0.0 packs/day for 50 years    Types: Cigarettes    Quit date: 12/29/2011  . Smokeless tobacco: Not on file     Comment: pt states " I will quit on my own"  . Alcohol Use: No  . Drug Use: No  . Sexually Active: Yes    Birth Control/ Protection: Surgical   Other Topics Concern  . Not on file   Social History Narrative  . No narrative on file    Family History  Problem Relation Age of Onset  . Cancer Mother     ? type  . Cancer Father     ? type    . Diabetes Sister   . Diabetes Brother   . CAD Neg Hx     No current facility-administered medications on file prior to encounter.   Current Outpatient Prescriptions on File Prior to Encounter  Medication Sig Dispense Refill  . amLODipine (NORVASC) 10 MG tablet Take 10 mg by mouth at bedtime.        Marland Kitchen aspirin EC 81 MG tablet Take 81 mg by mouth daily.        Marland Kitchen atenolol (TENORMIN) 100 MG tablet Take 100 mg by mouth every morning.        . cloNIDine (CATAPRES) 0.1 MG tablet Take 0.1 mg by mouth 2 (two) times daily.        . clopidogrel (PLAVIX) 75 MG tablet Take 1 tablet (75 mg total) by mouth daily.  30 tablet  11  . gemfibrozil (LOPID) 600 MG tablet Take 600 mg by mouth 2 (two) times daily.        Marland Kitchen losartan (COZAAR) 100 MG tablet Take 100 mg by mouth daily.       Marland Kitchen  metFORMIN (GLUCOPHAGE) 500 MG tablet Take 500 mg by mouth 2 (two) times daily with a meal.        . simvastatin (ZOCOR) 40 MG tablet Take 40 mg by mouth daily.       . traMADol (ULTRAM) 50 MG tablet Take 1 tablet (50 mg total) by mouth every 6 (six) hours as needed. For pain  30 tablet  0  . PRODIGY TWIST TOP LANCETS 28G MISC         Allergies  Allergen Reactions  . Chlorhexidine Gluconate Itching    Review of Systems: As listed above, otherwise negative.  Physical Examination  Filed Vitals:   02/08/12 0633  BP: 133/64  Pulse: 74  Temp: 99.4 F (37.4 C)  TempSrc: Oral  Resp: 18  SpO2: 98%    General: A&O x 3, WDWN  Pulmonary: Sym exp, good air movt, CTAB, no rales, rhonchi, & wheezing  Cardiac: RRR, Nl S1, S2, no Murmurs, rubs or gallops  Gastrointestinal: soft, NTND, -G/R, - HSM, - masses, - CVAT B  Musculoskeletal: M/S 5/5 throughout , Extremities without ischemic changes   Neurologic: CN 2-12 intact, left angle of mouth flat but elevates appropriately with smile, motor listed as above  Laboratory See iStat  Medical Decision Making  Kristen Bradley is a 67 y.o. female who presents with: asx L  ICA stenosis >80%.   The patient is scheduled for: L CEA I discussed with the patient the risks, benefits, and alternatives to carotid endarterectomy.   I discussed the procedural details of carotid endarterectomy with the patient.  The patient is aware that the risks of carotid endarterectomy include but are not limited to: bleeding, infection, stroke, myocardial infarction, death, cranial nerve injuries both temporary and permanent, neck hematoma, possible airway compromise, labile blood pressure post-operatively, cerebral hyperperfusion syndrome, and possible need for additional interventions in the future.  The patient is aware of the risks and agrees to proceed forward with the procedure.  I discussed once again that her perceived "facial droop" is unrelated to prior R CEA.  Her physical findings are more consistent with a unresolved Bell's palsy or limited previous CVA or marginal mandibular palsy.  Given no manipulation of the left marginal mandibular nerve occurs as part of a R CEA, this is last option is highly unlikely.  Adele Barthel, MD [facility] [place] Office: [phone] Pager: [phone]  02/08/2012, 7:33 AM

## 2012-02-08 NOTE — Telephone Encounter (Addendum)
Message copied by Lujean Amel on Tue Feb 08, 2012  1:09 PM ------      Message from: Alfonso Patten      Created: Tue Feb 08, 2012 11:34 AM                   ----- Message -----         From: Richrd Prime, PA         Sent: 02/08/2012  11:11 AM           To: Alfonso Patten, RN            2 week F/U CEA-[facility] I scheduled an appt for the above pt on 02/25/12 at 1:15pm with [facility]. I mailed an appt letter and also left a voice message for her.awt

## 2012-02-08 NOTE — Op Note (Addendum)
OPERATIVE NOTE  PROCEDURE:   1.  left carotid endarterectomy with bovine patch angioplasty 2.  left intraoperative carotid ultrasound  PRE-OPERATIVE DIAGNOSIS: left asymptomatic carotid stenosis >80%  POST-OPERATIVE DIAGNOSIS: same as above   SURGEON: Adele Barthel, MD  ASSISTANT(S): Wray Kearns, [facility]  ANESTHESIA: general  ESTIMATED BLOOD LOSS: 100 cc  FINDING(S): 1.  Continuous Doppler audible flow signatures are appropriate for each carotid artery. 2.  No evidence of intimal flap visualized on transverse or longitudinal ultrasonography. 3.  Nearly occluded internal carotid plaque. 4.  Friable common carotid artery segment requiring extended endarterectomy: residual common carotid artery plaque <30% stenosis  SPECIMEN(S):  Carotid plaque (sent to Pathology)  INDICATIONS:   Kristen Bradley is a 67 y.o. female who presents with left asymptomatic carotid stenosis >80%.  I discussed with the patient the risks, benefits, and alternatives to carotid endarterectomy.  I discussed the procedural details of carotid endarterectomy with the patient.  The patient is aware that the risks of carotid endarterectomy include but are not limited to: bleeding, infection, stroke, myocardial infarction, death, cranial nerve injuries both temporary and permanent, neck hematoma, possible airway compromise, labile blood pressure post-operatively, cerebral hyperperfusion syndrome, and possible need for additional interventions in the future. The patient is aware of the risks and agrees to proceed forward with the procedure.  DESCRIPTION: After full informed written consent was obtained from the patient, the patient was brought back to the operating room and placed supine upon the operating table.  Prior to induction, the patient received IV antibiotics.  After obtaining adequate anesthesia, the patient was placed into semi-Fowler position with a shoulder roll in place and the patient's neck slightly hyperextended  and rotated away from the surgical site.  The patient was prepped in the standard fashion for a left carotid endarterectomy.  I made an incision anterior to the sternocleidomastoid muscle and dissected down through the subcutaneous tissue.  The platysmas was opened with electrocautery.  Then I dissected down to the internal jugular vein.  This was dissected posteriorly until I obtained visualization of the common carotid artery.  This was dissected out and then an vessel loop was placed around the common carotid artery.  I then dissected in a periadventitial fashion along the common carotid artery up to the bifurcation.  I then identified the external carotid artery and the superior thyroid artery.  A 2-0 silk tie was looped around the superior thyroid artery, and I also dissected out the external carotid artery and placed a vessel loop around it.  In continuing the dissection to the internal carotid artery, I identified the facial vein.  This was ligated and then transected, giving me improved exposure of the internal carotid artery.  In the process of this dissection, the hypoglossal nerve was identified.  I ligated and transected the occipital artery to facilitate mobilization of the hypoglossal nerve.  I then dissected out the internal carotid artery until I identified an area of soft tissue in the internal carotid artery.  Due to the high carotid bifurcation, I did not think a Rumel tourniquet could be applied to the internal carotid artery.   I dissected slightly distal to relatively disease free portion of the internal carotid artery and placed a vessel loop around the artery.  At this point, we gave the patient a therapeutic bolus of Heparin intravenously (roughly 80 units/kg).  After waiting 3 minutes, then I clamped the external carotid artery and then the common carotid artery.  Using a butterfly needle connected  to the arterial pressure circuit, I cannulated the common carotid artery distal to the clamp.   The stump pressure was measured at: 45 mm Hg.  Based on this measurement, I felt no shunt was needed.  I also had completed the prior right carotid endarterectomy without a shunt.  I then made an arteriotomy in the common carotid artery with a 11 blade, and extended the arteriotomy with a Potts scissor down into the common carotid artery, then I carried the arteriotomy through the bifurcation into the internal carotid artery until I reached an area that was not diseased.  At this point, I started the endarterectomy in the common carotid artery with a Technical brewer and carried this dissection down into the common carotid artery circumferentially.  Due the extent of friable disease in the common carotid artery, I elected to extend the dissection inferior in the neck.  I extended the incision to facilitate this and dissected more proximally on the common carotid artery.  Eventually I found a densely adherent non-friable segment of the plaque proximally.  I transected the plaque at this segment.  I then carried this dissection up into the external carotid artery.  The plaque was extracted by unclamping the external carotid artery and everting the artery.  The dissection was then carried into the internal carotid artery, extracting the remaining portion of the carotid plaque.  I passed the plaque off the field as a specimen.  I then spent the next 30 minutes removing intimal flaps and loose debris.  Eventually I reached the point where the residual plaque was densely adherent and any further dissection would compromise the integrity of the wall.  After verifying that there was no more loose intimal flaps or debris, I re-interrogated the entirety of this carotid artery.  At this point, I was satisfied that the minimal remaining disease was densely adherent to the wall and wall integrity was intact.  At this point, I then fashioned a bovine pericardial patch for the geometry of this artery and sewed it in place with  two running stitch of 6-0 Prolene, one from each end.   Prior to completing the patch angioplasty, I back bled all carotid arteries: excellent bleeding was obtained from all arteries.  I instilled heparinized saline in this patched artery and then completed the patch angioplasty in the usual fashion.  First, I released the clamp on the external carotid artery, then I released it on the common carotid artery.  After waiting a few seconds, I then released it on the internal carotid artery.  I then interrogated this patient's arteries with the continuous Doppler.  The audible waveforms in each artery were consistent with the expected characteristics for each artery.  The Sonosite probe was then sterilely draped and used to interrogate the carotid artery in both longitudinal and transverse views.  At this point, I washed out the wound, and placed thrombin and Gelfoam throughout.  I also gave the patient 30 mg of protamine to reverse his anticoagulation.   After waiting a few minutes, I removed the thrombin and Gelfoam and washed out the wound.  There was no more active bleeding in the surgical site.   I then reapproximated the platysma muscle with a running stitch of 3-0 Vicryl.  The skin was then reapproximated with a running subcuticular 4-0 Monocryl stitch.  The skin was then cleaned, dried and Dermabond was used to reinforce the skin closure.  The patient woke without any problems, neurologically intact.  Note, intraoperatively the patient began running a fever.  Her preoperatively urinalysis was not positive, but there were some clinical findings upon placing the foley that suggested a possible urinary tract infection.  I elected to give the patient Ciprofloxacin 400 mg IV as empiric treatment for an urinary tract infection.  COMPLICATIONS: none  CONDITION: stable  Adele Barthel, MD [facility] [place] Office: [phone] Pager: [phone]  02/08/2012, 10:33 AM

## 2012-02-08 NOTE — Transfer of Care (Signed)
Immediate Anesthesia Transfer of Care Note  Patient: [name]  Procedure(s) Performed: Procedure(s) (LRB) with comments: ENDARTERECTOMY CAROTID (Left) PATCH ANGIOPLASTY (Left) - Carotid artery patch angioplasty using Vascu-Guard bovine patch 1cm x 6cm.  Patient Location: PACU  Anesthesia Type:General  Level of Consciousness: awake, alert  and oriented  Airway & Oxygen Therapy: Patient Spontanous Breathing and Patient connected to nasal cannula oxygen  Post-op Assessment: Report given to PACU RN and Post -op Vital signs reviewed and stable  Post vital signs: Reviewed and stable  Complications: No apparent anesthesia complications

## 2012-02-08 NOTE — Progress Notes (Signed)
Pt arrived from PACU, VSS, neuro & pulses intact, will continue to monitor.

## 2012-02-08 NOTE — Preoperative (Signed)
Beta Blockers   Reason not to administer Beta Blockers:Not Applicable 

## 2012-02-08 NOTE — Progress Notes (Signed)
Call to Pharmacy to order betablocker.

## 2012-02-08 NOTE — Anesthesia Postprocedure Evaluation (Signed)
  Anesthesia Post-op Note  Patient: Kristen Bradley  Procedure(s) Performed: Procedure(s) (LRB) with comments: ENDARTERECTOMY CAROTID (Left) PATCH ANGIOPLASTY (Left) - Carotid artery patch angioplasty using Vascu-Guard bovine patch 1cm x 6cm.  Patient Location: PACU  Anesthesia Type:General  Level of Consciousness: awake, alert , oriented and patient cooperative  Airway and Oxygen Therapy: Patient Spontanous Breathing  Post-op Pain: mild  Post-op Assessment: Post-op Vital signs reviewed, Patient's Cardiovascular Status Stable, Respiratory Function Stable, Patent Airway, No signs of Nausea or vomiting and Pain level controlled  Post-op Vital Signs: Reviewed and stable  Complications: No apparent anesthesia complications

## 2012-02-08 NOTE — Progress Notes (Signed)
Utilization review completed.  

## 2012-02-09 ENCOUNTER — Encounter (HOSPITAL_COMMUNITY): Payer: Self-pay | Admitting: *Deleted

## 2012-02-09 LAB — BASIC METABOLIC PANEL
BUN: 24 mg/dL — ABNORMAL HIGH (ref 6–23)
CO2: 24 mEq/L (ref 19–32)
Chloride: 100 mEq/L (ref 96–112)
Creatinine, Ser: 1.25 mg/dL — ABNORMAL HIGH (ref 0.50–1.10)
Glucose, Bld: 145 mg/dL — ABNORMAL HIGH (ref 70–99)

## 2012-02-09 LAB — CBC
HCT: 26 % — ABNORMAL LOW (ref 36.0–46.0)
Hemoglobin: 8.7 g/dL — ABNORMAL LOW (ref 12.0–15.0)
MCV: 84.7 fL (ref 78.0–100.0)
RBC: 3.07 MIL/uL — ABNORMAL LOW (ref 3.87–5.11)
WBC: 6 10*3/uL (ref 4.0–10.5)

## 2012-02-09 LAB — GLUCOSE, CAPILLARY
Glucose-Capillary: 130 mg/dL — ABNORMAL HIGH (ref 70–99)
Glucose-Capillary: 133 mg/dL — ABNORMAL HIGH (ref 70–99)

## 2012-02-09 MED ORDER — CIPROFLOXACIN HCL 500 MG PO TABS: 500.0000 mg | ORAL_TABLET | Freq: Two times a day (BID) | ORAL | Status: AC

## 2012-02-09 MED ORDER — POTASSIUM CHLORIDE CRYS ER 20 MEQ PO TBCR
20.0000 meq | EXTENDED_RELEASE_TABLET | Freq: Once | ORAL | Status: AC
  Administered 2012-02-09: 20 meq via ORAL
  Filled 2012-02-09: qty 1

## 2012-02-09 MED ORDER — TRAMADOL HCL 50 MG PO TABS: 50.0000 mg | ORAL_TABLET | Freq: Four times a day (QID) | ORAL | Status: DC | PRN

## 2012-02-09 NOTE — Progress Notes (Addendum)
Pt voiding post foley removal, ambulating and eating without difficulty.  Discharge instructions given to pt and son.  Both verbalized understanding with all questions answered.  Pt discharged to home with son.  Vista Lawman, RN

## 2012-02-09 NOTE — Discharge Summary (Signed)
[facility] Discharge Summary   Patient ID:  Kristen Bradley MRN: [id number] DOB/AGE: 1945-08-18 67 y.o.  Admit date: 02/08/2012 Discharge date: 02/09/2012 Date of Surgery: 02/08/2012 Surgeon: Surgeon(s): Conrad Hildebran, MD  Admission Diagnosis: LEFT ICA STENOSIS  Discharge Diagnoses:  LEFT ICA STENOSIS  Secondary Diagnoses: Past Medical History  Diagnosis Date  . High cholesterol     takes Lopid daily  . Carotid artery disease   . Tobacco abuse   . Breast cancer     s/p lumpectomy, XRT  . Renal artery stenosis     Right renal artery stent 05/31/06  . Hepatitis C   . Coronary artery disease     1 stent   . Hypertension     takes Losartan,Amlodipine,HCTZ,and Atenolol daily and Clatarpres  . Joint pain     legs  . History of UTI     takes Diflucan dail--pt states no uti in a couple of yrs though  . Diabetes mellitus     takes Metformin daily    Procedure(s): ENDARTERECTOMY CAROTID PATCH ANGIOPLASTY  Discharged Condition: good  HPI:  Kristen Bradley is a 67 y.o. female who presents with chief complaint: asx L ICA stenosis > 80%. The patient presents today for L CEA.  Right carotid endarterectomy was done 01-05-2012.   Hospital Course:  Kristen Bradley is a 67 y.o. female is S/P Left Procedure(s): ENDARTERECTOMY CAROTID PATCH ANGIOPLASTY Extubated: POD # 0 Post-op wounds healing well Pt. Ambulating, voiding and taking PO diet without difficulty. Pt pain controlled with PO pain meds. Labs as below Complications:none  Consults:     Significant Diagnostic Studies: CBC Lab Results  Component Value Date   WBC 6.0 02/09/2012   HGB 8.7* 02/09/2012   HCT 26.0* 02/09/2012   MCV 84.7 02/09/2012   PLT 95* 02/09/2012    BMET    Component Value Date/Time   NA 133* 02/09/2012 0515   K 3.5 02/09/2012 0515   CL 100 02/09/2012 0515   CO2 24 02/09/2012 0515   GLUCOSE 145* 02/09/2012 0515   BUN 24* 02/09/2012 0515   CREATININE 1.25* 02/09/2012 0515   CREATININE 0.82 11/05/2011  1208   CALCIUM 9.1 02/09/2012 0515   GFRNONAA 44* 02/09/2012 0515   GFRAA 51* 02/09/2012 0515   COAG Lab Results  Component Value Date   INR 0.99 02/08/2012   INR 0.80 12/27/2011   INR 0.90 01/16/2010     Disposition:  Discharge to :Home Discharge Orders    Future Appointments: Provider: Department: Dept Phone: Center:   02/25/2012 1:15 PM Conrad , MD [facility] [phone] [facility]     Future Orders Please Complete By Expires   Resume previous diet      Driving Restrictions      Comments:   No driving for at least 48 hours and you are off pain medication   Lifting restrictions      Comments:   No lifting for 4 weeks   Call MD for:  temperature >100.5      Call MD for:  redness, tenderness, or signs of infection (pain, swelling, bleeding, redness, odor or green/yellow discharge around incision site)      Call MD for:  severe or increased pain, loss or decreased feeling  in affected limb(s)      Increase activity slowly      Comments:   Walk with assistance use walker or cane as needed   May shower  Scheduling Instructions:   Thursday   No dressing needed      may wash over wound with mild soap and water      CAROTID Sugery: Call MD for difficulty swallowing or speaking; weakness in arms or legs that is a new symtom; severe headache.  If you have increased swelling in the neck and/or  are having difficulty breathing, CALL 911      Resume previous diet      Driving Restrictions      Comments:   No driving for 2 weeks   Lifting restrictions      Comments:   No lifting for 6 weeks   Call MD for:  temperature >100.5      Call MD for:  redness, tenderness, or signs of infection (pain, swelling, bleeding, redness, odor or green/yellow discharge around incision site)      Call MD for:  severe or increased pain, loss or decreased feeling  in affected limb(s)         Kristen Bradley, Kristen Bradley  Home Medication Instructions [id number]   Printed on:02/09/12  0743  Medication Information                    cloNIDine (CATAPRES) 0.1 MG tablet Take 0.1 mg by mouth 2 (two) times daily.             amLODipine (NORVASC) 10 MG tablet Take 10 mg by mouth at bedtime.             atenolol (TENORMIN) 100 MG tablet Take 100 mg by mouth every morning.             aspirin EC 81 MG tablet Take 81 mg by mouth daily.             metFORMIN (GLUCOPHAGE) 500 MG tablet Take 500 mg by mouth 2 (two) times daily with a meal.             gemfibrozil (LOPID) 600 MG tablet Take 600 mg by mouth 2 (two) times daily.             losartan (COZAAR) 100 MG tablet Take 100 mg by mouth daily.            clopidogrel (PLAVIX) 75 MG tablet Take 1 tablet (75 mg total) by mouth daily.           simvastatin (ZOCOR) 40 MG tablet Take 40 mg by mouth daily.            PRODIGY TWIST TOP LANCETS 28G MISC            hydrochlorothiazide (HYDRODIURIL) 25 MG tablet Take 25 mg by mouth daily.           traMADol (ULTRAM) 50 MG tablet Take 1 tablet (50 mg total) by mouth every 6 (six) hours as needed. For pain           traMADol (ULTRAM) 50 MG tablet Take 1 tablet (50 mg total) by mouth every 6 (six) hours as needed.            Verbal and written Discharge instructions given to the patient. Wound care per Discharge AVS Follow-up Information    Follow up with Hinda Lenis, MD. In 2 weeks.   Contact information:   [place] [address] [phone]          Signed: Laurence Slate [facility] 02/09/2012, 7:43 AM  Addendum  I have independently interviewed and examined the patient, and I  agree with the physician assistant's discharge summary.  This patient previously underwent a R CEA which was complication free.  On follow-up she had a flat left angle of the mouth with full facial muscle strength on testing.  The etiology for that finding was unclear but unrelated to the contralateral procedure.  She also had a high grade stenosis in the left internal  carotid artery.  She underwent carotid endarterectomy on that side this admission.  Intraoperatively, this patient was found to have friable common carotid artery plaque and required an extended endarterectomy into the common carotid artery.  This intraoperative findings raises the possibility of a small prior CVA.  She also began running a high fever intraoperatively, without any evidence otherwise of malignant hyperthermia from an anesthetic agent.  Based on clinical concerns of possible urinary tract infection, she was started on Ciprofloxacin.  She has not had further recurrent of a fever since starting antibiotics.  At this point, she has full neurologic function with intact symmetric motor including the left face.  She has a symmetric smile at this point.  She will continue with the Cipro 500 mg 1 PO BID x 2 days.  She will follow up in the office in 2 weeks.  Adele Barthel, MD [facility] [place] Office: [phone] Pager: [phone]  02/09/2012, 8:50 AM

## 2012-02-09 NOTE — Progress Notes (Addendum)
VASCULAR AND VEIN SURGERY POST - OP CEA PROGRESS NOTE  Date of Surgery: 02/08/2012  Surgeon(s): Conrad Graceton, MD 1 Day Post-Op left Carotid Endarterectomy .  HPI: Kristen Bradley is a 67 y.o. female who is 1 Day Post-Op left Carotid Endarterectomy . Patient is doing well. Pre-operative symptoms are Improved Patient denies headache; Patient denies difficulty swallowing; denies weakness in upper or lower extremities; Pt. denies other symptoms of stroke or TIA. Known facial droop on the left prior to surgery. IMAGING: No results found.  Significant Diagnostic Studies: CBC Lab Results  Component Value Date   WBC 6.0 02/09/2012   HGB 8.7* 02/09/2012   HCT 26.0* 02/09/2012   MCV 84.7 02/09/2012   PLT 95* 02/09/2012    BMET    Component Value Date/Time   NA 133* 02/09/2012 0515   K 3.5 02/09/2012 0515   CL 100 02/09/2012 0515   CO2 24 02/09/2012 0515   GLUCOSE 145* 02/09/2012 0515   BUN 24* 02/09/2012 0515   CREATININE 1.25* 02/09/2012 0515   CREATININE 0.82 11/05/2011 1208   CALCIUM 9.1 02/09/2012 0515   GFRNONAA 44* 02/09/2012 0515   GFRAA 51* 02/09/2012 0515    COAG Lab Results  Component Value Date   INR 0.99 02/08/2012   INR 0.80 12/27/2011   INR 0.90 01/16/2010   No results found for this basename: PTT      Intake/Output Summary (Last 24 hours) at 02/09/12 0741 Last data filed at 02/09/12 0500  Gross per 24 hour  Intake 3042.5 ml  Output    330 ml  Net 2712.5 ml    Physical Exam:  BP Readings from Last 3 Encounters:  02/09/12 131/48  02/09/12 131/48  02/03/12 142/80   Temp Readings from Last 3 Encounters:  02/09/12 98.5 F (36.9 C) Oral  02/09/12 98.5 F (36.9 C) Oral  02/03/12 98.9 F (37.2 C)    SpO2 Readings from Last 3 Encounters:  02/09/12 98%  02/09/12 98%  02/03/12 98%   Pulse Readings from Last 3 Encounters:  02/09/12 56  02/09/12 56  02/03/12 65    Pt is A&O x 3 Gait is normal Speech is fluent left Neck Wound is healing well Patient with Negative tongue  deviation and Positive, Negative facial droop.  Active smile is normal without droop. Pt has good and equal strength in all extremities  Assessment/Plan:: Kristen Bradley is a 67 y.o. female is S/P Left Carotid endarterectomy Pt is voiding, ambulating and taking po well    Discharge to: Home Follow-up in 2 weeks   Laurence Slate [facility] [id number] 02/09/2012 7:41 AM  Addendum  I have independently interviewed and examined the patient, and I agree with the physician assistant's findings.  CN 2-12 intact.  L angle of mouth flat as preop.  Intraoperative findings of friable common carotid artery raises the possibility of small CVA as etiology.  This AM, smile is symmetric.  Motor is sym and 5/5.  BP stable overnight and temperature acceptable.  Would continue Cipro 500 mg 1 PO BID x 2 to finish abx course for presumed UTI.  Follow up in office in 2 weeks.  Adele Barthel, MD [facility] [place] Office: [phone] Pager: [phone]  02/09/2012, 8:46 AM

## 2012-02-24 ENCOUNTER — Encounter: Payer: Self-pay | Admitting: Vascular Surgery

## 2012-02-25 ENCOUNTER — Encounter: Payer: Self-pay | Admitting: Vascular Surgery

## 2012-02-25 ENCOUNTER — Ambulatory Visit (INDEPENDENT_AMBULATORY_CARE_PROVIDER_SITE_OTHER): Payer: Medicare HMO | Admitting: Vascular Surgery

## 2012-02-25 VITALS — BP 131/65 | HR 60 | Ht 64.9 in | Wt 142.2 lb

## 2012-02-25 DIAGNOSIS — I6529 Occlusion and stenosis of unspecified carotid artery: Secondary | ICD-10-CM

## 2012-02-25 DIAGNOSIS — Z48812 Encounter for surgical aftercare following surgery on the circulatory system: Secondary | ICD-10-CM

## 2012-02-25 NOTE — Progress Notes (Signed)
[facility] [place]  Postoperative Visit  History of Present Illness  Kristen Bradley is a 67 y.o. female who presents for postoperative follow-up for: L CEA (Date: 02/09/12).  The patient's neck incision is healed.  The patient has had no stroke or TIA symptoms.  Physical Examination  Filed Vitals:   02/25/12 1330  BP: 131/65  Pulse:     L Neck: Incision is healed, demabond still present, no hematoma Neuro: CN 2-12 are intact , Motor strength is 5/5 bilaterally, sensation is grossly intact, pre-existing L angle of mouth droop improved  Medical Decision Making  Kristen Bradley is a 67 y.o. female who presents s/p L CEA.  The patient's neck incision is healing with no stroke symptoms. I discussed in depth with the patient the nature of atherosclerosis, and emphasized the importance of maximal medical management including strict control of blood pressure, blood glucose, and lipid levels, obtaining regular exercise, and cessation of smoking.  The patient is aware that without maximal medical management the underlying atherosclerotic disease process will progress, limiting the benefit of any interventions. The patient's surveillance will included routine carotid duplex studies which will be completed in: 3 months, at which time the patient will be re-evaluated.   I emphasized the importance of routine surveillance of the carotid arteries as recurrence of stenosis is possible, especially with proper management of underlying atherosclerotic disease. The patient agrees to participate in their maximal medical care and routine surveillance.  Thank you for allowing [place] to participate in this patient's care.  Adele Barthel, MD [facility] [place] Office: [phone] Pager: [phone]

## 2012-05-11 ENCOUNTER — Other Ambulatory Visit: Payer: Self-pay | Admitting: Family Medicine

## 2012-05-15 ENCOUNTER — Other Ambulatory Visit: Payer: Self-pay | Admitting: Family Medicine

## 2012-05-15 DIAGNOSIS — Z853 Personal history of malignant neoplasm of breast: Secondary | ICD-10-CM

## 2012-05-15 DIAGNOSIS — Z780 Asymptomatic menopausal state: Secondary | ICD-10-CM

## 2012-05-25 ENCOUNTER — Encounter: Payer: Self-pay | Admitting: Vascular Surgery

## 2012-05-26 ENCOUNTER — Other Ambulatory Visit: Payer: Self-pay | Admitting: *Deleted

## 2012-05-26 ENCOUNTER — Ambulatory Visit (INDEPENDENT_AMBULATORY_CARE_PROVIDER_SITE_OTHER): Payer: Medicare HMO | Admitting: Vascular Surgery

## 2012-05-26 ENCOUNTER — Other Ambulatory Visit (INDEPENDENT_AMBULATORY_CARE_PROVIDER_SITE_OTHER): Payer: Medicare HMO | Admitting: *Deleted

## 2012-05-26 ENCOUNTER — Encounter: Payer: Self-pay | Admitting: Vascular Surgery

## 2012-05-26 DIAGNOSIS — I6529 Occlusion and stenosis of unspecified carotid artery: Secondary | ICD-10-CM

## 2012-05-26 DIAGNOSIS — Z48812 Encounter for surgical aftercare following surgery on the circulatory system: Secondary | ICD-10-CM

## 2012-05-26 HISTORY — DX: Encounter for surgical aftercare following surgery on the circulatory system: Z48.812

## 2012-05-26 NOTE — Progress Notes (Signed)
[facility] [place]  Established Carotid Patient  History of Present Illness  Kristen Bradley is a 67 y.o. ([birth year]) female who presents with chief complaint: lost of taste.  The patient under went R CEA 01/05/12 and L CEA 02/09/12.  The patient has had no TIA and stroke Sx since those surgeries.  She denies HA.  Her previous left corner mouth droops has resolved.  Past Medical History, Past Surgical History, Social History, Family History, Medications, Allergies, and Review of Systems are unchanged from previous evaluation on 02/09/12.  Physical Examination  Filed Vitals:   05/26/12 0946 05/26/12 0948  BP: 131/56 121/53  Pulse: 55 57  Temp: 98 F (36.7 C)   Resp: 16   Height: 5\' 5"  (1.651 m)   Weight: 140 lb (63.504 kg)   SpO2: 100%    Body mass index is 23.3 kg/(m^2).  General: A&O x 3, WDWN  Eyes: PERRLA, EOMI  Pulmonary: Sym exp, good air movt, CTAB, no rales, rhonchi, & wheezing  Cardiac: RRR, Nl S1, S2, no Murmurs, rubs or gallops  Vascular:  B carotid palpable, B radial and brachial palpable  Gastrointestinal: soft, NTND, -G/R, - HSM, - masses, - CVAT B  Musculoskeletal: M/S 5/5 throughout , Extremities without ischemic changes   Neurologic: CN 2-12 intact including B smile sym, tongue is midline, Pain and light touch intact in extremities , Motor exam as listed above  Non-Invasive Vascular Imaging  CAROTID DUPLEX (Date: 05/26/12):   R ICA stenosis: patent CEA  R VA: patent and antegrade  L ICA stenosis: patent CEA  L VA: patent and antegrade  Medical Decision Making  Kristen Bradley is a 67 y.o. female who presents with: s/p B CEA   Based on the patient's vascular studies and examination, I have offered the patient: annual carotid surveillance.  I discussed in depth with the patient the nature of atherosclerosis, and emphasized the importance of maximal medical management including strict control of blood pressure, blood glucose, and  lipid levels, antiplatelet agents, obtaining regular exercise, and cessation of smoking.  The patient is aware that without maximal medical management the underlying atherosclerotic disease process will progress, limiting the benefit of any interventions.  Thank you for allowing [place] to participate in this patient's care.  Adele Barthel, MD [facility] [place] Office: [phone] Pager: [phone]  05/26/2012, 10:56 AM

## 2012-05-26 NOTE — Addendum Note (Signed)
Addended by: Dorthula Rue L on: 05/26/2012 11:46 AM   Modules accepted: Orders

## 2012-07-20 ENCOUNTER — Other Ambulatory Visit: Payer: Self-pay | Admitting: Family Medicine

## 2012-07-27 ENCOUNTER — Ambulatory Visit
Admission: RE | Admit: 2012-07-27 | Discharge: 2012-07-27 | Disposition: A | Discharge status: Home / Self Care | Payer: Medicare HMO | Source: Ambulatory Visit | Attending: Family Medicine | Admitting: Family Medicine

## 2012-08-01 ENCOUNTER — Encounter: Payer: Self-pay | Admitting: Vascular Surgery

## 2012-08-01 ENCOUNTER — Other Ambulatory Visit: Payer: Self-pay

## 2012-08-18 ENCOUNTER — Telehealth: Payer: Self-pay | Admitting: *Deleted

## 2012-08-18 ENCOUNTER — Telehealth: Payer: Self-pay

## 2012-08-18 NOTE — Telephone Encounter (Signed)
This patient is also being referred to Dr. [name] for abnormal ABIs ( Right 0.44 and Left 0.45) and severe aorto-iliac occlusive disease bilaterally from Dr. Antony Blackbird. In light of this information, Dr. [name] would not suggest stopping this patient's Plavix. Melissa at [facility] said that this colonoscopy is for slight anemia ( Hgb is 11.6 now) and that this can be delayed until Dr. Lianne Moris evaluation of the patient's PAD on 09-08-12. She will contact the patient and make sure she knows to keep our appt on 09-08-12 at 2:15pm.

## 2012-08-18 NOTE — Telephone Encounter (Signed)
Message copied by Denman George on Fri Aug 18, 2012  9:08 AM ------      Message from: Conrad Mono      Created: Thu Aug 17, 2012  4:20 PM      Regarding: RE: [place]            ----- Message -----         From: Sherrye Payor, RN         Sent: 08/17/2012   4:00 PM           To: Conrad Glenwood, MD      Subject: APPROVAL TO HOLD PLAVIX                                  Rec'd call from [facility] asking if okay to hold Plavix 5 days prior to C-Scope on 08/31/12.  Hx of (R) CEA 01/05/12 and (L) CEA 02/09/12. Last seen 05/26/12 and placed on annual surveillance.  Please advise.        ------

## 2012-08-18 NOTE — Telephone Encounter (Signed)
Per recommendation Dr. [name]- okay to hold pt's Plavix 5 days prior to procedure.

## 2012-09-07 ENCOUNTER — Encounter: Payer: Self-pay | Admitting: Vascular Surgery

## 2012-09-08 ENCOUNTER — Encounter: Payer: Self-pay | Admitting: Vascular Surgery

## 2012-09-08 ENCOUNTER — Ambulatory Visit (INDEPENDENT_AMBULATORY_CARE_PROVIDER_SITE_OTHER): Payer: Medicare HMO | Admitting: Vascular Surgery

## 2012-09-08 VITALS — BP 133/51 | HR 71 | Ht 65.0 in | Wt 141.4 lb

## 2012-09-08 DIAGNOSIS — M79609 Pain in unspecified limb: Secondary | ICD-10-CM

## 2012-09-08 DIAGNOSIS — I739 Peripheral vascular disease, unspecified: Secondary | ICD-10-CM

## 2012-09-08 DIAGNOSIS — I6529 Occlusion and stenosis of unspecified carotid artery: Secondary | ICD-10-CM

## 2012-09-08 HISTORY — DX: Pain in unspecified limb: M79.609

## 2012-09-08 MED ORDER — CILOSTAZOL 100 MG PO TABS: 100.0000 mg | ORAL_TABLET | Freq: Two times a day (BID) | ORAL | Status: DC

## 2012-09-08 NOTE — Progress Notes (Signed)
[facility] [place]  Established Intermittent Claudication  History of Present Illness  Kristen Bradley is a 67 y.o. (03/11/1945) female who presents with chief complaint: B leg pain.  The patient's symptoms have not progressed.  The patient's symptoms are: short distance intermittent claudication in both legs.  The patient's treatment regimen currently included: maximal medical management.  This was her initial presentation to this practice.  Unfortunately her B ICA stenosis progressed and she was found to have >80% stenosis in both arteries, resulting in B CEA to date.  At this point, she returns for re-evaluation of her legs.  She does not feel her sx interfere with her ADLs.  The patient's PMH, PSH, SH, FamHx, Med, and Allergies are unchanged from 05/26/12. On ROS today: no rest pain, no gangrene or ulcers  Physical Examination  Filed Vitals:   09/08/12 1447  BP: 133/51  Pulse: 71  Height: 5\' 5"  (1.651 m)  Weight: 141 lb 6.4 oz (64.139 kg)  SpO2: 100%   Body mass index is 23.53 kg/(m^2).  General: A&O x 3, WD  Pulmonary: Sym exp, good air movt, CTAB, no rales, rhonchi, & wheezing  Cardiac: RRR, Nl S1, S2, no Murmurs, rubs or gallops  Vascular: Vessel Right Left  Radial Palpable Palpable  Brachial Palpable Palpable  Carotid Palpable, without bruit Palpable, without bruit  Aorta Not palpable N/A  Femoral Palpable Palpable  Popliteal Not palpable Not palpable  PT Not Palpable Not Palpable  DP Faintly Palpable Not Palpable   Gastrointestinal: soft, NTND, -G/R, - HSM, - masses, - CVAT B  Musculoskeletal: M/S 5/5 throughout , Extremities without ischemic changes   Neurologic: Pain and light touch intact in extremities , Motor exam as listed above  Non-Invasive Vascular Imaging Outside BLE physiologic studies (Date: 07/27/12)  R 0.44, monophasic PT and DP, biphasic fem, mono pop  L 0.74, monophasic PT and DP, biphasic fem and pop  Medical  Decision Making  Kristen Bradley is a 67 y.o. female who presents with:  both leg intermittent claudication without evidence of critical limb ischemia.  Based on the patient's vascular studies and examination, I have offered the patient: q3 month ABI.  She already has annual B carotid duplex scheduled to check for re-stenosis of both CEA sites.  I discussed in depth with the patient the nature of atherosclerosis, and emphasized the importance of maximal medical management including strict control of blood pressure, blood glucose, and lipid levels, antiplatelet agents, obtaining regular exercise, and cessation of smoking.    The patient is aware that without maximal medical management the underlying atherosclerotic disease process will progress, limiting the benefit of any interventions. The patient is currently on a statin: Zocor.  The patient is currently on an anti-platelet: ASA.  Adele Barthel, MD [facility] [place] Office: [phone] Pager: [phone]  09/08/2012, 4:43 PM

## 2012-09-19 ENCOUNTER — Ambulatory Visit
Admission: RE | Admit: 2012-09-19 | Discharge: 2012-09-19 | Disposition: A | Discharge status: Home / Self Care | Payer: Medicare HMO | Source: Ambulatory Visit | Attending: Family Medicine | Admitting: Family Medicine

## 2012-09-19 ENCOUNTER — Other Ambulatory Visit: Payer: Self-pay | Admitting: *Deleted

## 2012-09-19 DIAGNOSIS — Z780 Asymptomatic menopausal state: Secondary | ICD-10-CM

## 2012-09-19 DIAGNOSIS — Z853 Personal history of malignant neoplasm of breast: Secondary | ICD-10-CM

## 2012-09-19 DIAGNOSIS — I70219 Atherosclerosis of native arteries of extremities with intermittent claudication, unspecified extremity: Secondary | ICD-10-CM

## 2012-12-14 ENCOUNTER — Encounter: Payer: Self-pay | Admitting: Vascular Surgery

## 2012-12-15 ENCOUNTER — Ambulatory Visit: Payer: Medicare HMO | Admitting: Vascular Surgery

## 2012-12-15 ENCOUNTER — Encounter (HOSPITAL_COMMUNITY): Payer: Medicare HMO

## 2013-01-24 ENCOUNTER — Encounter: Payer: Self-pay | Admitting: Vascular Surgery

## 2013-01-26 ENCOUNTER — Ambulatory Visit (HOSPITAL_COMMUNITY)
Admission: RE | Admit: 2013-01-26 | Discharge: 2013-01-26 | Disposition: A | Discharge status: Home / Self Care | Payer: Medicare HMO | Source: Ambulatory Visit | Attending: Vascular Surgery | Admitting: Vascular Surgery

## 2013-01-26 ENCOUNTER — Encounter (INDEPENDENT_AMBULATORY_CARE_PROVIDER_SITE_OTHER): Payer: Self-pay

## 2013-01-26 ENCOUNTER — Ambulatory Visit (INDEPENDENT_AMBULATORY_CARE_PROVIDER_SITE_OTHER): Payer: Medicare HMO | Admitting: Vascular Surgery

## 2013-01-26 ENCOUNTER — Encounter: Payer: Self-pay | Admitting: Vascular Surgery

## 2013-01-26 DIAGNOSIS — M79609 Pain in unspecified limb: Secondary | ICD-10-CM | POA: Insufficient documentation

## 2013-01-26 DIAGNOSIS — I70219 Atherosclerosis of native arteries of extremities with intermittent claudication, unspecified extremity: Secondary | ICD-10-CM

## 2013-01-26 DIAGNOSIS — T82898A Other specified complication of vascular prosthetic devices, implants and grafts, initial encounter: Secondary | ICD-10-CM | POA: Insufficient documentation

## 2013-01-26 DIAGNOSIS — I6529 Occlusion and stenosis of unspecified carotid artery: Secondary | ICD-10-CM

## 2013-01-26 HISTORY — DX: Atherosclerosis of native arteries of extremities with intermittent claudication, unspecified extremity: I70.219

## 2013-01-26 NOTE — Progress Notes (Addendum)
Established Carotid and Intermittent Claudication Patient  History of Present Illness  Kristen Bradley is a 67 y.o. (Mar 07, 1945) female who presents with chief complaint: left lower leg numbness.  Previous carotid studies demonstrated: RICA [id number] stenosis, LICA A999333 stenosis.  Patient has no history of TIA or stroke symptom.  The patient has never had amaurosis fugax or monocular blindness.  The patient has never had facial drooping or hemiplegia.  The patient has mever had receptive or expressive aphasia.  The patient previously had some L corner of mouth droop after her R CEA, which has since resolved.  The patient continues to have some non-lifestyle limit intermittent claudication.  She is not doing a walking plan.  She notes some intermittent Left lower leg numbness without other radicular sx. Pt continues to smoke  The patient's PMH, PSH, SH, FamHx, Med, and Allergies are unchanged from 05/26/12.  On ROS today: non-lifestyle limiting intermittent claudication , no rest pain  Physical Examination  There were no vitals filed for this visit. There is no weight on file to calculate BMI.  General: A&O x 3, WD, thin  Eyes: PERRLA, EOMI  Neck: Supple, no nuchal rigidity, no palpable LAD, incision well healed  Pulmonary: Sym exp, good air movt, CTAB, no rales, rhonchi, & wheezing  Cardiac: RRR, Nl S1, S2, no Murmurs, rubs or gallops  Vascular: Vessel Right Left  Radial Palpable Palpable  Brachial Palpable Palpable  Carotid Palpable, without bruit Palpable, without bruit  Aorta Not palpable N/A  Femoral Palpable Palpable  Popliteal Not palpable Not palpable  PT Not Palpable Not Palpable  DP Not Palpable Not Palpable   Gastrointestinal: soft, NTND, -G/R, - HSM, - masses, - CVAT B  Musculoskeletal: M/S 5/5 throughout , Extremities without ischemic changes   Neurologic: CN 2-12 intact , Pain and light touch intact in extremities , Motor exam as listed above  Non-Invasive Vascular  Imaging  CAROTID DUPLEX (Date: 05/26/12):   R ICA stenosis: patent endart. site  R VA: patent and antegrade  L ICA stenosis: patent endart. Site  L VA: patent and antegrade  ABI (Date: 01/26/2013)  R: 0.62 (0.58), DP: mono, PT: mono, TBI: 0.47  L: 0.61 (0.54), DP: mono, PT: mono, TBI: 0.43  Medical Decision Making  Kristen Bradley is a 67 y.o. female who presents with: s/p B CEA, non-lifestyle limiting intermittent claudication    Based on the patient's vascular studies and examination, I have offered the patient: B CEA (in January 2015) and ABI annually.  Her sx pattern for her Left leg and ABI are not consistent with L PAD as the etiology for her sx.  I would consider spinal evaluation with +/- EMG/NCV studies.  The patient needs to stop smoking otherwise he carotid arteries are likely to restenose and her PAD will progress with time.  The pt's prior left corner of mouth drooping is NOT consistent with marginal mandibular drooping as it was CONTRALATERAL to the R CEA, suggesting possible small contralateral CVA.  I discussed in depth with the patient the nature of atherosclerosis, and emphasized the importance of maximal medical management including strict control of blood pressure, blood glucose, and lipid levels, antiplatelet agents, obtaining regular exercise, and cessation of smoking.    The patient is aware that without maximal medical management the underlying atherosclerotic disease process will progress, limiting the benefit of any interventions. The patient is currently on a statin: Zocor.   The patient is currently on an anti-platelet: ASA.  Thank  you for allowing [place] to participate in this patient's care.  Adele Barthel, MD [facility] [place] Office: [phone] Pager: [phone]  01/26/2013, 9:29 AM    VASCULAR QUALITY INITIATIVE FOLLOW UP DATA:  Current smoker: [ x ] yes  [  ] no  Living status: [ x ]  Home  [  ] Nursing home  [   ] Homeless    MEDS:  ASA [ x ] yes  [  ] no- [  ] medical reason  [  ] non compliant  STATIN  [  x] yes  [  ] no- [  ] medical reason  [  ] non compliant  Beta blocker [  ] yes  [ x ] no- [  ] medical reason  [  ] non compliant  ACE inhibitor [  ] yes  [ x ] no- [  ] medical reason  [  ] non compliant  P2Y12 Antagonist [ x ] none  [  ] clopidogrel-Plavix  [  ] ticlopidine-Ticlid   [  ] prasugrel-Effient  [  ] ticagrelor- Brilinta    Anticoagulant [ x ] None  [  ] warfarin  [  ] rivaroxaban-Xarelto [  ] dabigatran- Pradaxa  Neurologic event since D/C:  [ x ] no  [  ] yes: [  ] eye event  [  ] cortical event  [  ] VB event  [  ] non specific event  [  ] right  [  ] left  [  ] TIA  [  ] stroke  Date:   Modified Rankin Score: 0  MI since D/C: [ x ] no  [  ] troponin only  [  ] EKG or clinical  Cranial nerve injury: [ x ] none  [  ] resolved  [  ] persistent  Duplex CEA site: [  ] no  [ x ] yes - R: 77/7 c/s, ICA/CCA: 1.57, L: 72/8 c/s, ICA/CCA: 0.56  Stenosis= [ x ] <40% bilaterally [  ] 40-59% [  ] 60-79%  [  ] > 80%  [  ]  Occluded  CEA site re-operation:  [ x ] no   [  ] yes- date of re-op:  CEA site PCI:   [ x ] no   [  ] yes- date of PCI:

## 2013-01-26 NOTE — Addendum Note (Signed)
Addended by: Conrad Roosevelt on: 01/26/2013 10:08 AM   Modules accepted: Level of Service

## 2013-01-26 NOTE — Progress Notes (Deleted)
    Postoperative Access Visit   History of Present Illness  Kristen Bradley is a 67 y.o. year old female who presents for postoperative follow-up for: Ligation of L BC AVF (Date: 01/09/13).  The patient's upper arm incision is healed healed.  The patient notes no steal symptoms.  The patient is able to complete their activities of daily living.  The patient's current symptoms are: none.  For VQI Use Only  PRE-ADM LIVING: Home  AMB STATUS: Ambulatory  Physical Examination There were no vitals filed for this visit.  LUE: BC exposure iIncision is healed, skin feels warmer, hand grip is 4/5, sensation in digits is intact, nopalpable thrill, bruit cannot be auscultated , scabs still present in L hand and L4th finger amputation incision, hand appears better perfused than previously  Medical Decision Making  Kristen Bradley is a 67 y.o. year old female who presents s/p L BC AVF ligation.  I suspect the ligation was enough to get the left hand to heal.  Follow up in 4 weeks for evaluation of R arm for access purposes: vein mapping, arterial duplex.  Thank you for allowing [place] to participate in this patient's care.  Adele Barthel, MD [facility] [place] Office: [phone] Pager: [phone]  01/26/2013, 8:38 AM

## 2013-01-26 NOTE — Addendum Note (Signed)
Addended by: Mena Goes on: 01/26/2013 11:39 AM   Modules accepted: Orders

## 2013-03-02 ENCOUNTER — Other Ambulatory Visit: Payer: Self-pay | Admitting: Vascular Surgery

## 2013-03-02 DIAGNOSIS — Z48812 Encounter for surgical aftercare following surgery on the circulatory system: Secondary | ICD-10-CM

## 2013-03-02 DIAGNOSIS — I6529 Occlusion and stenosis of unspecified carotid artery: Secondary | ICD-10-CM

## 2013-05-31 ENCOUNTER — Encounter: Payer: Self-pay | Admitting: Family

## 2013-06-01 ENCOUNTER — Other Ambulatory Visit (HOSPITAL_COMMUNITY): Payer: Medicare HMO

## 2013-06-01 ENCOUNTER — Encounter (HOSPITAL_COMMUNITY): Payer: Medicare HMO

## 2013-06-01 ENCOUNTER — Ambulatory Visit: Payer: Medicare HMO | Admitting: Family

## 2013-06-01 ENCOUNTER — Ambulatory Visit: Payer: Medicare HMO | Admitting: Vascular Surgery

## 2013-06-01 ENCOUNTER — Encounter: Payer: Self-pay | Admitting: Family

## 2013-06-01 ENCOUNTER — Ambulatory Visit (HOSPITAL_COMMUNITY)
Admission: RE | Admit: 2013-06-01 | Discharge: 2013-06-01 | Disposition: A | Discharge status: Home / Self Care | Payer: Medicare HMO | Source: Ambulatory Visit | Attending: Family | Admitting: Family

## 2013-06-01 ENCOUNTER — Ambulatory Visit (INDEPENDENT_AMBULATORY_CARE_PROVIDER_SITE_OTHER): Payer: Commercial Managed Care - HMO | Admitting: Family

## 2013-06-01 ENCOUNTER — Ambulatory Visit (INDEPENDENT_AMBULATORY_CARE_PROVIDER_SITE_OTHER)
Admission: RE | Admit: 2013-06-01 | Discharge: 2013-06-01 | Disposition: A | Discharge status: Home / Self Care | Payer: Medicare HMO | Source: Ambulatory Visit | Attending: Vascular Surgery | Admitting: Vascular Surgery

## 2013-06-01 VITALS — BP 131/65 | HR 57 | Resp 14 | Ht 66.0 in | Wt 129.0 lb

## 2013-06-01 DIAGNOSIS — I6523 Occlusion and stenosis of bilateral carotid arteries: Secondary | ICD-10-CM

## 2013-06-01 DIAGNOSIS — I6529 Occlusion and stenosis of unspecified carotid artery: Secondary | ICD-10-CM | POA: Insufficient documentation

## 2013-06-01 DIAGNOSIS — Z48812 Encounter for surgical aftercare following surgery on the circulatory system: Secondary | ICD-10-CM

## 2013-06-01 DIAGNOSIS — I739 Peripheral vascular disease, unspecified: Secondary | ICD-10-CM

## 2013-06-01 DIAGNOSIS — I658 Occlusion and stenosis of other precerebral arteries: Secondary | ICD-10-CM

## 2013-06-01 DIAGNOSIS — I70219 Atherosclerosis of native arteries of extremities with intermittent claudication, unspecified extremity: Secondary | ICD-10-CM

## 2013-06-01 NOTE — Progress Notes (Signed)
[facility] [place] HISTORY AND PHYSICAL   MRN : [id number]  History of Present Illness:   Kristen Bradley is a 68 y.o. female patient of Dr. [name] who is s/p right CEA on 01/05/2012, and left CEA using patch angioplasty Vascu-Guard bovine patch 1cm x 6cm on 02/08/2012. The patient has never had amaurosis fugax or monocular blindness. The patient has never had facial drooping or hemiplegia. The patient has mever had receptive or expressive aphasia. The patient previously had some L corner of mouth droop after her R CEA, which has since resolved. The patient continues to have some non-lifestyle limiting intermittent claudication. She is not doing a walking plan. She notes some intermittent Left lower leg numbness without other radicular sx. Pt continues to smoke. At her visit with Dr. [name] on 01/26/2013 his assessment was the following: Her sx pattern for her Left leg and ABI are not consistent with L PAD as the etiology for her sx. I would consider spinal evaluation with +/- EMG/NCV studies. The patient needs to stop smoking otherwise he carotid arteries are likely to restenose and her PAD will progress with time. The pt's prior left corner of mouth drooping is NOT consistent with marginal mandibular drooping as it was CONTRALATERAL to the R CEA, suggesting possible small contralateral CVA.  Pt denies any new stroke or TIA symptoms in the last 5 months. She complains of bilateral calf pain almost immediately with walking at times, but sometimes she has no claudication symptoms with walking. Pt denies non healing wounds.  Pt Diabetic: Yes, states her last A1C was under 7.0. Pt smoker: smoker  (1/2 ppd, started smoking at about age 81), states she stopped several times  Pt meds include: Statin :Yes Betablocker: Yes ASA: Yes Other anticoagulants/antiplatelets: Plavix, Pletal  Current Outpatient Prescriptions  Medication Sig Dispense Refill  . amLODipine (NORVASC) 10 MG tablet  Take 10 mg by mouth at bedtime.        Marland Kitchen aspirin EC 81 MG tablet Take 81 mg by mouth daily.        Marland Kitchen atenolol (TENORMIN) 100 MG tablet Take 100 mg by mouth every morning.        . cilostazol (PLETAL) 100 MG tablet Take 1 tablet (100 mg total) by mouth 2 (two) times daily before a meal.  60 tablet  11  . ciprofloxacin (CIPRO) 500 MG tablet       . cloNIDine (CATAPRES) 0.1 MG tablet Take 0.1 mg by mouth 2 (two) times daily.        . clopidogrel (PLAVIX) 75 MG tablet Take 1 tablet (75 mg total) by mouth daily.  30 tablet  11  . gemfibrozil (LOPID) 600 MG tablet Take 600 mg by mouth 2 (two) times daily.        . hydrochlorothiazide (HYDRODIURIL) 12.5 MG tablet       . losartan (COZAAR) 100 MG tablet Take 100 mg by mouth daily.       . metFORMIN (GLUCOPHAGE) 500 MG tablet Take 500 mg by mouth 2 (two) times daily with a meal.        . PRODIGY NO CODING BLOOD GLUC test strip       . PRODIGY TWIST TOP LANCETS 28G MISC       . simvastatin (ZOCOR) 40 MG tablet Take 40 mg by mouth daily.       . traMADol (ULTRAM) 50 MG tablet Take 1 tablet (50 mg total) by mouth every 6 (six) hours as  needed.  30 tablet  0  . hydrochlorothiazide (HYDRODIURIL) 25 MG tablet Take 25 mg by mouth daily.      Marland Kitchen HYDROcodone-acetaminophen (NORCO/VICODIN) 5-325 MG per tablet        No current facility-administered medications for this visit.    Past Medical History  Diagnosis Date  . High cholesterol     takes Lopid daily  . Carotid artery disease   . Tobacco abuse   . Breast cancer     s/p lumpectomy, XRT  . Renal artery stenosis     Right renal artery stent 05/31/06  . Hepatitis C   . Coronary artery disease     1 stent   . Hypertension     takes Losartan,Amlodipine,HCTZ,and Atenolol daily and Clatarpres  . Joint pain     legs  . History of UTI     takes Diflucan dail--pt states no uti in a couple of yrs though  . Diabetes mellitus     takes Metformin daily    Past Surgical History  Procedure Laterality  Date  . Adrenal gland surgery    . Abdominal hysterectomy    . Breast lumpectomy      right  . Back surgery    . Coronary angioplasty  2008    1 stent  . Endarterectomy  01/05/2012    Procedure: ENDARTERECTOMY CAROTID;  Surgeon: Conrad Valley Hi, MD;  Location: [facility];  Service: Vascular;  Laterality: Right;  Right Carotid Artery Endarterectomy, right stump pressure, intraoperative ultrasound  . Endarterectomy  02/08/2012    Procedure: ENDARTERECTOMY CAROTID;  Surgeon: Conrad Forsyth, MD;  Location: [facility];  Service: Vascular;  Laterality: Left;  . Patch angioplasty  02/08/2012    Procedure: PATCH ANGIOPLASTY;  Surgeon: Conrad , MD;  Location: [facility] OR;  Service: Vascular;  Laterality: Left;  Carotid artery patch angioplasty using Vascu-Guard bovine patch 1cm x 6cm.  . Carotid endarterectomy      Social History History  Substance Use Topics  . Smoking status: Light Tobacco Smoker -- 0.00 packs/day for 50 years    Types: Cigarettes    Last Attempt to Quit: 12/29/2011  . Smokeless tobacco: Not on file     Comment: pt states she only smokes 2-3 cigs per day  . Alcohol Use: No    Family History Family History  Problem Relation Age of Onset  . Cancer Mother     ? type  . Cancer Father     ? type  . Diabetes Sister   . Diabetes Brother   . CAD Neg Hx     Allergies  Allergen Reactions  . Chlorhexidine Gluconate Itching  . Ace Inhibitors Cough  . Dilaudid [Hydromorphone Hcl] Itching     REVIEW OF SYSTEMS: See HPI for pertinent positives and negatives.  Physical Examination Filed Vitals:   06/01/13 1038 06/01/13 1041  BP: 132/70 131/65  Pulse: 56 57  Resp:  14  Height:  5\' 6"  (1.676 m)  Weight:  129 lb (58.514 kg)  SpO2:  100%  Body mass index is 20.83 kg/(m^2).  General:  WDWN in NAD Gait: Normal HENT: WNL Eyes: PERRLA Pulmonary: normal non-labored breathing , without Rales, rhonchi, or wheezing, diminished breath sounds in all fields, occasional dry cough Cardiac: RRR,  no murmurs detected  Abdomen: soft, NT, no masses Skin: no rashes, ulcers noted;  no Gangrene , no cellulitis; no open wounds;   VASCULAR EXAM  Carotid Bruits Left Right   Positive Positive  Radial pulses are 1+ palpable and = Aorta is not palpable.                     VASCULAR EXAM: Extremities without ischemic changes  without Gangrene; without open wounds.                                                                                                          LE Pulses LEFT RIGHT       FEMORAL  1+ palpable  2+ palpable        POPLITEAL  not palpable   not palpable       POSTERIOR TIBIAL  not palpable   not palpable        DORSALIS PEDIS      ANTERIOR TIBIAL not palpable  not palpable     Musculoskeletal: no muscle wasting or atrophy; no edema  Neurologic: A&O X 3; Appropriate Affect ;  SENSATION: normal; MOTOR FUNCTION: 5/5 Symmetric, CN 2-12 intact Speech is fluent/normal   Non-Invasive Vascular Imaging (06/01/2013):  Carotid Duplex: No bilateral ICA stenosis, no ECA stenosis, vertebral arteries are antegrade, but both subclavian arteries are multiphasic in waveforms.  ABI's:  Right: 0.63, Left: 0.60, monophasic waveforms Previous (01/26/2013) ABI's: Right: 0.62, Left: 0.61  ASSESSMENT:  Kristen Bradley is a 68 y.o. female who is s/p right CEA on 01/05/2012, and left CEA using patch angioplasty Vascu-Guard bovine patch 1cm x 6cm on 02/08/2012. She also has mild/moderate claudication symptoms in her calves, but no ulcers. She states she has has not had a recent back or neurological evaluation, but has had lumbar spine surgery in the remote past. No restenosis of either ICA after bilateral CEA's. ABI's are stable and indicate bilateral moderate arterial occlusive disease in both legs. Unfortunately she continues to smoke, but fortunately she seems now determined to quit and her DM seems in control.   PLAN:   Start graduated walking program. Stop smoking,  she was counseled re this. Based on today's exam and non-invasive vascular lab results, the patient will follow up in 6 months with the following tests ABI's, and one year for carotid Duplex. I discussed in depth with the patient the nature of atherosclerosis, and emphasized the importance of maximal medical management including strict control of blood pressure, blood glucose, and lipid levels, obtaining regular exercise, and cessation of smoking.  The patient is aware that without maximal medical management the underlying atherosclerotic disease process will progress, limiting the benefit of any interventions. The patient was given information about stroke prevention and what symptoms should prompt the patient to seek immediate medical care. The patient was given information about AAA including signs, symptoms, treatment,  what symptoms should prompt the patient to seek immediate medical care, and how to minimize the risk of enlargement and rupture of aneurysms. The patient was given information about PAD including signs, symptoms, treatment, what symptoms should prompt the patient to seek immediate medical care, and risk reduction measures to take. Thank you for allowing [place] to participate in this patient's care.  Clemon Chambers, RN, MSN, FNP-C [facility] Office: [phone]  Clinic MD: Oneida Alar on call 06/01/2013 10:36 AM  VASCULAR QUALITY INITIATIVE FOLLOW UP DATA:  Current smoker: [ X ] yes  [  ] no  Living status: [ X ]  Home  [  ] Nursing home  [  ] Homeless    MEDS:  ASA Valu.Nieves  ] yes  [  ] no- [  ] medical reason  [  ] non compliant  STATIN  Valu.Nieves  ] yes  [  ] no- [  ] medical reason  [  ] non compliant  Beta blocker Valu.Nieves  ] yes  [  ] no- [  ] medical reason  [  ] non compliant  ACE inhibitor [  ] yes  [ X ] no- Valu.Nieves  ] medical reason  [  ] non compliant  P2Y12 Antagonist [  ] none  Valu.Nieves  ] clopidogrel-Plavix  [  ] ticlopidine-Ticlid   [  ] prasugrel-Effient  [  ]  ticagrelor- Brilinta    Anticoagulant Valu.Nieves  ] None  [  ] warfarin  [  ] rivaroxaban-Xarelto [  ] dabigatran- Pradaxa  Neurologic event since D/C:  Valu.Nieves  ] no  [  ] yes: [  ] eye event  [  ] cortical event  [  ] VB event  [  ] non specific event  [  ] right  [  ] left  [  ] TIA  [  ] stroke  Date:   Modified Rankin Score: 0

## 2013-06-01 NOTE — Addendum Note (Signed)
Addended by: Mena Goes on: 06/01/2013 12:02 PM   Modules accepted: Orders

## 2013-06-01 NOTE — Patient Instructions (Addendum)
Stroke Prevention Some medical conditions and behaviors are associated with an increased chance of having a stroke. You may prevent a stroke by making healthy choices and managing medical conditions. HOW CAN I REDUCE MY RISK OF HAVING A STROKE?   Stay physically active. Get at least 30 minutes of activity on most or all days.  Do not smoke. It may also be helpful to avoid exposure to secondhand smoke.  Limit alcohol use. Moderate alcohol use is considered to be:  No more than 2 drinks per day for men.  No more than 1 drink per day for nonpregnant women.  Eat healthy foods. This involves  Eating 5 or more servings of fruits and vegetables a day.  Following a diet that addresses high blood pressure (hypertension), high cholesterol, diabetes, or obesity.  Manage your cholesterol levels.  A diet low in saturated fat, trans fat, and cholesterol and high in fiber may control cholesterol levels.  Take any prescribed medicines to control cholesterol as directed by your health care provider.  Manage your diabetes.  A controlled-carbohydrate, controlled-sugar diet is recommended to manage diabetes.  Take any prescribed medicines to control diabetes as directed by your health care provider.  Control your hypertension.  A low-salt (sodium), low-saturated fat, low-trans fat, and low-cholesterol diet is recommended to manage hypertension.  Take any prescribed medicines to control hypertension as directed by your health care provider.  Maintain a healthy weight.  A reduced-calorie, low-sodium, low-saturated fat, low-trans fat, low-cholesterol diet is recommended to manage weight.  Stop drug abuse.  Avoid taking birth control pills.  Talk to your health care provider about the risks of taking birth control pills if you are over 35 years old, smoke, get migraines, or have ever had a blood clot.  Get evaluated for sleep disorders (sleep apnea).  Talk to your health care provider about  getting a sleep evaluation if you snore a lot or have excessive sleepiness.  Take medicines as directed by your health care provider.  For some people, aspirin or blood thinners (anticoagulants) are helpful in reducing the risk of forming abnormal blood clots that can lead to stroke. If you have the irregular heart rhythm of atrial fibrillation, you should be on a blood thinner unless there is a good reason you cannot take them.  Understand all your medicine instructions.  Make sure that other other conditions (such as anemia or atherosclerosis) are addressed. SEEK IMMEDIATE MEDICAL CARE IF:   You have sudden weakness or numbness of the face, arm, or leg, especially on one side of the body.  Your face or eyelid droops to one side.  You have sudden confusion.  You have trouble speaking (aphasia) or understanding.  You have sudden trouble seeing in one or both eyes.  You have sudden trouble walking.  You have dizziness.  You have a loss of balance or coordination.  You have a sudden, severe headache with no known cause.  You have new chest pain or an irregular heartbeat. Any of these symptoms may represent a serious problem that is an emergency. Do not wait to see if the symptoms will go away. Get medical help at once. Call your local emergency services  (911 in [place] Do not drive yourself to the hospital. Document Released: 02/26/2004 Document Revised: 11/08/2012 Document Reviewed: 07/21/2012 [employer] Patient Information 2014 [employer]   Smoking Cessation Quitting smoking is important to your health and has many advantages. However, it is not always easy to quit since nicotine is a   very addictive drug. Often times, people try 3 times or more before being able to quit. This document explains the best ways for you to prepare to quit smoking. Quitting takes hard work and a lot of effort, but you can do it. ADVANTAGES OF QUITTING SMOKING  You will live longer, feel better,  and live better.  Your body will feel the impact of quitting smoking almost immediately.  Within 20 minutes, blood pressure decreases. Your pulse returns to its normal level.  After 8 hours, carbon monoxide levels in the blood return to normal. Your oxygen level increases.  After 24 hours, the chance of having a heart attack starts to decrease. Your breath, hair, and body stop smelling like smoke.  After 48 hours, damaged nerve endings begin to recover. Your sense of taste and smell improve.  After 72 hours, the body is virtually free of nicotine. Your bronchial tubes relax and breathing becomes easier.  After 2 to 12 weeks, lungs can hold more air. Exercise becomes easier and circulation improves.  The risk of having a heart attack, stroke, cancer, or lung disease is greatly reduced.  After 1 year, the risk of coronary heart disease is cut in half.  After 5 years, the risk of stroke falls to the same as a nonsmoker.  After 10 years, the risk of lung cancer is cut in half and the risk of other cancers decreases significantly.  After 15 years, the risk of coronary heart disease drops, usually to the level of a nonsmoker.  If you are pregnant, quitting smoking will improve your chances of having a healthy baby.  The people you live with, especially any children, will be healthier.  You will have extra money to spend on things other than cigarettes. QUESTIONS TO THINK ABOUT BEFORE ATTEMPTING TO QUIT You may want to talk about your answers with your caregiver.  Why do you want to quit?  If you tried to quit in the past, what helped and what did not?  What will be the most difficult situations for you after you quit? How will you plan to handle them?  Who can help you through the tough times? Your family? Friends? A caregiver?  What pleasures do you get from smoking? What ways can you still get pleasure if you quit? Here are some questions to ask your caregiver:  How can you  help me to be successful at quitting?  What medicine do you think would be best for me and how should I take it?  What should I do if I need more help?  What is smoking withdrawal like? How can I get information on withdrawal? GET READY  Set a quit date.  Change your environment by getting rid of all cigarettes, ashtrays, matches, and lighters in your home, car, or work. Do not let people smoke in your home.  Review your past attempts to quit. Think about what worked and what did not. GET SUPPORT AND ENCOURAGEMENT You have a better chance of being successful if you have help. You can get support in many ways.  Tell your family, friends, and co-workers that you are going to quit and need their support. Ask them not to smoke around you.  Get individual, group, or telephone counseling and support. Programs are available at local hospitals and health centers. Call your local health department for information about programs in your area.  Spiritual beliefs and practices may help some smokers quit.  Download a "quit meter" on your computer   to keep track of quit statistics, such as how long you have gone without smoking, cigarettes not smoked, and money saved.  Get a self-help book about quitting smoking and staying off of tobacco. [place] yourself from urges to smoke. Talk to someone, go for a walk, or occupy your time with a task.  Change your normal routine. Take a different route to work. Drink tea instead of coffee. Eat breakfast in a different place.  Reduce your stress. Take a hot bath, exercise, or read a book.  Plan something enjoyable to do every day. Reward yourself for not smoking.  Explore interactive web-based programs that specialize in helping you quit. GET MEDICINE AND USE IT CORRECTLY Medicines can help you stop smoking and decrease the urge to smoke. Combining medicine with the above behavioral methods and support can greatly increase  your chances of successfully quitting smoking.  Nicotine replacement therapy helps deliver nicotine to your body without the negative effects and risks of smoking. Nicotine replacement therapy includes nicotine gum, lozenges, inhalers, nasal sprays, and skin patches. Some may be available over-the-counter and others require a prescription.  Antidepressant medicine helps people abstain from smoking, but how this works is unknown. This medicine is available by prescription.  Nicotinic receptor partial agonist medicine simulates the effect of nicotine in your brain. This medicine is available by prescription. Ask your caregiver for advice about which medicines to use and how to use them based on your health history. Your caregiver will tell you what side effects to look out for if you choose to be on a medicine or therapy. Carefully read the information on the package. Do not use any other product containing nicotine while using a nicotine replacement product.  RELAPSE OR DIFFICULT SITUATIONS Most relapses occur within the first 3 months after quitting. Do not be discouraged if you start smoking again. Remember, most people try several times before finally quitting. You may have symptoms of withdrawal because your body is used to nicotine. You may crave cigarettes, be irritable, feel very hungry, cough often, get headaches, or have difficulty concentrating. The withdrawal symptoms are only temporary. They are strongest when you first quit, but they will go away within 10 14 days. To reduce the chances of relapse, try to:  Avoid drinking alcohol. Drinking lowers your chances of successfully quitting.  Reduce the amount of caffeine you consume. Once you quit smoking, the amount of caffeine in your body increases and can give you symptoms, such as a rapid heartbeat, sweating, and anxiety.  Avoid smokers because they can make you want to smoke.  Do not let weight gain distract you. Many smokers will gain  weight when they quit, usually less than 10 pounds. Eat a healthy diet and stay active. You can always lose the weight gained after you quit.  Find ways to improve your mood other than smoking. FOR MORE INFORMATION  www.smokefree.gov  Document Released: 01/12/2001 Document Revised: 07/20/2011 Document Reviewed: 04/29/2011 [facility] Patient Information 2014 [place], [place].   Peripheral Vascular Disease Peripheral Vascular Disease (PVD), also called Peripheral Arterial Disease (PAD), is a circulation problem caused by cholesterol (atherosclerotic plaque) deposits in the arteries. PVD commonly occurs in the lower extremities (legs) but it can occur in other areas of the body, such as your arms. The cholesterol buildup in the arteries reduces blood flow which can cause pain and other serious problems. The presence of PVD can place a person at risk for Coronary Artery Disease (CAD).  CAUSES  Causes of PVD can be many. It is usually associated with more than one risk factor such as:   High Cholesterol.  Smoking.  Diabetes.  Lack of exercise or inactivity.  High blood pressure (hypertension).  Obesity.  Family history. SYMPTOMS   When the lower extremities are affected, patients with PVD may experience:  Leg pain with exertion or physical activity. This is called INTERMITTENT CLAUDICATION. This may present as cramping or numbness with physical activity. The location of the pain is associated with the level of blockage. For example, blockage at the abdominal level (distal abdominal aorta) may result in buttock or hip pain. Lower leg arterial blockage may result in calf pain.  As PVD becomes more severe, pain can develop with less physical activity.  In people with severe PVD, leg pain may occur at rest.  Other PVD signs and symptoms:  Leg numbness or weakness.  Coldness in the affected leg or foot, especially when compared to the other leg.  A change in leg color.  Patients with  significant PVD are more prone to ulcers or sores on toes, feet or legs. These may take longer to heal or may reoccur. The ulcers or sores can become infected.  If signs and symptoms of PVD are ignored, gangrene may occur. This can result in the loss of toes or loss of an entire limb.  Not all leg pain is related to PVD. Other medical conditions can cause leg pain such as:  Blood clots (embolism) or Deep Vein Thrombosis.  Inflammation of the blood vessels (vasculitis).  Spinal stenosis. DIAGNOSIS  Diagnosis of PVD can involve several different types of tests. These can include:  Pulse Volume Recording Method (PVR). This test is simple, painless and does not involve the use of X-rays. PVR involves measuring and comparing the blood pressure in the arms and legs. An ABI (Ankle-Brachial Index) is calculated. The normal ratio of blood pressures is 1. As this number becomes smaller, it indicates more severe disease.  < 0.95  indicates significant narrowing in one or more leg vessels.  <0.8 there will usually be pain in the foot, leg or buttock with exercise.  <0.4 will usually have pain in the legs at rest.  <0.25  usually indicates limb threatening PVD.  Doppler detection of pulses in the legs. This test is painless and checks to see if you have a pulses in your legs/feet.  A dye or contrast material (a substance that highlights the blood vessels so they show up on x-ray) may be given to help your caregiver better see the arteries for the following tests. The dye is eliminated from your body by the kidney's. Your caregiver may order blood work to check your kidney function and other laboratory values before the following tests are performed:  Magnetic Resonance Angiography (MRA). An MRA is a picture study of the blood vessels and arteries. The MRA machine uses a large magnet to produce images of the blood vessels.  Computed Tomography Angiography (CTA). A CTA is a specialized x-ray that  looks at how the blood flows in your blood vessels. An IV may be inserted into your arm so contrast dye can be injected.  Angiogram. Is a procedure that uses x-rays to look at your blood vessels. This procedure is minimally invasive, meaning a small incision (cut) is made in your groin. A small tube (catheter) is then inserted into the artery of your groin. The catheter is guided to the blood vessel or artery your  caregiver wants to examine. Contrast dye is injected into the catheter. X-rays are then taken of the blood vessel or artery. After the images are obtained, the catheter is taken out. TREATMENT  Treatment of PVD involves many interventions which may include:  Lifestyle changes:  Quitting smoking.  Exercise.  Following a low fat, low cholesterol diet.  Control of diabetes.  Foot care is very important to the PVD patient. Good foot care can help prevent infection.  Medication:  Cholesterol-lowering medicine.  Blood pressure medicine.  Anti-platelet drugs.  Certain medicines may reduce symptoms of Intermittent Claudication.  Interventional/Surgical options:  Angioplasty. An Angioplasty is a procedure that inflates a balloon in the blocked artery. This opens the blocked artery to improve blood flow.  Stent Implant. A wire mesh tube (stent) is placed in the artery. The stent expands and stays in place, allowing the artery to remain open.  Peripheral Bypass Surgery. This is a surgical procedure that reroutes the blood around a blocked artery to help improve blood flow. This type of procedure may be performed if Angioplasty or stent implants are not an option. SEEK IMMEDIATE MEDICAL CARE IF:   You develop pain or numbness in your arms or legs.  Your arm or leg turns cold, becomes blue in color.  You develop redness, warmth, swelling and pain in your arms or legs. MAKE SURE YOU:   Understand these instructions.  Will watch your condition.  Will get help right away if  you are not doing well or get worse. Document Released: 02/26/2004 Document Revised: 04/12/2011 Document Reviewed: 01/23/2008 [facility] Patient Information 2014 [place], [place].

## 2013-07-24 ENCOUNTER — Other Ambulatory Visit: Payer: Self-pay | Admitting: Gastroenterology

## 2013-07-24 ENCOUNTER — Other Ambulatory Visit: Payer: Self-pay | Admitting: Internal Medicine

## 2013-07-24 ENCOUNTER — Encounter: Payer: Self-pay | Admitting: *Deleted

## 2013-07-24 DIAGNOSIS — R1084 Generalized abdominal pain: Secondary | ICD-10-CM

## 2013-07-30 ENCOUNTER — Ambulatory Visit
Admission: RE | Admit: 2013-07-30 | Discharge: 2013-07-30 | Disposition: A | Discharge status: Home / Self Care | Payer: Commercial Managed Care - HMO | Source: Ambulatory Visit | Attending: Gastroenterology | Admitting: Gastroenterology

## 2013-07-30 DIAGNOSIS — R1084 Generalized abdominal pain: Secondary | ICD-10-CM

## 2013-07-30 MED ORDER — IOHEXOL 300 MG/ML  SOLN
100.0000 mL | Freq: Once | INTRAMUSCULAR | Status: AC | PRN
  Administered 2013-07-30: 100 mL via INTRAVENOUS

## 2013-08-10 ENCOUNTER — Encounter: Payer: Self-pay | Admitting: Pulmonary Disease

## 2013-08-10 ENCOUNTER — Encounter (INDEPENDENT_AMBULATORY_CARE_PROVIDER_SITE_OTHER): Payer: Self-pay

## 2013-08-10 ENCOUNTER — Ambulatory Visit (INDEPENDENT_AMBULATORY_CARE_PROVIDER_SITE_OTHER): Payer: Commercial Managed Care - HMO | Admitting: Pulmonary Disease

## 2013-08-10 VITALS — BP 130/80 | HR 70 | Temp 97.6°F | Ht 65.0 in | Wt 126.8 lb

## 2013-08-10 DIAGNOSIS — R911 Solitary pulmonary nodule: Secondary | ICD-10-CM

## 2013-08-10 NOTE — Assessment & Plan Note (Signed)
The patient has had a recent CT of her abdomen which showed an incidental 6 mm nodule in the right lower lobe. The patient is at high risk for bronchogenic cancer with her smoking history, and also at risk for metastatic disease from her prior breast cancer. She will need complete imaging of her chest, and if no other findings are noted,  will need a followup scan at 6 months per Fleischner criteria.  The patient is agreeable to this approach. I've also encouraged her to work aggressively on total smoking cessation.

## 2013-08-10 NOTE — Progress Notes (Signed)
   Subjective:    Patient ID: Kristen Bradley, female    DOB: 12-11-1945, 68 y.o.   MRN: [id number]  HPI The patient is a 68 year old female who been asked to see for a right lower lobe pulmonary nodule. She had been having abdominal complaints, and underwent a CT of her abdomen which showed a 6 mm nodule in the right lower lobe and mild radiation changes in the right middle lobe. She has not had a total imaging of her chest. She has a long history of smoking, and also a history of breast cancer in 2008 treated with lumpectomy and radiation. She has not seen an oncologist since 2008, but tells me that her mammograms are up to date. She has never lived in the [place] or [place], and has no known history of TB exposure. She has not had a PPD placed in the past. She denies any unexplained weight loss, and has not had any pleuritic chest pain or hemoptysis.   Review of Systems  Constitutional: Negative for fever and unexpected weight change.  HENT: Negative for congestion, dental problem, ear pain, nosebleeds, postnasal drip, rhinorrhea, sinus pressure, sneezing, sore throat and trouble swallowing.   Eyes: Negative for redness and itching.  Respiratory: Negative for cough, chest tightness, shortness of breath and wheezing.   Cardiovascular: Negative for palpitations and leg swelling.  Gastrointestinal: Negative for nausea and vomiting.  Genitourinary: Negative for dysuria.  Musculoskeletal: Negative for joint swelling.  Skin: Negative for rash.  Neurological: Negative for headaches.  Hematological: Does not bruise/bleed easily.  Psychiatric/Behavioral: Negative for dysphoric mood. The patient is not nervous/anxious.        Objective:   Physical Exam Constitutional:  Well developed, no acute distress  HENT:  Nares patent without discharge  Oropharynx without exudate, palate and uvula are normal  Eyes:  Perrla, eomi, no scleral icterus  Neck:  No JVD, no TMG  Cardiovascular:  Normal rate,  regular rhythm, no rubs or gallops.  No murmurs        Intact distal pulses  Pulmonary :  Mildly decreased breath sounds, no stridor or respiratory distress   No rales, rhonchi, or wheezing  Abdominal:  Soft, nondistended, bowel sounds present.  No tenderness noted.   Musculoskeletal:  No lower extremity edema noted.  Lymph Nodes:  No cervical lymphadenopathy noted  Skin:  No cyanosis noted  Neurologic:  Alert, appropriate, moves all 4 extremities without obvious deficit.         Assessment & Plan:

## 2013-08-10 NOTE — Patient Instructions (Signed)
Will schedule for a scan of your whole chest to make sure no other spots or other abnormalities.  Will call you with results. If the one spot is the only finding on the scan, will do another followup in 56mos.  Work on stopping smoking.

## 2013-08-16 ENCOUNTER — Ambulatory Visit (INDEPENDENT_AMBULATORY_CARE_PROVIDER_SITE_OTHER)
Admission: RE | Admit: 2013-08-16 | Discharge: 2013-08-16 | Disposition: A | Discharge status: Home / Self Care | Payer: Commercial Managed Care - HMO | Source: Ambulatory Visit | Attending: Pulmonary Disease | Admitting: Pulmonary Disease

## 2013-08-16 DIAGNOSIS — R911 Solitary pulmonary nodule: Secondary | ICD-10-CM

## 2013-08-21 ENCOUNTER — Other Ambulatory Visit: Payer: Self-pay | Admitting: Family Medicine

## 2013-08-21 DIAGNOSIS — Z853 Personal history of malignant neoplasm of breast: Secondary | ICD-10-CM

## 2013-08-29 NOTE — Progress Notes (Signed)
Quick Note:  ATC pt on home number > line rang numerous times with no answer and no option to LM. ATC pt on cell number > line rang numerous times and when line went to VM, was told pt does not have VM set up. ______

## 2013-09-03 ENCOUNTER — Ambulatory Visit (INDEPENDENT_AMBULATORY_CARE_PROVIDER_SITE_OTHER): Payer: Commercial Managed Care - HMO | Admitting: Pulmonary Disease

## 2013-09-03 ENCOUNTER — Encounter: Payer: Self-pay | Admitting: Pulmonary Disease

## 2013-09-03 VITALS — BP 118/80 | HR 64 | Temp 98.5°F | Ht 65.0 in | Wt 122.0 lb

## 2013-09-03 DIAGNOSIS — R911 Solitary pulmonary nodule: Secondary | ICD-10-CM

## 2013-09-03 NOTE — Progress Notes (Signed)
   Subjective:    Patient ID: Kristen Bradley, female    DOB: [birth year], 68 y.o.   MRN: [id number]  HPI Patient comes in today for followup of her recent chest CT, for a 6 mm right lower lobe nodule. This was first noted on a CT abdomen, and she needs a complete chest CT in order to make sure there was no further disease. Her chest CT showed no other abnormality except for a tiny nodule in the right upper lobe that was barely seen. I have reviewed the results with her in detail, and answered all of her questions.   Review of Systems  Constitutional: Negative for fever and unexpected weight change.  HENT: Negative for congestion, dental problem, ear pain, nosebleeds, postnasal drip, rhinorrhea, sinus pressure, sneezing, sore throat and trouble swallowing.   Eyes: Negative for redness and itching.  Respiratory: Negative for cough, chest tightness, shortness of breath and wheezing.   Cardiovascular: Negative for palpitations and leg swelling.  Gastrointestinal: Negative for nausea and vomiting.  Genitourinary: Negative for dysuria.  Musculoskeletal: Negative for joint swelling.  Skin: Negative for rash.  Neurological: Negative for headaches.  Hematological: Does not bruise/bleed easily.  Psychiatric/Behavioral: Negative for dysphoric mood. The patient is not nervous/anxious.        Objective:   Physical Exam Thin female in no acute distress Nose without purulence or discharge noted Neck without lymphadenopathy or thyromegaly Chest fairly clear Cardiac exam with regular rate and rhythm Lower extremities without edema, no cyanosis Alert and oriented, moves all 4 extremities.       Assessment & Plan:

## 2013-09-03 NOTE — Assessment & Plan Note (Signed)
The patient has a solitary pulmonary nodule that is 6 mm in the right lower lobe, and has increased from 3 mm in 2012. I have reviewed with her again the various ways to handle this, including surveillance, biopsy, and surgical resection. It is too small to biopsy, and also too small to accurately characterize by PET scan.  I have discussed with her referring to thoracic surgery for surgical resection versus a 3 month followup. After a long discussion, we have agreed to do the follow up in 3 months, but she will also need pulmonary function studies to evaluate her for resectability.

## 2013-09-03 NOTE — Patient Instructions (Signed)
Will schedule for breathing tests, and call you with results. Will schedule for followup scan of your chest in 60mos. You really need to stop smoking. Will call you with results of chest scan once done and talk about our plan going forward.

## 2013-09-20 ENCOUNTER — Ambulatory Visit
Admission: RE | Admit: 2013-09-20 | Discharge: 2013-09-20 | Disposition: A | Discharge status: Home / Self Care | Payer: Commercial Managed Care - HMO | Source: Ambulatory Visit | Attending: Family Medicine | Admitting: Family Medicine

## 2013-09-20 ENCOUNTER — Encounter (INDEPENDENT_AMBULATORY_CARE_PROVIDER_SITE_OTHER): Payer: Self-pay

## 2013-09-20 DIAGNOSIS — Z853 Personal history of malignant neoplasm of breast: Secondary | ICD-10-CM

## 2013-09-24 ENCOUNTER — Other Ambulatory Visit: Payer: Self-pay | Admitting: Gastroenterology

## 2013-12-07 ENCOUNTER — Ambulatory Visit: Payer: Commercial Managed Care - HMO | Admitting: Family

## 2013-12-07 ENCOUNTER — Encounter (HOSPITAL_COMMUNITY): Payer: Commercial Managed Care - HMO

## 2013-12-10 ENCOUNTER — Other Ambulatory Visit: Payer: Commercial Managed Care - HMO

## 2013-12-14 ENCOUNTER — Encounter: Payer: Self-pay | Admitting: Family

## 2013-12-17 ENCOUNTER — Ambulatory Visit (HOSPITAL_COMMUNITY)
Admission: RE | Admit: 2013-12-17 | Discharge: 2013-12-17 | Disposition: A | Discharge status: Home / Self Care | Payer: Medicare HMO | Source: Ambulatory Visit | Attending: Family | Admitting: Family

## 2013-12-17 ENCOUNTER — Ambulatory Visit (INDEPENDENT_AMBULATORY_CARE_PROVIDER_SITE_OTHER): Payer: Commercial Managed Care - HMO | Admitting: Family

## 2013-12-17 ENCOUNTER — Encounter: Payer: Self-pay | Admitting: Family

## 2013-12-17 VITALS — BP 128/64 | HR 57 | Resp 16 | Ht 65.5 in | Wt 129.0 lb

## 2013-12-17 DIAGNOSIS — F17210 Nicotine dependence, cigarettes, uncomplicated: Secondary | ICD-10-CM | POA: Diagnosis not present

## 2013-12-17 DIAGNOSIS — E119 Type 2 diabetes mellitus without complications: Secondary | ICD-10-CM | POA: Diagnosis not present

## 2013-12-17 DIAGNOSIS — I739 Peripheral vascular disease, unspecified: Secondary | ICD-10-CM

## 2013-12-17 DIAGNOSIS — I10 Essential (primary) hypertension: Secondary | ICD-10-CM | POA: Diagnosis not present

## 2013-12-17 DIAGNOSIS — E785 Hyperlipidemia, unspecified: Secondary | ICD-10-CM | POA: Diagnosis not present

## 2013-12-17 MED ORDER — CILOSTAZOL 100 MG PO TABS: 100.0000 mg | ORAL_TABLET | Freq: Two times a day (BID) | ORAL | Status: DC

## 2013-12-17 NOTE — Patient Instructions (Addendum)
Stroke Prevention Some medical conditions and behaviors are associated with an increased chance of having a stroke. You may prevent a stroke by making healthy choices and managing medical conditions. HOW CAN I REDUCE MY RISK OF HAVING A STROKE?   Stay physically active. Get at least 30 minutes of activity on most or all days.  Do not smoke. It may also be helpful to avoid exposure to secondhand smoke.  Limit alcohol use. Moderate alcohol use is considered to be:  No more than 2 drinks per day for men.  No more than 1 drink per day for nonpregnant women.  Eat healthy foods. This involves:  Eating 5 or more servings of fruits and vegetables a day.  Making dietary changes that address high blood pressure (hypertension), high cholesterol, diabetes, or obesity.  Manage your cholesterol levels.  Making food choices that are high in fiber and low in saturated fat, trans fat, and cholesterol may control cholesterol levels.  Take any prescribed medicines to control cholesterol as directed by your health care provider.  Manage your diabetes.  Controlling your carbohydrate and sugar intake is recommended to manage diabetes.  Take any prescribed medicines to control diabetes as directed by your health care provider.  Control your hypertension.  Making food choices that are low in salt (sodium), saturated fat, trans fat, and cholesterol is recommended to manage hypertension.  Take any prescribed medicines to control hypertension as directed by your health care provider.  Maintain a healthy weight.  Reducing calorie intake and making food choices that are low in sodium, saturated fat, trans fat, and cholesterol are recommended to manage weight.  Stop drug abuse.  Avoid taking birth control pills.  Talk to your health care provider about the risks of taking birth control pills if you are over 35 years old, smoke, get migraines, or have ever had a blood clot.  Get evaluated for sleep  disorders (sleep apnea).  Talk to your health care provider about getting a sleep evaluation if you snore a lot or have excessive sleepiness.  Take medicines only as directed by your health care provider.  For some people, aspirin or blood thinners (anticoagulants) are helpful in reducing the risk of forming abnormal blood clots that can lead to stroke. If you have the irregular heart rhythm of atrial fibrillation, you should be on a blood thinner unless there is a good reason you cannot take them.  Understand all your medicine instructions.  Make sure that other conditions (such as anemia or atherosclerosis) are addressed. SEEK IMMEDIATE MEDICAL CARE IF:   You have sudden weakness or numbness of the face, arm, or leg, especially on one side of the body.  Your face or eyelid droops to one side.  You have sudden confusion.  You have trouble speaking (aphasia) or understanding.  You have sudden trouble seeing in one or both eyes.  You have sudden trouble walking.  You have dizziness.  You have a loss of balance or coordination.  You have a sudden, severe headache with no known cause.  You have new chest pain or an irregular heartbeat. Any of these symptoms may represent a serious problem that is an emergency. Do not wait to see if the symptoms will go away. Get medical help at once. Call your local emergency services (911 in [place] Do not drive yourself to the hospital. Document Released: 02/26/2004 Document Revised: 06/04/2013 Document Reviewed: 07/21/2012 [employer] Patient Information 2015 [employer]. This information is not intended to replace advice given   to you by your health care provider. Make sure you discuss any questions you have with your health care provider.     Peripheral Vascular Disease Peripheral Vascular Disease (PVD), also called Peripheral Arterial Disease (PAD), is a circulation problem caused by cholesterol (atherosclerotic plaque) deposits in the  arteries. PVD commonly occurs in the lower extremities (legs) but it can occur in other areas of the body, such as your arms. The cholesterol buildup in the arteries reduces blood flow which can cause pain and other serious problems. The presence of PVD can place a person at risk for Coronary Artery Disease (CAD).  CAUSES  Causes of PVD can be many. It is usually associated with more than one risk factor such as:   High Cholesterol.  Smoking.  Diabetes.  Lack of exercise or inactivity.  High blood pressure (hypertension).  Obesity.  Family history. SYMPTOMS   When the lower extremities are affected, patients with PVD may experience:  Leg pain with exertion or physical activity. This is called INTERMITTENT CLAUDICATION. This may present as cramping or numbness with physical activity. The location of the pain is associated with the level of blockage. For example, blockage at the abdominal level (distal abdominal aorta) may result in buttock or hip pain. Lower leg arterial blockage may result in calf pain.  As PVD becomes more severe, pain can develop with less physical activity.  In people with severe PVD, leg pain may occur at rest.  Other PVD signs and symptoms:  Leg numbness or weakness.  Coldness in the affected leg or foot, especially when compared to the other leg.  A change in leg color.  Patients with significant PVD are more prone to ulcers or sores on toes, feet or legs. These may take longer to heal or may reoccur. The ulcers or sores can become infected.  If signs and symptoms of PVD are ignored, gangrene may occur. This can result in the loss of toes or loss of an entire limb.  Not all leg pain is related to PVD. Other medical conditions can cause leg pain such as:  Blood clots (embolism) or Deep Vein Thrombosis.  Inflammation of the blood vessels (vasculitis).  Spinal stenosis. DIAGNOSIS  Diagnosis of PVD can involve several different types of tests. These  can include:  Pulse Volume Recording Method (PVR). This test is simple, painless and does not involve the use of X-rays. PVR involves measuring and comparing the blood pressure in the arms and legs. An ABI (Ankle-Brachial Index) is calculated. The normal ratio of blood pressures is 1. As this number becomes smaller, it indicates more severe disease.  < 0.95 - indicates significant narrowing in one or more leg vessels.  <0.8 - there will usually be pain in the foot, leg or buttock with exercise.  <0.4 - will usually have pain in the legs at rest.  <0.25 - usually indicates limb threatening PVD.  Doppler detection of pulses in the legs. This test is painless and checks to see if you have a pulses in your legs/feet.  A dye or contrast material (a substance that highlights the blood vessels so they show up on x-ray) may be given to help your caregiver better see the arteries for the following tests. The dye is eliminated from your body by the kidney's. Your caregiver may order blood work to check your kidney function and other laboratory values before the following tests are performed:  Magnetic Resonance Angiography (MRA). An MRA is a picture study of the   blood vessels and arteries. The MRA machine uses a large magnet to produce images of the blood vessels.  Computed Tomography Angiography (CTA). A CTA is a specialized x-ray that looks at how the blood flows in your blood vessels. An IV may be inserted into your arm so contrast dye can be injected.  Angiogram. Is a procedure that uses x-rays to look at your blood vessels. This procedure is minimally invasive, meaning a small incision (cut) is made in your groin. A small tube (catheter) is then inserted into the artery of your groin. The catheter is guided to the blood vessel or artery your caregiver wants to examine. Contrast dye is injected into the catheter. X-rays are then taken of the blood vessel or artery. After the images are obtained, the  catheter is taken out. TREATMENT  Treatment of PVD involves many interventions which may include:  Lifestyle changes:  Quitting smoking.  Exercise.  Following a low fat, low cholesterol diet.  Control of diabetes.  Foot care is very important to the PVD patient. Good foot care can help prevent infection.  Medication:  Cholesterol-lowering medicine.  Blood pressure medicine.  Anti-platelet drugs.  Certain medicines may reduce symptoms of Intermittent Claudication.  Interventional/Surgical options:  Angioplasty. An Angioplasty is a procedure that inflates a balloon in the blocked artery. This opens the blocked artery to improve blood flow.  Stent Implant. A wire mesh tube (stent) is placed in the artery. The stent expands and stays in place, allowing the artery to remain open.  Peripheral Bypass Surgery. This is a surgical procedure that reroutes the blood around a blocked artery to help improve blood flow. This type of procedure may be performed if Angioplasty or stent implants are not an option. SEEK IMMEDIATE MEDICAL CARE IF:   You develop pain or numbness in your arms or legs.  Your arm or leg turns cold, becomes blue in color.  You develop redness, warmth, swelling and pain in your arms or legs. MAKE SURE YOU:   Understand these instructions.  Will watch your condition.  Will get help right away if you are not doing well or get worse. Document Released: 02/26/2004 Document Revised: 04/12/2011 Document Reviewed: 01/23/2008 [facility] Patient Information 2015 [place], [place]. This information is not intended to replace advice given to you by your health care provider. Make sure you discuss any questions you have with your health care provider.     Smoking Cessation, Tips for Success If you are ready to quit smoking, congratulations! You have chosen to help yourself be healthier. Cigarettes bring nicotine, tar, carbon monoxide, and other irritants into your body.  Your lungs, heart, and blood vessels will be able to work better without these poisons. There are many different ways to quit smoking. Nicotine gum, nicotine patches, a nicotine inhaler, or nicotine nasal spray can help with physical craving. Hypnosis, support groups, and medicines help break the habit of smoking. WHAT THINGS CAN I DO TO MAKE QUITTING EASIER?  Here are some tips to help you quit for good:  Pick a date when you will quit smoking completely. Tell all of your friends and family about your plan to quit on that date.  Do not try to slowly cut down on the number of cigarettes you are smoking. Pick a quit date and quit smoking completely starting on that day.  Throw away all cigarettes.   Clean and remove all ashtrays from your home, work, and car.  On a card, write down your reasons for  quitting. Carry the card with you and read it when you get the urge to smoke.  Cleanse your body of nicotine. Drink enough water and fluids to keep your urine clear or pale yellow. Do this after quitting to flush the nicotine from your body.  Learn to predict your moods. Do not let a bad situation be your excuse to have a cigarette. Some situations in your life might tempt you into wanting a cigarette.  Never have "just one" cigarette. It leads to wanting another and another. Remind yourself of your decision to quit.  Change habits associated with smoking. If you smoked while driving or when feeling stressed, try other activities to replace smoking. Stand up when drinking your coffee. Brush your teeth after eating. Sit in a different chair when you read the paper. Avoid alcohol while trying to quit, and try to drink fewer caffeinated beverages. Alcohol and caffeine may urge you to smoke.  Avoid foods and drinks that can trigger a desire to smoke, such as sugary or spicy foods and alcohol.  Ask people who smoke not to smoke around you.  Have something planned to do right after eating or having a  cup of coffee. For example, plan to take a walk or exercise.  Try a relaxation exercise to calm you down and decrease your stress. Remember, you may be tense and nervous for the first 2 weeks after you quit, but this will pass.  Find new activities to keep your hands busy. Play with a pen, coin, or rubber band. Doodle or draw things on paper.  Brush your teeth right after eating. This will help cut down on the craving for the taste of tobacco after meals. You can also try mouthwash.   Use oral substitutes in place of cigarettes. Try using lemon drops, carrots, cinnamon sticks, or chewing gum. Keep them handy so they are available when you have the urge to smoke.  When you have the urge to smoke, try deep breathing.  Designate your home as a nonsmoking area.  If you are a heavy smoker, ask your health care provider about a prescription for nicotine chewing gum. It can ease your withdrawal from nicotine.  Reward yourself. Set aside the cigarette money you save and buy yourself something nice.  Look for support from others. Join a support group or smoking cessation program. Ask someone at home or at work to help you with your plan to quit smoking.  Always ask yourself, "Do I need this cigarette or is this just a reflex?" Tell yourself, "Today, I choose not to smoke," or "I do not want to smoke." You are reminding yourself of your decision to quit.  Do not replace cigarette smoking with electronic cigarettes (commonly called e-cigarettes). The safety of e-cigarettes is unknown, and some may contain harmful chemicals.  If you relapse, do not give up! Plan ahead and think about what you will do the next time you get the urge to smoke. HOW WILL I FEEL WHEN I QUIT SMOKING? You may have symptoms of withdrawal because your body is used to nicotine (the addictive substance in cigarettes). You may crave cigarettes, be irritable, feel very hungry, cough often, get headaches, or have difficulty  concentrating. The withdrawal symptoms are only temporary. They are strongest when you first quit but will go away within 10-14 days. When withdrawal symptoms occur, stay in control. Think about your reasons for quitting. Remind yourself that these are signs that your body is healing and getting used  to being without cigarettes. Remember that withdrawal symptoms are easier to treat than the major diseases that smoking can cause.  Even after the withdrawal is over, expect periodic urges to smoke. However, these cravings are generally short lived and will go away whether you smoke or not. Do not smoke! WHAT RESOURCES ARE AVAILABLE TO HELP ME QUIT SMOKING? Your health care provider can direct you to community resources or hospitals for support, which may include:  Group support.  Education.  Hypnosis.  Therapy. Document Released: 10/17/2003 Document Revised: 06/04/2013 Document Reviewed: 07/06/2012 [facility] Patient Information 2015 [place], [place]. This information is not intended to replace advice given to you by your health care provider. Make sure you discuss any questions you have with your health care provider.   Smoking Cessation Quitting smoking is important to your health and has many advantages. However, it is not always easy to quit since nicotine is a very addictive drug. Oftentimes, people try 3 times or more before being able to quit. This document explains the best ways for you to prepare to quit smoking. Quitting takes hard work and a lot of effort, but you can do it. ADVANTAGES OF QUITTING SMOKING  You will live longer, feel better, and live better.  Your body will feel the impact of quitting smoking almost immediately.  Within 20 minutes, blood pressure decreases. Your pulse returns to its normal level.  After 8 hours, carbon monoxide levels in the blood return to normal. Your oxygen level increases.  After 24 hours, the chance of having a heart attack starts to decrease.  Your breath, hair, and body stop smelling like smoke.  After 48 hours, damaged nerve endings begin to recover. Your sense of taste and smell improve.  After 72 hours, the body is virtually free of nicotine. Your bronchial tubes relax and breathing becomes easier.  After 2 to 12 weeks, lungs can hold more air. Exercise becomes easier and circulation improves.  The risk of having a heart attack, stroke, cancer, or lung disease is greatly reduced.  After 1 year, the risk of coronary heart disease is cut in half.  After 5 years, the risk of stroke falls to the same as a nonsmoker.  After 10 years, the risk of lung cancer is cut in half and the risk of other cancers decreases significantly.  After 15 years, the risk of coronary heart disease drops, usually to the level of a nonsmoker.  If you are pregnant, quitting smoking will improve your chances of having a healthy baby.  The people you live with, especially any children, will be healthier.  You will have extra money to spend on things other than cigarettes. QUESTIONS TO THINK ABOUT BEFORE ATTEMPTING TO QUIT You may want to talk about your answers with your health care provider.  Why do you want to quit?  If you tried to quit in the past, what helped and what did not?  What will be the most difficult situations for you after you quit? How will you plan to handle them?  Who can help you through the tough times? Your family? Friends? A health care provider?  What pleasures do you get from smoking? What ways can you still get pleasure if you quit? Here are some questions to ask your health care provider:  How can you help me to be successful at quitting?  What medicine do you think would be best for me and how should I take it?  What should I do if I  need more help?  What is smoking withdrawal like? How can I get information on withdrawal? GET READY  Set a quit date.  Change your environment by getting rid of all  cigarettes, ashtrays, matches, and lighters in your home, car, or work. Do not let people smoke in your home.  Review your past attempts to quit. Think about what worked and what did not. GET SUPPORT AND ENCOURAGEMENT You have a better chance of being successful if you have help. You can get support in many ways.  Tell your family, friends, and coworkers that you are going to quit and need their support. Ask them not to smoke around you.  Get individual, group, or telephone counseling and support. Programs are available at [employer] and health centers. Call your local health department for information about programs in your area.  Spiritual beliefs and practices may help some smokers quit.  Download a "quit meter" on your computer to keep track of quit statistics, such as how long you have gone without smoking, cigarettes not smoked, and money saved.  Get a self-help book about quitting smoking and staying off tobacco. [place] yourself from urges to smoke. Talk to someone, go for a walk, or occupy your time with a task.  Change your normal routine. Take a different route to work. Drink tea instead of coffee. Eat breakfast in a different place.  Reduce your stress. Take a hot bath, exercise, or read a book.  Plan something enjoyable to do every day. Reward yourself for not smoking.  Explore interactive web-based programs that specialize in helping you quit. GET MEDICINE AND USE IT CORRECTLY Medicines can help you stop smoking and decrease the urge to smoke. Combining medicine with the above behavioral methods and support can greatly increase your chances of successfully quitting smoking.  Nicotine replacement therapy helps deliver nicotine to your body without the negative effects and risks of smoking. Nicotine replacement therapy includes nicotine gum, lozenges, inhalers, nasal sprays, and skin patches. Some may be available over-the-counter and  others require a prescription.  Antidepressant medicine helps people abstain from smoking, but how this works is unknown. This medicine is available by prescription.  Nicotinic receptor partial agonist medicine simulates the effect of nicotine in your brain. This medicine is available by prescription. Ask your health care provider for advice about which medicines to use and how to use them based on your health history. Your health care provider will tell you what side effects to look out for if you choose to be on a medicine or therapy. Carefully read the information on the package. Do not use any other product containing nicotine while using a nicotine replacement product.  RELAPSE OR DIFFICULT SITUATIONS Most relapses occur within the first 3 months after quitting. Do not be discouraged if you start smoking again. Remember, most people try several times before finally quitting. You may have symptoms of withdrawal because your body is used to nicotine. You may crave cigarettes, be irritable, feel very hungry, cough often, get headaches, or have difficulty concentrating. The withdrawal symptoms are only temporary. They are strongest when you first quit, but they will go away within 10-14 days. To reduce the chances of relapse, try to:  Avoid drinking alcohol. Drinking lowers your chances of successfully quitting.  Reduce the amount of caffeine you consume. Once you quit smoking, the amount of caffeine in your body increases and can give you symptoms, such as a rapid heartbeat, sweating, and  anxiety.  Avoid smokers because they can make you want to smoke.  Do not let weight gain distract you. Many smokers will gain weight when they quit, usually less than 10 pounds. Eat a healthy diet and stay active. You can always lose the weight gained after you quit.  Find ways to improve your mood other than smoking. FOR MORE INFORMATION  www.smokefree.gov  Document Released: 01/12/2001 Document Revised:  06/04/2013 Document Reviewed: 04/29/2011 [facility] Patient Information 2015 [place], [place]. This information is not intended to replace advice given to you by your health care provider. Make sure you discuss any questions you have with your health care provider.

## 2013-12-17 NOTE — Progress Notes (Signed)
[facility] [place] HISTORY AND PHYSICAL   MRN : [id number]  History of Present Illness:   Kristen Bradley is a 68 y.o. female patient of Dr. [name] who is s/p right CEA on 01/05/2012, and left CEA using patch angioplasty Vascu-Guard bovine patch 1cm x 6cm on 02/08/2012.  She also had PAD. She returns today for follow up. The patient has never had amaurosis fugax or monocular blindness. The patient has never had facial drooping or hemiplegia. The patient has mever had receptive or expressive aphasia. The patient previously had some L corner of mouth droop after her R CEA, which has since resolved. The patient continues to have some non-lifestyle limiting intermittent claudication. She is not doing a walking plan. She notes some intermittent Left lower leg numbness without other radicular sx. Pt continues to smoke.   At her visit with Dr. [name] on 01/26/2013 his assessment was the following:  Her sx pattern for her Left leg and ABI are not consistent with L PAD as the etiology for her sx. I would consider spinal evaluation with +/- EMG/NCV studies. The patient needs to stop smoking otherwise her carotid arteries are likely to restenose and her PAD will progress with time. The pt's prior left corner of mouth drooping is NOT consistent with marginal mandibular drooping as it was CONTRALATERAL to the R CEA, suggesting possible small contralateral CVA.  Pt denies any new stroke or TIA symptoms.  She complains of bilateral calf pain almost immediately with walking at times, left calf worse than than right.  She also has occassional bilateral buttocks and thigh pain with walking. She denies any knowledge of taking Pletal, states she does not have CHF, prescription sent to [facility] at pt request. Pt denies non healing wounds.   Pt Diabetic: Yes, states her last A1C was under 7.0.  Pt smoker: smoker (1/2 ppd, started smoking at about age 67), states she stopped several times   Pt meds  include:  Statin :Yes  Betablocker: Yes  ASA: Yes  Other anticoagulants/antiplatelets: Plavix, Pletal   Current Outpatient Prescriptions  Medication Sig Dispense Refill  . amLODipine (NORVASC) 10 MG tablet Take 10 mg by mouth at bedtime.      Marland Kitchen aspirin EC 81 MG tablet Take 81 mg by mouth daily.      Marland Kitchen atenolol (TENORMIN) 100 MG tablet Take 100 mg by mouth every morning.      . cilostazol (PLETAL) 100 MG tablet Take 1 tablet (100 mg total) by mouth 2 (two) times daily before a meal. 60 tablet 11  . cloNIDine (CATAPRES) 0.1 MG tablet Take 0.1 mg by mouth 2 (two) times daily.      . clopidogrel (PLAVIX) 75 MG tablet Take 1 tablet (75 mg total) by mouth daily. 30 tablet 11  . gemfibrozil (LOPID) 600 MG tablet Take 600 mg by mouth 2 (two) times daily.      . hydrochlorothiazide (HYDRODIURIL) 12.5 MG tablet Take 12.5 mg by mouth daily.     Marland Kitchen losartan (COZAAR) 100 MG tablet Take 100 mg by mouth daily.     . metFORMIN (GLUCOPHAGE) 500 MG tablet Take 500 mg by mouth 2 (two) times daily with a meal.      . PRODIGY NO CODING BLOOD GLUC test strip 3 (three) times daily.     Marland Kitchen PRODIGY TWIST TOP LANCETS 28G MISC 3 (three) times daily.     . simvastatin (ZOCOR) 40 MG tablet Take 40 mg by mouth daily.     Marland Kitchen  traMADol (ULTRAM) 50 MG tablet Take 1 tablet (50 mg total) by mouth every 6 (six) hours as needed. 30 tablet 0   No current facility-administered medications for this visit.    Past Medical History  Diagnosis Date  . High cholesterol     takes Lopid daily  . Carotid artery disease   . Tobacco abuse   . Breast cancer     s/p lumpectomy, XRT  . Renal artery stenosis     Right renal artery stent 05/31/06  . Hepatitis C   . Coronary artery disease     1 stent   . Hypertension     takes Losartan,Amlodipine,HCTZ,and Atenolol daily and Clatarpres  . Joint pain     legs  . History of UTI     takes Diflucan dail--pt states no uti in a couple of yrs though  . Diabetes mellitus     takes  Metformin daily  . Stroke Jan. 7, 2014    Social History History  Substance Use Topics  . Smoking status: Current Every Day Smoker -- 0.25 packs/day for 50 years    Types: Cigarettes    Last Attempt to Quit: 12/29/2011  . Smokeless tobacco: Never Used     Comment: pt states she only smokes 2-3 cigs per day  . Alcohol Use: No    Family History Family History  Problem Relation Age of Onset  . Cancer Mother     ? type  . Cancer Father     ? type  . Diabetes Sister   . Hypertension Sister   . Diabetes Brother   . CAD Neg Hx     Surgical History Past Surgical History  Procedure Laterality Date  . Adrenal gland surgery    . Abdominal hysterectomy    . Breast lumpectomy      right  . Back surgery    . Coronary angioplasty  2008    1 stent  . Endarterectomy  01/05/2012    Procedure: ENDARTERECTOMY CAROTID;  Surgeon: Conrad [place], MD;  Location: [facility];  Service: Vascular;  Laterality: Right;  Right Carotid Artery Endarterectomy, right stump pressure, intraoperative ultrasound  . Endarterectomy  02/08/2012    Procedure: ENDARTERECTOMY CAROTID;  Surgeon: Conrad Cavalier, MD;  Location: [facility];  Service: Vascular;  Laterality: Left;  . Patch angioplasty  02/08/2012    Procedure: PATCH ANGIOPLASTY;  Surgeon: Conrad Pena Pob[place], MD;  Location: [facility] OR;  Service: Vascular;  Laterality: Left;  Carotid artery patch angioplasty using Vascu-Guard bovine patch 1cm x 6cm.  . Carotid endarterectomy    . Spine surgery      Allergies  Allergen Reactions  . Chlorhexidine Gluconate Itching  . Ace Inhibitors Cough  . Dilaudid [Hydromorphone Hcl] Itching    Current Outpatient Prescriptions  Medication Sig Dispense Refill  . amLODipine (NORVASC) 10 MG tablet Take 10 mg by mouth at bedtime.      Marland Kitchen aspirin EC 81 MG tablet Take 81 mg by mouth daily.      Marland Kitchen atenolol (TENORMIN) 100 MG tablet Take 100 mg by mouth every morning.      . cilostazol (PLETAL) 100 MG tablet Take 1 tablet (100 mg total) by mouth 2  (two) times daily before a meal. 60 tablet 11  . cloNIDine (CATAPRES) 0.1 MG tablet Take 0.1 mg by mouth 2 (two) times daily.      . clopidogrel (PLAVIX) 75 MG tablet Take 1 tablet (75 mg total) by mouth daily. 30 tablet  11  . gemfibrozil (LOPID) 600 MG tablet Take 600 mg by mouth 2 (two) times daily.      . hydrochlorothiazide (HYDRODIURIL) 12.5 MG tablet Take 12.5 mg by mouth daily.     Marland Kitchen losartan (COZAAR) 100 MG tablet Take 100 mg by mouth daily.     . metFORMIN (GLUCOPHAGE) 500 MG tablet Take 500 mg by mouth 2 (two) times daily with a meal.      . PRODIGY NO CODING BLOOD GLUC test strip 3 (three) times daily.     Marland Kitchen PRODIGY TWIST TOP LANCETS 28G MISC 3 (three) times daily.     . simvastatin (ZOCOR) 40 MG tablet Take 40 mg by mouth daily.     . traMADol (ULTRAM) 50 MG tablet Take 1 tablet (50 mg total) by mouth every 6 (six) hours as needed. 30 tablet 0   No current facility-administered medications for this visit.     REVIEW OF SYSTEMS: See HPI for pertinent positives and negatives.  Physical Examination Filed Vitals:   12/17/13 0933  BP: 128/64  Pulse: 57  Resp: 16  Height: 5' 5.5" (1.664 m)  Weight: 129 lb (58.514 kg)  SpO2: 100%   Body mass index is 21.13 kg/(m^2).  General: WDWN in NAD Gait: Normal HENT: WNL Eyes: PERRLA Pulmonary: normal non-labored breathing , without Rales, rhonchi, or wheezing, diminished breath sounds in all fields, occasional dry cough Cardiac: RRR, no murmurs detected  Abdomen: soft, NT, no masses Skin: no rashes, ulcers noted; no Gangrene , no cellulitis; no open wounds;   VASCULAR EXAM  Carotid Bruits Left Right   Positive Positive   Radial pulses are 1+ palpable and = Aorta is not palpable.   VASCULAR EXAM: Extremities without ischemic changes  without Gangrene; without open wounds.      LE Pulses LEFT RIGHT   FEMORAL 1+ palpable 1+ palpable    POPLITEAL not palpable  not palpable   POSTERIOR TIBIAL not palpable  not palpable    DORSALIS PEDIS  ANTERIOR TIBIAL not palpable  not palpable     Musculoskeletal: no muscle wasting or atrophy; no peripheral edema Neurologic: A&O X 3; Appropriate Affect ;  SENSATION: normal; MOTOR FUNCTION: 5/5 Symmetric, CN 2-12 intact Speech is fluent/normal    Non-Invasive Vascular Imaging:  ABI (Date: 12/17/2013)  R: 0.54 (0.63, 06/01/13), DP: 0.49, PT: 0.54, waveforms monophasic, TBI: 0.89  L: 0.39 (0.61), DP: 0.39, PT: 0.36, monophasic waveforms, TBI: 0.27    ASSESSMENT:  Kristen Bradley is a 68 y.o. female who is s/p right CEA on 01/05/2012, and left CEA using patch angioplasty Vascu-Guard bovine patch 1cm x 6cm on 02/08/2012.  She also has known PAD. ABI's today indicate severe arterial occlusive disease in right LE and critical ischemia in left LE, worse since the previous visit six months ago. She has bilateral calf pain almost immediately with walking, left worse than right, and occasional bilateral buttocks and thigh pain with walking. She has no tissue loss.  Carotid Duplex from May, 2015 indicated patent bilateral ICA/CEA sites. Her atherosclerotic risk factors include her continued smoking and DM. The arterial stenosis in her LE's and carotid arteries will likely continue to progress unless she stops smoking, gets her DM under control, and participates in a graduated walking program. Will add Pletal in order to facilitate walking and diminish LE claudication.  Consider adding a medication such as Januvia or Onglyza to reduce post prandial glycemic excursions, defer to pt's PCP.   PLAN:   The  patient was counseled re smoking cessation. Graduated walking program. Start Pletal  100 mg bid, she denies any history of CHF. Maximal medical management.  Based on today's exam and non-invasive vascular lab results, the patient will follow up in 6 months with the following tests: ABI's and carotid Duplex (already scheduled). I discussed in depth with the patient the nature of atherosclerosis, and emphasized the importance of maximal medical management including strict control of blood pressure, blood glucose, and lipid levels, obtaining regular exercise, and cessation of smoking.  The patient is aware that without maximal medical management the underlying atherosclerotic disease process will progress, limiting the benefit of any interventions.  The patient was given information about stroke prevention and what symptoms should prompt the patient to seek immediate medical care.  The patient was given information about PAD including signs, symptoms, treatment, what symptoms should prompt the patient to seek immediate medical care, and risk reduction measures to take. Thank you for allowing [place] to participate in this patient's care.  Clemon Chambers, RN, MSN, FNP-C [facility] Office: [phone]  Clinic MD: Trula Slade 12/17/2013 9:26 AM

## 2013-12-18 ENCOUNTER — Telehealth (HOSPITAL_COMMUNITY): Payer: Self-pay | Admitting: *Deleted

## 2013-12-18 NOTE — Telephone Encounter (Signed)
-----   Message from Ovidio Hanger sent at 12/14/2013  5:34 PM EST ----- Regarding: RE: Vascular Lab study sch'd 12/17/13 This is just a "seeking to understand" query :)  Would the information, below, from Suzanne's note be the reason to repeat the study in 6 months?  If so, should it be said a different way?   Jen  "ABI's are stable and indicate bilateral moderate arterial occlusive disease in both legs."  ----- Message -----    From: Rufina Falco    Sent: 12/12/2013   3:23 PM      To: Kathi Ludwig Nickel, NP Subject: Vascular Lab study sch'd 12/17/13              Abi's are scheduled 12/17/13, there hasn't been any intervention and there hasn't been any changes.  Questioning why are we repeating?  Please advise

## 2014-03-04 ENCOUNTER — Encounter: Payer: Self-pay | Admitting: Cardiovascular Disease

## 2014-03-04 ENCOUNTER — Ambulatory Visit (INDEPENDENT_AMBULATORY_CARE_PROVIDER_SITE_OTHER): Payer: Commercial Managed Care - HMO | Admitting: Cardiovascular Disease

## 2014-03-04 VITALS — BP 110/56 | HR 62 | Ht 65.0 in | Wt 137.0 lb

## 2014-03-04 DIAGNOSIS — I739 Peripheral vascular disease, unspecified: Secondary | ICD-10-CM

## 2014-03-04 DIAGNOSIS — F17201 Nicotine dependence, unspecified, in remission: Secondary | ICD-10-CM

## 2014-03-04 DIAGNOSIS — I119 Hypertensive heart disease without heart failure: Secondary | ICD-10-CM | POA: Diagnosis not present

## 2014-03-04 NOTE — Progress Notes (Signed)
History of Present Illness: 69 yo AAF with history of tobacco abuse, bilateral carotid artery stenosis, renal artery stenosis, hepatitis C, HTN, HLD, DM who is here today for cardiac follow up. I saw her 11/30/11 for pre-operative evaluation prior to planned carotid endarterectomy with Dr. [name] with [facility]. She had a stress test December 2012 in [facility] that was reported as normal. I arranged an echo 12/07/11 which showed normal LV and RV size and function with no significant valvular issues. Her carotid artery disease is followed by Dr. [name] in the [facility] office. She stopped smoking.   She is here today for follow up. No chest pain or SOB. No LE edema. She has been feeling well.    Primary Care Physician: Antony Blackbird Vascular Surgery: [name]  Past Medical History  Diagnosis Date  . High cholesterol     takes Lopid daily  . Carotid artery disease   . Tobacco abuse   . Breast cancer     s/p lumpectomy, XRT  . Renal artery stenosis     Right renal artery stent 05/31/06  . Hepatitis C   . Coronary artery disease     1 stent   . Hypertension     takes Losartan,Amlodipine,HCTZ,and Atenolol daily and Clatarpres  . Joint pain     legs  . History of UTI     takes Diflucan dail--pt states no uti in a couple of yrs though  . Diabetes mellitus     takes Metformin daily  . Stroke Jan. 7, 2014    Past Surgical History  Procedure Laterality Date  . Adrenal gland surgery    . Abdominal hysterectomy    . Breast lumpectomy      right  . Back surgery    . Coronary angioplasty  2008    1 stent  . Endarterectomy  01/05/2012    Procedure: ENDARTERECTOMY CAROTID;  Surgeon: Conrad Roselawn, MD;  Location: [place];  Service: Vascular;  Laterality: Right;  Right Carotid Artery Endarterectomy, right stump pressure, intraoperative ultrasound  . Endarterectomy  02/08/2012    Procedure: ENDARTERECTOMY CAROTID;  Surgeon: Conrad Easton, MD;  Location: [place];  Service: Vascular;  Laterality: Left;  . Patch  angioplasty  02/08/2012    Procedure: PATCH ANGIOPLASTY;  Surgeon: Conrad [place], MD;  Location: [facility] OR;  Service: Vascular;  Laterality: Left;  Carotid artery patch angioplasty using Vascu-Guard bovine patch 1cm x 6cm.  . Carotid endarterectomy    . Spine surgery      Current Outpatient Prescriptions  Medication Sig Dispense Refill  . amLODipine (NORVASC) 10 MG tablet Take 10 mg by mouth at bedtime.      Marland Kitchen aspirin EC 81 MG tablet Take 81 mg by mouth daily.      Marland Kitchen atenolol (TENORMIN) 100 MG tablet Take 100 mg by mouth every morning.      . cilostazol (PLETAL) 100 MG tablet Take 1 tablet (100 mg total) by mouth 2 (two) times daily before a meal. 180 tablet 3  . cloNIDine (CATAPRES) 0.1 MG tablet Take 0.1 mg by mouth 2 (two) times daily.      . clopidogrel (PLAVIX) 75 MG tablet Take 1 tablet (75 mg total) by mouth daily. 30 tablet 11  . gemfibrozil (LOPID) 600 MG tablet Take 600 mg by mouth 2 (two) times daily.      . hydrochlorothiazide (HYDRODIURIL) 12.5 MG tablet Take 12.5 mg by mouth daily.     Marland Kitchen losartan (COZAAR)  100 MG tablet Take 100 mg by mouth daily.     . metFORMIN (GLUCOPHAGE) 500 MG tablet Take 500 mg by mouth 2 (two) times daily with a meal.      . omeprazole (PRILOSEC) 40 MG capsule     . PRODIGY NO CODING BLOOD GLUC test strip 3 (three) times daily.     Marland Kitchen PRODIGY TWIST TOP LANCETS 28G MISC 3 (three) times daily.     . simvastatin (ZOCOR) 40 MG tablet Take 40 mg by mouth daily.     . traMADol (ULTRAM) 50 MG tablet Take 1 tablet (50 mg total) by mouth every 6 (six) hours as needed. 30 tablet 0   No current facility-administered medications for this visit.    Allergies  Allergen Reactions  . Chlorhexidine Gluconate Itching  . Ace Inhibitors Cough  . Dilaudid [Hydromorphone Hcl] Itching    History   Social History  . Marital Status: Divorced    Spouse Name: N/A    Number of Children: 1  . Years of Education: N/A   Occupational History  . Retired-truck driver     Social History Main Topics  . Smoking status: Former Smoker -- 0.25 packs/day for 50 years    Types: Cigarettes    Quit date: 12/29/2011  . Smokeless tobacco: Never Used     Comment: pt states she only smokes 2-3 cigs per day  . Alcohol Use: No  . Drug Use: No  . Sexual Activity: Yes    Birth Control/ Protection: Surgical   Other Topics Concern  . Not on file   Social History Narrative    Family History  Problem Relation Age of Onset  . Cancer Mother     ? type  . Cancer Father     ? type  . Diabetes Sister   . Hypertension Sister   . Diabetes Brother   . CAD Neg Hx     Review of Systems:  As stated in the HPI and otherwise negative.   BP 110/56 mmHg  Pulse 62  Ht 5\' 5"  (1.651 m)  Wt 137 lb (62.143 kg)  BMI 22.80 kg/m2  SpO2 99%  Physical Examination: General: Well developed, well nourished, NAD HEENT: OP clear, mucus membranes moist SKIN: warm, dry. No rashes. Neuro: No focal deficits Musculoskeletal: Muscle strength 5/5 all ext Psychiatric: Mood and affect normal Neck: No JVD, no carotid bruits, no thyromegaly, no lymphadenopathy. Lungs:Clear bilaterally, no wheezes, rhonci, crackles Cardiovascular: Regular rate and rhythm. No murmurs, gallops or rubs. Abdomen:Soft. Bowel sounds present. Non-tender.  Extremities: No lower extremity edema. Pulses are 2 + in the bilateral DP/PT.  Echo 12/07/11:  Left ventricle: Parameters are borderline for elevated filling pressure. The cavity size was normal. Wall thickness was increased in a pattern of mild LVH. The estimated ejection fraction was 60%. Wall motion was normal; there were no regional wall motion abnormalities. Doppler parameters are consistent with abnormal left ventricular relaxation (grade 1 diastolic dysfunction). - Right ventricle: The cavity size was normal. Systolic function was normal.  EKG: NSR, rate 62 bpm. Poor R wave progression. T wave inversions. Unchanged from 2013.   Assessment and  Plan:   1. Hypertensive heart disease: Mild LVH on echo 2013. She has no dyspnea or chest pain. BP is well controlled.    2. Tobacco abuse: She has stopped smoking.   3. PAD: Severe disease in both legs and bilateral carotid arteries. Following in [facility] by Dr. [name].

## 2014-03-04 NOTE — Patient Instructions (Signed)
Your physician wants you to follow-up in:  2 years.  You will receive a reminder letter in the mail two months in advance. If you don't receive a letter, please call our office to schedule the follow-up appointment.

## 2014-06-06 ENCOUNTER — Encounter: Payer: Self-pay | Admitting: Family

## 2014-06-07 ENCOUNTER — Ambulatory Visit (INDEPENDENT_AMBULATORY_CARE_PROVIDER_SITE_OTHER): Payer: Commercial Managed Care - HMO | Admitting: Family

## 2014-06-07 ENCOUNTER — Other Ambulatory Visit (HOSPITAL_COMMUNITY): Payer: Commercial Managed Care - HMO

## 2014-06-07 ENCOUNTER — Ambulatory Visit (HOSPITAL_COMMUNITY)
Admission: RE | Admit: 2014-06-07 | Discharge: 2014-06-07 | Disposition: A | Discharge status: Home / Self Care | Payer: Commercial Managed Care - HMO | Source: Ambulatory Visit | Attending: Family | Admitting: Family

## 2014-06-07 ENCOUNTER — Ambulatory Visit (INDEPENDENT_AMBULATORY_CARE_PROVIDER_SITE_OTHER)
Admission: RE | Admit: 2014-06-07 | Discharge: 2014-06-07 | Disposition: A | Discharge status: Home / Self Care | Payer: Commercial Managed Care - HMO | Source: Ambulatory Visit | Attending: Family | Admitting: Family

## 2014-06-07 ENCOUNTER — Ambulatory Visit: Payer: Commercial Managed Care - HMO | Admitting: Family

## 2014-06-07 ENCOUNTER — Encounter: Payer: Self-pay | Admitting: Family

## 2014-06-07 VITALS — BP 139/67 | HR 55 | Resp 14 | Ht 65.0 in | Wt 135.0 lb

## 2014-06-07 DIAGNOSIS — E1151 Type 2 diabetes mellitus with diabetic peripheral angiopathy without gangrene: Secondary | ICD-10-CM

## 2014-06-07 DIAGNOSIS — I70219 Atherosclerosis of native arteries of extremities with intermittent claudication, unspecified extremity: Secondary | ICD-10-CM

## 2014-06-07 DIAGNOSIS — E1159 Type 2 diabetes mellitus with other circulatory complications: Secondary | ICD-10-CM | POA: Diagnosis not present

## 2014-06-07 DIAGNOSIS — Z48812 Encounter for surgical aftercare following surgery on the circulatory system: Secondary | ICD-10-CM

## 2014-06-07 DIAGNOSIS — F17200 Nicotine dependence, unspecified, uncomplicated: Secondary | ICD-10-CM | POA: Diagnosis not present

## 2014-06-07 DIAGNOSIS — Z9889 Other specified postprocedural states: Secondary | ICD-10-CM | POA: Diagnosis not present

## 2014-06-07 DIAGNOSIS — Z720 Tobacco use: Secondary | ICD-10-CM

## 2014-06-07 DIAGNOSIS — I6523 Occlusion and stenosis of bilateral carotid arteries: Secondary | ICD-10-CM | POA: Insufficient documentation

## 2014-06-07 DIAGNOSIS — I739 Peripheral vascular disease, unspecified: Secondary | ICD-10-CM | POA: Diagnosis not present

## 2014-06-07 LAB — VAS US CAROTID
LCCAPDIAS: 12 cm/s
LEFT CCA MID DIAS: 13 R/I
LEFT CCA MID SYS: 72 cm/s
LEFT ECA DIAS: 0 cm/s
LICADDIAS: 14 cm/s
LICADSYS: 51 cm/s
LICAMSYS: 34 cm/s
LSUBSYS: 217 cm/s
Left CCA dist dias: 13 cm/s
Left CCA dist sys: 65 cm/s
Left CCA prox sys: 158 cm/s
Left ECA sys: 72 cm/s
Left ICA mid dias: 11 cm/s
Left ICA prox dias: 8 cm/s
Left ICA prox sys: 49 cm/s
RCCAPSYS: 159 cm/s
RICADSYS: 85 cm/s
RICAMDIAS: 10 cm/s
RICAMSYS: 33 cm/s
RICAPDIAS: 9 cm/s
RIGHT CCA MID DIAS: 7 cm/s
RIGHT CCA MID SYS: 66 cm/s
RIGHT ECA DIAS: 0 cm/s
RIGHT VERTEBRAL DIAS: 22 cm/s
RSUBDYSMID: 0 cm/s
RVERTSYS: 97 cm/s
Right CCA dist dias: 8 cm/s
Right CCA prox dias: 10 cm/s
Right ICA dist dias: 27 cm/s
Right ICA prox sys: 41 cm/s
Right cca dist sys: 61 cm/s
Right eca sys: 73 cm/s
Right subclavian sys: 134 cm/s

## 2014-06-07 MED ORDER — CILOSTAZOL 100 MG PO TABS: 100.0000 mg | ORAL_TABLET | Freq: Two times a day (BID) | ORAL | Status: DC

## 2014-06-07 NOTE — Patient Instructions (Signed)
Stroke Prevention Some medical conditions and behaviors are associated with an increased chance of having a stroke. You may prevent a stroke by making healthy choices and managing medical conditions. HOW CAN I REDUCE MY RISK OF HAVING A STROKE?   Stay physically active. Get at least 30 minutes of activity on most or all days.  Do not smoke. It may also be helpful to avoid exposure to secondhand smoke.  Limit alcohol use. Moderate alcohol use is considered to be:  No more than 2 drinks per day for men.  No more than 1 drink per day for nonpregnant women.  Eat healthy foods. This involves:  Eating 5 or more servings of fruits and vegetables a day.  Making dietary changes that address high blood pressure (hypertension), high cholesterol, diabetes, or obesity.  Manage your cholesterol levels.  Making food choices that are high in fiber and low in saturated fat, trans fat, and cholesterol may control cholesterol levels.  Take any prescribed medicines to control cholesterol as directed by your health care provider.  Manage your diabetes.  Controlling your carbohydrate and sugar intake is recommended to manage diabetes.  Take any prescribed medicines to control diabetes as directed by your health care provider.  Control your hypertension.  Making food choices that are low in salt (sodium), saturated fat, trans fat, and cholesterol is recommended to manage hypertension.  Take any prescribed medicines to control hypertension as directed by your health care provider.  Maintain a healthy weight.  Reducing calorie intake and making food choices that are low in sodium, saturated fat, trans fat, and cholesterol are recommended to manage weight.  Stop drug abuse.  Avoid taking birth control pills.  Talk to your health care provider about the risks of taking birth control pills if you are over 35 years old, smoke, get migraines, or have ever had a blood clot.  Get evaluated for sleep  disorders (sleep apnea).  Talk to your health care provider about getting a sleep evaluation if you snore a lot or have excessive sleepiness.  Take medicines only as directed by your health care provider.  For some people, aspirin or blood thinners (anticoagulants) are helpful in reducing the risk of forming abnormal blood clots that can lead to stroke. If you have the irregular heart rhythm of atrial fibrillation, you should be on a blood thinner unless there is a good reason you cannot take them.  Understand all your medicine instructions.  Make sure that other conditions (such as anemia or atherosclerosis) are addressed. SEEK IMMEDIATE MEDICAL CARE IF:   You have sudden weakness or numbness of the face, arm, or leg, especially on one side of the body.  Your face or eyelid droops to one side.  You have sudden confusion.  You have trouble speaking (aphasia) or understanding.  You have sudden trouble seeing in one or both eyes.  You have sudden trouble walking.  You have dizziness.  You have a loss of balance or coordination.  You have a sudden, severe headache with no known cause.  You have new chest pain or an irregular heartbeat. Any of these symptoms may represent a serious problem that is an emergency. Do not wait to see if the symptoms will go away. Get medical help at once. Call your local emergency services (911 in [place] Do not drive yourself to the hospital. Document Released: 02/26/2004 Document Revised: 06/04/2013 Document Reviewed: 07/21/2012 [employer] Patient Information 2015 [employer]. This information is not intended to replace advice given   to you by your health care provider. Make sure you discuss any questions you have with your health care provider.     Peripheral Vascular Disease Peripheral Vascular Disease (PVD), also called Peripheral Arterial Disease (PAD), is a circulation problem caused by cholesterol (atherosclerotic plaque) deposits in the  arteries. PVD commonly occurs in the lower extremities (legs) but it can occur in other areas of the body, such as your arms. The cholesterol buildup in the arteries reduces blood flow which can cause pain and other serious problems. The presence of PVD can place a person at risk for Coronary Artery Disease (CAD).  CAUSES  Causes of PVD can be many. It is usually associated with more than one risk factor such as:   High Cholesterol.  Smoking.  Diabetes.  Lack of exercise or inactivity.  High blood pressure (hypertension).  Obesity.  Family history. SYMPTOMS   When the lower extremities are affected, patients with PVD may experience:  Leg pain with exertion or physical activity. This is called INTERMITTENT CLAUDICATION. This may present as cramping or numbness with physical activity. The location of the pain is associated with the level of blockage. For example, blockage at the abdominal level (distal abdominal aorta) may result in buttock or hip pain. Lower leg arterial blockage may result in calf pain.  As PVD becomes more severe, pain can develop with less physical activity.  In people with severe PVD, leg pain may occur at rest.  Other PVD signs and symptoms:  Leg numbness or weakness.  Coldness in the affected leg or foot, especially when compared to the other leg.  A change in leg color.  Patients with significant PVD are more prone to ulcers or sores on toes, feet or legs. These may take longer to heal or may reoccur. The ulcers or sores can become infected.  If signs and symptoms of PVD are ignored, gangrene may occur. This can result in the loss of toes or loss of an entire limb.  Not all leg pain is related to PVD. Other medical conditions can cause leg pain such as:  Blood clots (embolism) or Deep Vein Thrombosis.  Inflammation of the blood vessels (vasculitis).  Spinal stenosis. DIAGNOSIS  Diagnosis of PVD can involve several different types of tests. These  can include:  Pulse Volume Recording Method (PVR). This test is simple, painless and does not involve the use of X-rays. PVR involves measuring and comparing the blood pressure in the arms and legs. An ABI (Ankle-Brachial Index) is calculated. The normal ratio of blood pressures is 1. As this number becomes smaller, it indicates more severe disease.  < 0.95 - indicates significant narrowing in one or more leg vessels.  <0.8 - there will usually be pain in the foot, leg or buttock with exercise.  <0.4 - will usually have pain in the legs at rest.  <0.25 - usually indicates limb threatening PVD.  Doppler detection of pulses in the legs. This test is painless and checks to see if you have a pulses in your legs/feet.  A dye or contrast material (a substance that highlights the blood vessels so they show up on x-ray) may be given to help your caregiver better see the arteries for the following tests. The dye is eliminated from your body by the kidney's. Your caregiver may order blood work to check your kidney function and other laboratory values before the following tests are performed:  Magnetic Resonance Angiography (MRA). An MRA is a picture study of the   blood vessels and arteries. The MRA machine uses a large magnet to produce images of the blood vessels.  Computed Tomography Angiography (CTA). A CTA is a specialized x-ray that looks at how the blood flows in your blood vessels. An IV may be inserted into your arm so contrast dye can be injected.  Angiogram. Is a procedure that uses x-rays to look at your blood vessels. This procedure is minimally invasive, meaning a small incision (cut) is made in your groin. A small tube (catheter) is then inserted into the artery of your groin. The catheter is guided to the blood vessel or artery your caregiver wants to examine. Contrast dye is injected into the catheter. X-rays are then taken of the blood vessel or artery. After the images are obtained, the  catheter is taken out. TREATMENT  Treatment of PVD involves many interventions which may include:  Lifestyle changes:  Quitting smoking.  Exercise.  Following a low fat, low cholesterol diet.  Control of diabetes.  Foot care is very important to the PVD patient. Good foot care can help prevent infection.  Medication:  Cholesterol-lowering medicine.  Blood pressure medicine.  Anti-platelet drugs.  Certain medicines may reduce symptoms of Intermittent Claudication.  Interventional/Surgical options:  Angioplasty. An Angioplasty is a procedure that inflates a balloon in the blocked artery. This opens the blocked artery to improve blood flow.  Stent Implant. A wire mesh tube (stent) is placed in the artery. The stent expands and stays in place, allowing the artery to remain open.  Peripheral Bypass Surgery. This is a surgical procedure that reroutes the blood around a blocked artery to help improve blood flow. This type of procedure may be performed if Angioplasty or stent implants are not an option. SEEK IMMEDIATE MEDICAL CARE IF:   You develop pain or numbness in your arms or legs.  Your arm or leg turns cold, becomes blue in color.  You develop redness, warmth, swelling and pain in your arms or legs. MAKE SURE YOU:   Understand these instructions.  Will watch your condition.  Will get help right away if you are not doing well or get worse. Document Released: 02/26/2004 Document Revised: 04/12/2011 Document Reviewed: 01/23/2008 Exit[employer] Patient Information 2015 [employer]. This information is not intended to replace advice given to you by your health care provider. Make sure you discuss any questions you have with your health care provider.    Smoking Cessation Quitting smoking is important to your health and has many advantages. However, it is not always easy to quit since nicotine is a very addictive drug. Oftentimes, people try 3 times or more before being  able to quit. This document explains the best ways for you to prepare to quit smoking. Quitting takes hard work and a lot of effort, but you can do it. ADVANTAGES OF QUITTING SMOKING  You will live longer, feel better, and live better.  Your body will feel the impact of quitting smoking almost immediately.  Within 20 minutes, blood pressure decreases. Your pulse returns to its normal level.  After 8 hours, carbon monoxide levels in the blood return to normal. Your oxygen level increases.  After 24 hours, the chance of having a heart attack starts to decrease. Your breath, hair, and body stop smelling like smoke.  After 48 hours, damaged nerve endings begin to recover. Your sense of taste and smell improve.  After 72 hours, the body is virtually free of nicotine. Your bronchial tubes relax and breathing becomes easier.    After 2 to 12 weeks, lungs can hold more air. Exercise becomes easier and circulation improves.  The risk of having a heart attack, stroke, cancer, or lung disease is greatly reduced.  After 1 year, the risk of coronary heart disease is cut in half.  After 5 years, the risk of stroke falls to the same as a nonsmoker.  After 10 years, the risk of lung cancer is cut in half and the risk of other cancers decreases significantly.  After 15 years, the risk of coronary heart disease drops, usually to the level of a nonsmoker.  If you are pregnant, quitting smoking will improve your chances of having a healthy baby.  The people you live with, especially any children, will be healthier.  You will have extra money to spend on things other than cigarettes. QUESTIONS TO THINK ABOUT BEFORE ATTEMPTING TO QUIT You may want to talk about your answers with your health care provider.  Why do you want to quit?  If you tried to quit in the past, what helped and what did not?  What will be the most difficult situations for you after you quit? How will you plan to handle  them?  Who can help you through the tough times? Your family? Friends? A health care provider?  What pleasures do you get from smoking? What ways can you still get pleasure if you quit? Here are some questions to ask your health care provider:  How can you help me to be successful at quitting?  What medicine do you think would be best for me and how should I take it?  What should I do if I need more help?  What is smoking withdrawal like? How can I get information on withdrawal? GET READY  Set a quit date.  Change your environment by getting rid of all cigarettes, ashtrays, matches, and lighters in your home, car, or work. Do not let people smoke in your home.  Review your past attempts to quit. Think about what worked and what did not. GET SUPPORT AND ENCOURAGEMENT You have a better chance of being successful if you have help. You can get support in many ways.  Tell your family, friends, and coworkers that you are going to quit and need their support. Ask them not to smoke around you.  Get individual, group, or telephone counseling and support. Programs are available at local hospitals and health centers. Call your local health department for information about programs in your area.  Spiritual beliefs and practices may help some smokers quit.  Download a "quit meter" on your computer to keep track of quit statistics, such as how long you have gone without smoking, cigarettes not smoked, and money saved.  Get a self-help book about quitting smoking and staying off tobacco. LEARN NEW SKILLS AND BEHAVIORS  Distract yourself from urges to smoke. Talk to someone, go for a walk, or occupy your time with a task.  Change your normal routine. Take a different route to work. Drink tea instead of coffee. Eat breakfast in a different place.  Reduce your stress. Take a hot bath, exercise, or read a book.  Plan something enjoyable to do every day. Reward yourself for not  smoking.  Explore interactive web-based programs that specialize in helping you quit. GET MEDICINE AND USE IT CORRECTLY Medicines can help you stop smoking and decrease the urge to smoke. Combining medicine with the above behavioral methods and support can greatly increase your chances of successfully quitting smoking.    Nicotine replacement therapy helps deliver nicotine to your body without the negative effects and risks of smoking. Nicotine replacement therapy includes nicotine gum, lozenges, inhalers, nasal sprays, and skin patches. Some may be available over-the-counter and others require a prescription.  Antidepressant medicine helps people abstain from smoking, but how this works is unknown. This medicine is available by prescription.  Nicotinic receptor partial agonist medicine simulates the effect of nicotine in your brain. This medicine is available by prescription. Ask your health care provider for advice about which medicines to use and how to use them based on your health history. Your health care provider will tell you what side effects to look out for if you choose to be on a medicine or therapy. Carefully read the information on the package. Do not use any other product containing nicotine while using a nicotine replacement product.  RELAPSE OR DIFFICULT SITUATIONS Most relapses occur within the first 3 months after quitting. Do not be discouraged if you start smoking again. Remember, most people try several times before finally quitting. You may have symptoms of withdrawal because your body is used to nicotine. You may crave cigarettes, be irritable, feel very hungry, cough often, get headaches, or have difficulty concentrating. The withdrawal symptoms are only temporary. They are strongest when you first quit, but they will go away within 10-14 days. To reduce the chances of relapse, try to:  Avoid drinking alcohol. Drinking lowers your chances of successfully quitting.  Reduce the  amount of caffeine you consume. Once you quit smoking, the amount of caffeine in your body increases and can give you symptoms, such as a rapid heartbeat, sweating, and anxiety.  Avoid smokers because they can make you want to smoke.  Do not let weight gain distract you. Many smokers will gain weight when they quit, usually less than 10 pounds. Eat a healthy diet and stay active. You can always lose the weight gained after you quit.  Find ways to improve your mood other than smoking. FOR MORE INFORMATION  www.smokefree.gov  Document Released: 01/12/2001 Document Revised: 06/04/2013 Document Reviewed: 04/29/2011 [employer] Patient Information 2015 [employer]. This information is not intended to replace advice given to you by your health care provider. Make sure you discuss any questions you have with your health care provider.    Smoking Cessation, Tips for Success If you are ready to quit smoking, congratulations! You have chosen to help yourself be healthier. Cigarettes bring nicotine, tar, carbon monoxide, and other irritants into your body. Your lungs, heart, and blood vessels will be able to work better without these poisons. There are many different ways to quit smoking. Nicotine gum, nicotine patches, a nicotine inhaler, or nicotine nasal spray can help with physical craving. Hypnosis, support groups, and medicines help break the habit of smoking. WHAT THINGS CAN I DO TO MAKE QUITTING EASIER?  Here are some tips to help you quit for good:  Pick a date when you will quit smoking completely. Tell all of your friends and family about your plan to quit on that date.  Do not try to slowly cut down on the number of cigarettes you are smoking. Pick a quit date and quit smoking completely starting on that day.  Throw away all cigarettes.   Clean and remove all ashtrays from your home, work, and car.  On a card, write down your reasons for quitting. Carry the card with you and read it  when you get the urge to smoke.  Cleanse your body   of nicotine. Drink enough water and fluids to keep your urine clear or pale yellow. Do this after quitting to flush the nicotine from your body.  Learn to predict your moods. Do not let a bad situation be your excuse to have a cigarette. Some situations in your life might tempt you into wanting a cigarette.  Never have "just one" cigarette. It leads to wanting another and another. Remind yourself of your decision to quit.  Change habits associated with smoking. If you smoked while driving or when feeling stressed, try other activities to replace smoking. Stand up when drinking your coffee. Brush your teeth after eating. Sit in a different chair when you read the paper. Avoid alcohol while trying to quit, and try to drink fewer caffeinated beverages. Alcohol and caffeine may urge you to smoke.  Avoid foods and drinks that can trigger a desire to smoke, such as sugary or spicy foods and alcohol.  Ask people who smoke not to smoke around you.  Have something planned to do right after eating or having a cup of coffee. For example, plan to take a walk or exercise.  Try a relaxation exercise to calm you down and decrease your stress. Remember, you may be tense and nervous for the first 2 weeks after you quit, but this will pass.  Find new activities to keep your hands busy. Play with a pen, coin, or rubber band. Doodle or draw things on paper.  Brush your teeth right after eating. This will help cut down on the craving for the taste of tobacco after meals. You can also try mouthwash.   Use oral substitutes in place of cigarettes. Try using lemon drops, carrots, cinnamon sticks, or chewing gum. Keep them handy so they are available when you have the urge to smoke.  When you have the urge to smoke, try deep breathing.  Designate your home as a nonsmoking area.  If you are a heavy smoker, ask your health care provider about a prescription for  nicotine chewing gum. It can ease your withdrawal from nicotine.  Reward yourself. Set aside the cigarette money you save and buy yourself something nice.  Look for support from others. Join a support group or smoking cessation program. Ask someone at home or at work to help you with your plan to quit smoking.  Always ask yourself, "Do I need this cigarette or is this just a reflex?" Tell yourself, "Today, I choose not to smoke," or "I do not want to smoke." You are reminding yourself of your decision to quit.  Do not replace cigarette smoking with electronic cigarettes (commonly called e-cigarettes). The safety of e-cigarettes is unknown, and some may contain harmful chemicals.  If you relapse, do not give up! Plan ahead and think about what you will do the next time you get the urge to smoke. HOW WILL I FEEL WHEN I QUIT SMOKING? You may have symptoms of withdrawal because your body is used to nicotine (the addictive substance in cigarettes). You may crave cigarettes, be irritable, feel very hungry, cough often, get headaches, or have difficulty concentrating. The withdrawal symptoms are only temporary. They are strongest when you first quit but will go away within 10-14 days. When withdrawal symptoms occur, stay in control. Think about your reasons for quitting. Remind yourself that these are signs that your body is healing and getting used to being without cigarettes. Remember that withdrawal symptoms are easier to treat than the major diseases that smoking can cause.    Even after the withdrawal is over, expect periodic urges to smoke. However, these cravings are generally short lived and will go away whether you smoke or not. Do not smoke! WHAT RESOURCES ARE AVAILABLE TO HELP ME QUIT SMOKING? Your health care provider can direct you to community resources or hospitals for support, which may include:  Group support.  Education.  Hypnosis.  Therapy. Document Released: 10/17/2003 Document  Revised: 06/04/2013 Document Reviewed: 07/06/2012 [employer] Patient Information 2015 [employer]. This information is not intended to replace advice given to you by your health care provider. Make sure you discuss any questions you have with your health care provider.  

## 2014-06-07 NOTE — Progress Notes (Signed)
[facility] [place] HISTORY AND PHYSICAL   MRN : [id number]  History of Present Illness:   Kristen Bradley is a 69 y.o. female  patient of Dr. [name] who is s/p right CEA on 01/05/2012, and left CEA using patch angioplasty Vascu-Guard bovine patch 1cm x 6cm on 02/08/2012.  She also has PAD. She returns today for follow up. The patient has never had amaurosis fugax or monocular blindness. The patient has never had facial drooping or hemiplegia. The patient has mever had receptive or expressive aphasia. The patient previously had some L corner of mouth droop after her R CEA, which has since resolved. The patient continues to have some non-lifestyle limiting intermittent claudication. She is not doing a walking plan. She notes some intermittent Left lower leg numbness without other radicular sx. Pt continues to smoke.   At her visit with Dr. [name] on 01/26/2013 his assessment was the following:  Her sx pattern for her Left leg and ABI are not consistent with L PAD as the etiology for her sx. I would consider spinal evaluation with +/- EMG/NCV studies. The patient needs to stop smoking otherwise her carotid arteries are likely to restenose and her PAD will progress with time. The pt's prior left corner of mouth drooping is NOT consistent with marginal mandibular drooping as it was CONTRALATERAL to the R CEA, suggesting possible small contralateral CVA.   Pt denies any new stroke or TIA symptoms.  She denies steal symptoms in either upper extremity, denies dizziness. She complains of bilateral calf/thigh/buttocks pain in about 2 minutes with walking at times, left calf worse than than right.  She also has occassional bilateral buttocks and thigh pain with walking.  She did not get the Pletal filled from her last visit as it was too expensive with [facility]. She has no hx of CHF. Pt denies non healing wounds.   Pt Diabetic: Yes, states her last A1C was under 7.0.  Pt smoker:  smoker (1/2 ppd, started smoking at about age 78), states she stopped several times   Pt meds include:  Statin :Yes  Betablocker: Yes  ASA: Yes  Other anticoagulants/antiplatelets: Plavix     Current Outpatient Prescriptions  Medication Sig Dispense Refill  . amLODipine (NORVASC) 10 MG tablet Take 10 mg by mouth at bedtime.      Marland Kitchen aspirin EC 81 MG tablet Take 81 mg by mouth daily.      Marland Kitchen atenolol (TENORMIN) 100 MG tablet Take 100 mg by mouth every morning.      . cilostazol (PLETAL) 100 MG tablet Take 1 tablet (100 mg total) by mouth 2 (two) times daily before a meal. 180 tablet 3  . cloNIDine (CATAPRES) 0.1 MG tablet Take 0.1 mg by mouth 2 (two) times daily.      . clopidogrel (PLAVIX) 75 MG tablet Take 1 tablet (75 mg total) by mouth daily. 30 tablet 11  . gemfibrozil (LOPID) 600 MG tablet Take 600 mg by mouth 2 (two) times daily.      . hydrochlorothiazide (HYDRODIURIL) 12.5 MG tablet Take 12.5 mg by mouth daily.     Marland Kitchen losartan (COZAAR) 100 MG tablet Take 100 mg by mouth daily.     . metFORMIN (GLUCOPHAGE) 500 MG tablet Take 500 mg by mouth 2 (two) times daily with a meal.      . omeprazole (PRILOSEC) 40 MG capsule     . PRODIGY NO CODING BLOOD GLUC test strip 3 (three) times daily.     Marland Kitchen  PRODIGY TWIST TOP LANCETS 28G MISC 3 (three) times daily.     . simvastatin (ZOCOR) 40 MG tablet Take 40 mg by mouth daily.     . traMADol (ULTRAM) 50 MG tablet Take 1 tablet (50 mg total) by mouth every 6 (six) hours as needed. 30 tablet 0   No current facility-administered medications for this visit.    Past Medical History  Diagnosis Date  . High cholesterol     takes Lopid daily  . Carotid artery disease   . Tobacco abuse   . Breast cancer     s/p lumpectomy, XRT  . Renal artery stenosis     Right renal artery stent 05/31/06  . Hepatitis C   . Coronary artery disease     1 stent   . Hypertension     takes Losartan,Amlodipine,HCTZ,and Atenolol daily and Clatarpres  . Joint pain      legs  . History of UTI     takes Diflucan dail--pt states no uti in a couple of yrs though  . Diabetes mellitus     takes Metformin daily  . Stroke Jan. 7, 2014    Social History History  Substance Use Topics  . Smoking status: Former Smoker -- 0.25 packs/day for 50 years    Types: Cigarettes    Quit date: 12/29/2011  . Smokeless tobacco: Never Used     Comment: pt states she only smokes 2-3 cigs per day  . Alcohol Use: No    Family History Family History  Problem Relation Age of Onset  . Cancer Mother     ? type  . Cancer Father     ? type  . Diabetes Sister   . Hypertension Sister   . Diabetes Brother   . CAD Neg Hx     Surgical History Past Surgical History  Procedure Laterality Date  . Adrenal gland surgery    . Abdominal hysterectomy    . Breast lumpectomy      right  . Back surgery    . Coronary angioplasty  2008    1 stent  . Endarterectomy  01/05/2012    Procedure: ENDARTERECTOMY CAROTID;  Surgeon: Conrad Forest Ranch, MD;  Location: [facility];  Service: Vascular;  Laterality: Right;  Right Carotid Artery Endarterectomy, right stump pressure, intraoperative ultrasound  . Endarterectomy  02/08/2012    Procedure: ENDARTERECTOMY CAROTID;  Surgeon: Conrad Deming, MD;  Location: [facility];  Service: Vascular;  Laterality: Left;  . Patch angioplasty  02/08/2012    Procedure: PATCH ANGIOPLASTY;  Surgeon: Conrad Happy, MD;  Location: [facility] OR;  Service: Vascular;  Laterality: Left;  Carotid artery patch angioplasty using Vascu-Guard bovine patch 1cm x 6cm.  . Carotid endarterectomy    . Spine surgery      Allergies  Allergen Reactions  . Chlorhexidine Gluconate Itching  . Ace Inhibitors Cough  . Dilaudid [Hydromorphone Hcl] Itching    Current Outpatient Prescriptions  Medication Sig Dispense Refill  . amLODipine (NORVASC) 10 MG tablet Take 10 mg by mouth at bedtime.      Marland Kitchen aspirin EC 81 MG tablet Take 81 mg by mouth daily.      Marland Kitchen atenolol (TENORMIN) 100 MG tablet Take 100  mg by mouth every morning.      . cilostazol (PLETAL) 100 MG tablet Take 1 tablet (100 mg total) by mouth 2 (two) times daily before a meal. 180 tablet 3  . cloNIDine (CATAPRES) 0.1 MG tablet Take 0.1 mg by  mouth 2 (two) times daily.      . clopidogrel (PLAVIX) 75 MG tablet Take 1 tablet (75 mg total) by mouth daily. 30 tablet 11  . gemfibrozil (LOPID) 600 MG tablet Take 600 mg by mouth 2 (two) times daily.      . hydrochlorothiazide (HYDRODIURIL) 12.5 MG tablet Take 12.5 mg by mouth daily.     Marland Kitchen losartan (COZAAR) 100 MG tablet Take 100 mg by mouth daily.     . metFORMIN (GLUCOPHAGE) 500 MG tablet Take 500 mg by mouth 2 (two) times daily with a meal.      . omeprazole (PRILOSEC) 40 MG capsule     . PRODIGY NO CODING BLOOD GLUC test strip 3 (three) times daily.     Marland Kitchen PRODIGY TWIST TOP LANCETS 28G MISC 3 (three) times daily.     . simvastatin (ZOCOR) 40 MG tablet Take 40 mg by mouth daily.     . traMADol (ULTRAM) 50 MG tablet Take 1 tablet (50 mg total) by mouth every 6 (six) hours as needed. 30 tablet 0   No current facility-administered medications for this visit.     REVIEW OF SYSTEMS: See HPI for pertinent positives and negatives.  Physical Examination  Filed Vitals:   06/07/14 1046 06/07/14 1048  BP: 141/71 139/67  Pulse: 56 55  Resp:  14  Height:  5\' 5"  (1.651 m)  Weight:  135 lb (61.236 kg)  SpO2:  100%   Body mass index is 22.47 kg/(m^2).   General: WDWN in NAD Gait: Normal HENT: WNL Eyes: PERRLA Pulmonary: normal non-labored breathing , without Rales, rhonchi, or wheezing, diminished breath sounds in all fields, occasional dry cough Cardiac: RRR, no murmurs detected  Abdomen: soft, NT, no masses Skin: no rashes, ulcers noted; no Gangrene , no cellulitis; no open wounds;   VASCULAR EXAM  Carotid Bruits Left Right   Positive Positive   Radial pulses are 1+ palpable and = Aorta is not palpable.   VASCULAR  EXAM: Extremities without ischemic changes  without Gangrene; without open wounds.     LE Pulses LEFT RIGHT   FEMORAL 1+ palpable 1+ palpable    POPLITEAL not palpable  not palpable   POSTERIOR TIBIAL not palpable  not palpable    DORSALIS PEDIS  ANTERIOR TIBIAL not palpable  not palpable     Musculoskeletal: no muscle wasting or atrophy; no peripheral edema Neurologic: A&O X 3; Appropriate Affect ;  SENSATION: normal; MOTOR FUNCTION: 5/5 Symmetric, CN 2-12 intact Speech is fluent/normal          Non-Invasive Vascular Imaging (06/07/2014):  CEREBROVASCULAR DUPLEX EVALUATION    INDICATION: Carotid artery disease     PREVIOUS INTERVENTION(S): Right carotid endarterectomy 01/05/2012 Left carotid endarterectomy 02/09/2012    DUPLEX EXAM:     RIGHT  LEFT  Peak Systolic Velocities (cm/s) End Diastolic Velocities (cm/s) Plaque LOCATION Peak Systolic Velocities (cm/s) End Diastolic Velocities (cm/s) Plaque  159 10 HT CCA PROXIMAL 158 12 HT  66 7 HT CCA MID 72 13 HT  61 8 HM CCA DISTAL 65 13   73 0  ECA 72 0   41 9  ICA PROXIMAL 49 8   33 10  ICA MID 34 11   85 27  ICA DISTAL 51 14     Not calculated ICA / CCA Ratio (PSV) Not calculated  Antegrade  Vertebral Flow Not well visualized   [id number] Brachial Systolic Pressure (mmHg) [id number]  Multiphasic (Subclavian artery) Brachial Artery Waveforms Multiphasic (  Subclavian artery)    Plaque Morphology:  HM = Homogeneous, HT = Heterogeneous, CP = Calcific Plaque, SP = Smooth Plaque, IP = Irregular Plaque  ADDITIONAL FINDINGS:     IMPRESSION: Patent right and left carotid endarterectomy sites with no evidence of hyperplasia or restenosis.     Compared to the previous exam:  No significant change in comparison to the last exam.      ABI (Date: 06/07/2014)  R: 0.54 (12/17/13, 0.54), DP: monophasic, PT: monophasic, TBI: absent  L: 0.43 (0.39), DP: monophasic, PT: monophasic, TBI: absent   ASSESSMENT:  Kristen Bradley is a 69 y.o. female who is s/p right CEA on 01/05/2012, and left CEA using patch angioplasty Vascu-Guard bovine patch 1cm x 6cm on 02/08/2012.  She also has known PAD. Pletal added at her last visit to help with claudication and facilitate a graduated walking program; however, she did not get it filled as it was too expensive; pt requested printed prescription for 30 days with refills, she will take it to [facility] and hopes that her co pay is less.  Today's carotid Duplex suggests patent right and left carotid endarterectomy sites with no evidence of hyperplasia or restenosis. No significant change in comparison to the last exam.  ABI's remain stable with evidence of severe bilateral arterial occlusive disease.  She has no tissue loss, no signs of ischemia in her feet. She has no rest pain but does have claudication from buttocks to calves in both LE's, left is worse, after about 2 minutes of walking.  Her atherosclerotic risk factors include DM and continued smoking, but she does seem motivated to quit; see Plan.   PLAN:   Graduated walking program discussed in detail. The patient was counseled re smoking cessation and given several free resources re smoking cessation.  Based on today's exam and non-invasive vascular lab results, the patient will follow up in 6 months with the following tests: ABI's, 1 year for carotid Duplex. I discussed in depth with the patient the nature of atherosclerosis, and emphasized the importance of maximal medical management including strict control of blood pressure, blood glucose, and lipid levels, obtaining regular exercise, and cessation of smoking.  The patient is aware that without maximal medical management the underlying atherosclerotic disease process will progress,  limiting the benefit of any interventions.  The patient was given information about stroke prevention and what symptoms should prompt the patient to seek immediate medical care.  The patient was given information about PAD including signs, symptoms, treatment, what symptoms should prompt the patient to seek immediate medical care, and risk reduction measures to take. Thank you for allowing [place] to participate in this patient's care.  Clemon Chambers, RN, MSN, FNP-C [facility] Office: [phone]  Clinic MD: [name]  06/07/2014 10:45 AM

## 2014-07-08 ENCOUNTER — Telehealth: Payer: Self-pay

## 2014-07-08 NOTE — Telephone Encounter (Signed)
I agree with pt giving cilostezol a full month to evaluate for benefit. If she has no benefit after a month of using, she should stop taking the medication. Thank you, Vinnie Level

## 2014-07-08 NOTE — Telephone Encounter (Signed)
Phone call from pt.  Reported that she has not had any improvement in her leg pain with walking, since taking the Cilastozol 100 mg BID x 2 weeks.  Reported she is starting on her 3rd week on taking this medication, and unable to see any difference.  Is asking for an alternate medication.  Advised per the drug literature, that therapeutic effects are usually seen in 2-4 weeks.  Advised to continue to take as prescribed, and give it a little longer to see the effects.  Advised the Nurse Practitioner will be informed of pt's complaints.

## 2014-07-09 NOTE — Telephone Encounter (Signed)
Attempted to call pt.  Left voice message on pt's personal voice mail, with instructions, per Nurse Practitioner, to continue to take the [place] for a full month, to evaluate if any benefit;  if no improvement of symptoms after that time, to stop taking this medication.  Advised to call office if any questions.

## 2014-07-15 DIAGNOSIS — I6523 Occlusion and stenosis of bilateral carotid arteries: Secondary | ICD-10-CM | POA: Diagnosis not present

## 2014-07-15 DIAGNOSIS — I10 Essential (primary) hypertension: Secondary | ICD-10-CM | POA: Diagnosis not present

## 2014-07-15 DIAGNOSIS — E119 Type 2 diabetes mellitus without complications: Secondary | ICD-10-CM | POA: Diagnosis not present

## 2014-07-15 DIAGNOSIS — Z9889 Other specified postprocedural states: Secondary | ICD-10-CM | POA: Diagnosis not present

## 2014-07-15 DIAGNOSIS — D509 Iron deficiency anemia, unspecified: Secondary | ICD-10-CM | POA: Diagnosis not present

## 2014-07-15 DIAGNOSIS — I739 Peripheral vascular disease, unspecified: Secondary | ICD-10-CM | POA: Diagnosis not present

## 2014-07-15 DIAGNOSIS — E785 Hyperlipidemia, unspecified: Secondary | ICD-10-CM | POA: Diagnosis not present

## 2014-07-15 DIAGNOSIS — M8588 Other specified disorders of bone density and structure, other site: Secondary | ICD-10-CM | POA: Diagnosis not present

## 2014-07-23 DIAGNOSIS — E785 Hyperlipidemia, unspecified: Secondary | ICD-10-CM | POA: Diagnosis not present

## 2014-07-23 DIAGNOSIS — E119 Type 2 diabetes mellitus without complications: Secondary | ICD-10-CM | POA: Diagnosis not present

## 2014-07-23 DIAGNOSIS — N390 Urinary tract infection, site not specified: Secondary | ICD-10-CM | POA: Diagnosis not present

## 2014-07-23 DIAGNOSIS — B182 Chronic viral hepatitis C: Secondary | ICD-10-CM | POA: Diagnosis not present

## 2014-07-23 DIAGNOSIS — D509 Iron deficiency anemia, unspecified: Secondary | ICD-10-CM | POA: Diagnosis not present

## 2014-07-23 DIAGNOSIS — I10 Essential (primary) hypertension: Secondary | ICD-10-CM | POA: Diagnosis not present

## 2014-07-23 DIAGNOSIS — M8588 Other specified disorders of bone density and structure, other site: Secondary | ICD-10-CM | POA: Diagnosis not present

## 2014-08-09 DIAGNOSIS — E119 Type 2 diabetes mellitus without complications: Secondary | ICD-10-CM | POA: Diagnosis not present

## 2014-08-23 ENCOUNTER — Other Ambulatory Visit: Payer: Self-pay

## 2014-08-23 DIAGNOSIS — Z1231 Encounter for screening mammogram for malignant neoplasm of breast: Secondary | ICD-10-CM

## 2014-09-23 ENCOUNTER — Ambulatory Visit
Admission: RE | Admit: 2014-09-23 | Discharge: 2014-09-23 | Disposition: A | Discharge status: Home / Self Care | Payer: Commercial Managed Care - HMO | Source: Ambulatory Visit

## 2014-09-23 DIAGNOSIS — Z1231 Encounter for screening mammogram for malignant neoplasm of breast: Secondary | ICD-10-CM | POA: Diagnosis not present

## 2014-12-13 ENCOUNTER — Ambulatory Visit: Payer: Commercial Managed Care - HMO | Admitting: Family

## 2014-12-13 ENCOUNTER — Encounter (HOSPITAL_COMMUNITY): Payer: Commercial Managed Care - HMO

## 2014-12-19 DIAGNOSIS — H5210 Myopia, unspecified eye: Secondary | ICD-10-CM | POA: Diagnosis not present

## 2014-12-19 DIAGNOSIS — H5203 Hypermetropia, bilateral: Secondary | ICD-10-CM | POA: Diagnosis not present

## 2014-12-19 DIAGNOSIS — H52222 Regular astigmatism, left eye: Secondary | ICD-10-CM | POA: Diagnosis not present

## 2014-12-19 DIAGNOSIS — H25099 Other age-related incipient cataract, unspecified eye: Secondary | ICD-10-CM | POA: Diagnosis not present

## 2014-12-19 DIAGNOSIS — H18419 Arcus senilis, unspecified eye: Secondary | ICD-10-CM | POA: Diagnosis not present

## 2014-12-19 DIAGNOSIS — E119 Type 2 diabetes mellitus without complications: Secondary | ICD-10-CM | POA: Diagnosis not present

## 2014-12-19 DIAGNOSIS — H11159 Pinguecula, unspecified eye: Secondary | ICD-10-CM | POA: Diagnosis not present

## 2014-12-19 DIAGNOSIS — H524 Presbyopia: Secondary | ICD-10-CM | POA: Diagnosis not present

## 2014-12-19 DIAGNOSIS — I10 Essential (primary) hypertension: Secondary | ICD-10-CM | POA: Diagnosis not present

## 2015-01-03 ENCOUNTER — Encounter: Payer: Self-pay | Admitting: Family

## 2015-01-10 ENCOUNTER — Ambulatory Visit (HOSPITAL_COMMUNITY)
Admission: RE | Admit: 2015-01-10 | Discharge: 2015-01-10 | Disposition: A | Discharge status: Home / Self Care | Payer: Commercial Managed Care - HMO | Source: Ambulatory Visit | Attending: Family | Admitting: Family

## 2015-01-10 ENCOUNTER — Ambulatory Visit (INDEPENDENT_AMBULATORY_CARE_PROVIDER_SITE_OTHER): Payer: Commercial Managed Care - HMO | Admitting: Family

## 2015-01-10 ENCOUNTER — Encounter: Payer: Self-pay | Admitting: Family

## 2015-01-10 VITALS — BP 138/66 | HR 65 | Ht 65.0 in | Wt 125.7 lb

## 2015-01-10 DIAGNOSIS — Z720 Tobacco use: Secondary | ICD-10-CM | POA: Insufficient documentation

## 2015-01-10 DIAGNOSIS — E1151 Type 2 diabetes mellitus with diabetic peripheral angiopathy without gangrene: Secondary | ICD-10-CM | POA: Diagnosis not present

## 2015-01-10 DIAGNOSIS — Z9889 Other specified postprocedural states: Secondary | ICD-10-CM | POA: Diagnosis not present

## 2015-01-10 DIAGNOSIS — Z8679 Personal history of other diseases of the circulatory system: Secondary | ICD-10-CM | POA: Diagnosis not present

## 2015-01-10 DIAGNOSIS — I70219 Atherosclerosis of native arteries of extremities with intermittent claudication, unspecified extremity: Secondary | ICD-10-CM | POA: Diagnosis present

## 2015-01-10 DIAGNOSIS — I739 Peripheral vascular disease, unspecified: Secondary | ICD-10-CM | POA: Diagnosis not present

## 2015-01-10 DIAGNOSIS — F17200 Nicotine dependence, unspecified, uncomplicated: Secondary | ICD-10-CM

## 2015-01-10 NOTE — Patient Instructions (Signed)
Stroke Prevention Some medical conditions and behaviors are associated with an increased chance of having a stroke. You may prevent a stroke by making healthy choices and managing medical conditions. HOW CAN I REDUCE MY RISK OF HAVING A STROKE?   Stay physically active. Get at least 30 minutes of activity on most or all days.  Do not smoke. It may also be helpful to avoid exposure to secondhand smoke.  Limit alcohol use. Moderate alcohol use is considered to be:  No more than 2 drinks per day for men.  No more than 1 drink per day for nonpregnant women.  Eat healthy foods. This involves:  Eating 5 or more servings of fruits and vegetables a day.  Making dietary changes that address high blood pressure (hypertension), high cholesterol, diabetes, or obesity.  Manage your cholesterol levels.  Making food choices that are high in fiber and low in saturated fat, trans fat, and cholesterol may control cholesterol levels.  Take any prescribed medicines to control cholesterol as directed by your health care provider.  Manage your diabetes.  Controlling your carbohydrate and sugar intake is recommended to manage diabetes.  Take any prescribed medicines to control diabetes as directed by your health care provider.  Control your hypertension.  Making food choices that are low in salt (sodium), saturated fat, trans fat, and cholesterol is recommended to manage hypertension.  Ask your health care provider if you need treatment to lower your blood pressure. Take any prescribed medicines to control hypertension as directed by your health care provider.  If you are 18-39 years of age, have your blood pressure checked every 3-5 years. If you are 40 years of age or older, have your blood pressure checked every year.  Maintain a healthy weight.  Reducing calorie intake and making food choices that are low in sodium, saturated fat, trans fat, and cholesterol are recommended to manage  weight.  Stop drug abuse.  Avoid taking birth control pills.  Talk to your health care provider about the risks of taking birth control pills if you are over 35 years old, smoke, get migraines, or have ever had a blood clot.  Get evaluated for sleep disorders (sleep apnea).  Talk to your health care provider about getting a sleep evaluation if you snore a lot or have excessive sleepiness.  Take medicines only as directed by your health care provider.  For some people, aspirin or blood thinners (anticoagulants) are helpful in reducing the risk of forming abnormal blood clots that can lead to stroke. If you have the irregular heart rhythm of atrial fibrillation, you should be on a blood thinner unless there is a good reason you cannot take them.  Understand all your medicine instructions.  Make sure that other conditions (such as anemia or atherosclerosis) are addressed. SEEK IMMEDIATE MEDICAL CARE IF:   You have sudden weakness or numbness of the face, arm, or leg, especially on one side of the body.  Your face or eyelid droops to one side.  You have sudden confusion.  You have trouble speaking (aphasia) or understanding.  You have sudden trouble seeing in one or both eyes.  You have sudden trouble walking.  You have dizziness.  You have a loss of balance or coordination.  You have a sudden, severe headache with no known cause.  You have new chest pain or an irregular heartbeat. Any of these symptoms may represent a serious problem that is an emergency. Do not wait to see if the symptoms will   go away. Get medical help at once. Call your local emergency services (911 in [place] Do not drive yourself to the hospital.   This information is not intended to replace advice given to you by your health care provider. Make sure you discuss any questions you have with your health care provider.   Document Released: 02/26/2004 Document Revised: 02/08/2014 Document Reviewed:  07/21/2012 Elsevier Interactive Patient Education 2016 [employer]    Peripheral Vascular Disease Peripheral vascular disease (PVD) is a disease of the blood vessels that are not part of your heart and brain. A simple term for PVD is poor circulation. In most cases, PVD narrows the blood vessels that carry blood from your heart to the rest of your body. This can result in a decreased supply of blood to your arms, legs, and internal organs, like your stomach or kidneys. However, it most often affects a person's lower legs and feet. There are two types of PVD.  Organic PVD. This is the more common type. It is caused by damage to the structure of blood vessels.  Functional PVD. This is caused by conditions that make blood vessels contract and tighten (spasm). Without treatment, PVD tends to get worse over time. PVD can also lead to acute ischemic limb. This is when an arm or limb suddenly has trouble getting enough blood. This is a medical emergency. CAUSES Each type of PVD has many different causes. The most common cause of PVD is buildup of a fatty material (plaque) inside of your arteries (atherosclerosis). Small amounts of plaque can break off from the walls of the blood vessels and become lodged in a smaller artery. This blocks blood flow and can cause acute ischemic limb. Other common causes of PVD include:  Blood clots that form inside of blood vessels.  Injuries to blood vessels.  Diseases that cause inflammation of blood vessels or cause blood vessel spasms.  Health behaviors and health history that increase your risk of developing PVD. RISK FACTORS  You may have a greater risk of PVD if you:  Have a family history of PVD.  Have certain medical conditions, including:  High cholesterol.  Diabetes.  High blood pressure (hypertension).  Coronary heart disease.  Past problems with blood clots.  Past injury, such as burns or a broken bone. These may have damaged blood  vessels in your limbs.  Buerger disease. This is caused by inflamed blood vessels in your hands and feet.  Some forms of arthritis.  Rare birth defects that affect the arteries in your legs.  Use tobacco.  Do not get enough exercise.  Are obese.  Are age 50 or older. SIGNS AND SYMPTOMS  PVD may cause many different symptoms. Your symptoms depend on what part of your body is not getting enough blood. Some common signs and symptoms include:  Cramps in your lower legs. This may be a symptom of poor leg circulation (claudication).  Pain and weakness in your legs while you are physically active that goes away when you rest (intermittent claudication).  Leg pain when at rest.  Leg numbness, tingling, or weakness.  Coldness in a leg or foot, especially when compared with the other leg.  Skin or hair changes. These can include:  Hair loss.  Shiny skin.  Pale or bluish skin.  Thick toenails.  Inability to get or maintain an erection (erectile dysfunction). People with PVD are more prone to developing ulcers and sores on their toes, feet, or legs. These may take longer than   normal to heal. DIAGNOSIS Your health care provider may diagnose PVD from your signs and symptoms. The health care provider will also do a physical exam. You may have tests to find out what is causing your PVD and determine its severity. Tests may include:  Blood pressure recordings from your arms and legs and measurements of the strength of your pulses (pulse volume recordings).  Imaging studies using sound waves to take pictures of the blood flow through your blood vessels (Doppler ultrasound).  Injecting a dye into your blood vessels before having imaging studies using:  X-rays (angiogram or arteriogram).  Computer-generated X-rays (CT angiogram).  A powerful electromagnetic field and a computer (magnetic resonance angiogram or MRA). TREATMENT Treatment for PVD depends on the cause of your condition  and the severity of your symptoms. It also depends on your age. Underlying causes need to be treated and controlled. These include long-lasting (chronic) conditions, such as diabetes, high cholesterol, and high blood pressure. You may need to first try making lifestyle changes and taking medicines. Surgery may be needed if these do not work. Lifestyle changes may include:  Quitting smoking.  Exercising regularly.  Following a low-fat, low-cholesterol diet. Medicines may include:  Blood thinners to prevent blood clots.  Medicines to improve blood flow.  Medicines to improve your blood cholesterol levels. Surgical procedures may include:  A procedure that uses an inflated balloon to open a blocked artery and improve blood flow (angioplasty).  A procedure to put in a tube (stent) to keep a blocked artery open (stent implant).  Surgery to reroute blood flow around a blocked artery (peripheral bypass surgery).  Surgery to remove dead tissue from an infected wound on the affected limb.  Amputation. This is surgical removal of the affected limb. This may be necessary in cases of acute ischemic limb that are not improved through medical or surgical treatments. HOME CARE INSTRUCTIONS  Take medicines only as directed by your health care provider.  Do not use any tobacco products, including cigarettes, chewing tobacco, or electronic cigarettes. If you need help quitting, ask your health care provider.  Lose weight if you are overweight, and maintain a healthy weight as directed by your health care provider.  Eat a diet that is low in fat and cholesterol. If you need help, ask your health care provider.  Exercise regularly. Ask your health care provider to suggest some good activities for you.  Use compression stockings or other mechanical devices as directed by your health care provider.  Take good care of your feet.  Wear comfortable shoes that fit well.  Check your feet often for  any cuts or sores. SEEK MEDICAL CARE IF:  You have cramps in your legs while walking.  You have leg pain when you are at rest.  You have coldness in a leg or foot.  Your skin changes.  You have erectile dysfunction.  You have cuts or sores on your feet that are not healing. SEEK IMMEDIATE MEDICAL CARE IF:  Your arm or leg turns cold and blue.  Your arms or legs become red, warm, swollen, painful, or numb.  You have chest pain or trouble breathing.  You suddenly have weakness in your face, arm, or leg.  You become very confused or lose the ability to speak.  You suddenly have a very bad headache or lose your vision.   This information is not intended to replace advice given to you by your health care provider. Make sure you discuss any questions   you have with your health care provider.   Document Released: 02/26/2004 Document Revised: 02/08/2014 Document Reviewed: 06/28/2013 Elsevier Interactive Patient Education 2016 [employer]     Steps to Quit Smoking  Smoking tobacco can be harmful to your health and can affect almost every organ in your body. Smoking puts you, and those around you, at risk for developing many serious chronic diseases. Quitting smoking is difficult, but it is one of the best things that you can do for your health. It is never too late to quit. WHAT ARE THE BENEFITS OF QUITTING SMOKING? When you quit smoking, you lower your risk of developing serious diseases and conditions, such as:  Lung cancer or lung disease, such as COPD.  Heart disease.  Stroke.  Heart attack.  Infertility.  Osteoporosis and bone fractures. Additionally, symptoms such as coughing, wheezing, and shortness of breath may get better when you quit. You may also find that you get sick less often because your body is stronger at fighting off colds and infections. If you are pregnant, quitting smoking can help to reduce your chances of having a baby of low birth weight. HOW DO  I GET READY TO QUIT? When you decide to quit smoking, create a plan to make sure that you are successful. Before you quit:  Pick a date to quit. Set a date within the next two weeks to give you time to prepare.  Write down the reasons why you are quitting. Keep this list in places where you will see it often, such as on your bathroom mirror or in your car or wallet.  Identify the people, places, things, and activities that make you want to smoke (triggers) and avoid them. Make sure to take these actions:  Throw away all cigarettes at home, at work, and in your car.  Throw away smoking accessories, such as ashtrays and lighters.  Clean your car and make sure to empty the ashtray.  Clean your home, including curtains and carpets.  Tell your family, friends, and coworkers that you are quitting. Support from your loved ones can make quitting easier.  Talk with your health care provider about your options for quitting smoking.  Find out what treatment options are covered by your health insurance. WHAT STRATEGIES CAN I USE TO QUIT SMOKING?  Talk with your healthcare provider about different strategies to quit smoking. Some strategies include:  Quitting smoking altogether instead of gradually lessening how much you smoke over a period of time. Research shows that quitting "cold turkey" is more successful than gradually quitting.  Attending in-person counseling to help you build problem-solving skills. You are more likely to have success in quitting if you attend several counseling sessions. Even short sessions of 10 minutes can be effective.  Finding resources and support systems that can help you to quit smoking and remain smoke-free after you quit. These resources are most helpful when you use them often. They can include:  Online chats with a counselor.  Telephone quitlines.  Printed self-help materials.  Support groups or group counseling.  Text messaging programs.  Mobile phone  applications.  Taking medicines to help you quit smoking. (If you are pregnant or breastfeeding, talk with your health care provider first.) Some medicines contain nicotine and some do not. Both types of medicines help with cravings, but the medicines that include nicotine help to relieve withdrawal symptoms. Your health care provider may recommend:  Nicotine patches, gum, or lozenges.  Nicotine inhalers or sprays.  Non-nicotine medicine that   is taken by mouth. Talk with your health care provider about combining strategies, such as taking medicines while you are also receiving in-person counseling. Using these two strategies together makes you more likely to succeed in quitting than if you used either strategy on its own. If you are pregnant or breastfeeding, talk with your health care provider about finding counseling or other support strategies to quit smoking. Do not take medicine to help you quit smoking unless told to do so by your health care provider. WHAT THINGS CAN I DO TO MAKE IT EASIER TO QUIT? Quitting smoking might feel overwhelming at first, but there is a lot that you can do to make it easier. Take these important actions:  Reach out to your family and friends and ask that they support and encourage you during this time. Call telephone quitlines, reach out to support groups, or work with a counselor for support.  Ask people who smoke to avoid smoking around you.  Avoid places that trigger you to smoke, such as bars, parties, or smoke-break areas at work.  Spend time around people who do not smoke.  Lessen stress in your life, because stress can be a smoking trigger for some people. To lessen stress, try:  Exercising regularly.  Deep-breathing exercises.  Yoga.  Meditating.  Performing a body scan. This involves closing your eyes, scanning your body from head to toe, and noticing which parts of your body are particularly tense. Purposefully relax the muscles in those  areas.  Download or purchase mobile phone or tablet apps (applications) that can help you stick to your quit plan by providing reminders, tips, and encouragement. There are many free apps, such as QuitGuide from the CDC (Centers for Disease Control and Prevention). You can find other support for quitting smoking (smoking cessation) through smokefree.gov and other websites. HOW WILL I FEEL WHEN I QUIT SMOKING? Within the first 24 hours of quitting smoking, you may start to feel some withdrawal symptoms. These symptoms are usually most noticeable 2-3 days after quitting, but they usually do not last beyond 2-3 weeks. Changes or symptoms that you might experience include:  Mood swings.  Restlessness, anxiety, or irritation.  Difficulty concentrating.  Dizziness.  Strong cravings for sugary foods in addition to nicotine.  Mild weight gain.  Constipation.  Nausea.  Coughing or a sore throat.  Changes in how your medicines work in your body.  A depressed mood.  Difficulty sleeping (insomnia). After the first 2-3 weeks of quitting, you may start to notice more positive results, such as:  Improved sense of smell and taste.  Decreased coughing and sore throat.  Slower heart rate.  Lower blood pressure.  Clearer skin.  The ability to breathe more easily.  Fewer sick days. Quitting smoking is very challenging for most people. Do not get discouraged if you are not successful the first time. Some people need to make many attempts to quit before they achieve long-term success. Do your best to stick to your quit plan, and talk with your health care provider if you have any questions or concerns.   This information is not intended to replace advice given to you by your health care provider. Make sure you discuss any questions you have with your health care provider.   Document Released: 01/12/2001 Document Revised: 06/04/2014 Document Reviewed: 06/04/2014 Elsevier Interactive Patient  Education 2016 [employer]  

## 2015-01-10 NOTE — Progress Notes (Signed)
Kristen Bradley HISTORY AND PHYSICAL -PAD  History of Present Illness Kristen Bradley is a 69 y.o. female patient of Dr. [name] who is s/p right CEA on 01/05/2012, and left CEA using patch angioplasty Vascu-Guard bovine patch 1cm x 6cm on 02/08/2012.  She also has PAD. She returns today for follow up. The patient has no hx of amaurosis fugax or monocular blindness,  Hemiplegia, or receptive or expressive aphasia. The patient previously had some L corner of mouth droop after her R CEA, which has since resolved. The patient continues to have some non-lifestyle limiting intermittent claudication. She is not doing a walking plan. She notes some intermittent Left lower leg numbness without other radicular sx. Pt continues to smoke.   At her visit with Dr. [name] on 01/26/2013 his assessment was the following:  Her sx pattern for her Left leg and ABI are not consistent with L PAD as the etiology for her sx. I would consider spinal evaluation with +/- EMG/NCV studies. The patient needs to stop smoking otherwise her carotid arteries are likely to restenose and her PAD will progress with time. The pt's prior left corner of mouth drooping is NOT consistent with marginal mandibular drooping as it was CONTRALATERAL to the R CEA, suggesting possible small contralateral CVA.   Pt denies any new stroke or TIA symptoms.  She denies steal symptoms in either upper extremity, denies dizziness. She complains of bilateral calf/thigh/buttocks pain in about 2 minutes with walking at times, left calf worse than than right.   Pletal helps her claudication.  She has no hx of CHF. Pt denies non healing wounds.   Pt Diabetic: Yes, states her last A1C was under 7.0.  Pt smoker: smoker (1/3 ppd, started smoking at about age 57), states she stopped several times   Pt meds include:  Statin :Yes  Betablocker: Yes  ASA: Yes  Other anticoagulants/antiplatelets: Plavix, Pletal    Past Medical  History  Diagnosis Date  . High cholesterol     takes Lopid daily  . Carotid artery disease ([place])   . Tobacco abuse   . Breast cancer ([place])     s/p lumpectomy, XRT  . Renal artery stenosis ([facility])     Right renal artery stent 05/31/06  . Hepatitis C   . Coronary artery disease     1 stent   . Hypertension     takes Losartan,Amlodipine,HCTZ,and Atenolol daily and Clatarpres  . Joint pain     legs  . History of UTI     takes Diflucan dail--pt states no uti in a couple of yrs though  . Diabetes mellitus     takes Metformin daily  . Stroke [facility]) Jan. 7, 2014    Social History Social History  Substance Use Topics  . Smoking status: Former Smoker -- 0.25 packs/day for 50 years    Types: Cigarettes    Quit date: 12/29/2011  . Smokeless tobacco: Never Used     Comment: pt states she only smokes 2-3 cigs per day  . Alcohol Use: No    Family History Family History  Problem Relation Age of Onset  . Cancer Mother     ? type  . Cancer Father     ? type  . Diabetes Sister   . Hypertension Sister   . Diabetes Brother   . Hypertension Brother   . CAD Neg Hx     Past Surgical History  Procedure Laterality Date  . Adrenal gland  surgery    . Abdominal hysterectomy    . Breast lumpectomy      right  . Back surgery    . Coronary angioplasty  2008    1 stent  . Endarterectomy  01/05/2012    Procedure: ENDARTERECTOMY CAROTID;  Surgeon: Conrad [place], MD;  Location: [place];  Service: Kristen;  Laterality: Right;  Right Carotid Artery Endarterectomy, right stump pressure, intraoperative ultrasound  . Endarterectomy  02/08/2012    Procedure: ENDARTERECTOMY CAROTID;  Surgeon: Conrad [place], MD;  Location: [place];  Service: Kristen;  Laterality: Left;  . Patch angioplasty  02/08/2012    Procedure: PATCH ANGIOPLASTY;  Surgeon: Conrad [place], MD;  Location: [facility] OR;  Service: Kristen;  Laterality: Left;  Carotid artery patch angioplasty using Vascu-Guard bovine patch 1cm x 6cm.  . Carotid  endarterectomy    . Spine surgery      Allergies  Allergen Reactions  . Chlorhexidine Gluconate Itching  . Ace Inhibitors Cough  . Dilaudid [Hydromorphone Hcl] Itching    Current Outpatient Prescriptions  Medication Sig Dispense Refill  . amLODipine (NORVASC) 10 MG tablet Take 10 mg by mouth at bedtime.      Marland Kitchen aspirin EC 81 MG tablet Take 81 mg by mouth daily.      Marland Kitchen atenolol (TENORMIN) 100 MG tablet Take 100 mg by mouth every morning.      . cilostazol (PLETAL) 100 MG tablet Take 1 tablet (100 mg total) by mouth 2 (two) times daily before a meal. 60 tablet 11  . cloNIDine (CATAPRES) 0.1 MG tablet Take 0.1 mg by mouth 2 (two) times daily.      . clopidogrel (PLAVIX) 75 MG tablet Take 1 tablet (75 mg total) by mouth daily. 30 tablet 11  . gemfibrozil (LOPID) 600 MG tablet Take 600 mg by mouth 2 (two) times daily.      . hydrochlorothiazide (HYDRODIURIL) 12.5 MG tablet Take 12.5 mg by mouth daily.     Marland Kitchen losartan (COZAAR) 100 MG tablet Take 100 mg by mouth daily.     . metFORMIN (GLUCOPHAGE) 500 MG tablet Take 500 mg by mouth 2 (two) times daily with a meal.      . omeprazole (PRILOSEC) 40 MG capsule     . PRODIGY NO CODING BLOOD GLUC test strip 3 (three) times daily.     Marland Kitchen PRODIGY TWIST TOP LANCETS 28G MISC 3 (three) times daily.     . simvastatin (ZOCOR) 40 MG tablet Take 40 mg by mouth daily.     . traMADol (ULTRAM) 50 MG tablet Take 1 tablet (50 mg total) by mouth every 6 (six) hours as needed. 30 tablet 0   No current facility-administered medications for this visit.    ROS: See HPI for pertinent positives and negatives.   Physical Examination  Filed Vitals:   01/10/15 1146  BP: 138/66  Pulse: 65  Height: 5\' 5"  (1.651 m)  Weight: 125 lb 11.2 oz (57.017 kg)  SpO2: 99%   Body mass index is 20.92 kg/(m^2).  General: WDWN in NAD Gait: Normal HENT: WNL Eyes: PERRLA Pulmonary: normal non-labored breathing, no rales, rhonchi, or wheezing, diminished breath sounds in all  fields, occasional dry cough Cardiac: RRR, no murmurs detected  Abdomen: soft, NT, no palpable masses Skin: no rashes, no ulcers, no cellulitis.  Kristen EXAM  Carotid Bruits Left Right   Positive Positive   Radial pulses are 1+ palpable and = Aorta is not palpable.   Kristen EXAM:  Extremities without ischemic changes  without Gangrene; without open wounds.     LE Pulses LEFT RIGHT   FEMORAL 1+ palpable 1+ palpable    POPLITEAL not palpable  not palpable   POSTERIOR TIBIAL not palpable  not palpable    DORSALIS PEDIS  ANTERIOR TIBIAL not palpable  not palpable     Musculoskeletal: no muscle wasting or atrophy; no peripheral edema Neurologic: A&O X 3; Appropriate Affect ;  SENSATION: normal; MOTOR FUNCTION: 5/5 Symmetric, CN 2-12 intact Speech is fluent/normal                  Non-Invasive [facility] Imaging: DATE: 01/10/2015 ABI: RIGHT: 0.49 (0.54, 06/07/14), Waveforms: monophasic, TBI: absent;  LEFT: 0.38 (0.43), Waveforms: monophasic, TBI: absent   ASSESSMENT: Kristen Bradley is a 69 y.o. female who is s/p right CEA on 01/05/2012, and left CEA using patch angioplasty Vascu-Guard bovine patch 1cm x 6cm on 02/08/2012.  She also has PAD. She has moderate claudication, no signs of ischemia in her feet/legs. Carotid duplex from May of 2016 suggests patent right and left carotid endarterectomy sites with no evidence of hyperplasia or restenosis. No significant change in comparison to the last exam. ABI's are stable with bilateral severe arterial occlusive disease, all monophasic waveforms.   Her atherosclerotic risk factors include controlled DM and continued smoking.       PLAN:  The patient was counseled  re smoking cessation and given several free resources re smoking cessation. Graduated walking program discussed in detail.  Based on the patient's Kristen studies and examination, pt will return to clinic in 6 months with ABI's and carotid duplex. She knows to return sooner if needed.  I discussed in depth with the patient the nature of atherosclerosis, and emphasized the importance of maximal medical management including strict control of blood pressure, blood glucose, and lipid levels, obtaining regular exercise, and cessation of smoking.  The patient is aware that without maximal medical management the underlying atherosclerotic disease process will progress, limiting the benefit of any interventions.  The patient was given information about PAD including signs, symptoms, treatment, what symptoms should prompt the patient to seek immediate medical care, and risk reduction measures to take.  Clemon Chambers, RN, MSN, FNP-C [facility] [employer] Phone: [phone]  Clinic MD: [name]  01/10/2015 12:10 PM

## 2015-01-13 DIAGNOSIS — E785 Hyperlipidemia, unspecified: Secondary | ICD-10-CM | POA: Diagnosis not present

## 2015-01-13 DIAGNOSIS — M8588 Other specified disorders of bone density and structure, other site: Secondary | ICD-10-CM | POA: Diagnosis not present

## 2015-01-13 DIAGNOSIS — D509 Iron deficiency anemia, unspecified: Secondary | ICD-10-CM | POA: Diagnosis not present

## 2015-01-13 DIAGNOSIS — B182 Chronic viral hepatitis C: Secondary | ICD-10-CM | POA: Diagnosis not present

## 2015-01-13 DIAGNOSIS — I739 Peripheral vascular disease, unspecified: Secondary | ICD-10-CM | POA: Diagnosis not present

## 2015-01-13 DIAGNOSIS — E559 Vitamin D deficiency, unspecified: Secondary | ICD-10-CM | POA: Diagnosis not present

## 2015-01-13 DIAGNOSIS — E119 Type 2 diabetes mellitus without complications: Secondary | ICD-10-CM | POA: Diagnosis not present

## 2015-01-13 DIAGNOSIS — I10 Essential (primary) hypertension: Secondary | ICD-10-CM | POA: Diagnosis not present

## 2015-05-06 DIAGNOSIS — H1132 Conjunctival hemorrhage, left eye: Secondary | ICD-10-CM | POA: Diagnosis not present

## 2015-05-06 DIAGNOSIS — H25093 Other age-related incipient cataract, bilateral: Secondary | ICD-10-CM | POA: Diagnosis not present

## 2015-06-13 ENCOUNTER — Encounter (HOSPITAL_COMMUNITY): Payer: Commercial Managed Care - HMO

## 2015-06-13 ENCOUNTER — Ambulatory Visit: Payer: Commercial Managed Care - HMO | Admitting: Family

## 2015-07-04 ENCOUNTER — Encounter: Payer: Self-pay | Admitting: Family

## 2015-07-11 ENCOUNTER — Ambulatory Visit: Payer: Commercial Managed Care - HMO | Admitting: Family

## 2015-07-11 ENCOUNTER — Ambulatory Visit (HOSPITAL_COMMUNITY): Payer: Commercial Managed Care - HMO

## 2015-08-13 DIAGNOSIS — E785 Hyperlipidemia, unspecified: Secondary | ICD-10-CM | POA: Diagnosis not present

## 2015-08-13 DIAGNOSIS — Z7984 Long term (current) use of oral hypoglycemic drugs: Secondary | ICD-10-CM | POA: Diagnosis not present

## 2015-08-13 DIAGNOSIS — I10 Essential (primary) hypertension: Secondary | ICD-10-CM | POA: Diagnosis not present

## 2015-08-13 DIAGNOSIS — D509 Iron deficiency anemia, unspecified: Secondary | ICD-10-CM | POA: Diagnosis not present

## 2015-08-13 DIAGNOSIS — E559 Vitamin D deficiency, unspecified: Secondary | ICD-10-CM | POA: Diagnosis not present

## 2015-08-13 DIAGNOSIS — E119 Type 2 diabetes mellitus without complications: Secondary | ICD-10-CM | POA: Diagnosis not present

## 2015-08-13 DIAGNOSIS — I739 Peripheral vascular disease, unspecified: Secondary | ICD-10-CM | POA: Diagnosis not present

## 2015-08-13 DIAGNOSIS — I6523 Occlusion and stenosis of bilateral carotid arteries: Secondary | ICD-10-CM | POA: Diagnosis not present

## 2015-08-13 DIAGNOSIS — B182 Chronic viral hepatitis C: Secondary | ICD-10-CM | POA: Diagnosis not present

## 2015-08-13 DIAGNOSIS — Z7901 Long term (current) use of anticoagulants: Secondary | ICD-10-CM | POA: Diagnosis not present

## 2015-08-18 ENCOUNTER — Other Ambulatory Visit: Payer: Self-pay | Admitting: Family Medicine

## 2015-08-18 DIAGNOSIS — Z1231 Encounter for screening mammogram for malignant neoplasm of breast: Secondary | ICD-10-CM

## 2015-09-16 ENCOUNTER — Encounter: Payer: Self-pay | Admitting: Family

## 2015-09-19 ENCOUNTER — Ambulatory Visit (INDEPENDENT_AMBULATORY_CARE_PROVIDER_SITE_OTHER)
Admission: RE | Admit: 2015-09-19 | Discharge: 2015-09-19 | Disposition: A | Discharge status: Home / Self Care | Payer: Commercial Managed Care - HMO | Source: Ambulatory Visit | Attending: Family | Admitting: Family

## 2015-09-19 ENCOUNTER — Other Ambulatory Visit: Payer: Self-pay | Admitting: *Deleted

## 2015-09-19 ENCOUNTER — Encounter: Payer: Self-pay | Admitting: Family

## 2015-09-19 ENCOUNTER — Ambulatory Visit (HOSPITAL_COMMUNITY)
Admission: RE | Admit: 2015-09-19 | Discharge: 2015-09-19 | Disposition: A | Discharge status: Home / Self Care | Payer: Commercial Managed Care - HMO | Source: Ambulatory Visit | Attending: Family | Admitting: Family

## 2015-09-19 ENCOUNTER — Ambulatory Visit (INDEPENDENT_AMBULATORY_CARE_PROVIDER_SITE_OTHER): Payer: Commercial Managed Care - HMO | Admitting: Family

## 2015-09-19 VITALS — BP 160/74 | HR 57 | Temp 98.3°F | Resp 16 | Ht 65.0 in | Wt 125.0 lb

## 2015-09-19 DIAGNOSIS — F17200 Nicotine dependence, unspecified, uncomplicated: Secondary | ICD-10-CM

## 2015-09-19 DIAGNOSIS — I6523 Occlusion and stenosis of bilateral carotid arteries: Secondary | ICD-10-CM | POA: Diagnosis not present

## 2015-09-19 DIAGNOSIS — I739 Peripheral vascular disease, unspecified: Secondary | ICD-10-CM | POA: Diagnosis not present

## 2015-09-19 DIAGNOSIS — Z720 Tobacco use: Secondary | ICD-10-CM

## 2015-09-19 DIAGNOSIS — Z9889 Other specified postprocedural states: Secondary | ICD-10-CM

## 2015-09-19 DIAGNOSIS — I10 Essential (primary) hypertension: Secondary | ICD-10-CM | POA: Insufficient documentation

## 2015-09-19 DIAGNOSIS — Z8679 Personal history of other diseases of the circulatory system: Secondary | ICD-10-CM

## 2015-09-19 DIAGNOSIS — R938 Abnormal findings on diagnostic imaging of other specified body structures: Secondary | ICD-10-CM | POA: Insufficient documentation

## 2015-09-19 DIAGNOSIS — I2510 Atherosclerotic heart disease of native coronary artery without angina pectoris: Secondary | ICD-10-CM | POA: Insufficient documentation

## 2015-09-19 DIAGNOSIS — E119 Type 2 diabetes mellitus without complications: Secondary | ICD-10-CM | POA: Insufficient documentation

## 2015-09-19 DIAGNOSIS — I70213 Atherosclerosis of native arteries of extremities with intermittent claudication, bilateral legs: Secondary | ICD-10-CM | POA: Diagnosis not present

## 2015-09-19 DIAGNOSIS — E1151 Type 2 diabetes mellitus with diabetic peripheral angiopathy without gangrene: Secondary | ICD-10-CM

## 2015-09-19 DIAGNOSIS — E7800 Pure hypercholesterolemia, unspecified: Secondary | ICD-10-CM | POA: Diagnosis not present

## 2015-09-19 DIAGNOSIS — R0989 Other specified symptoms and signs involving the circulatory and respiratory systems: Secondary | ICD-10-CM | POA: Diagnosis present

## 2015-09-19 LAB — VAS US CAROTID
LEFT ECA DIAS: 0 cm/s
LICADSYS: -47 cm/s
LICAPDIAS: -12 cm/s
LICAPSYS: -36 cm/s
Left CCA dist dias: -7 cm/s
Left CCA dist sys: -58 cm/s
Left CCA prox dias: -12 cm/s
Left CCA prox sys: -106 cm/s
Left ICA dist dias: -12 cm/s
RCCAPDIAS: 0 cm/s
RIGHT CCA MID DIAS: 11 cm/s
RIGHT ECA DIAS: -1 cm/s
Right CCA prox sys: -174 cm/s
Right cca dist sys: -90 cm/s

## 2015-09-19 MED ORDER — CILOSTAZOL 100 MG PO TABS: 100.0000 mg | ORAL_TABLET | Freq: Two times a day (BID) | ORAL | 11 refills | Status: DC

## 2015-09-19 NOTE — Progress Notes (Signed)
[facility] [place] HISTORY AND PHYSICAL   MRN : [id number]  History of Present Illness:   Kristen Bradley is a 70 y.o. female patient of Dr. [name] who is s/p right CEA on 01/05/2012, and left CEA using patch angioplasty Vascu-Guard bovine patch 1cm x 6cm on 02/08/2012.  She also has PAD. She returns today for follow up. The patient has no hx of amaurosis fugax or monocular blindness, hemiplegia, or receptive or expressive aphasia. The patient previously had some L corner of mouth droop after her R CEA, which has since resolved. The patient continues to have some non-lifestyle limiting intermittent claudication. She is not doing a walking plan. She notes some intermittent Left lower leg numbness without other radicular sx. Pt continues to smoke.   At her visit with Dr. [name] on 01/26/2013 his assessment was the following:  Her sx pattern for her Left leg and ABI are not consistent with L PAD as the etiology for her sx. I would consider spinal evaluation with +/- EMG/NCV studies. The patient needs to stop smoking otherwise her carotid arteries are likely to restenose and her PAD will progress with time. The pt's prior left corner of mouth drooping is NOT consistent with marginal mandibular drooping as it was CONTRALATERAL to the R CEA, suggesting possible small contralateral CVA.   Pt denies any new stroke or TIA symptoms.  She denies steal symptoms in either upper extremity, denies dizziness. She complains of bilateral calf/thigh/buttocks pain in about 2 minutes with walking at times, left calf worse than than right.   Pletal helps her claudication.  She has no hx of CHF. Pt denies non healing wounds.   Pt Diabetic: Yes, states her last A1C was under 7.0.  Pt smoker: smoker (1/3 ppd, started smoking at about age 17), states she stopped several times   Pt meds include:  Statin :Yes  Betablocker: Yes  ASA: Yes  Other anticoagulants/antiplatelets: Plavix,  Pletal    Current Outpatient Prescriptions  Medication Sig Dispense Refill  . amLODipine (NORVASC) 10 MG tablet Take 10 mg by mouth at bedtime.      Marland Kitchen aspirin EC 81 MG tablet Take 81 mg by mouth daily.      Marland Kitchen atenolol (TENORMIN) 100 MG tablet Take 100 mg by mouth every morning.      . cilostazol (PLETAL) 100 MG tablet Take 1 tablet (100 mg total) by mouth 2 (two) times daily before a meal. 60 tablet 11  . cloNIDine (CATAPRES) 0.1 MG tablet Take 0.1 mg by mouth 2 (two) times daily.      . clopidogrel (PLAVIX) 75 MG tablet Take 1 tablet (75 mg total) by mouth daily. 30 tablet 11  . gemfibrozil (LOPID) 600 MG tablet Take 600 mg by mouth 2 (two) times daily.      . hydrochlorothiazide (HYDRODIURIL) 12.5 MG tablet Take 12.5 mg by mouth daily.     Marland Kitchen losartan (COZAAR) 100 MG tablet Take 100 mg by mouth daily.     . metFORMIN (GLUCOPHAGE) 500 MG tablet Take 500 mg by mouth 2 (two) times daily with a meal.      . omeprazole (PRILOSEC) 40 MG capsule     . PRODIGY NO CODING BLOOD GLUC test strip 3 (three) times daily.     Marland Kitchen PRODIGY TWIST TOP LANCETS 28G MISC 3 (three) times daily.     . simvastatin (ZOCOR) 40 MG tablet Take 40 mg by mouth daily.     . traMADol Veatrice Bourbon)  50 MG tablet Take 1 tablet (50 mg total) by mouth every 6 (six) hours as needed. 30 tablet 0   No current facility-administered medications for this visit.     Past Medical History:  Diagnosis Date  . Breast cancer ([place])    s/p lumpectomy, XRT  . Carotid artery disease ([place])   . Coronary artery disease    1 stent   . Diabetes mellitus    takes Metformin daily  . Hepatitis C   . High cholesterol    takes Lopid daily  . History of UTI    takes Diflucan dail--pt states no uti in a couple of yrs though  . Hypertension    takes Losartan,Amlodipine,HCTZ,and Atenolol daily and Clatarpres  . Joint pain    legs  . Renal artery stenosis ([facility])    Right renal artery stent 05/31/06  . Stroke [facility]) Jan. 7, 2014  . Tobacco abuse      Social History Social History  Substance Use Topics  . Smoking status: Current Some Day Smoker    Packs/day: 0.25    Years: 50.00    Types: Cigarettes    Last attempt to quit: 12/29/2011  . Smokeless tobacco: Never Used     Comment: pt states she only smokes 2-3 cigs per day  . Alcohol use No    Family History Family History  Problem Relation Age of Onset  . Cancer Mother     ? type  . Cancer Father     ? type  . Diabetes Sister   . Hypertension Sister   . Diabetes Brother   . Hypertension Brother   . CAD Neg Hx     Surgical History Past Surgical History:  Procedure Laterality Date  . ABDOMINAL HYSTERECTOMY    . ADRENAL GLAND SURGERY    . BACK SURGERY    . BREAST LUMPECTOMY     right  . CAROTID ENDARTERECTOMY    . CORONARY ANGIOPLASTY  2008   1 stent  . ENDARTERECTOMY  01/05/2012   Procedure: ENDARTERECTOMY CAROTID;  Surgeon: Conrad Alsey, MD;  Location: [place];  Service: Vascular;  Laterality: Right;  Right Carotid Artery Endarterectomy, right stump pressure, intraoperative ultrasound  . ENDARTERECTOMY  02/08/2012   Procedure: ENDARTERECTOMY CAROTID;  Surgeon: Conrad [place], MD;  Location: [place];  Service: Vascular;  Laterality: Left;  . PATCH ANGIOPLASTY  02/08/2012   Procedure: PATCH ANGIOPLASTY;  Surgeon: Conrad [place], MD;  Location: [facility];  Service: Vascular;  Laterality: Left;  Carotid artery patch angioplasty using Vascu-Guard bovine patch 1cm x 6cm.  . SPINE SURGERY      Allergies  Allergen Reactions  . Chlorhexidine Gluconate Itching  . Ace Inhibitors Cough  . Dilaudid [Hydromorphone Hcl] Itching    Current Outpatient Prescriptions  Medication Sig Dispense Refill  . amLODipine (NORVASC) 10 MG tablet Take 10 mg by mouth at bedtime.      Marland Kitchen aspirin EC 81 MG tablet Take 81 mg by mouth daily.      Marland Kitchen atenolol (TENORMIN) 100 MG tablet Take 100 mg by mouth every morning.      . cilostazol (PLETAL) 100 MG tablet Take 1 tablet (100 mg total) by mouth 2 (two)  times daily before a meal. 60 tablet 11  . cloNIDine (CATAPRES) 0.1 MG tablet Take 0.1 mg by mouth 2 (two) times daily.      . clopidogrel (PLAVIX) 75 MG tablet Take 1 tablet (75 mg total) by mouth daily. 30 tablet  11  . gemfibrozil (LOPID) 600 MG tablet Take 600 mg by mouth 2 (two) times daily.      . hydrochlorothiazide (HYDRODIURIL) 12.5 MG tablet Take 12.5 mg by mouth daily.     Marland Kitchen losartan (COZAAR) 100 MG tablet Take 100 mg by mouth daily.     . metFORMIN (GLUCOPHAGE) 500 MG tablet Take 500 mg by mouth 2 (two) times daily with a meal.      . omeprazole (PRILOSEC) 40 MG capsule     . PRODIGY NO CODING BLOOD GLUC test strip 3 (three) times daily.     Marland Kitchen PRODIGY TWIST TOP LANCETS 28G MISC 3 (three) times daily.     . simvastatin (ZOCOR) 40 MG tablet Take 40 mg by mouth daily.     . traMADol (ULTRAM) 50 MG tablet Take 1 tablet (50 mg total) by mouth every 6 (six) hours as needed. 30 tablet 0   No current facility-administered medications for this visit.      REVIEW OF SYSTEMS: See HPI for pertinent positives and negatives.  Physical Examination Vitals:   09/19/15 1058 09/19/15 1059  BP: (!) 157/77 (!) 160/74  Pulse: (!) 57   Resp: 16   Temp: 98.3 F (36.8 C)   TempSrc: Oral   SpO2: 99%   Weight: 125 lb (56.7 kg)   Height: 5\' 5"  (1.651 m)    Body mass index is 20.8 kg/m.   General: WDWN in NAD Gait: Normal HENT: WNL Eyes: PERRLA Pulmonary: normal non-labored breathing, no rales, rhonchi, or wheezing, diminished breath sounds in all fields, occasional dry cough Cardiac: RRR, no murmurs detected  Abdomen: soft, NT, no palpable masses Skin: no rashes, no ulcers, no cellulitis.  VASCULAR EXAM  Carotid Bruits Left Right   Positive Positive   Radial pulses are 1+ palpable and = Aorta is not palpable.   VASCULAR EXAM: Extremities without ischemic changes  without Gangrene; without open  wounds.     LE Pulses LEFT RIGHT   FEMORAL 1+ palpable 1+ palpable    POPLITEAL not palpable  not palpable   POSTERIOR TIBIAL not palpable  not palpable    DORSALIS PEDIS  ANTERIOR TIBIAL not palpable  not palpable     Musculoskeletal: no muscle wasting or atrophy; no peripheral edema Neurologic: A&O X 3; Appropriate Affect ;  SENSATION: normal; MOTOR FUNCTION: 5/5 Symmetric, CN 2-12 intact Speech is fluent/normal   Non-Invasive Vascular Imaging (09/19/15): CEREBROVASCULAR DUPLEX EVALUATION    INDICATION: Carotid endarterectomy     PREVIOUS INTERVENTION(S): Right carotid endarterectomy on 01/05/12; Left carotid endarterectomy on 02/09/12    DUPLEX EXAM:     RIGHT  LEFT  Peak Systolic Velocities (cm/s) End Diastolic Velocities (cm/s) Plaque LOCATION Peak Systolic Velocities (cm/s) End Diastolic Velocities (cm/s) Plaque  174 0  CCA PROXIMAL 106 12   72 11  CCA MID 66 10   73 8  CCA DISTAL 58 7   110 0  ECA 84 0   62 13  ICA PROXIMAL 36 12   66 17  ICA MID 49 15   90 25  ICA DISTAL 47 12      ICA / CCA Ratio (PSV)   Antegrade Vertebral Flow Antegrade   Brachial Systolic Pressure (mmHg)   Triphasic Subclavian Artery Waveforms Triphasic    Plaque Morphology:  HM = Homogeneous, HT = Heterogeneous, CP = Calcific Plaque, SP = Smooth Plaque, IP = Irregular Plaque     ADDITIONAL FINDINGS:     IMPRESSION: Patent  bilateral carotid endarterectomy sites with no bilateral internal carotid artery stenoses.    Compared to the previous exam:  No significant change noted when compared to the previous exam on 06/07/14.       ASSESSMENT:  Kristen Bradley is a 70 y.o. female who is s/p right CEA on 01/05/2012, and left CEA using patch angioplasty Vascu-Guard bovine patch  1cm x 6cm on 02/08/2012.  She has not had a TIA or stroke since 2013. She also has PAOD. She has moderate claudication, no signs of ischemia in her feet/legs.  DATA Carotid duplex suggests bilateral carotid endarterectomy sites with no internal carotid artery stenoses. No significant change in comparison to the last exam on 06/07/14.  ABI's are stable on the left with severe arterial occlusive disease, and improved on the right to moderate arterial occlusive disease, all monophasic waveforms.   Pt states she has not been taking Pletal, she does not have CHF, denies any hx of syncope. Prescribed Pletal, to her mail order [facility] at pt request.   Her atherosclerotic risk factors include controlled DM and continued smoking.   PLAN:   Graduated walking program discussed and demonstrated.   Based on today's exam and non-invasive vascular lab results, the patient will follow up in 6 months  with the following tests: ABI's, carotid duplex in a year.  I discussed in depth with the patient the nature of atherosclerosis, and emphasized the importance of maximal medical management including strict control of blood pressure, blood glucose, and lipid levels, obtaining regular exercise, and cessation of smoking.  The patient is aware that without maximal medical management the underlying atherosclerotic disease process will progress, limiting the benefit of any interventions.  The patient was given information about stroke prevention and what symptoms should prompt the patient to seek immediate medical care.  The patient was given information about PAD including signs, symptoms, treatment, what symptoms should prompt the patient to seek immediate medical care, and risk reduction measures to take. Thank you for allowing [place] to participate in this patient's care.  Clemon Chambers, RN, MSN, FNP-C [facility] Office: [phone]  Clinic MD: Donzetta Matters 09/19/2015 11:18 AM

## 2015-09-19 NOTE — Patient Instructions (Signed)
Stroke Prevention Some medical conditions and behaviors are associated with an increased chance of having a stroke. You may prevent a stroke by making healthy choices and managing medical conditions. HOW CAN I REDUCE MY RISK OF HAVING A STROKE?   Stay physically active. Get at least 30 minutes of activity on most or all days.  Do not smoke. It may also be helpful to avoid exposure to secondhand smoke.  Limit alcohol use. Moderate alcohol use is considered to be:  No more than 2 drinks per day for men.  No more than 1 drink per day for nonpregnant women.  Eat healthy foods. This involves:  Eating 5 or more servings of fruits and vegetables a day.  Making dietary changes that address high blood pressure (hypertension), high cholesterol, diabetes, or obesity.  Manage your cholesterol levels.  Making food choices that are high in fiber and low in saturated fat, trans fat, and cholesterol may control cholesterol levels.  Take any prescribed medicines to control cholesterol as directed by your health care provider.  Manage your diabetes.  Controlling your carbohydrate and sugar intake is recommended to manage diabetes.  Take any prescribed medicines to control diabetes as directed by your health care provider.  Control your hypertension.  Making food choices that are low in salt (sodium), saturated fat, trans fat, and cholesterol is recommended to manage hypertension.  Ask your health care provider if you need treatment to lower your blood pressure. Take any prescribed medicines to control hypertension as directed by your health care provider.  If you are 18-39 years of age, have your blood pressure checked every 3-5 years. If you are 40 years of age or older, have your blood pressure checked every year.  Maintain a healthy weight.  Reducing calorie intake and making food choices that are low in sodium, saturated fat, trans fat, and cholesterol are recommended to manage  weight.  Stop drug abuse.  Avoid taking birth control pills.  Talk to your health care provider about the risks of taking birth control pills if you are over 35 years old, smoke, get migraines, or have ever had a blood clot.  Get evaluated for sleep disorders (sleep apnea).  Talk to your health care provider about getting a sleep evaluation if you snore a lot or have excessive sleepiness.  Take medicines only as directed by your health care provider.  For some people, aspirin or blood thinners (anticoagulants) are helpful in reducing the risk of forming abnormal blood clots that can lead to stroke. If you have the irregular heart rhythm of atrial fibrillation, you should be on a blood thinner unless there is a good reason you cannot take them.  Understand all your medicine instructions.  Make sure that other conditions (such as anemia or atherosclerosis) are addressed. SEEK IMMEDIATE MEDICAL CARE IF:   You have sudden weakness or numbness of the face, arm, or leg, especially on one side of the body.  Your face or eyelid droops to one side.  You have sudden confusion.  You have trouble speaking (aphasia) or understanding.  You have sudden trouble seeing in one or both eyes.  You have sudden trouble walking.  You have dizziness.  You have a loss of balance or coordination.  You have a sudden, severe headache with no known cause.  You have new chest pain or an irregular heartbeat. Any of these symptoms may represent a serious problem that is an emergency. Do not wait to see if the symptoms will   go away. Get medical help at once. Call your local emergency services (911 in [place] Do not drive yourself to the hospital.   This information is not intended to replace advice given to you by your health care provider. Make sure you discuss any questions you have with your health care provider.   Document Released: 02/26/2004 Document Revised: 02/08/2014 Document Reviewed:  07/21/2012 Elsevier Interactive Patient Education 2016 [employer]    Peripheral Vascular Disease Peripheral vascular disease (PVD) is a disease of the blood vessels that are not part of your heart and brain. A simple term for PVD is poor circulation. In most cases, PVD narrows the blood vessels that carry blood from your heart to the rest of your body. This can result in a decreased supply of blood to your arms, legs, and internal organs, like your stomach or kidneys. However, it most often affects a person's lower legs and feet. There are two types of PVD.  Organic PVD. This is the more common type. It is caused by damage to the structure of blood vessels.  Functional PVD. This is caused by conditions that make blood vessels contract and tighten (spasm). Without treatment, PVD tends to get worse over time. PVD can also lead to acute ischemic limb. This is when an arm or limb suddenly has trouble getting enough blood. This is a medical emergency. CAUSES Each type of PVD has many different causes. The most common cause of PVD is buildup of a fatty material (plaque) inside of your arteries (atherosclerosis). Small amounts of plaque can break off from the walls of the blood vessels and become lodged in a smaller artery. This blocks blood flow and can cause acute ischemic limb. Other common causes of PVD include:  Blood clots that form inside of blood vessels.  Injuries to blood vessels.  Diseases that cause inflammation of blood vessels or cause blood vessel spasms.  Health behaviors and health history that increase your risk of developing PVD. RISK FACTORS  You may have a greater risk of PVD if you:  Have a family history of PVD.  Have certain medical conditions, including:  High cholesterol.  Diabetes.  High blood pressure (hypertension).  Coronary heart disease.  Past problems with blood clots.  Past injury, such as burns or a broken bone. These may have damaged blood  vessels in your limbs.  Buerger disease. This is caused by inflamed blood vessels in your hands and feet.  Some forms of arthritis.  Rare birth defects that affect the arteries in your legs.  Use tobacco.  Do not get enough exercise.  Are obese.  Are age 50 or older. SIGNS AND SYMPTOMS  PVD may cause many different symptoms. Your symptoms depend on what part of your body is not getting enough blood. Some common signs and symptoms include:  Cramps in your lower legs. This may be a symptom of poor leg circulation (claudication).  Pain and weakness in your legs while you are physically active that goes away when you rest (intermittent claudication).  Leg pain when at rest.  Leg numbness, tingling, or weakness.  Coldness in a leg or foot, especially when compared with the other leg.  Skin or hair changes. These can include:  Hair loss.  Shiny skin.  Pale or bluish skin.  Thick toenails.  Inability to get or maintain an erection (erectile dysfunction). People with PVD are more prone to developing ulcers and sores on their toes, feet, or legs. These may take longer than   normal to heal. DIAGNOSIS Your health care provider may diagnose PVD from your signs and symptoms. The health care provider will also do a physical exam. You may have tests to find out what is causing your PVD and determine its severity. Tests may include:  Blood pressure recordings from your arms and legs and measurements of the strength of your pulses (pulse volume recordings).  Imaging studies using sound waves to take pictures of the blood flow through your blood vessels (Doppler ultrasound).  Injecting a dye into your blood vessels before having imaging studies using:  X-rays (angiogram or arteriogram).  Computer-generated X-rays (CT angiogram).  A powerful electromagnetic field and a computer (magnetic resonance angiogram or MRA). TREATMENT Treatment for PVD depends on the cause of your condition  and the severity of your symptoms. It also depends on your age. Underlying causes need to be treated and controlled. These include long-lasting (chronic) conditions, such as diabetes, high cholesterol, and high blood pressure. You may need to first try making lifestyle changes and taking medicines. Surgery may be needed if these do not work. Lifestyle changes may include:  Quitting smoking.  Exercising regularly.  Following a low-fat, low-cholesterol diet. Medicines may include:  Blood thinners to prevent blood clots.  Medicines to improve blood flow.  Medicines to improve your blood cholesterol levels. Surgical procedures may include:  A procedure that uses an inflated balloon to open a blocked artery and improve blood flow (angioplasty).  A procedure to put in a tube (stent) to keep a blocked artery open (stent implant).  Surgery to reroute blood flow around a blocked artery (peripheral bypass surgery).  Surgery to remove dead tissue from an infected wound on the affected limb.  Amputation. This is surgical removal of the affected limb. This may be necessary in cases of acute ischemic limb that are not improved through medical or surgical treatments. HOME CARE INSTRUCTIONS  Take medicines only as directed by your health care provider.  Do not use any tobacco products, including cigarettes, chewing tobacco, or electronic cigarettes. If you need help quitting, ask your health care provider.  Lose weight if you are overweight, and maintain a healthy weight as directed by your health care provider.  Eat a diet that is low in fat and cholesterol. If you need help, ask your health care provider.  Exercise regularly. Ask your health care provider to suggest some good activities for you.  Use compression stockings or other mechanical devices as directed by your health care provider.  Take good care of your feet.  Wear comfortable shoes that fit well.  Check your feet often for  any cuts or sores. SEEK MEDICAL CARE IF:  You have cramps in your legs while walking.  You have leg pain when you are at rest.  You have coldness in a leg or foot.  Your skin changes.  You have erectile dysfunction.  You have cuts or sores on your feet that are not healing. SEEK IMMEDIATE MEDICAL CARE IF:  Your arm or leg turns cold and blue.  Your arms or legs become red, warm, swollen, painful, or numb.  You have chest pain or trouble breathing.  You suddenly have weakness in your face, arm, or leg.  You become very confused or lose the ability to speak.  You suddenly have a very bad headache or lose your vision.   This information is not intended to replace advice given to you by your health care provider. Make sure you discuss any questions   you have with your health care provider.   Document Released: 02/26/2004 Document Revised: 02/08/2014 Document Reviewed: 06/28/2013 Elsevier Interactive Patient Education 2016 [employer]     Steps to Quit Smoking  Smoking tobacco can be harmful to your health and can affect almost every organ in your body. Smoking puts you, and those around you, at risk for developing many serious chronic diseases. Quitting smoking is difficult, but it is one of the best things that you can do for your health. It is never too late to quit. WHAT ARE THE BENEFITS OF QUITTING SMOKING? When you quit smoking, you lower your risk of developing serious diseases and conditions, such as:  Lung cancer or lung disease, such as COPD.  Heart disease.  Stroke.  Heart attack.  Infertility.  Osteoporosis and bone fractures. Additionally, symptoms such as coughing, wheezing, and shortness of breath may get better when you quit. You may also find that you get sick less often because your body is stronger at fighting off colds and infections. If you are pregnant, quitting smoking can help to reduce your chances of having a baby of low birth weight. HOW DO  I GET READY TO QUIT? When you decide to quit smoking, create a plan to make sure that you are successful. Before you quit:  Pick a date to quit. Set a date within the next two weeks to give you time to prepare.  Write down the reasons why you are quitting. Keep this list in places where you will see it often, such as on your bathroom mirror or in your car or wallet.  Identify the people, places, things, and activities that make you want to smoke (triggers) and avoid them. Make sure to take these actions:  Throw away all cigarettes at home, at work, and in your car.  Throw away smoking accessories, such as ashtrays and lighters.  Clean your car and make sure to empty the ashtray.  Clean your home, including curtains and carpets.  Tell your family, friends, and coworkers that you are quitting. Support from your loved ones can make quitting easier.  Talk with your health care provider about your options for quitting smoking.  Find out what treatment options are covered by your health insurance. WHAT STRATEGIES CAN I USE TO QUIT SMOKING?  Talk with your healthcare provider about different strategies to quit smoking. Some strategies include:  Quitting smoking altogether instead of gradually lessening how much you smoke over a period of time. Research shows that quitting "cold turkey" is more successful than gradually quitting.  Attending in-person counseling to help you build problem-solving skills. You are more likely to have success in quitting if you attend several counseling sessions. Even short sessions of 10 minutes can be effective.  Finding resources and support systems that can help you to quit smoking and remain smoke-free after you quit. These resources are most helpful when you use them often. They can include:  Online chats with a counselor.  Telephone quitlines.  Printed self-help materials.  Support groups or group counseling.  Text messaging programs.  Mobile phone  applications.  Taking medicines to help you quit smoking. (If you are pregnant or breastfeeding, talk with your health care provider first.) Some medicines contain nicotine and some do not. Both types of medicines help with cravings, but the medicines that include nicotine help to relieve withdrawal symptoms. Your health care provider may recommend:  Nicotine patches, gum, or lozenges.  Nicotine inhalers or sprays.  Non-nicotine medicine that   is taken by mouth. Talk with your health care provider about combining strategies, such as taking medicines while you are also receiving in-person counseling. Using these two strategies together makes you more likely to succeed in quitting than if you used either strategy on its own. If you are pregnant or breastfeeding, talk with your health care provider about finding counseling or other support strategies to quit smoking. Do not take medicine to help you quit smoking unless told to do so by your health care provider. WHAT THINGS CAN I DO TO MAKE IT EASIER TO QUIT? Quitting smoking might feel overwhelming at first, but there is a lot that you can do to make it easier. Take these important actions:  Reach out to your family and friends and ask that they support and encourage you during this time. Call telephone quitlines, reach out to support groups, or work with a counselor for support.  Ask people who smoke to avoid smoking around you.  Avoid places that trigger you to smoke, such as bars, parties, or smoke-break areas at work.  Spend time around people who do not smoke.  Lessen stress in your life, because stress can be a smoking trigger for some people. To lessen stress, try:  Exercising regularly.  Deep-breathing exercises.  Yoga.  Meditating.  Performing a body scan. This involves closing your eyes, scanning your body from head to toe, and noticing which parts of your body are particularly tense. Purposefully relax the muscles in those  areas.  Download or purchase mobile phone or tablet apps (applications) that can help you stick to your quit plan by providing reminders, tips, and encouragement. There are many free apps, such as QuitGuide from the CDC (Centers for Disease Control and Prevention). You can find other support for quitting smoking (smoking cessation) through smokefree.gov and other websites. HOW WILL I FEEL WHEN I QUIT SMOKING? Within the first 24 hours of quitting smoking, you may start to feel some withdrawal symptoms. These symptoms are usually most noticeable 2-3 days after quitting, but they usually do not last beyond 2-3 weeks. Changes or symptoms that you might experience include:  Mood swings.  Restlessness, anxiety, or irritation.  Difficulty concentrating.  Dizziness.  Strong cravings for sugary foods in addition to nicotine.  Mild weight gain.  Constipation.  Nausea.  Coughing or a sore throat.  Changes in how your medicines work in your body.  A depressed mood.  Difficulty sleeping (insomnia). After the first 2-3 weeks of quitting, you may start to notice more positive results, such as:  Improved sense of smell and taste.  Decreased coughing and sore throat.  Slower heart rate.  Lower blood pressure.  Clearer skin.  The ability to breathe more easily.  Fewer sick days. Quitting smoking is very challenging for most people. Do not get discouraged if you are not successful the first time. Some people need to make many attempts to quit before they achieve long-term success. Do your best to stick to your quit plan, and talk with your health care provider if you have any questions or concerns.   This information is not intended to replace advice given to you by your health care provider. Make sure you discuss any questions you have with your health care provider.   Document Released: 01/12/2001 Document Revised: 06/04/2014 Document Reviewed: 06/04/2014 Elsevier Interactive Patient  Education 2016 [employer]  

## 2015-09-22 ENCOUNTER — Other Ambulatory Visit: Payer: Self-pay

## 2015-09-22 DIAGNOSIS — I739 Peripheral vascular disease, unspecified: Secondary | ICD-10-CM

## 2015-09-22 MED ORDER — CILOSTAZOL 100 MG PO TABS: 100.0000 mg | ORAL_TABLET | Freq: Two times a day (BID) | ORAL | 11 refills | Status: DC

## 2015-09-26 ENCOUNTER — Ambulatory Visit
Admission: RE | Admit: 2015-09-26 | Discharge: 2015-09-26 | Disposition: A | Discharge status: Home / Self Care | Payer: Commercial Managed Care - HMO | Source: Ambulatory Visit | Attending: Family Medicine | Admitting: Family Medicine

## 2015-09-26 DIAGNOSIS — Z1231 Encounter for screening mammogram for malignant neoplasm of breast: Secondary | ICD-10-CM | POA: Diagnosis not present

## 2015-10-21 ENCOUNTER — Other Ambulatory Visit: Payer: Self-pay | Admitting: *Deleted

## 2015-10-21 NOTE — Telephone Encounter (Signed)
Authorization info for  pletal given to [facility]  Ref #[id number]

## 2016-01-06 DIAGNOSIS — E559 Vitamin D deficiency, unspecified: Secondary | ICD-10-CM | POA: Diagnosis not present

## 2016-01-06 DIAGNOSIS — M8588 Other specified disorders of bone density and structure, other site: Secondary | ICD-10-CM | POA: Diagnosis not present

## 2016-01-06 DIAGNOSIS — N183 Chronic kidney disease, stage 3 (moderate): Secondary | ICD-10-CM | POA: Diagnosis not present

## 2016-01-06 DIAGNOSIS — E785 Hyperlipidemia, unspecified: Secondary | ICD-10-CM | POA: Diagnosis not present

## 2016-01-06 DIAGNOSIS — I739 Peripheral vascular disease, unspecified: Secondary | ICD-10-CM | POA: Diagnosis not present

## 2016-01-06 DIAGNOSIS — I10 Essential (primary) hypertension: Secondary | ICD-10-CM | POA: Diagnosis not present

## 2016-01-06 DIAGNOSIS — B182 Chronic viral hepatitis C: Secondary | ICD-10-CM | POA: Diagnosis not present

## 2016-01-06 DIAGNOSIS — I6523 Occlusion and stenosis of bilateral carotid arteries: Secondary | ICD-10-CM | POA: Diagnosis not present

## 2016-01-06 DIAGNOSIS — E119 Type 2 diabetes mellitus without complications: Secondary | ICD-10-CM | POA: Diagnosis not present

## 2016-01-09 DIAGNOSIS — N289 Disorder of kidney and ureter, unspecified: Secondary | ICD-10-CM | POA: Diagnosis not present

## 2016-01-09 DIAGNOSIS — D649 Anemia, unspecified: Secondary | ICD-10-CM | POA: Diagnosis not present

## 2016-01-29 ENCOUNTER — Encounter (HOSPITAL_COMMUNITY): Payer: Self-pay | Admitting: *Deleted

## 2016-01-29 ENCOUNTER — Emergency Department (HOSPITAL_COMMUNITY): Payer: Commercial Managed Care - HMO

## 2016-01-29 ENCOUNTER — Inpatient Hospital Stay (HOSPITAL_COMMUNITY)
Admission: EM | Admit: 2016-01-29 | Discharge: 2016-02-04 | DRG: 280 | Disposition: A | Discharge status: Home / Self Care | Payer: Commercial Managed Care - HMO | Attending: Internal Medicine | Admitting: Internal Medicine

## 2016-01-29 DIAGNOSIS — F17210 Nicotine dependence, cigarettes, uncomplicated: Secondary | ICD-10-CM | POA: Diagnosis not present

## 2016-01-29 DIAGNOSIS — I5043 Acute on chronic combined systolic (congestive) and diastolic (congestive) heart failure: Secondary | ICD-10-CM | POA: Diagnosis present

## 2016-01-29 DIAGNOSIS — I214 Non-ST elevation (NSTEMI) myocardial infarction: Principal | ICD-10-CM

## 2016-01-29 DIAGNOSIS — J9600 Acute respiratory failure, unspecified whether with hypoxia or hypercapnia: Secondary | ICD-10-CM | POA: Diagnosis not present

## 2016-01-29 DIAGNOSIS — I70211 Atherosclerosis of native arteries of extremities with intermittent claudication, right leg: Secondary | ICD-10-CM | POA: Diagnosis not present

## 2016-01-29 DIAGNOSIS — I701 Atherosclerosis of renal artery: Secondary | ICD-10-CM | POA: Diagnosis present

## 2016-01-29 DIAGNOSIS — I455 Other specified heart block: Secondary | ICD-10-CM | POA: Diagnosis not present

## 2016-01-29 DIAGNOSIS — N183 Chronic kidney disease, stage 3 (moderate): Secondary | ICD-10-CM | POA: Diagnosis present

## 2016-01-29 DIAGNOSIS — I509 Heart failure, unspecified: Secondary | ICD-10-CM | POA: Diagnosis not present

## 2016-01-29 DIAGNOSIS — I130 Hypertensive heart and chronic kidney disease with heart failure and stage 1 through stage 4 chronic kidney disease, or unspecified chronic kidney disease: Secondary | ICD-10-CM | POA: Diagnosis not present

## 2016-01-29 DIAGNOSIS — N19 Unspecified kidney failure: Secondary | ICD-10-CM

## 2016-01-29 DIAGNOSIS — Z79899 Other long term (current) drug therapy: Secondary | ICD-10-CM

## 2016-01-29 DIAGNOSIS — I739 Peripheral vascular disease, unspecified: Secondary | ICD-10-CM | POA: Diagnosis not present

## 2016-01-29 DIAGNOSIS — Z853 Personal history of malignant neoplasm of breast: Secondary | ICD-10-CM

## 2016-01-29 DIAGNOSIS — R001 Bradycardia, unspecified: Secondary | ICD-10-CM

## 2016-01-29 DIAGNOSIS — Z7902 Long term (current) use of antithrombotics/antiplatelets: Secondary | ICD-10-CM

## 2016-01-29 DIAGNOSIS — E1122 Type 2 diabetes mellitus with diabetic chronic kidney disease: Secondary | ICD-10-CM | POA: Diagnosis present

## 2016-01-29 DIAGNOSIS — E876 Hypokalemia: Secondary | ICD-10-CM | POA: Diagnosis present

## 2016-01-29 DIAGNOSIS — I10 Essential (primary) hypertension: Secondary | ICD-10-CM | POA: Diagnosis present

## 2016-01-29 DIAGNOSIS — E1151 Type 2 diabetes mellitus with diabetic peripheral angiopathy without gangrene: Secondary | ICD-10-CM | POA: Diagnosis present

## 2016-01-29 DIAGNOSIS — I70219 Atherosclerosis of native arteries of extremities with intermittent claudication, unspecified extremity: Secondary | ICD-10-CM | POA: Diagnosis present

## 2016-01-29 DIAGNOSIS — N178 Other acute kidney failure: Secondary | ICD-10-CM | POA: Diagnosis not present

## 2016-01-29 DIAGNOSIS — Z8673 Personal history of transient ischemic attack (TIA), and cerebral infarction without residual deficits: Secondary | ICD-10-CM | POA: Diagnosis not present

## 2016-01-29 DIAGNOSIS — D696 Thrombocytopenia, unspecified: Secondary | ICD-10-CM | POA: Diagnosis present

## 2016-01-29 DIAGNOSIS — N179 Acute kidney failure, unspecified: Secondary | ICD-10-CM | POA: Diagnosis not present

## 2016-01-29 DIAGNOSIS — B1920 Unspecified viral hepatitis C without hepatic coma: Secondary | ICD-10-CM | POA: Diagnosis present

## 2016-01-29 DIAGNOSIS — D693 Immune thrombocytopenic purpura: Secondary | ICD-10-CM | POA: Diagnosis not present

## 2016-01-29 DIAGNOSIS — Z7982 Long term (current) use of aspirin: Secondary | ICD-10-CM

## 2016-01-29 DIAGNOSIS — J189 Pneumonia, unspecified organism: Secondary | ICD-10-CM

## 2016-01-29 DIAGNOSIS — R7989 Other specified abnormal findings of blood chemistry: Secondary | ICD-10-CM | POA: Diagnosis present

## 2016-01-29 DIAGNOSIS — Z7984 Long term (current) use of oral hypoglycemic drugs: Secondary | ICD-10-CM

## 2016-01-29 DIAGNOSIS — J9601 Acute respiratory failure with hypoxia: Secondary | ICD-10-CM | POA: Diagnosis not present

## 2016-01-29 DIAGNOSIS — J181 Lobar pneumonia, unspecified organism: Secondary | ICD-10-CM | POA: Diagnosis not present

## 2016-01-29 DIAGNOSIS — E7800 Pure hypercholesterolemia, unspecified: Secondary | ICD-10-CM | POA: Diagnosis present

## 2016-01-29 DIAGNOSIS — I5041 Acute combined systolic (congestive) and diastolic (congestive) heart failure: Secondary | ICD-10-CM | POA: Diagnosis not present

## 2016-01-29 DIAGNOSIS — E118 Type 2 diabetes mellitus with unspecified complications: Secondary | ICD-10-CM | POA: Diagnosis present

## 2016-01-29 DIAGNOSIS — I2510 Atherosclerotic heart disease of native coronary artery without angina pectoris: Secondary | ICD-10-CM | POA: Diagnosis not present

## 2016-01-29 DIAGNOSIS — I6523 Occlusion and stenosis of bilateral carotid arteries: Secondary | ICD-10-CM | POA: Diagnosis not present

## 2016-01-29 DIAGNOSIS — I447 Left bundle-branch block, unspecified: Secondary | ICD-10-CM | POA: Diagnosis present

## 2016-01-29 DIAGNOSIS — I70213 Atherosclerosis of native arteries of extremities with intermittent claudication, bilateral legs: Secondary | ICD-10-CM | POA: Diagnosis not present

## 2016-01-29 DIAGNOSIS — R748 Abnormal levels of other serum enzymes: Secondary | ICD-10-CM | POA: Diagnosis not present

## 2016-01-29 DIAGNOSIS — I255 Ischemic cardiomyopathy: Secondary | ICD-10-CM | POA: Diagnosis not present

## 2016-01-29 DIAGNOSIS — R778 Other specified abnormalities of plasma proteins: Secondary | ICD-10-CM | POA: Diagnosis present

## 2016-01-29 DIAGNOSIS — I5021 Acute systolic (congestive) heart failure: Secondary | ICD-10-CM | POA: Diagnosis not present

## 2016-01-29 DIAGNOSIS — R069 Unspecified abnormalities of breathing: Secondary | ICD-10-CM | POA: Diagnosis not present

## 2016-01-29 DIAGNOSIS — I420 Dilated cardiomyopathy: Secondary | ICD-10-CM | POA: Diagnosis present

## 2016-01-29 DIAGNOSIS — R0602 Shortness of breath: Secondary | ICD-10-CM | POA: Diagnosis not present

## 2016-01-29 HISTORY — DX: Malignant neoplasm of unspecified site of right female breast: C50.911

## 2016-01-29 HISTORY — DX: Type 2 diabetes mellitus without complications: E11.9

## 2016-01-29 HISTORY — DX: Pneumonia, unspecified organism: J18.9

## 2016-01-29 HISTORY — DX: Neoplasm of unspecified behavior of endocrine glands and other parts of nervous system: D49.7

## 2016-01-29 LAB — COMPREHENSIVE METABOLIC PANEL
ALBUMIN: 3.7 g/dL (ref 3.5–5.0)
ALK PHOS: 107 U/L (ref 38–126)
ALT: 19 U/L (ref 14–54)
AST: 30 U/L (ref 15–41)
Anion gap: 10 (ref 5–15)
BILIRUBIN TOTAL: 0.6 mg/dL (ref 0.3–1.2)
BUN: 18 mg/dL (ref 6–20)
CALCIUM: 11.1 mg/dL — AB (ref 8.9–10.3)
CO2: 26 mmol/L (ref 22–32)
CREATININE: 1.66 mg/dL — AB (ref 0.44–1.00)
Chloride: 103 mmol/L (ref 101–111)
GFR calc Af Amer: 35 mL/min — ABNORMAL LOW (ref >60)
GFR calc non Af Amer: 30 mL/min — ABNORMAL LOW (ref >60)
GLUCOSE: 182 mg/dL — AB (ref 65–99)
Potassium: 3.2 mmol/L — ABNORMAL LOW (ref 3.5–5.1)
SODIUM: 139 mmol/L (ref 135–145)
TOTAL PROTEIN: 7.5 g/dL (ref 6.5–8.1)

## 2016-01-29 LAB — CBC
HCT: 37.2 % (ref 36.0–46.0)
HEMATOCRIT: 45.6 % (ref 36.0–46.0)
HEMOGLOBIN: 14.5 g/dL (ref 12.0–15.0)
Hemoglobin: 12.1 g/dL (ref 12.0–15.0)
MCH: 29.5 pg (ref 26.0–34.0)
MCH: 28.9 pg (ref 26.0–34.0)
MCHC: 31.8 g/dL (ref 30.0–36.0)
MCHC: 32.5 g/dL (ref 30.0–36.0)
MCV: 92.7 fL (ref 78.0–100.0)
MCV: 88.8 fL (ref 78.0–100.0)
PLATELETS: 77 10*3/uL — AB (ref 150–400)
Platelets: 148 10*3/uL — ABNORMAL LOW (ref 150–400)
RBC: 4.92 MIL/uL (ref 3.87–5.11)
RBC: 4.19 MIL/uL (ref 3.87–5.11)
RDW: 15.1 % (ref 11.5–15.5)
RDW: 14.5 % (ref 11.5–15.5)
WBC: 12.4 10*3/uL — AB (ref 4.0–10.5)
WBC: 6.1 10*3/uL (ref 4.0–10.5)

## 2016-01-29 LAB — HIV ANTIBODY (ROUTINE TESTING W REFLEX): HIV Screen 4th Generation wRfx: NONREACTIVE

## 2016-01-29 LAB — STREP PNEUMONIAE URINARY ANTIGEN: STREP PNEUMO URINARY ANTIGEN: NEGATIVE

## 2016-01-29 LAB — CREATININE, SERUM
Creatinine, Ser: 1.87 mg/dL — ABNORMAL HIGH (ref 0.44–1.00)
GFR, EST AFRICAN AMERICAN: 31 mL/min — AB (ref >60)
GFR, EST NON AFRICAN AMERICAN: 26 mL/min — AB (ref >60)

## 2016-01-29 LAB — CBG MONITORING, ED: Glucose-Capillary: 281 mg/dL — ABNORMAL HIGH (ref 65–99)

## 2016-01-29 LAB — EXPECTORATED SPUTUM ASSESSMENT W GRAM STAIN, RFLX TO RESP C

## 2016-01-29 LAB — TSH: TSH: 0.978 u[IU]/mL (ref 0.350–4.500)

## 2016-01-29 LAB — I-STAT ARTERIAL BLOOD GAS, ED
ACID-BASE DEFICIT: 2 mmol/L (ref 0.0–2.0)
BICARBONATE: 23.7 mmol/L (ref 20.0–28.0)
O2 Saturation: 99 %
TCO2: 25 mmol/L (ref 0–100)
pCO2 arterial: 44.9 mmHg (ref 32.0–48.0)
pH, Arterial: 7.33 — ABNORMAL LOW (ref 7.350–7.450)
pO2, Arterial: 138 mmHg — ABNORMAL HIGH (ref 83.0–108.0)

## 2016-01-29 LAB — EXPECTORATED SPUTUM ASSESSMENT W REFEX TO RESP CULTURE

## 2016-01-29 LAB — TROPONIN I
TROPONIN I: 0.48 ng/mL — AB (ref <0.03)
Troponin I: 0.07 ng/mL (ref <0.03)
Troponin I: 0.4 ng/mL (ref <0.03)

## 2016-01-29 LAB — GLUCOSE, CAPILLARY
GLUCOSE-CAPILLARY: 149 mg/dL — AB (ref 65–99)
Glucose-Capillary: 227 mg/dL — ABNORMAL HIGH (ref 65–99)
Glucose-Capillary: 132 mg/dL — ABNORMAL HIGH (ref 65–99)

## 2016-01-29 LAB — BRAIN NATRIURETIC PEPTIDE: B Natriuretic Peptide: 1091.5 pg/mL — ABNORMAL HIGH (ref 0.0–100.0)

## 2016-01-29 MED ORDER — ALBUTEROL SULFATE (2.5 MG/3ML) 0.083% IN NEBU
2.5000 mg | INHALATION_SOLUTION | Freq: Four times a day (QID) | RESPIRATORY_TRACT | Status: DC
  Administered 2016-01-29 – 2016-01-30 (×4): 2.5 mg via RESPIRATORY_TRACT
  Filled 2016-01-29 (×5): qty 3

## 2016-01-29 MED ORDER — ATENOLOL 50 MG PO TABS
100.0000 mg | ORAL_TABLET | ORAL | Status: DC
  Administered 2016-01-29 – 2016-01-30 (×2): 100 mg via ORAL
  Filled 2016-01-29 (×2): qty 2

## 2016-01-29 MED ORDER — CLONIDINE HCL 0.1 MG PO TABS
0.1000 mg | ORAL_TABLET | Freq: Two times a day (BID) | ORAL | Status: DC
  Administered 2016-01-29 – 2016-02-04 (×13): 0.1 mg via ORAL
  Filled 2016-01-29 (×13): qty 1

## 2016-01-29 MED ORDER — HEPARIN SODIUM (PORCINE) 5000 UNIT/ML IJ SOLN
5000.0000 [IU] | Freq: Three times a day (TID) | INTRAMUSCULAR | Status: DC
  Administered 2016-01-29: 5000 [IU] via SUBCUTANEOUS
  Filled 2016-01-29: qty 1

## 2016-01-29 MED ORDER — FUROSEMIDE 10 MG/ML IJ SOLN
20.0000 mg | Freq: Two times a day (BID) | INTRAMUSCULAR | Status: DC
  Administered 2016-01-29 – 2016-01-30 (×3): 20 mg via INTRAVENOUS
  Filled 2016-01-29 (×3): qty 2

## 2016-01-29 MED ORDER — ALBUTEROL SULFATE (2.5 MG/3ML) 0.083% IN NEBU: 2.5000 mg | INHALATION_SOLUTION | RESPIRATORY_TRACT | Status: DC | PRN

## 2016-01-29 MED ORDER — ONDANSETRON HCL 4 MG PO TABS: 4.0000 mg | ORAL_TABLET | Freq: Four times a day (QID) | ORAL | Status: DC | PRN

## 2016-01-29 MED ORDER — POLYETHYLENE GLYCOL 3350 17 G PO PACK: 17.0000 g | PACK | Freq: Every day | ORAL | Status: DC | PRN

## 2016-01-29 MED ORDER — DEXTROSE 5 % IV SOLN: 500.0000 mg | INTRAVENOUS | Status: DC

## 2016-01-29 MED ORDER — AMLODIPINE BESYLATE 10 MG PO TABS
10.0000 mg | ORAL_TABLET | Freq: Every day | ORAL | Status: DC
  Administered 2016-01-29 – 2016-02-03 (×6): 10 mg via ORAL
  Filled 2016-01-29 (×6): qty 1

## 2016-01-29 MED ORDER — SODIUM CHLORIDE 0.9 % IV SOLN: INTRAVENOUS | Status: DC

## 2016-01-29 MED ORDER — TRAMADOL HCL 50 MG PO TABS
50.0000 mg | ORAL_TABLET | Freq: Three times a day (TID) | ORAL | Status: DC | PRN
  Administered 2016-01-30 – 2016-02-01 (×3): 50 mg via ORAL
  Filled 2016-01-29 (×3): qty 1

## 2016-01-29 MED ORDER — ALBUTEROL SULFATE (2.5 MG/3ML) 0.083% IN NEBU
2.5000 mg | INHALATION_SOLUTION | RESPIRATORY_TRACT | Status: DC | PRN
  Filled 2016-01-29: qty 3

## 2016-01-29 MED ORDER — ONDANSETRON HCL 4 MG/2ML IJ SOLN
4.0000 mg | Freq: Once | INTRAMUSCULAR | Status: AC
  Administered 2016-01-29: 4 mg via INTRAVENOUS
  Filled 2016-01-29: qty 2

## 2016-01-29 MED ORDER — SIMVASTATIN 20 MG PO TABS
40.0000 mg | ORAL_TABLET | Freq: Every day | ORAL | Status: DC
  Administered 2016-01-29 – 2016-02-03 (×6): 40 mg via ORAL
  Filled 2016-01-29 (×4): qty 1
  Filled 2016-01-29: qty 2
  Filled 2016-01-29: qty 1

## 2016-01-29 MED ORDER — HYDRALAZINE HCL 20 MG/ML IJ SOLN
10.0000 mg | Freq: Four times a day (QID) | INTRAMUSCULAR | Status: DC | PRN
  Administered 2016-01-30 – 2016-02-04 (×3): 10 mg via INTRAVENOUS
  Filled 2016-01-29 (×2): qty 1

## 2016-01-29 MED ORDER — INSULIN ASPART 100 UNIT/ML ~~LOC~~ SOLN
0.0000 [IU] | Freq: Three times a day (TID) | SUBCUTANEOUS | Status: DC
  Administered 2016-01-29: 3 [IU] via SUBCUTANEOUS
  Administered 2016-01-30 – 2016-01-31 (×3): 1 [IU] via SUBCUTANEOUS
  Administered 2016-02-01: 2 [IU] via SUBCUTANEOUS
  Administered 2016-02-01 – 2016-02-04 (×3): 1 [IU] via SUBCUTANEOUS

## 2016-01-29 MED ORDER — AZITHROMYCIN 500 MG IV SOLR
500.0000 mg | INTRAVENOUS | Status: DC
  Administered 2016-01-30 – 2016-02-02 (×4): 500 mg via INTRAVENOUS
  Filled 2016-01-29 (×4): qty 500

## 2016-01-29 MED ORDER — DEXTROSE 5 % IV SOLN
500.0000 mg | Freq: Once | INTRAVENOUS | Status: AC
  Administered 2016-01-29: 500 mg via INTRAVENOUS
  Filled 2016-01-29: qty 500

## 2016-01-29 MED ORDER — ONDANSETRON HCL 4 MG/2ML IJ SOLN: 4.0000 mg | Freq: Four times a day (QID) | INTRAMUSCULAR | Status: DC | PRN

## 2016-01-29 MED ORDER — MAGNESIUM SULFATE 2 GM/50ML IV SOLN
2.0000 g | Freq: Once | INTRAVENOUS | Status: AC
  Administered 2016-01-29: 2 g via INTRAVENOUS

## 2016-01-29 MED ORDER — DEXTROSE 5 % IV SOLN: 1.0000 g | INTRAVENOUS | Status: DC

## 2016-01-29 MED ORDER — FUROSEMIDE 10 MG/ML IJ SOLN
40.0000 mg | Freq: Once | INTRAMUSCULAR | Status: AC
  Administered 2016-01-29: 40 mg via INTRAVENOUS
  Filled 2016-01-29: qty 4

## 2016-01-29 MED ORDER — ACETAMINOPHEN 650 MG RE SUPP: 650.0000 mg | Freq: Four times a day (QID) | RECTAL | Status: DC | PRN

## 2016-01-29 MED ORDER — MAGNESIUM SULFATE 2 GM/50ML IV SOLN: 2.0000 g | Freq: Once | INTRAVENOUS | Status: DC

## 2016-01-29 MED ORDER — ACETAMINOPHEN 325 MG PO TABS: 650.0000 mg | ORAL_TABLET | Freq: Four times a day (QID) | ORAL | Status: DC | PRN

## 2016-01-29 MED ORDER — ASPIRIN EC 81 MG PO TBEC
81.0000 mg | DELAYED_RELEASE_TABLET | Freq: Every day | ORAL | Status: DC
  Administered 2016-01-29 – 2016-02-04 (×7): 81 mg via ORAL
  Filled 2016-01-29 (×7): qty 1

## 2016-01-29 MED ORDER — CLOPIDOGREL BISULFATE 75 MG PO TABS
75.0000 mg | ORAL_TABLET | Freq: Every day | ORAL | Status: DC
  Administered 2016-01-29 – 2016-01-30 (×2): 75 mg via ORAL
  Filled 2016-01-29 (×2): qty 1

## 2016-01-29 MED ORDER — DEXTROSE 5 % IV SOLN
1.0000 g | INTRAVENOUS | Status: DC
  Administered 2016-01-30 – 2016-02-02 (×4): 1 g via INTRAVENOUS
  Filled 2016-01-29 (×4): qty 10

## 2016-01-29 MED ORDER — DEXTROSE 5 % IV SOLN
1.0000 g | Freq: Once | INTRAVENOUS | Status: AC
  Administered 2016-01-29: 1 g via INTRAVENOUS
  Filled 2016-01-29: qty 10

## 2016-01-29 MED ORDER — HEPARIN (PORCINE) IN NACL 100-0.45 UNIT/ML-% IJ SOLN
950.0000 [IU]/h | INTRAMUSCULAR | Status: DC
  Administered 2016-01-29: 700 [IU]/h via INTRAVENOUS
  Administered 2016-01-30: 950 [IU]/h via INTRAVENOUS
  Filled 2016-01-29 (×2): qty 250

## 2016-01-29 NOTE — ED Notes (Signed)
Pt used bedside commode to provide UA sample -- pt responded well to transfer from bed to commode. No distress noted.

## 2016-01-29 NOTE — ED Provider Notes (Signed)
[facility] DEPT Provider Note   CSN: [id number] Arrival date & time: 01/29/16  0214  By signing my name below, I, [name], attest that this documentation has been prepared under the direction and in the presence of [name], MD . Electronically Signed: [name], Scribe. 01/29/2016. 2:30 AM.  History   Chief Complaint Chief Complaint  Patient presents with  . Shortness of Breath   The history is provided by the patient and the EMS personnel. No language interpreter was used.   HPI Comments: Kristen Bradley is a 70 y.o. female with PMHx of DM Hepatitis C, HTN and stroke, brought in by EMS to the Emergency Department for an evaluation of persistent, shortness of breath that began ~1 hour PTA. EMS reports pt initially called [facility] complaining of shortness of breath and "difficulty catching her breath." [facility] reports O2 sat 87% on RA before calling EMS. EMS reports C-Pap was administered but was not tolerated before pt was given 15 mg aAbuterol, 1 mg Atrovent and 125 mg Solumedrol en route to ED. Pt's O2 sat upon arrival in the ED was 91%. Pt states this is the firs time she has experienced similar symptoms. She denies fever, cough, and hx of asthma or COPD.   Past Medical History:  Diagnosis Date  . Breast cancer ([place])    s/p lumpectomy, XRT  . Carotid artery disease ([place])   . Coronary artery disease    1 stent   . Diabetes mellitus    takes Metformin daily  . Hepatitis C   . High cholesterol    takes Lopid daily  . History of UTI    takes Diflucan dail--pt states no uti in a couple of yrs though  . Hypertension    takes Losartan,Amlodipine,HCTZ,and Atenolol daily and Clatarpres  . Joint pain    legs  . Renal artery stenosis ([facility])    Right renal artery stent 05/31/06  . Stroke [facility]) Jan. 7, 2014  . Tobacco abuse     Patient Active Problem List   Diagnosis Date Noted  . Solitary pulmonary nodule 08/10/2013  . Atherosclerosis of native arteries of the extremities  with intermittent claudication 01/26/2013  . Pain in limb 09/08/2012  . Aftercare following surgery of the circulatory system, [place] 05/26/2012  . Occlusion and stenosis of carotid artery without mention of cerebral infarction 12/17/2011  . PAD (peripheral artery disease) ([place]) 11/26/2011  . Peripheral vascular disease, unspecified 11/05/2011  . Intermittent claudication ([place]) 11/05/2011  . Carotid stenosis, bilateral 11/05/2011  . Carotid stenosis 03/12/2011    Past Surgical History:  Procedure Laterality Date  . ABDOMINAL HYSTERECTOMY    . ADRENAL GLAND SURGERY    . BACK SURGERY    . BREAST LUMPECTOMY     right  . CAROTID ENDARTERECTOMY    . CORONARY ANGIOPLASTY  2008   1 stent  . ENDARTERECTOMY  01/05/2012   Procedure: ENDARTERECTOMY CAROTID;  Surgeon: Conrad Elnora, MD;  Location: [facility];  Service: Vascular;  Laterality: Right;  Right Carotid Artery Endarterectomy, right stump pressure, intraoperative ultrasound  . ENDARTERECTOMY  02/08/2012   Procedure: ENDARTERECTOMY CAROTID;  Surgeon: Conrad North Braddock, MD;  Location: [facility];  Service: Vascular;  Laterality: Left;  . PATCH ANGIOPLASTY  02/08/2012   Procedure: PATCH ANGIOPLASTY;  Surgeon: Conrad Bellefonte, MD;  Location[facility] OR;  Service: Vascular;  Laterality: Left;  Carotid artery patch angioplasty using Vascu-Guard bovine patch 1cm x 6cm.  . SPINE SURGERY  OB History    No data available     Home Medications    Prior to Admission medications   Medication Sig Start Date End Date Taking? Authorizing Provider  amLODipine (NORVASC) 10 MG tablet Take 10 mg by mouth at bedtime.      Historical Provider, MD  aspirin EC 81 MG tablet Take 81 mg by mouth daily.      Historical Provider, MD  atenolol (TENORMIN) 100 MG tablet Take 100 mg by mouth every morning.      Historical Provider, MD  cilostazol (PLETAL) 100 MG tablet Take 1 tablet (100 mg total) by mouth 2 (two) times daily before a meal. 09/19/15   Sharmon Leyden Nickel, NP  cilostazol  (PLETAL) 100 MG tablet Take 1 tablet (100 mg total) by mouth 2 (two) times daily before a meal. 09/22/15   Sharmon Leyden Nickel, NP  cloNIDine (CATAPRES) 0.1 MG tablet Take 0.1 mg by mouth 2 (two) times daily.      Historical Provider, MD  clopidogrel (PLAVIX) 75 MG tablet Take 1 tablet (75 mg total) by mouth daily. 01/05/12   Regina J Roczniak, PA-C  gemfibrozil (LOPID) 600 MG tablet Take 600 mg by mouth 2 (two) times daily.      Historical Provider, MD  hydrochlorothiazide (HYDRODIURIL) 12.5 MG tablet Take 12.5 mg by mouth daily.  02/07/12   Historical Provider, MD  losartan (COZAAR) 100 MG tablet Take 100 mg by mouth daily.  12/09/11   Historical Provider, MD  metFORMIN (GLUCOPHAGE) 500 MG tablet Take 500 mg by mouth 2 (two) times daily with a meal.      Historical Provider, MD  omeprazole (PRILOSEC) 40 MG capsule  01/14/14   Historical Provider, MD  PRODIGY NO CODING BLOOD GLUC test strip 3 (three) times daily.  03/08/12   Historical Provider, MD  PRODIGY TWIST TOP LANCETS 28G MISC 3 (three) times daily.  12/17/11   Historical Provider, MD  simvastatin (ZOCOR) 40 MG tablet Take 40 mg by mouth daily.  01/12/12   Historical Provider, MD  traMADol (ULTRAM) 50 MG tablet Take 1 tablet (50 mg total) by mouth every 6 (six) hours as needed. 02/09/12   Ulyses Amor, PA-C    Family History Family History  Problem Relation Age of Onset  . Cancer Mother     ? type  . Cancer Father     ? type  . Diabetes Sister   . Hypertension Sister   . Diabetes Brother   . Hypertension Brother   . CAD Neg Hx     Social History Social History  Substance Use Topics  . Smoking status: Current Some Day Smoker    Packs/day: 0.25    Years: 50.00    Types: Cigarettes    Last attempt to quit: 12/29/2011  . Smokeless tobacco: Never Used     Comment: pt states she only smokes 2-3 cigs per day  . Alcohol use No     Allergies   Chlorhexidine gluconate; Ace inhibitors; and Dilaudid [hydromorphone hcl]   Review of  Systems Review of Systems  Constitutional: Negative for fever.  Respiratory: Positive for shortness of breath. Negative for cough.   All other systems reviewed and are negative.  Physical Exam Updated Vital Signs BP 149/62 (BP Location: Right Arm)   Pulse 86   Temp 97.4 F (36.3 C) (Axillary)   Resp 22   Ht 5\' 5"  (1.651 m)   Wt 125 lb (56.7 kg)   SpO2 99%  BMI 20.80 kg/m   Physical Exam  Constitutional: She is oriented to person, place, and time. She appears well-developed and well-nourished. She appears distressed.  HENT:  Head: Normocephalic and atraumatic.  Eyes: EOM are normal.  Neck: Normal range of motion.  Cardiovascular: Normal rate, regular rhythm and normal heart sounds.   Pulmonary/Chest: Effort normal and breath sounds normal.  Tachypnea accesory muscle use. Diffuse rales.   Abdominal: Soft. She exhibits no distension. There is no tenderness.  Musculoskeletal: Normal range of motion. She exhibits no edema.  No edema in legs.   Neurological: She is alert and oriented to person, place, and time.  Skin: Skin is warm. She is diaphoretic.  Psychiatric: She has a normal mood and affect. Judgment normal.  Nursing note and vitals reviewed.  ED Treatments / Results  Labs (all labs ordered are listed, but only abnormal results are displayed) Labs Reviewed  CBC - Abnormal; Notable for the following:       Result Value   WBC 12.4 (*)    Platelets 148 (*)    All other components within normal limits  COMPREHENSIVE METABOLIC PANEL - Abnormal; Notable for the following:    Potassium 3.2 (*)    Glucose, Bld 182 (*)    Creatinine, Ser 1.66 (*)    Calcium 11.1 (*)    GFR calc non Af Amer 30 (*)    GFR calc Af Amer 35 (*)    All other components within normal limits  TROPONIN I - Abnormal; Notable for the following:    Troponin I 0.07 (*)    All other components within normal limits  I-STAT ARTERIAL BLOOD GAS, ED - Abnormal; Notable for the following:    pH,  Arterial 7.330 (*)    pO2, Arterial 138.0 (*)    All other components within normal limits  BRAIN NATRIURETIC PEPTIDE    EKG  EKG Interpretation  Date/Time:  Thursday January 29 2016 02:58:34 EST Ventricular Rate:  69 PR Interval:    QRS Duration: 170 QT Interval:  503 QTC Calculation: 539 R Axis:   -167 Text Interpretation:  Sinus rhythm Probable left atrial enlargement Consider left ventricular hypertrophy Borderline T abnormalities, lateral leads Prolonged QT interval Left bundle branch block nonspecific changes Confirmed by Aishi Courts  MD, Reham Slabaugh ([id number]) on 01/29/2016 3:47:50 AM       Radiology Dg Chest Portable 1 View  Result Date: 01/29/2016 CLINICAL DATA:  Shortness of breath EXAM: PORTABLE CHEST 1 VIEW COMPARISON:  Chest CT 08/16/2013 FINDINGS: There is atherosclerotic calcification in the aortic arch. Cardiomediastinal contours are otherwise normal. There is increased airspace opacity in the right lower lobe. No pleural effusion or pneumothorax. IMPRESSION: Increased opacity in the right lower lobe is concerning for pneumonia. Electronically Signed   By: Ulyses Jarred M.D.   On: 01/29/2016 02:50    Procedures Procedures (including critical care time)   ++++++++++++++++++++++++++++++++++++++++++++++++++  CRITICAL CARE Performed by: Hoy Morn Total critical care time: 31 minutes Critical care time was exclusive of separately billable procedures and treating other patients. Critical care was necessary to treat or prevent imminent or life-threatening deterioration. Critical care was time spent personally by me on the following activities: development of treatment plan with patient and/or surrogate as well as nursing, discussions with consultants, evaluation of patient's response to treatment, examination of patient, obtaining history from patient or surrogate, ordering and performing treatments and interventions, ordering and review of laboratory studies, ordering and  review of radiographic studies, pulse oximetry and re-evaluation of  patient's condition.  ++++++++++++++++++++++++++++++++++++++    Medications Ordered in ED Medications  albuterol (PROVENTIL) (2.5 MG/3ML) 0.083% nebulizer solution 2.5 mg (not administered)  cefTRIAXone (ROCEPHIN) 1 g in dextrose 5 % 50 mL IVPB (not administered)  azithromycin (ZITHROMAX) 500 mg in dextrose 5 % 250 mL IVPB (not administered)  furosemide (LASIX) injection 40 mg (not administered)  magnesium sulfate IVPB 2 g 50 mL (0 g Intravenous Stopped 01/29/16 0252)    DIAGNOSTIC STUDIES:  Oxygen Saturation is 99% on N/C, low by my interpretation.    COORDINATION OF CARE:  2:18 AM Discussed treatment plan with pt at bedside and pt agreed to plan.  Initial Impression / Assessment and Plan / ED Course  I have reviewed the triage vital signs and the nursing notes.  Pertinent labs & imaging results that were available during my care of the patient were reviewed by me and considered in my medical decision making (see chart for details).  Clinical Course     Acute respiratory failure. Requiring bipap on arrival. Labs, xray, abg pending  4:15 AM Pt looks much more comfortable. Unilateral edema versus infiltrate. Will cover for both. Improving in ER. Admit to stepdown. Covered with abx.   I personally performed the services described in this documentation, which was scribed in my presence. The recorded information has been reviewed and is accurate.      Final Clinical Impressions(s) / ED Diagnoses   Final diagnoses:  Acute respiratory failure, unspecified whether with hypoxia or hypercapnia [facility])    New Prescriptions New Prescriptions   No medications on file     [name], MD 01/29/16 [phone]

## 2016-01-29 NOTE — Progress Notes (Signed)
Pending admission per Dr. Venora Maples  70 year old lady with past medical history of stroke, tobacco abuse, HCV, breast cancer, hypertension, hyperlipidemia, diabetes mellitus, dCHF (EF 60% with grade 1 diastolic dysfunction by 2-D echo on 12/07/11), who presents with severe respiratory distress with oxygen desaturation to 87%. Patient does not have cough, chest pain or fever. Has mild leukocytosis with WBC 12.4. X-ray reports possible right lower lobe pneumonia, but clinically patient does not seem to have typical pneumonia per EDP. Hold of Abx, pending BNP. If BNP is significantly elevated, will indicate pulmonary edema. Pt is accepted to SDU. Pt is on BiPAP.  Ivor Costa, MD  [facility] Pager [phone]  If 7PM-7AM, please contact night-coverage [email or web address] Password [email or web address] 01/29/2016, 4:15 AM

## 2016-01-29 NOTE — Progress Notes (Signed)
CRITICAL VALUE ALERT  Critical value received:  Troponin 0.48  Date of notification:  01/29/16  Time of notification:  4709  Critical value read back:Yes.    Nurse who received alert:  Daltin Crist   MD notified (1st page):  Marily Memos  Time of first page:  1631  MD notified (2nd page):  Time of second page:  Responding MD:    Time MD responded:

## 2016-01-29 NOTE — ED Notes (Signed)
Pt requesting water and her metformin -- Santiago Glad, RN notified about meds & This NT gave water to pt.

## 2016-01-29 NOTE — Progress Notes (Signed)
Received report from [facility]

## 2016-01-29 NOTE — Progress Notes (Signed)
Pt removed from bipap at this time per MD and placed on 6lpm [place].  RT will monitor.

## 2016-01-29 NOTE — Progress Notes (Signed)
ANTICOAGULATION CONSULT NOTE - Initial Consult  Pharmacy Consult for Heparin Indication: chest pain/ACS  Allergies  Allergen Reactions  . Chlorhexidine Gluconate Itching  . Ace Inhibitors Cough  . Dilaudid [Hydromorphone Hcl] Itching    Patient Measurements: Height: 5\' 5"  (165.1 cm) Weight: 130 lb 4.7 oz (59.1 kg) IBW/kg (Calculated) : 57 Heparin Dosing Weight: 59 kg  Vital Signs: Temp: 98.2 F (36.8 C) (12/28 1127) Temp Source: Oral (12/28 1127) BP: 161/62 (12/28 1127) Pulse Rate: 75 (12/28 1127)  Labs:  Recent Labs  01/29/16 0229 01/29/16 0823 01/29/16 1456  HGB 14.5 12.1  --   HCT 45.6 37.2  --   PLT 148* 77*  --   CREATININE 1.66* 1.87*  --   TROPONINI 0.07*  --  0.48*    Estimated Creatinine Clearance: 25.5 mL/min (by C-G formula based on SCr of 1.87 mg/dL (H)).   Medical History: Past Medical History:  Diagnosis Date  . Cancer of right breast [facility])    s/p lumpectomy, XRT  . Carotid artery disease ([place])   . Coronary artery disease    1 stent   . Hepatitis C    "got treatment for it" (01/29/2016)  . High cholesterol    takes Lopid daily  . History of UTI    takes Diflucan dail--pt states no uti in a couple of yrs though  . Hypertension    takes Losartan,Amlodipine,HCTZ,and Atenolol daily and Clatarpres  . Joint pain    legs  . Renal artery stenosis ([facility])    Right renal artery stent 05/31/06  . Stroke [facility]) Jan. 7, 2014  . Tobacco abuse   . Type II diabetes mellitus ([place])     Assessment: 41 YOF who presented on 12/28 with SOB and was started on antibiotics for CAP. Work-up revealed elevated troponins and pharmacy was consulted to start Heparin for anticoagulation.   The patient has known thrombocytopenia with the usual range appearing to be 120-140s. The platelets are noted to be 77 on recheck this AM. Confirmed with MD to continue with full-dose heparin. Will not bolus and will dose conservatively. If troponins remain stable or decrease - plans  are likely to discontinue the heparin drip.   Goal of Therapy:  Heparin level 0.3-0.7 units/ml Monitor platelets by anticoagulation protocol: Yes   Plan:  1. Start heparin at 700 units/hr (7 ml/hr) 2. Daily HL, CBC 3. Will continue to monitor for any signs/symptoms of bleeding and will follow up with heparin level in 8 hours   Thank you for allowing pharmacy to be a part of this patient's care.  Alycia Rossetti, PharmD, BCPS Clinical Pharmacist Pager: [phone] 01/29/2016 5:41 PM

## 2016-01-29 NOTE — H&P (Signed)
History and Physical    Kristen Bradley LKJ:[id number] DOB: [birth year] DOA: 01/29/2016  PCP: Antony Blackbird, MD Patient coming from:   Chief Complaint: SOB  HPI: Kristen Bradley is a 70 y.o. female with medical history significant of HTN, HLD, DM type II, hep C, PVD with carotid artery and renal artery stenosis, CAD with previous PCI, tobacco abuse who presented to the ED with acute onset SOB. Patient reported being in her usual state of health until last night when she abruptly developed acute onset of progressive SOB. Patient clearly a mass and when they arrived her oxygen saturation was down to 87% on ambient air.   ED Course: She was delivered to the emergency department, immediately placed on BiPAP, which she was not tolerating quite well. Patient was given albuterol and Atrovent as well as  IV Solu-Medrol and    IV Lasix. Eventually she was taken off BiPAP and during my assessment reporting significant improvement in breathing and being able to speak full sentences without dyspnea She denies fever, cough, and hx of asthma or COPD and said that quit smoking three days ago after being a life time smoker. In the emergency department on 3 showed sinus rhythm with occasional runs of PVCs, but patient remained stable hemodynamically, maintaining her blood pressure and heart rate controlled   blood work showed elevated white blood cells count at 12,400, she had mildly elevated troponin at 0.07 and BNP greater than 1000 She also had elevated calcium at 11.1 and hypokalemia at 3.2.  Review of Systems: As per HPI otherwise 10 point review of systems negative.   Ambulatory Status: independent Past Medical History:  Diagnosis Date  . Breast cancer ([place])    s/p lumpectomy, XRT  . Carotid artery disease ([place])   . Coronary artery disease    1 stent   . Diabetes mellitus    takes Metformin daily  . Hepatitis C   . High cholesterol    takes Lopid daily  . History of UTI    takes Diflucan dail--pt  states no uti in a couple of yrs though  . Hypertension    takes Losartan,Amlodipine,HCTZ,and Atenolol daily and Clatarpres  . Joint pain    legs  . Renal artery stenosis (HCC)    Right renal artery stent 05/31/06  . Stroke [facility]) Jan. 7, 2014  . Tobacco abuse     Past Surgical History:  Procedure Laterality Date  . ABDOMINAL HYSTERECTOMY    . ADRENAL GLAND SURGERY    . BACK SURGERY    . BREAST LUMPECTOMY     right  . CAROTID ENDARTERECTOMY    . CORONARY ANGIOPLASTY  2008   1 stent  . ENDARTERECTOMY  01/05/2012   Procedure: ENDARTERECTOMY CAROTID;  Surgeon: Conrad Vallejo, MD;  Location: [place];  Service: Vascular;  Laterality: Right;  Right Carotid Artery Endarterectomy, right stump pressure, intraoperative ultrasound  . ENDARTERECTOMY  02/08/2012   Procedure: ENDARTERECTOMY CAROTID;  Surgeon: Conrad [address] [place], MD;  Location: [place];  Service: Vascular;  Laterality: Left;  . PATCH ANGIOPLASTY  02/08/2012   Procedure: PATCH ANGIOPLASTY;  Surgeon: Conrad Polson, MD;  Location[facility] OR;  Service: Vascular;  Laterality: Left;  Carotid artery patch angioplasty using Vascu-Guard bovine patch 1cm x 6cm.  . SPINE SURGERY      Social History   Social History  . Marital status: Divorced    Spouse name: N/A  . Number of children: 1  . Years of education: N/A  Occupational History  . Retired-truck driver Retired   Social History Main Topics  . Smoking status: Current Some Day Smoker    Packs/day: 0.25    Years: 50.00    Types: Cigarettes    Last attempt to quit: 12/29/2011  . Smokeless tobacco: Never Used     Comment: pt states she only smokes 2-3 cigs per day  . Alcohol use No  . Drug use: No  . Sexual activity: Yes    Birth control/ protection: Surgical   Other Topics Concern  . Not on file   Social History Narrative  . No narrative on file   Family history significant for coronary artery disease and diabetes mellitus.  Allergies  Allergen Reactions  . Chlorhexidine Gluconate  Itching  . Ace Inhibitors Cough  . Dilaudid [Hydromorphone Hcl] Itching    Family History  Problem Relation Age of Onset  . Cancer Mother     ? type  . Cancer Father     ? type  . Diabetes Sister   . Hypertension Sister   . Diabetes Brother   . Hypertension Brother   . CAD Neg Hx     Prior to Admission medications   Medication Sig Start Date End Date Taking? Authorizing Provider  amLODipine (NORVASC) 10 MG tablet Take 10 mg by mouth at bedtime.      Historical Provider, MD  aspirin EC 81 MG tablet Take 81 mg by mouth daily.      Historical Provider, MD  atenolol (TENORMIN) 100 MG tablet Take 100 mg by mouth every morning.      Historical Provider, MD  cilostazol (PLETAL) 100 MG tablet Take 1 tablet (100 mg total) by mouth 2 (two) times daily before a meal. 09/19/15   Sharmon Leyden Nickel, NP  cilostazol (PLETAL) 100 MG tablet Take 1 tablet (100 mg total) by mouth 2 (two) times daily before a meal. 09/22/15   Sharmon Leyden Nickel, NP  cloNIDine (CATAPRES) 0.1 MG tablet Take 0.1 mg by mouth 2 (two) times daily.      Historical Provider, MD  clopidogrel (PLAVIX) 75 MG tablet Take 1 tablet (75 mg total) by mouth daily. 01/05/12   Regina J Roczniak, PA-C  gemfibrozil (LOPID) 600 MG tablet Take 600 mg by mouth 2 (two) times daily.      Historical Provider, MD  hydrochlorothiazide (HYDRODIURIL) 12.5 MG tablet Take 12.5 mg by mouth daily.  02/07/12   Historical Provider, MD  losartan (COZAAR) 100 MG tablet Take 100 mg by mouth daily.  12/09/11   Historical Provider, MD  metFORMIN (GLUCOPHAGE) 500 MG tablet Take 500 mg by mouth 2 (two) times daily with a meal.      Historical Provider, MD  omeprazole (PRILOSEC) 40 MG capsule  01/14/14   Historical Provider, MD  PRODIGY NO CODING BLOOD GLUC test strip 3 (three) times daily.  03/08/12   Historical Provider, MD  PRODIGY TWIST TOP LANCETS 28G MISC 3 (three) times daily.  12/17/11   Historical Provider, MD  simvastatin (ZOCOR) 40 MG tablet Take 40 mg by mouth  daily.  01/12/12   Historical Provider, MD  traMADol (ULTRAM) 50 MG tablet Take 1 tablet (50 mg total) by mouth every 6 (six) hours as needed. 02/09/12   Ulyses Amor, PA-C    Physical Exam: Vitals:   01/29/16 0330 01/29/16 0400 01/29/16 0430 01/29/16 0700  BP: 152/66 149/57 (!) 163/54 159/68  Pulse: 64 67 69 75  Resp: 24 20 17 17   Temp:  TempSrc:      SpO2: 100% 100% 100% 99%  Weight:      Height:         General: Appears calm and comfortable Eyes: PERRLA, EOMI, normal lids, Maki ENT:  grossly normal hearing, lips & tongue, mucous membranes moist and intact Neck: no lymphoadenopathy, masses or thyromegaly Cardiovascular: RRR, no m/r/g. No JVD, carotid bruits. No LE edema.  Respiratory: bilateral no wheezes , rhonchi,  by basilar crackles with right greater than left. Normal respiratory effort. No accessory muscle use observed Abdomen: soft, non-tender, non-distended, no organomegaly or masses appreciated. BS present in all quadrants Skin: no rash, ulcers or induration seen on limited exam Musculoskeletal: grossly normal tone BUE/BLE, good ROM, no bony abnormality or joint deformities observed Psychiatric: grossly normal mood and affect, speech fluent and appropriate, alert and oriented x3 Neurologic: CN II-XII grossly intact, moves all extremities in coordinated fashion, sensation intact  Labs on Admission: I have personally reviewed following labs and imaging studies  CBC, BMP, troponin, BMP  GFR: Estimated Creatinine Clearance: 28.6 mL/min (by C-G formula based on SCr of 1.66 mg/dL (H)).   Creatinine Clearance: Estimated Creatinine Clearance: 28.6 mL/min (by C-G formula based on SCr of 1.66 mg/dL (H)).    Radiological Exams on Admission: Dg Chest Portable 1 View  Result Date: 01/29/2016 CLINICAL DATA:  Shortness of breath EXAM: PORTABLE CHEST 1 VIEW COMPARISON:  Chest CT 08/16/2013 FINDINGS: There is atherosclerotic calcification in the aortic arch.  Cardiomediastinal contours are otherwise normal. There is increased airspace opacity in the right lower lobe. No pleural effusion or pneumothorax. IMPRESSION: Increased opacity in the right lower lobe is concerning for pneumonia. Electronically Signed   By: Ulyses Jarred M.D.   On: 01/29/2016 02:50    EKG: Independently reviewed - sinus bradycardia with bundle branch block,  Assessment/Plan Principal Problem:   Acute respiratory failure (HCC) Active Problems:   Peripheral vascular disease ([place])   Community acquired pneumonia   Hypertension   Diabetes mellitus type 2 in nonobese ([place])   Acute kidney injury ([place])    Acute respiratory failure associated with community-acquired pneumonia Blood cultures and sputum cultures ordered Patient received a dose of Rocephin and azithromycin in the ED and this will be continued Continue scheduled and prn nebulizer Supplemental oxygen as needed  Acute kidney injury - could be associated with progression of renal vascular disease Patient received 40 mg of Lasix IV 1 in the ED, hold home oral diuretic Given patient's elevated BNP will refrain from IV hydration at this point; repeat echocardiogram-last time in 2013 her LV EF was 60% Follow-up renal indices and adjust medications to renal doses when possible, avoid nephrotoxic drugs   DM type II  Check HgbA1c Hold Metformin while hospitalized Continue diabetic diet, monitor FSBS QACHS, add sliding scale insulin  Hypertension - currently stable Continue home medication and adjust the doses if needed depending on the BP readings  Hyperlipidemia Continue statin therapy-Zocor   DVT prophylaxis: Heparin  Code Status: Full  Family Communication: spoke with patient's son at bedside Plan: Telemetry  Consults called: None  Admission status: Inpatient   York Grice, [place] Pager: [phone] [facility]  If 7PM-7AM, please contact night-coverage [email or web address] Password  [email or web address]  01/29/2016, 8:50 AM

## 2016-01-29 NOTE — Progress Notes (Signed)
   Elevated troponin noted. Discussed with cardiology. EKG ordered. Started on heparin. May be secondary to cardiac strain due to acute processes outlined in H&P. Will repeat troponin and if continues to climb will need cardiology to evaluate emergently.   Linna Darner, MD [facility] 01/29/2016, 5:18 PM

## 2016-01-29 NOTE — Progress Notes (Signed)
Kristen Bradley [id number] Admission Data: 01/29/2016 3:02 PM Attending Provider: Waldemar Dickens, MD  TGP:[id number],RAXENM KELLER, MD Consults/ Treatment Team:   Kristen Bradley is a 70 y.o. female patient admitted from ED awake, alert  & orientated  X 3,  Full Code, VSS - Blood pressure (!) 161/62, pulse 75, temperature 98.2 F (36.8 C), temperature source Oral, resp. rate 18, height 5\' 5"  (1.651 m), weight 59.1 kg (130 lb 4.7 oz), SpO2 94 %., O2    3 L nasal cannular, no c/o shortness of breath, no c/o chest pain, no distress noted. Tele # 14 placed and pt is currently running:normal sinus rhythm.   IV site WDL:  antecubital right, condition patent and no redness and left, condition patent and no redness with a transparent dsg that's clean dry and intact.  Allergies:   Allergies  Allergen Reactions  . Chlorhexidine Gluconate Itching  . Ace Inhibitors Cough  . Dilaudid [Hydromorphone Hcl] Itching     Past Medical History:  Diagnosis Date  . Cancer of right breast [facility])    s/p lumpectomy, XRT  . Carotid artery disease ([place])   . Coronary artery disease    1 stent   . Hepatitis C    "got treatment for it" (01/29/2016)  . High cholesterol    takes Lopid daily  . History of UTI    takes Diflucan dail--pt states no uti in a couple of yrs though  . Hypertension    takes Losartan,Amlodipine,HCTZ,and Atenolol daily and Clatarpres  . Joint pain    legs  . Renal artery stenosis (HCC)    Right renal artery stent 05/31/06  . Stroke [facility]) Jan. 7, 2014  . Tobacco abuse   . Type II diabetes mellitus ([place])     History:  obtained from the patient.  Pt orientation to unit, room and routine. Information packet given to patient/family and safety video watched.  Admission INP armband ID verified with patient/family, and in place. SR up x 2, fall risk assessment complete with Patient and family verbalizing understanding of risks associated with falls. Pt verbalizes an understanding of how to use the  call bell and to call for help before getting out of bed.  Skin, clean-dry- intact without evidence of bruising, or skin tears.   No evidence of skin break down noted on exam. no rashes, no ecchymoses, no petechiae    Will cont to monitor and assist as needed.  Bernardo Brayman Margaretha Sheffield, RN 01/29/2016 3:02 PM

## 2016-01-29 NOTE — ED Triage Notes (Signed)
Patient arrived via EMS with SOB.  Patient told fire she woke up SOB with difficulty catching her breath.  Fire reported initial sat 87% RA.  EMS adm 15mg  Albuterol, 1mg  Atrovent, 125mg  Solumedrol stating her sats got to 91% once.  BP 190/106

## 2016-01-29 NOTE — ED Notes (Signed)
Pt placed on 6L nasal cannula, tolerating O2 with oxygen saturations at 99%, denies SOB

## 2016-01-29 NOTE — ED Notes (Signed)
Pt's son at bedside-- Leeandra Ellerson --- H [phone]                            C [phone]  Please call with any changes/concerns with pt.

## 2016-01-30 ENCOUNTER — Inpatient Hospital Stay (HOSPITAL_COMMUNITY): Payer: Commercial Managed Care - HMO

## 2016-01-30 ENCOUNTER — Encounter (HOSPITAL_COMMUNITY): Payer: Self-pay | Admitting: Cardiology

## 2016-01-30 DIAGNOSIS — D696 Thrombocytopenia, unspecified: Secondary | ICD-10-CM

## 2016-01-30 DIAGNOSIS — I509 Heart failure, unspecified: Secondary | ICD-10-CM

## 2016-01-30 DIAGNOSIS — I5041 Acute combined systolic (congestive) and diastolic (congestive) heart failure: Secondary | ICD-10-CM

## 2016-01-30 DIAGNOSIS — R778 Other specified abnormalities of plasma proteins: Secondary | ICD-10-CM | POA: Diagnosis present

## 2016-01-30 DIAGNOSIS — R7989 Other specified abnormal findings of blood chemistry: Secondary | ICD-10-CM | POA: Diagnosis present

## 2016-01-30 DIAGNOSIS — I6523 Occlusion and stenosis of bilateral carotid arteries: Secondary | ICD-10-CM

## 2016-01-30 DIAGNOSIS — I447 Left bundle-branch block, unspecified: Secondary | ICD-10-CM | POA: Diagnosis present

## 2016-01-30 DIAGNOSIS — I248 Other forms of acute ischemic heart disease: Secondary | ICD-10-CM

## 2016-01-30 DIAGNOSIS — I70211 Atherosclerosis of native arteries of extremities with intermittent claudication, right leg: Secondary | ICD-10-CM

## 2016-01-30 HISTORY — DX: Other specified abnormalities of plasma proteins: R77.8

## 2016-01-30 HISTORY — DX: Other specified abnormal findings of blood chemistry: R79.89

## 2016-01-30 HISTORY — DX: Thrombocytopenia, unspecified: D69.6

## 2016-01-30 HISTORY — DX: Left bundle-branch block, unspecified: I44.7

## 2016-01-30 LAB — GLUCOSE, CAPILLARY
GLUCOSE-CAPILLARY: 135 mg/dL — AB (ref 65–99)
Glucose-Capillary: 133 mg/dL — ABNORMAL HIGH (ref 65–99)
Glucose-Capillary: 144 mg/dL — ABNORMAL HIGH (ref 65–99)
Glucose-Capillary: 128 mg/dL — ABNORMAL HIGH (ref 65–99)

## 2016-01-30 LAB — COMPREHENSIVE METABOLIC PANEL
ALBUMIN: 3.2 g/dL — AB (ref 3.5–5.0)
ALK PHOS: 69 U/L (ref 38–126)
ALT: 14 U/L (ref 14–54)
AST: 20 U/L (ref 15–41)
Anion gap: 10 (ref 5–15)
BILIRUBIN TOTAL: 0.8 mg/dL (ref 0.3–1.2)
BUN: 24 mg/dL — AB (ref 6–20)
CALCIUM: 10.2 mg/dL (ref 8.9–10.3)
CO2: 26 mmol/L (ref 22–32)
Chloride: 101 mmol/L (ref 101–111)
Creatinine, Ser: 1.78 mg/dL — ABNORMAL HIGH (ref 0.44–1.00)
GFR calc Af Amer: 32 mL/min — ABNORMAL LOW (ref >60)
GFR, EST NON AFRICAN AMERICAN: 28 mL/min — AB (ref >60)
GLUCOSE: 124 mg/dL — AB (ref 65–99)
Potassium: 3.3 mmol/L — ABNORMAL LOW (ref 3.5–5.1)
Sodium: 137 mmol/L (ref 135–145)
TOTAL PROTEIN: 6.3 g/dL — AB (ref 6.5–8.1)

## 2016-01-30 LAB — CBC
HCT: 33.2 % — ABNORMAL LOW (ref 36.0–46.0)
Hemoglobin: 11 g/dL — ABNORMAL LOW (ref 12.0–15.0)
MCH: 29 pg (ref 26.0–34.0)
MCHC: 33.1 g/dL (ref 30.0–36.0)
MCV: 87.6 fL (ref 78.0–100.0)
PLATELETS: 81 10*3/uL — AB (ref 150–400)
RBC: 3.79 MIL/uL — ABNORMAL LOW (ref 3.87–5.11)
RDW: 14.4 % (ref 11.5–15.5)
WBC: 6.9 10*3/uL (ref 4.0–10.5)

## 2016-01-30 LAB — ECHOCARDIOGRAM COMPLETE
HEIGHTINCHES: 65 in
Weight: 2060.8 oz

## 2016-01-30 LAB — HEPARIN LEVEL (UNFRACTIONATED)
HEPARIN UNFRACTIONATED: 0.22 [IU]/mL — AB (ref 0.30–0.70)
HEPARIN UNFRACTIONATED: 0.28 [IU]/mL — AB (ref 0.30–0.70)

## 2016-01-30 LAB — D-DIMER, QUANTITATIVE: D-Dimer, Quant: 1.73 ug/mL-FEU — ABNORMAL HIGH (ref 0.00–0.50)

## 2016-01-30 MED ORDER — METOPROLOL SUCCINATE ER 100 MG PO TB24
100.0000 mg | ORAL_TABLET | Freq: Every day | ORAL | Status: DC
Start: 2016-01-30 — End: 2016-01-31
  Administered 2016-01-30: 100 mg via ORAL
  Filled 2016-01-30: qty 1

## 2016-01-30 MED ORDER — POTASSIUM CHLORIDE CRYS ER 20 MEQ PO TBCR
40.0000 meq | EXTENDED_RELEASE_TABLET | Freq: Once | ORAL | Status: AC
  Administered 2016-01-30: 40 meq via ORAL
  Filled 2016-01-30: qty 2

## 2016-01-30 NOTE — Progress Notes (Signed)
PROGRESS NOTE                                                                                                                                                                                                             Patient Demographics:    Kristen Bradley, is a 70 y.o. female, DOB - 1945/10/28, WYO:[id number]  Admit date - 01/29/2016   Admitting Physician Waldemar Dickens, MD  Outpatient Primary MD for the patient is Cammy Copa, MD  LOS - 1  Outpatient Specialists:None  Chief Complaint  Patient presents with  . Shortness of Breath       Brief Narrative  70 year old female with history of hypertension, hyperlipidemia, type 2 diabetes mellitus, hep C, PVD, carotid and the artery stenosis, CAD with prior PCI, tobacco use presented with acute onset of shortness of breath since the night prior to admission. This is progressive her sats were in the 80s when she arrived to the ED. She was placed initially on BiPAP and then transitioned to nasal cannula. She was given nebs as well as IV Solu Medrol and IV Lasix. Patient denied cough, fever, chills, headache, chest pain, palpitation, nausea, vomiting, abdominal pain, bowel or urinary symptoms. Blood will done in the ED showed WBC of 12.4 K elevated troponin of 0.07 which was subsequently further elevated up to 0.48, BNP of 1200 and potassium of 3.2. Patient placed on IV heparin for possible NSTEMI and acute CHF.    Subjective:   Patient informs her breathing to be better. Denies chest pain.   Assessment  & Plan :    Principal Problem:   Acute Hypoxic respiratory failure (HCC) Possibly a combination of acute diastolic CHF and? And NSTEMI. Stable on room air. Continue when necessary nebs and twice a day IV Lasix. Strict I/O and daily weight. Follow 2-D echo. Cardiology consult  Active Problems: NSTEMI Troponin peak at 0.48. Denies chest pain symptoms. CXR without  fluid overload. Unclear if symptoms contributed by acute viral illness. Continue aspirin and IV heparin. Follow 2d echo. Cardiology consulted for further assistance.    ? Lobar pneumonia Seen on CXR. Only symptoms is acute shortness of breath and hypoxia. On empiric Rocephin and azithromycin.  Thrombocytopenia ? Secondary to Acute viral illness. Monitor on IV heparin. Will obtain labs from PCP office.    Peripheral vascular disease ([place])  On plavix and Pletal     Diabetes mellitus type 2 in nonobese (HCC) Metformin held. I sliding cell coverage.    Acute kidney injury ([place]) No recent labs in the system. Patient is on HCTZ and ARB which is held. Also held metformin. Will call PCP to obtain recent labs.  Hypokalemia Replenish     Code Status : full code  Family Communication  : son at bedside  Disposition Plan  : home once improved  Barriers For Discharge : Active symptoms  Consults  : Cardiology  Procedures  : Pending 2-D echo  DVT Prophylaxis  : IV heparin  Lab Results  Component Value Date   PLT 81 (L) 01/30/2016    Antibiotics  :   Anti-infectives    Start     Dose/Rate Route Frequency Ordered Stop   01/30/16 0430  azithromycin (ZITHROMAX) 500 mg in dextrose 5 % 250 mL IVPB     500 mg 250 mL/hr over 60 Minutes Intravenous Every 24 hours 01/29/16 1119 02/05/16 0429   01/29/16 0830  cefTRIAXone (ROCEPHIN) 1 g in dextrose 5 % 50 mL IVPB  Status:  Discontinued     1 g 100 mL/hr over 30 Minutes Intravenous Every 24 hours 01/29/16 0823 01/29/16 1119   01/29/16 0830  azithromycin (ZITHROMAX) 500 mg in dextrose 5 % 250 mL IVPB  Status:  Discontinued     500 mg 250 mL/hr over 60 Minutes Intravenous Every 24 hours 01/29/16 0823 01/29/16 1119   01/29/16 0430  cefTRIAXone (ROCEPHIN) 1 g in dextrose 5 % 50 mL IVPB     1 g 100 mL/hr over 30 Minutes Intravenous Every 24 hours 01/29/16 1119 02/04/16 0429   01/29/16 0415  cefTRIAXone (ROCEPHIN) 1 g in dextrose 5 % 50 mL  IVPB     1 g 100 mL/hr over 30 Minutes Intravenous  Once 01/29/16 0413 01/29/16 0500   01/29/16 0415  azithromycin (ZITHROMAX) 500 mg in dextrose 5 % 250 mL IVPB     500 mg 250 mL/hr over 60 Minutes Intravenous  Once 01/29/16 0413 01/29/16 0530        Objective:   Vitals:   01/29/16 2208 01/30/16 0320 01/30/16 0541 01/30/16 0823  BP:   (!) 145/65   Pulse:   70   Resp:   18   Temp:   98.7 F (37.1 C)   TempSrc:   Oral   SpO2: 97%  96% 97%  Weight:  58.4 kg (128 lb 12.8 oz)    Height:        Wt Readings from Last 3 Encounters:  01/30/16 58.4 kg (128 lb 12.8 oz)  09/19/15 56.7 kg (125 lb)  01/10/15 57 kg (125 lb 11.2 oz)     Intake/Output Summary (Last 24 hours) at 01/30/16 1148 Last data filed at 01/30/16 0900  Gross per 24 hour  Intake           632.17 ml  Output             1100 ml  Net          -467.83 ml     Physical Exam  Gen: not in distress HEENT:  moist mucosa, supple neck Chest: clear b/l, no added sounds CVS: N S1&S2, no murmurs, rubs or gallop GI: soft, NT, ND, BS+ Musculoskeletal: warm, no edema ZGY:FVCBS and oriented, nonfocal    Data Review:    CBC  Recent Labs Lab 01/29/16 0229 01/29/16 0823 01/30/16 0330  WBC 12.4*  6.1 6.9  HGB 14.5 12.1 11.0*  HCT 45.6 37.2 33.2*  PLT 148* 77* 81*  MCV 92.7 88.8 87.6  MCH 29.5 28.9 29.0  MCHC 31.8 32.5 33.1  RDW 15.1 14.5 14.4    Chemistries   Recent Labs Lab 01/29/16 0229 01/29/16 0823 01/30/16 0330  NA 139  --  137  K 3.2*  --  3.3*  CL 103  --  101  CO2 26  --  26  GLUCOSE 182*  --  124*  BUN 18  --  24*  CREATININE 1.66* 1.87* 1.78*  CALCIUM 11.1*  --  10.2  AST 30  --  20  ALT 19  --  14  ALKPHOS 107  --  69  BILITOT 0.6  --  0.8   ------------------------------------------------------------------------------------------------------------------ No results for input(s): CHOL, HDL, LDLCALC, TRIG, CHOLHDL, LDLDIRECT in the last 72 hours.  Lab Results  Component Value Date     HGBA1C 6.9 (H) 02/09/2012   ------------------------------------------------------------------------------------------------------------------  Recent Labs  01/29/16 1148  TSH 0.978   ------------------------------------------------------------------------------------------------------------------ No results for input(s): VITAMINB12, FOLATE, FERRITIN, TIBC, IRON, RETICCTPCT in the last 72 hours.  Coagulation profile No results for input(s): INR, PROTIME in the last 168 hours.  No results for input(s): DDIMER in the last 72 hours.  Cardiac Enzymes  Recent Labs Lab 01/29/16 0229 01/29/16 1456 01/29/16 1931  TROPONINI 0.07* 0.48* 0.40*   ------------------------------------------------------------------------------------------------------------------    Component Value Date/Time   BNP 1,091.5 (H) 01/29/2016 0347    Inpatient Medications  Scheduled Meds: . albuterol  2.5 mg Nebulization Q6H  . amLODipine  10 mg Oral QHS  . aspirin EC  81 mg Oral Daily  . atenolol  100 mg Oral BH-q7a  . azithromycin  500 mg Intravenous Q24H  . cefTRIAXone (ROCEPHIN)  IV  1 g Intravenous Q24H  . cloNIDine  0.1 mg Oral BID  . clopidogrel  75 mg Oral Daily  . furosemide  20 mg Intravenous BID  . insulin aspart  0-9 Units Subcutaneous TID WC  . simvastatin  40 mg Oral QHS   Continuous Infusions: . heparin 850 Units/hr (01/30/16 0445)   PRN Meds:.acetaminophen **OR** acetaminophen, albuterol, hydrALAZINE, ondansetron **OR** ondansetron (ZOFRAN) IV, polyethylene glycol, traMADol  Micro Results Recent Results (from the past 240 hour(s))  Culture, sputum-assessment     Status: None   Collection Time: 01/29/16  8:23 AM  Result Value Ref Range Status   Specimen Description EXPECTORATED SPUTUM  Final   Special Requests NONE  Final   Sputum evaluation   Final    Sputum specimen not acceptable for testing.  Please recollect.   Results Called to: G. GLEASON, RN AT [place] ON 01/29/16 BY C.  JESSUP, MLT.    Report Status 01/29/2016 FINAL  Final  Culture, blood (routine x 2) Call MD if unable to obtain prior to antibiotics being given     Status: None (Preliminary result)   Collection Time: 01/29/16 11:52 AM  Result Value Ref Range Status   Specimen Description BLOOD RIGHT HAND  Final   Special Requests BOTTLES DRAWN AEROBIC ONLY  5CC  Final   Culture PENDING  Incomplete   Report Status PENDING  Incomplete    Radiology Reports Dg Chest Portable 1 View  Result Date: 01/29/2016 CLINICAL DATA:  Shortness of breath EXAM: PORTABLE CHEST 1 VIEW COMPARISON:  Chest CT 08/16/2013 FINDINGS: There is atherosclerotic calcification in the aortic arch. Cardiomediastinal contours are otherwise normal. There is increased airspace opacity in the right  lower lobe. No pleural effusion or pneumothorax. IMPRESSION: Increased opacity in the right lower lobe is concerning for pneumonia. Electronically Signed   By: Ulyses Jarred M.D.   On: 01/29/2016 02:50    Time Spent in minutes  35   Louellen Molder M.D on 01/30/2016 at 11:48 AM  Between 7am to 7pm - Pager - [phone]  After 7pm go to [email or web address] - password [facility]  [facility] -  Office  [phone]

## 2016-01-30 NOTE — Progress Notes (Signed)
  Echocardiogram 2D Echocardiogram has been performed.  Kristen Bradley 01/30/2016, 12:29 PM

## 2016-01-30 NOTE — Progress Notes (Signed)
Pt refuse Neb per unit RT when report was given. PRN neb will be given IF needed for SOB/wheezing.

## 2016-01-30 NOTE — Progress Notes (Signed)
ANTICOAGULATION CONSULT NOTE - Follow-up Consult  Pharmacy Consult for Heparin Indication: chest pain/ACS  Allergies  Allergen Reactions  . Chlorhexidine Gluconate Itching  . Ace Inhibitors Cough  . Dilaudid [Hydromorphone Hcl] Itching    Patient Measurements: Height: 5\' 5"  (165.1 cm) Weight: 128 lb 12.8 oz (58.4 kg) IBW/kg (Calculated) : 57 Heparin Dosing Weight: 59 kg  Vital Signs: Temp: 98.1 F (36.7 C) (12/28 2133) Temp Source: Oral (12/28 2133) BP: 154/78 (12/28 2133) Pulse Rate: 66 (12/28 2133)  Labs:  Recent Labs  01/29/16 0229 01/29/16 0823 01/29/16 1456 01/29/16 1931 01/30/16 0330  HGB 14.5 12.1  --   --  11.0*  HCT 45.6 37.2  --   --  33.2*  PLT 148* 77*  --   --  81*  HEPARINUNFRC  --   --   --   --  0.22*  CREATININE 1.66* 1.87*  --   --   --   TROPONINI 0.07*  --  0.48* 0.40*  --     Estimated Creatinine Clearance: 25.5 mL/min (by C-G formula based on SCr of 1.87 mg/dL (H)).   Assessment: 42 YOF who presented on 12/28 with SOB and was started on antibiotics for CAP. Work-up revealed elevated troponins and pharmacy was consulted to start Heparin for anticoagulation.   The patient has known thrombocytopenia with the usual range appearing to be 120-140s. Plt low but relatively stable over past 24 hours. Hgb down to 11. Heparin level subtherapeutic (0.22) on gtt at 700 units/hr. No issues with line or bleeding reported per RN.  Goal of Therapy:  Heparin level 0.3-0.7 units/ml Monitor platelets by anticoagulation protocol: Yes   Plan:  Increase heparin to 850 units/hr  Will f/u 8 hr heparin level  Thank you for allowing pharmacy to be a part of this patient's care.  Sherlon Handing, PharmD, BCPS Clinical pharmacist, pager [phone] 01/30/2016 4:35 AM

## 2016-01-30 NOTE — Consult Note (Signed)
.         Reason for Consult:   Troponin level elevated  Requesting Physician: [facility] Primary Cardiologist Dr Julianne Handler  HPI:   70 y/o AA female with multiple medical problems admitted 01/29/16 with sudden onset respiratory failure. A History of CAD/PCI is noted in the chart but I could find no evidence of this and the pt denies she ever had a stent to her heart, or prior heart attack. She does have a history of PVD and is s/p Rt renal artery stenting in 2008, Rt CEA 2013, Lt CEA in 2014, and she has LE claudication with ABI's of 0.51 on Rt and 0.45 on Lt. She is followed by Dr [name]. She did have a low risk pre op Myoview in 2012 and a normal echo in 2013. LOV with cardiology was Feb 2016.  She was in her usual state of health till early in the morning 12/28 when she got up to sue the bathroom and upon her return she became suddenly SOB. She denies chest pain or any recent unusual exertional dyspnea. She denies any recent fever or chills. On admission her CXR suggested RLL pneumonia. Her BNP was 1000, Troponin 0.48, and her EKG shows NSR with LBBB which appears to be new. She has been started on Heparin, ABs, and low dose Lasix. She is currently not SOB.    PMHx:  Past Medical History:  Diagnosis Date  . Adrenal tumor 08/2007   surgery  . Cancer of right breast [facility])    s/p lumpectomy, XRT  . Carotid artery disease (HCC)    s/p bilateral CEA  . Hepatitis C    "got treatment for it" (01/29/2016)  . High cholesterol    takes Lopid daily  . History of UTI    takes Diflucan dail--pt states no uti in a couple of yrs though  . Hypertension    takes Losartan,Amlodipine,HCTZ,and Atenolol daily and Clatarpres  . Joint pain    legs  . Renal artery stenosis (HCC)    Right renal artery stent 05/31/06  . Stroke [facility]) Jan. 7, 2014  . Tobacco abuse   . Type II diabetes mellitus ([place])     Past Surgical History:  Procedure Laterality Date  . ABDOMINAL HYSTERECTOMY    .  ADRENAL GLAND SURGERY    . BACK SURGERY    . BREAST LUMPECTOMY Right   . CAROTID ENDARTERECTOMY    . CORONARY ANGIOPLASTY WITH STENT PLACEMENT  2008   1 stent  . ENDARTERECTOMY  01/05/2012   Procedure: ENDARTERECTOMY CAROTID;  Surgeon: Conrad Hartley, MD;  Location: [place];  Service: Vascular;  Laterality: Right;  Right Carotid Artery Endarterectomy, right stump pressure, intraoperative ultrasound  . ENDARTERECTOMY  02/08/2012   Procedure: ENDARTERECTOMY CAROTID;  Surgeon: Conrad Coto Laurel, MD;  Location: [place];  Service: Vascular;  Laterality: Left;  . LUMBAR DISC SURGERY    . PATCH ANGIOPLASTY  02/08/2012   Procedure: PATCH ANGIOPLASTY;  Surgeon: Conrad Fern Park, MD;  [facility] OR;  Service: Vascular;  Laterality: Left;  Carotid artery patch angioplasty using Vascu-Guard bovine patch 1cm x 6cm.  . RENAL ARTERY STENT Right 05/2006    SOCHx:  reports that she has been smoking Cigarettes.  She has a 26.50 pack-year smoking history. She has never used smokeless tobacco. She reports that she does not drink alcohol or use drugs.  FAMHx: Family History  Problem Relation Age of Onset  . Cancer Mother     ?  type  . Cancer Father     ? type  . Diabetes Sister   . Hypertension Sister   . Diabetes Brother   . Hypertension Brother   . CAD Neg Hx     ALLERGIES: Allergies  Allergen Reactions  . Chlorhexidine Gluconate Itching  . Ace Inhibitors Cough  . Dilaudid [Hydromorphone Hcl] Itching    ROS: Review of Systems: General: negative for chills, fever, night sweats or weight changes.  Cardiovascular: negative for chest pain, edema, orthopnea, palpitations,  HEENT: negative for any visual disturbances, blindness, glaucoma Dermatological: negative for rash Respiratory: negative for cough, hemoptysis, or wheezing Urologic: negative for hematuria or dysuria Abdominal: negative for nausea, vomiting, diarrhea, bright red blood per rectum, melena, or hematemesis Neurologic: negative for visual  changes, syncope, or dizziness Musculoskeletal: negative for back pain, joint pain, or swelling Psych: cooperative and appropriate Chronic claudication All other systems reviewed and are otherwise negative except as noted above.   HOME MEDICATIONS: Prior to Admission medications   Medication Sig Start Date End Date Taking? Authorizing Provider  amLODipine (NORVASC) 10 MG tablet Take 10 mg by mouth at bedtime.     Yes Historical Provider, MD  aspirin EC 81 MG tablet Take 81 mg by mouth daily.     Yes Historical Provider, MD  atenolol (TENORMIN) 100 MG tablet Take 100 mg by mouth every morning.     Yes Historical Provider, MD  cloNIDine (CATAPRES) 0.1 MG tablet Take 0.1 mg by mouth 2 (two) times daily.     Yes Historical Provider, MD  clopidogrel (PLAVIX) 75 MG tablet Take 1 tablet (75 mg total) by mouth daily. 01/05/12  Yes Regina J Roczniak, PA-C  hydrochlorothiazide (HYDRODIURIL) 12.5 MG tablet Take 12.5 mg by mouth daily.  02/07/12  Yes Historical Provider, MD  losartan (COZAAR) 100 MG tablet Take 100 mg by mouth daily.  12/09/11  Yes Historical Provider, MD  metFORMIN (GLUCOPHAGE) 500 MG tablet Take 500 mg by mouth 2 (two) times daily with a meal.     Yes Historical Provider, MD  PRODIGY NO CODING BLOOD GLUC test strip 3 (three) times daily.  03/08/12  Yes Historical Provider, MD  PRODIGY TWIST TOP LANCETS 28G MISC 3 (three) times daily.  12/17/11  Yes Historical Provider, MD  simvastatin (ZOCOR) 40 MG tablet Take 40 mg by mouth daily.  01/12/12  Yes Historical Provider, MD  traMADol (ULTRAM) 50 MG tablet Take 1 tablet (50 mg total) by mouth every 6 (six) hours as needed. Patient taking differently: Take 50 mg by mouth every 6 (six) hours as needed for moderate pain.  02/09/12  Yes Ulyses Amor, PA-C  cilostazol (PLETAL) 100 MG tablet Take 1 tablet (100 mg total) by mouth 2 (two) times daily before a meal. Patient not taking: Reported on 01/29/2016 09/19/15   Sharmon Leyden Nickel, NP  cilostazol  (PLETAL) 100 MG tablet Take 1 tablet (100 mg total) by mouth 2 (two) times daily before a meal. Patient not taking: Reported on 01/29/2016 09/22/15   Sharmon Leyden Nickel, NP    HOSPITAL MEDICATIONS: I have reviewed the patient's current medications.  VITALS: Blood pressure (!) 145/65, pulse 70, temperature 98.7 F (37.1 C), temperature source Oral, resp. rate 18, height 5\' 5"  (1.651 m), weight 128 lb 12.8 oz (58.4 kg), SpO2 97 %.  PHYSICAL EXAM: General appearance: alert, cooperative, no distress and thin Neck: no JVD and bilateral carotid bruits Lungs: rhonchi Rt base Heart: regular rate and rhythm and 2/6 MR murmur  Abdomen: soft, non-tender; bowel sounds normal; no masses,  no organomegaly Extremities: extremities normal, atraumatic, no cyanosis or edema Pulses: 2+ and symmetric diminnished distal pulses Skin: Skin color, texture, turgor normal. No rashes or lesions Neurologic: Grossly normal  LABS: Results for orders placed or performed during the hospital encounter of 01/29/16 (from the past 24 hour(s))  Troponin I (q 6hr x 3)     Status: Abnormal   Collection Time: 01/29/16  2:56 PM  Result Value Ref Range   Troponin I 0.48 (HH) <0.03 ng/mL  Glucose, capillary     Status: Abnormal   Collection Time: 01/29/16  5:21 PM  Result Value Ref Range   Glucose-Capillary 132 (H) 65 - 99 mg/dL  Strep pneumoniae urinary antigen     Status: None   Collection Time: 01/29/16  5:56 PM  Result Value Ref Range   Strep Pneumo Urinary Antigen NEGATIVE NEGATIVE  Troponin I (q 6hr x 3)     Status: Abnormal   Collection Time: 01/29/16  7:31 PM  Result Value Ref Range   Troponin I 0.40 (HH) <0.03 ng/mL  Glucose, capillary     Status: Abnormal   Collection Time: 01/29/16  9:43 PM  Result Value Ref Range   Glucose-Capillary 149 (H) 65 - 99 mg/dL  Comprehensive metabolic panel     Status: Abnormal   Collection Time: 01/30/16  3:30 AM  Result Value Ref Range   Sodium 137 135 - 145 mmol/L    Potassium 3.3 (L) 3.5 - 5.1 mmol/L   Chloride 101 101 - 111 mmol/L   CO2 26 22 - 32 mmol/L   Glucose, Bld 124 (H) 65 - 99 mg/dL   BUN 24 (H) 6 - 20 mg/dL   Creatinine, Ser 1.78 (H) 0.44 - 1.00 mg/dL   Calcium 10.2 8.9 - 10.3 mg/dL   Total Protein 6.3 (L) 6.5 - 8.1 g/dL   Albumin 3.2 (L) 3.5 - 5.0 g/dL   AST 20 15 - 41 U/L   ALT 14 14 - 54 U/L   Alkaline Phosphatase 69 38 - 126 U/L   Total Bilirubin 0.8 0.3 - 1.2 mg/dL   GFR calc non Af Amer 28 (L) >60 mL/min   GFR calc Af Amer 32 (L) >60 mL/min   Anion gap 10 5 - 15  CBC     Status: Abnormal   Collection Time: 01/30/16  3:30 AM  Result Value Ref Range   WBC 6.9 4.0 - 10.5 K/uL   RBC 3.79 (L) 3.87 - 5.11 MIL/uL   Hemoglobin 11.0 (L) 12.0 - 15.0 g/dL   HCT 33.2 (L) 36.0 - 46.0 %   MCV 87.6 78.0 - 100.0 fL   MCH 29.0 26.0 - 34.0 pg   MCHC 33.1 30.0 - 36.0 g/dL   RDW 14.4 11.5 - 15.5 %   Platelets 81 (L) 150 - 400 K/uL  Heparin level (unfractionated)     Status: Abnormal   Collection Time: 01/30/16  3:30 AM  Result Value Ref Range   Heparin Unfractionated 0.22 (L) 0.30 - 0.70 IU/mL  Glucose, capillary     Status: Abnormal   Collection Time: 01/30/16  7:56 AM  Result Value Ref Range   Glucose-Capillary 135 (H) 65 - 99 mg/dL  Glucose, capillary     Status: Abnormal   Collection Time: 01/30/16 12:14 PM  Result Value Ref Range   Glucose-Capillary 133 (H) 65 - 99 mg/dL    EKG: NSR, LBBB  IMAGING: Dg Chest Portable 1 View  Result Date: 01/29/2016 CLINICAL DATA:  Shortness of breath EXAM: PORTABLE CHEST 1 VIEW COMPARISON:  Chest CT 08/16/2013 FINDINGS: There is atherosclerotic calcification in the aortic arch. Cardiomediastinal contours are otherwise normal. There is increased airspace opacity in the right lower lobe. No pleural effusion or pneumothorax. IMPRESSION: Increased opacity in the right lower lobe is concerning for pneumonia. Electronically Signed   By: Ulyses Jarred M.D.   On: 01/29/2016 02:50     IMPRESSION: Principal Problem:   Acute respiratory failure (HCC) Active Problems:   Community acquired pneumonia   LBBB- new   Elevated troponin I level   Elevated brain natriuretic peptide (BNP) level   Hypertension   Acute kidney injury ([place])   Diabetes mellitus with complication (HCC)   Thrombocytopenia- plts 81K   Peripheral vascular disease (HCC)   Carotid stenosis, bilateral-s/p Bilateral CEA   Atherosclerosis of native artery of extremity with intermittent claudication (HCC)   RECOMMENDATION: Disturbing presentation with sudden dyspnea and respiratory failure requiring BiPap in the ED. She has widespread PVD, elevated Troponin, MR murmur not previously noted, and new LBBB on EKG, but no chest pain. R/O acute MI, R/O acute diastolic CHF (though she has not this previously), R/O CAP. I would also entertain acute pulmonary embolism in the differential diagnosis (Hx of breast cancer) -check D-dimer.   She is on Heparin but her Plts are low. She is getting ABs and is on low dose Lasix (SCr 1.78). Echo is pending. Not a candidate for cath at the moment as she is currently stable and her renal function is abnormal. MD to see.  Time Spent Directly with Patient: 50 minutes  Kerin Ransom, Harris beeper 01/30/2016, 2:42 PM   Personally seen and examined. Agree with above.  70 year old female with severe peripheral vascular disease, thrombocytopenia, carotid endarterectomy, chronic kidney disease with acute shortness of breath, left bundle branch block newly discovered without chest pain as well as newly discovered cardiomyopathy with ejection fraction of approximate 25%..  Scheduled Meds: . albuterol  2.5 mg Nebulization Q6H  . amLODipine  10 mg Oral QHS  . aspirin EC  81 mg Oral Daily  . atenolol  100 mg Oral BH-q7a  . azithromycin  500 mg Intravenous Q24H  . cefTRIAXone (ROCEPHIN)  IV  1 g Intravenous Q24H  . cloNIDine  0.1 mg Oral BID  . clopidogrel  75 mg Oral  Daily  . furosemide  20 mg Intravenous BID  . insulin aspart  0-9 Units Subcutaneous TID WC  . simvastatin  40 mg Oral QHS   Continuous Infusions: . heparin 950 Units/hr (01/30/16 1626)   PRN Meds:.acetaminophen **OR** acetaminophen, albuterol, hydrALAZINE, ondansetron **OR** ondansetron (ZOFRAN) IV, polyethylene glycol, traMADol   Acute systolic heart failure/elevated troponin   - creatinine elevation of 1.78.  - I personally reviewed her echocardiogram and her ejection fraction is markedly decreased approximally 25%. She deserves cardiac catheterization for possible diagnosis of ischemic cardiomyopathy. We will try to optimize her renal performance prior to cardiac catheterization but this may be troublesome given the fact that her ejection fraction is poor. Elevated flat troponin likely demand ischemia in the setting of new systolic dysfunction.  The earliest that we can perform cardiac catheterization is on Tuesday given the holiday on Monday. I discussed with family and they understand. Risks of stroke, worsening renal impairment, worsening MI, death discussed.  Her breathing appears quite comfortable in bed currently. Mid neck JVD noted. Heart regular rate and rhythm.   - I will  change her atenolol to Toprol 100 mg a day.  - I will hold her Plavix 75 mg as well in case she is in need of bypass surgery. Thrombocytopenia is mild, 81,000.  - Agree with low-dose IV furosemide. Perhaps continue decongestion will result in improved starling curve and improved renal function.  - Agree with IV heparin.  - Replete potassium.  We will continue to follow.  Candee Furbish, MD

## 2016-01-30 NOTE — Progress Notes (Signed)
ANTICOAGULATION CONSULT NOTE - Follow-up Consult  Pharmacy Consult for Heparin Indication: chest pain/ACS  Allergies  Allergen Reactions  . Chlorhexidine Gluconate Itching  . Ace Inhibitors Cough  . Dilaudid [Hydromorphone Hcl] Itching    Patient Measurements: Height: 5\' 5"  (165.1 cm) Weight: 128 lb 12.8 oz (58.4 kg) IBW/kg (Calculated) : 57 Heparin Dosing Weight: 59 kg  Vital Signs: Temp: 98.7 F (37.1 C) (12/29 0541) Temp Source: Oral (12/29 0541) BP: 145/65 (12/29 0541) Pulse Rate: 70 (12/29 0541)  Labs:  Recent Labs  01/29/16 0229 01/29/16 0823 01/29/16 1456 01/29/16 1931 01/30/16 0330 01/30/16 1306  HGB 14.5 12.1  --   --  11.0*  --   HCT 45.6 37.2  --   --  33.2*  --   PLT 148* 77*  --   --  81*  --   HEPARINUNFRC  --   --   --   --  0.22* 0.28*  CREATININE 1.66* 1.87*  --   --  1.78*  --   TROPONINI 0.07*  --  0.48* 0.40*  --   --     Estimated Creatinine Clearance: 26.8 mL/min (by C-G formula based on SCr of 1.78 mg/dL (H)).   Assessment: 43 YOF who presented on 12/28 with SOB and was started on antibiotics for CAP. Work-up revealed elevated troponins and pharmacy was consulted to start Heparin for anticoagulation.   The patient has known thrombocytopenia with the usual range appearing to be 120-140s.  Platelets stable  PM Heparin level = 0.28  Goal of Therapy:  Heparin level 0.3-0.7 units/ml Monitor platelets by anticoagulation protocol: Yes   Plan:  Increase heparin to 950 units/hr  AM labs  Thank you for allowing pharmacy to be a part of this patient's care. Anette Guarneri, PharmD [phone] 01/30/2016 3:03 PM

## 2016-01-31 DIAGNOSIS — N178 Other acute kidney failure: Secondary | ICD-10-CM

## 2016-01-31 DIAGNOSIS — N183 Chronic kidney disease, stage 3 (moderate): Secondary | ICD-10-CM

## 2016-01-31 DIAGNOSIS — I214 Non-ST elevation (NSTEMI) myocardial infarction: Secondary | ICD-10-CM

## 2016-01-31 DIAGNOSIS — I455 Other specified heart block: Secondary | ICD-10-CM

## 2016-01-31 DIAGNOSIS — I255 Ischemic cardiomyopathy: Secondary | ICD-10-CM

## 2016-01-31 DIAGNOSIS — N19 Unspecified kidney failure: Secondary | ICD-10-CM

## 2016-01-31 DIAGNOSIS — I70213 Atherosclerosis of native arteries of extremities with intermittent claudication, bilateral legs: Secondary | ICD-10-CM

## 2016-01-31 LAB — CBC
HCT: 36.9 % (ref 36.0–46.0)
Hemoglobin: 11.7 g/dL — ABNORMAL LOW (ref 12.0–15.0)
MCH: 28.8 pg (ref 26.0–34.0)
MCHC: 31.7 g/dL (ref 30.0–36.0)
MCV: 90.9 fL (ref 78.0–100.0)
PLATELETS: 85 10*3/uL — AB (ref 150–400)
RBC: 4.06 MIL/uL (ref 3.87–5.11)
RDW: 14.8 % (ref 11.5–15.5)
WBC: 3.7 10*3/uL — AB (ref 4.0–10.5)

## 2016-01-31 LAB — GLUCOSE, CAPILLARY
Glucose-Capillary: 149 mg/dL — ABNORMAL HIGH (ref 65–99)
Glucose-Capillary: 115 mg/dL — ABNORMAL HIGH (ref 65–99)
Glucose-Capillary: 133 mg/dL — ABNORMAL HIGH (ref 65–99)
Glucose-Capillary: 144 mg/dL — ABNORMAL HIGH (ref 65–99)

## 2016-01-31 LAB — BASIC METABOLIC PANEL
Anion gap: 11 (ref 5–15)
BUN: 29 mg/dL — AB (ref 6–20)
CO2: 25 mmol/L (ref 22–32)
Calcium: 10.6 mg/dL — ABNORMAL HIGH (ref 8.9–10.3)
Chloride: 104 mmol/L (ref 101–111)
Creatinine, Ser: 1.77 mg/dL — ABNORMAL HIGH (ref 0.44–1.00)
GFR calc Af Amer: 33 mL/min — ABNORMAL LOW (ref >60)
GFR, EST NON AFRICAN AMERICAN: 28 mL/min — AB (ref >60)
Glucose, Bld: 145 mg/dL — ABNORMAL HIGH (ref 65–99)
POTASSIUM: 3.6 mmol/L (ref 3.5–5.1)
SODIUM: 140 mmol/L (ref 135–145)

## 2016-01-31 LAB — HEPARIN LEVEL (UNFRACTIONATED): Heparin Unfractionated: 0.42 IU/mL (ref 0.30–0.70)

## 2016-01-31 LAB — SAVE SMEAR

## 2016-01-31 MED ORDER — HEPARIN SODIUM (PORCINE) 5000 UNIT/ML IJ SOLN
5000.0000 [IU] | Freq: Three times a day (TID) | INTRAMUSCULAR | Status: DC
  Administered 2016-01-31 – 2016-02-03 (×9): 5000 [IU] via SUBCUTANEOUS
  Filled 2016-01-31 (×9): qty 1

## 2016-01-31 MED ORDER — HEPARIN SODIUM (PORCINE) 5000 UNIT/ML IJ SOLN: 5000.0000 [IU] | Freq: Three times a day (TID) | INTRAMUSCULAR | Status: DC

## 2016-01-31 MED ORDER — METOPROLOL SUCCINATE ER 50 MG PO TB24
50.0000 mg | ORAL_TABLET | Freq: Every day | ORAL | Status: DC
  Administered 2016-01-31 – 2016-02-01 (×2): 50 mg via ORAL
  Filled 2016-01-31 (×2): qty 1

## 2016-01-31 NOTE — Progress Notes (Signed)
Patient had another 2.3 second pause with SB down to 40 nonsustained. Will continue to monitor. Dr. aware

## 2016-01-31 NOTE — Progress Notes (Signed)
Patient Name: Kristen Bradley Date of Encounter: 01/31/2016  Primary Cardiologist: Dr [facility] Problem List     Principal Problem:   Acute respiratory failure [facility]) Active Problems:   Peripheral vascular disease (HCC)   Carotid stenosis, bilateral-s/p Bilateral CEA   Atherosclerosis of native artery of extremity with intermittent claudication ([place])   Community acquired pneumonia   Hypertension   Acute kidney injury ([place])   Diabetes mellitus with complication ([place])   LBBB- new   Elevated troponin I level   Thrombocytopenia- plts 81K   Elevated brain natriuretic peptide (BNP) level     Subjective    no new complaints, no chest pain, no significant shortness of breath  Inpatient Medications    Scheduled Meds: . amLODipine  10 mg Oral QHS  . aspirin EC  81 mg Oral Daily  . azithromycin  500 mg Intravenous Q24H  . cefTRIAXone (ROCEPHIN)  IV  1 g Intravenous Q24H  . cloNIDine  0.1 mg Oral BID  . furosemide  20 mg Intravenous BID  . insulin aspart  0-9 Units Subcutaneous TID WC  . metoprolol succinate  100 mg Oral Daily  . simvastatin  40 mg Oral QHS   Continuous Infusions: . heparin 950 Units/hr (01/30/16 2137)   PRN Meds: acetaminophen **OR** acetaminophen, albuterol, hydrALAZINE, ondansetron **OR** ondansetron (ZOFRAN) IV, polyethylene glycol, traMADol   Vital Signs    Vitals:   01/30/16 2105 01/30/16 2231 01/31/16 0547 01/31/16 0549  BP: (!) 174/78 (!) 157/66  (!) 144/60  Pulse: (!) 58 (!) 57  (!) 56  Resp: 18   17  Temp: 97.5 F (36.4 C)   98.7 F (37.1 C)  TempSrc: Oral   Oral  SpO2: 100%   99%  Weight:   123 lb 1.6 oz (55.8 kg)   Height:        Intake/Output Summary (Last 24 hours) at 01/31/16 0827 Last data filed at 01/31/16 0557  Gross per 24 hour  Intake           774.34 ml  Output              550 ml  Net           224.34 ml   Filed Weights   01/29/16 1132 01/30/16 0320 01/31/16 0547  Weight: 130 lb 4.7 oz (59.1 kg) 128 lb 12.8  oz (58.4 kg) 123 lb 1.6 oz (55.8 kg)    Physical Exam    GEN: Well nourished, well developed, in no acute distress.  HEENT: Grossly normal.  Neck: Supple, no JVD, carotid bruits, or masses. Cardiac: RRR, 2/6 murmur, no rubs, or gallops. No clubbing, cyanosis, edema.  Radials/DP/PT 2+ and equal bilaterally.  Respiratory:  Respirations regular and unlabored, clear to auscultation bilaterally. GI: Soft, nontender, nondistended, BS + x 4. MS: no deformity or atrophy. Skin: warm and dry, no rash. Neuro:  Strength and sensation are intact. Psych: AAOx3.  Normal affect.  Labs    CBC  Recent Labs  01/30/16 0330 01/31/16 0615  WBC 6.9 3.7*  HGB 11.0* 11.7*  HCT 33.2* 36.9  MCV 87.6 90.9  PLT 81* 85*   Basic Metabolic Panel  Recent Labs  01/30/16 0330 01/31/16 0615  NA 137 140  K 3.3* 3.6  CL 101 104  CO2 26 25  GLUCOSE 124* 145*  BUN 24* 29*  CREATININE 1.78* 1.77*  CALCIUM 10.2 10.6*   Liver Function Tests  Recent Labs  01/29/16 0229 01/30/16 0330  AST 30 20  ALT 19 14  ALKPHOS 107 69  BILITOT 0.6 0.8  PROT 7.5 6.3*  ALBUMIN 3.7 3.2*   No results for input(s): LIPASE, AMYLASE in the last 72 hours. Cardiac Enzymes  Recent Labs  01/29/16 0229 01/29/16 1456 01/29/16 1931  TROPONINI 0.07* 0.48* 0.40*   BNP Invalid input(s): POCBNP D-Dimer  Recent Labs  01/30/16 1639  DDIMER 1.73*   Hemoglobin A1C No results for input(s): HGBA1C in the last 72 hours. Fasting Lipid Panel No results for input(s): CHOL, HDL, LDLCALC, TRIG, CHOLHDL, LDLDIRECT in the last 72 hours. Thyroid Function Tests  Recent Labs  01/29/16 1148  TSH 0.978    Telemetry    2.2 second pause noted yesterday evening on telemetry, sinus rhythm/sinus bradycardia, underlying left bundle branch block - Personally Reviewed  ECG    Normal sinus rhythm, left bundle branch block - Personally Reviewed  Radiology    No results found.  Cardiac Studies   ECHO 01/30/16: - Normal  LV size with mild LV hypertrophy. EF 25% with severe   diffuse hypokinesis and septal-lateral dyssynchrony. Restrictive   diastolic function. Normal RV size with mildly decreased systolic   function. Moderate pulmonary hypertension. Mild to moderate MR.   Dilated IVC suggestive of elevated RV filling pressure.  Patient Profile     70 year old with newly discovered acute systolic heart failure/dilated cardiomyopathy EF 25% with history of peripheral vascular disease status post right renal artery stenting 2008, right and left carotid endarterectomy in 2013, 2014, lower extremity claudication with ABIs of 0.5 and 0.45 right and left followed by Dr. [name] with prior normal echocardiogram in 2013 admitted with acute shortness of breath, mildly elevated troponin of 0.48, new left bundle branch block.  Assessment & Plan    Acute systolic heart failure/dilated cardiomyopathy  - Mildly elevated troponin consistent with demand ischemia in the setting  - Optimizing medications, given her 2.2 second pause, I will decrease her Toprol from 100 mg a day down to 50 mg a day. I changed her atenolol to Toprol yesterday.  - Avoiding ACE inhibitor/ARB given creatinine of 1.7.  - She is currently -483 mL, slightly positive yesterday. Receiving low-dose Lasix 20 mg IV. She appears fairly euvolemic at this point, comfortable, no orthopnea. I will stop her Lasix in anticipation of cardiac catheterization on Tuesday to discover whether or not she has coronary artery disease contributing to her markedly reduced ejection fraction. Given her severe peripheral vascular disease this may certainly be an issue.   - I've also stopped her Plavix 75 mg on 01/30/16 just in case her cardiac catheterization reveals triple-vessel disease which would require bypass surgery.  - I think it would be reasonable given 48 hours of IV heparin use to discontinue at this time. Obviously if she has any anginal symptoms we can always restart prior  to cardiac catheterization.  Sinus pause 2.2 seconds 01/30/16  - Decreased metoprolol to 50 mg.   Acute kidney injury  - Creatinine was 0.74 in 2013, 1.25 in 2014 and 1.66 on admission 01/29/16.  - Stopping Lasix today.  - Trying to optimize prior to cardiac catheterization hopefully Tuesday.  Signed, Candee Furbish, MD  01/31/2016, 8:27 AM

## 2016-01-31 NOTE — Consult Note (Signed)
[place]  [phone] [phone]     ID: Kristen Bradley DOB: 1945/06/04  MR#: [id number]  NOM#:[id number]  Patient Care Team: [name], MD as PCP - General (Family Medicine) PCP: Cammy Copa, MD Chauncey Cruel, MD GYN: SU:  OTHER MD:  CHIEF COMPLAINT: thrombocytopenia  CURRENT TREATMENT: workup in progress  RESEARCH PROTOCOL: no  HPI: Patient is a poor historian (did not mention her prior stroke or history of non invasive breast cancer until brought up by her son) but she presented to the ED 01/28/2017 with worsening SOB. Workup this admission suggests NSTEMI and finds an EF of 25% (was 60% 12/07/2011). She is on heparin and 81 mg ASA pending cardiac catheterization planned for 02/03/2016  We were consulted because of thrombocytopenia. Her counts this admission have been Results for Kristen Bradley, Kristen Bradley (MRN [id number]) as of 01/31/2016 16:01  Ref. Range 01/29/2016 02:29 01/29/2016 08:23 01/30/2016 03:30 01/31/2016 06:15  Platelets Latest Ref Range: 150 - 400 K/uL 148 (L) 77 (L) 81 (L) 85 (L)   Prior counts include Results for Kristen Bradley, Kristen Bradley (MRN [id number]) as of 01/31/2016 16:01  Ref. Range 08/29/2007 19:41 08/30/2007 04:55 08/31/2007 05:20 10/03/2007 09:44 03/22/2009 09:59 01/16/2010 07:26  Platelets Latest Ref Range: 150 - 400 K/uL 100 (L) 100 (L) 104 (L) 205 120 (L) 122 (L)   Note her WBC count is WNL for an African American and she is not anemic  INTERVAL HISTORY: The patient was evaluated in her hospital room 01/31/2016 with her son, daughter in law and grand-daughter present  REVIEW OF SYSTEMS: Currently she denies CP, SOB, cough. She does her housework but does not otherwise exercise. She denies residuals from her prior CVA. Denies any bleeding or unusual bruising, or blood in stool or urine. Denies recent changes in medication. She tells me her PCP has never mentioned a problem with platelets in the past  Allergies  Allergen  Reactions  . Chlorhexidine Gluconate Itching  . Ace Inhibitors Cough  . Dilaudid [Hydromorphone Hcl] Itching    Current Facility-Administered Medications  Medication Dose Route Frequency Provider Last Rate Last Dose  . acetaminophen (TYLENOL) tablet 650 mg  650 mg Oral Q6H PRN Brenton Grills, PA-C       Or  . acetaminophen (TYLENOL) suppository 650 mg  650 mg Rectal Q6H PRN Brenton Grills, PA-C      . albuterol (PROVENTIL) (2.5 MG/3ML) 0.083% nebulizer solution 2.5 mg  2.5 mg Nebulization Q2H PRN Brenton Grills, PA-C      . amLODipine (NORVASC) tablet 10 mg  10 mg Oral QHS Brenton Grills, PA-C   10 mg at 01/30/16 2233  . aspirin EC tablet 81 mg  81 mg Oral Daily Brenton Grills, PA-C   81 mg at 01/31/16 5465  . azithromycin (ZITHROMAX) 500 mg in dextrose 5 % 250 mL IVPB  500 mg Intravenous Q24H Waldemar Dickens, MD   500 mg at 01/31/16 0501  . cefTRIAXone (ROCEPHIN) 1 g in dextrose 5 % 50 mL IVPB  1 g Intravenous Q24H Waldemar Dickens, MD   1 g at 01/31/16 0419  . cloNIDine (CATAPRES) tablet 0.1 mg  0.1 mg Oral BID Brenton Grills, PA-C   0.1 mg at 01/31/16 6812  . heparin injection 5,000 Units  5,000 Units Subcutaneous Q8H Nishant Dhungel, MD   5,000 Units at 01/31/16 1336  . hydrALAZINE (APRESOLINE) injection 10 mg  10 mg Intravenous Q6H PRN Brenton Grills, PA-C  10 mg at 01/30/16 2134  . insulin aspart (novoLOG) injection 0-9 Units  0-9 Units Subcutaneous TID WC Brenton Grills, PA-C   1 Units at 01/31/16 1610  . metoprolol succinate (TOPROL-XL) 24 hr tablet 50 mg  50 mg Oral Daily Jerline Pain, MD   50 mg at 01/31/16 0933  . ondansetron (ZOFRAN) tablet 4 mg  4 mg Oral Q6H PRN Brenton Grills, PA-C       Or  . ondansetron [facility]) injection 4 mg  4 mg Intravenous Q6H PRN Brenton Grills, PA-C      . polyethylene glycol (MIRALAX / GLYCOLAX) packet 17 g  17 g Oral Daily PRN Brenton Grills, PA-C      . simvastatin (ZOCOR) tablet 40 mg  40 mg Oral QHS  Brenton Grills, PA-C   40 mg at 01/30/16 2229  . traMADol (ULTRAM) tablet 50 mg  50 mg Oral Q8H PRN Brenton Grills, PA-C   50 mg at 01/30/16 1922    PAST MEDICAL HISTORY: Past Medical History:  Diagnosis Date  . Adrenal tumor 08/2007   surgery  . Cancer of right breast [facility])    s/p lumpectomy, XRT  . Carotid artery disease (HCC)    s/p bilateral CEA  . Hepatitis C    "got treatment for it" (01/29/2016)  . High cholesterol    takes Lopid daily  . History of UTI    takes Diflucan dail--pt states no uti in a couple of yrs though  . Hypertension    takes Losartan,Amlodipine,HCTZ,and Atenolol daily and Clatarpres  . Joint pain    legs  . Renal artery stenosis (HCC)    Right renal artery stent 05/31/06  . Stroke [facility]) Jan. 7, 2014  . Tobacco abuse   . Type II diabetes mellitus ([place])     PAST SURGICAL HISTORY: Past Surgical History:  Procedure Laterality Date  . ABDOMINAL HYSTERECTOMY    . ADRENAL GLAND SURGERY  2009  . BREAST LUMPECTOMY Right 2009  . ENDARTERECTOMY  01/05/2012   Procedure: ENDARTERECTOMY CAROTID;  Surgeon: Conrad Cherry, MD;  Location: [place];  Service: Vascular;  Laterality: Right;  Right Carotid Artery Endarterectomy, right stump pressure, intraoperative ultrasound  . ENDARTERECTOMY  02/08/2012   Procedure: ENDARTERECTOMY CAROTID;  Surgeon: Conrad La Villita, MD;  Location: [place];  Service: Vascular;  Laterality: Left;  . LUMBAR DISC SURGERY    . PATCH ANGIOPLASTY  02/08/2012   Procedure: PATCH ANGIOPLASTY;  Surgeon: Conrad [place], MD;  Location: [facility] OR;  Service: Vascular;  Laterality: Left;  Carotid artery patch angioplasty using Vascu-Guard bovine patch 1cm x 6cm.  . RENAL ARTERY STENT Right 05/2006    FAMILY HISTORY Family History  Problem Relation Age of Onset  . Cancer Mother     ? type  . Cancer Father     ? type  . Diabetes Sister   . Hypertension Sister   . Diabetes Brother   . Hypertension Brother   . CAD Neg Hx   She knows little about her  father. Her mother died about age 35 from heart problems. One brother, with liver and prostate probems, and 3 sisters, one deceased from a ruptured aneurysm.  GENETICS TESTING: n/a  GYNECOLOGIC HISTORY:  No LMP recorded. Patient has had a hysterectomy.  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 At home it's jusy her and he SO Kristen Bradley, who works in Careers [employer]. The patient's son  Kristen Bradley is a Cabin crew. The patient attends [employer]    ADVANCED DIRECTIVES: <no information>   HEALTH MAINTENANCE: Social History  Substance Use Topics  . Smoking status: Current Every Day Smoker    Packs/day: 0.50    Years: 53.00    Types: Cigarettes  . Smokeless tobacco: Never Used  . Alcohol use No     OBJECTIVE: middle aged African American woman examined in bed Vitals:   01/31/16 0937 01/31/16 1453  BP: (!) 152/63 (!) 146/59  Pulse: (!) 53 (!) 50  Resp:  19  Temp:  98.6 F (37 C)     Body mass index is 20.48 kg/m.    ECOG FS:1 - Symptomatic but completely ambulatory  Sclerae unicteric, EOMs intact No cervical or supraclavicular adenopathy Lungs no rales or rhonchi Heart regular rate and rhythm Abd soft, nontender, positive bowel sounds, no palpable splenomegaly No upper extremity lymphedema Neuro: nonfocal, well oriented, appropriate affect Breasts: Deferred   LAB RESULTS:  CMP     Component Value Date/Time   NA 140 01/31/2016 0615   K 3.6 01/31/2016 0615   CL 104 01/31/2016 0615   CO2 25 01/31/2016 0615   GLUCOSE 145 (H) 01/31/2016 0615   BUN 29 (H) 01/31/2016 0615   CREATININE 1.77 (H) 01/31/2016 0615   CREATININE 0.82 11/05/2011 1208   CALCIUM 10.6 (H) 01/31/2016 0615   PROT 6.3 (L) 01/30/2016 0330   ALBUMIN 3.2 (L) 01/30/2016 0330   AST 20 01/30/2016 0330   ALT 14 01/30/2016 0330   ALKPHOS 69 01/30/2016 0330   BILITOT 0.8 01/30/2016 0330   GFRNONAA 28 (L) 01/31/2016 0615   GFRAA 33 (L) 01/31/2016 0615    No results found  for: KPAFRELGTCHN, LAMBDASER, KAPLAMBRATIO  No results found for: TOTALPROTELP, ALBUMINELP, A1GS, A2GS, BETS, BETA2SER, GAMS, MSPIKE, SPEI  Lab Results  Component Value Date   WBC 3.7 (L) 01/31/2016   NEUTROABS 7.5 03/22/2009   HGB 11.7 (L) 01/31/2016   HCT 36.9 01/31/2016   MCV 90.9 01/31/2016   PLT 85 (L) 01/31/2016    @LASTCHEMISTRY @  No results found for: LABCA2  No components found for: [id number]  No results for input(s): INR in the last 168 hours.  Urinalysis    Component Value Date/Time   COLORURINE YELLOW 02/08/2012 1100   APPEARANCEUR CLOUDY (A) 02/08/2012 1100   LABSPEC 1.020 02/08/2012 1100   PHURINE 5.0 02/08/2012 1100   GLUCOSEU NEGATIVE 02/08/2012 1100   HGBUR TRACE (A) 02/08/2012 1100   BILIRUBINUR SMALL (A) 02/08/2012 1100   KETONESUR NEGATIVE 02/08/2012 1100   PROTEINUR >300 (A) 02/08/2012 1100   UROBILINOGEN 1.0 02/08/2012 1100   NITRITE NEGATIVE 02/08/2012 1100   LEUKOCYTESUR NEGATIVE 02/08/2012 1100    RADIOLOGY AND OTHER STUDIES: Dg Chest Portable 1 View  Result Date: 01/29/2016 CLINICAL DATA:  Shortness of breath EXAM: PORTABLE CHEST 1 VIEW COMPARISON:  Chest CT 08/16/2013 FINDINGS: There is atherosclerotic calcification in the aortic arch. Cardiomediastinal contours are otherwise normal. There is increased airspace opacity in the right lower lobe. No pleural effusion or pneumothorax. IMPRESSION: Increased opacity in the right lower lobe is concerning for pneumonia. Electronically Signed   By: Ulyses Jarred M.D.   On: 01/29/2016 02:50      ASSESSMENT: 70 y.o. [place] woman with moderate thrombocytopenia in setting of acute CHF with cardiac cath planned for 02/03/2016  Review of blood film shows no schistocytes. There are no obvious artefactual platelet clumps. White cell series is bland  PLAN: There is  no evidence of TTP/HUS or DIC clinically or by smear review. HIT is not going to be the cause as the patient was not on heparin on admission  or on prior occasions when her platelet count has been low. I do not palpate an enlarged spleen and spleen was normal on CT abd 07/30/2013.  Artefactual thrombocytopenia is a real possibility given the very variable counts we have on record as documented above. This occurs because the in vitro clumping process is sensitive not only to the anticoagulant in the tube but to the trauma of the venipuncture itself.  To try to document that I am requesting a platelet count to be repeated in a lavender and a blue top tube now. If there is a significant difference in the two readings no further evaluation would be needed.  If that is nondiagnostic then Kristen Bradley may have moderate ITP. In general this requires only follow-up as we do not treat unless the platelet count drops below 50K on 2 sequential occasions.  If the patient requires a platelet count >100K for the upcoming catherterization would suggest transfusion. However patients generally do well with counts in the 80K range as here. She soul be able to tolerate antiplatelet agents as well  Will follow with you while in house and arrange for long-term f/u through our clinic as appropriate. Appreciate consult!  Chauncey Cruel, MD   01/31/2016 4:13 PM Medical Oncology and Hematology [facility] [address] [place], [place] [address] Tel. [phone]    Fax. [phone]

## 2016-01-31 NOTE — Progress Notes (Signed)
PROGRESS NOTE                                                                                                                                                                                                             Patient Demographics:    Kristen Bradley, is a 70 y.o. female, DOB - 03-Sep-1945, YYT:[id number]  Admit date - 01/29/2016   Admitting Physician Waldemar Dickens, MD  Outpatient Primary MD for the patient is Cammy Copa, MD  LOS - 2  Outpatient Specialists:None  Chief Complaint  Patient presents with  . Shortness of Breath       Brief Narrative  70 year old female with history of hypertension, hyperlipidemia, type 2 diabetes mellitus, hep C, PVD, carotid and the artery stenosis, CAD with prior PCI, tobacco use presented with acute onset of shortness of breath since the night prior to admission. This is progressive her sats were in the 80s when she arrived to the ED. She was placed initially on BiPAP and then transitioned to nasal cannula. She was given nebs as well as IV Solu Medrol and IV Lasix. Patient denied cough, fever, chills, headache, chest pain, palpitation, nausea, vomiting, abdominal pain, bowel or urinary symptoms. Blood will done in the ED showed WBC of 12.4 K elevated troponin of 0.07 which was subsequently further elevated up to 0.48, BNP of 1200 and potassium of 3.2. Patient placed on IV heparin for possible NSTEMI and acute CHF.    Subjective:   Reading better. Noted for episodes of small 2 seconds pauses and transient bradycardia with heart rate down to 40s (was found to be sleeping when heart rate went down)   Assessment  & Plan :    Principal Problem: Acute systolic CHF ([place]) Suspect ischemic cardiomyopathy. 2-D echo with EF of 25% and diffuse severe hypokinesis. Noted for LVH. Pulmonary artery pressure of 59 mmHg. Toprol dose reduce by cardiology given 2.2 second pause and  transient bradycardia. Not on ACEi/ ARB due to CK D. - Appears euvolemic on exam. Lasix discontinued.    Active Problems: NSTEMI Suspecting demand ischemia with cardiomyopathy. Continue heparin drip. Plan on cardiac catheterization on 1/2. Holding Plavix. Continue beta blocker (dose reduced)   ? Lobar pneumonia Seen on CXR. Only symptoms is acute shortness of breath and hypoxia. On empiric Rocephin and azithromycin.  Thrombocytopenia Got a call  back from her PCP. Her platelets dating back to almost a year have been in the range of 80-105. No prior evaluation or workup done. Send peripheral smear. Will consult hematology.    Peripheral vascular disease (HCC) On plavix and Pletal. Holding Plavix for cardiac cath.     Diabetes mellitus type 2 in nonobese (HCC) Metformin held. Monitor on sliding cell coverage. A1c of 6.9.  Acute on chronic kidney disease stage III (HCC) Held HCTZ and ARB as well as metformin. Discussed with PCP. Her creatinine was around 1.4 about 6 months back and has slowly worsened (last labs checked early this month and was 1.7) Will monitor closely and avoid nephrotoxic agents. Lasix discontinued today.  Hypokalemia Replenished     Code Status : full code  Family Communication  : son at bedside  Disposition Plan  : Pending hospital workup including cardiac cath.  Barriers For Discharge : Active symptoms  Consults  : Cardiology  Procedures  :   DVT Prophylaxis  : IV heparin  Lab Results  Component Value Date   PLT 85 (L) 01/31/2016    Antibiotics  :   Anti-infectives    Start     Dose/Rate Route Frequency Ordered Stop   01/30/16 0430  azithromycin (ZITHROMAX) 500 mg in dextrose 5 % 250 mL IVPB     500 mg 250 mL/hr over 60 Minutes Intravenous Every 24 hours 01/29/16 1119 02/05/16 0429   01/29/16 0830  cefTRIAXone (ROCEPHIN) 1 g in dextrose 5 % 50 mL IVPB  Status:  Discontinued     1 g 100 mL/hr over 30 Minutes Intravenous Every 24 hours  01/29/16 0823 01/29/16 1119   01/29/16 0830  azithromycin (ZITHROMAX) 500 mg in dextrose 5 % 250 mL IVPB  Status:  Discontinued     500 mg 250 mL/hr over 60 Minutes Intravenous Every 24 hours 01/29/16 0823 01/29/16 1119   01/29/16 0430  cefTRIAXone (ROCEPHIN) 1 g in dextrose 5 % 50 mL IVPB     1 g 100 mL/hr over 30 Minutes Intravenous Every 24 hours 01/29/16 1119 02/04/16 0429   01/29/16 0415  cefTRIAXone (ROCEPHIN) 1 g in dextrose 5 % 50 mL IVPB     1 g 100 mL/hr over 30 Minutes Intravenous  Once 01/29/16 0413 01/29/16 0500   01/29/16 0415  azithromycin (ZITHROMAX) 500 mg in dextrose 5 % 250 mL IVPB     500 mg 250 mL/hr over 60 Minutes Intravenous  Once 01/29/16 0413 01/29/16 0530        Objective:   Vitals:   01/30/16 2231 01/31/16 0547 01/31/16 0549 01/31/16 0937  BP: (!) 157/66  (!) 144/60 (!) 152/63  Pulse: (!) 57  (!) 56 (!) 53  Resp:   17   Temp:   98.7 F (37.1 C)   TempSrc:   Oral   SpO2:   99%   Weight:  55.8 kg (123 lb 1.6 oz)    Height:        Wt Readings from Last 3 Encounters:  01/31/16 55.8 kg (123 lb 1.6 oz)  09/19/15 56.7 kg (125 lb)  01/10/15 57 kg (125 lb 11.2 oz)     Intake/Output Summary (Last 24 hours) at 01/31/16 1353 Last data filed at 01/31/16 0557  Gross per 24 hour  Intake           534.34 ml  Output              550 ml  Net           -  15.66 ml     Physical Exam  Gen: not in distress HEENT:  moist mucosa, supple neck Chest: clear b/l, no added sounds CVS: N S1&S2, no murmurs, rubs or gallop GI: soft, NT, ND,  Musculoskeletal: warm, no edema     Data Review:    CBC  Recent Labs Lab 01/29/16 0229 01/29/16 0823 01/30/16 0330 01/31/16 0615  WBC 12.4* 6.1 6.9 3.7*  HGB 14.5 12.1 11.0* 11.7*  HCT 45.6 37.2 33.2* 36.9  PLT 148* 77* 81* 85*  MCV 92.7 88.8 87.6 90.9  MCH 29.5 28.9 29.0 28.8  MCHC 31.8 32.5 33.1 31.7  RDW 15.1 14.5 14.4 14.8    Chemistries   Recent Labs Lab 01/29/16 0229 01/29/16 0823 01/30/16 0330  01/31/16 0615  NA 139  --  137 140  K 3.2*  --  3.3* 3.6  CL 103  --  101 104  CO2 26  --  26 25  GLUCOSE 182*  --  124* 145*  BUN 18  --  24* 29*  CREATININE 1.66* 1.87* 1.78* 1.77*  CALCIUM 11.1*  --  10.2 10.6*  AST 30  --  20  --   ALT 19  --  14  --   ALKPHOS 107  --  69  --   BILITOT 0.6  --  0.8  --    ------------------------------------------------------------------------------------------------------------------ No results for input(s): CHOL, HDL, LDLCALC, TRIG, CHOLHDL, LDLDIRECT in the last 72 hours.  Lab Results  Component Value Date   HGBA1C 6.9 (H) 02/09/2012   ------------------------------------------------------------------------------------------------------------------  Recent Labs  01/29/16 1148  TSH 0.978   ------------------------------------------------------------------------------------------------------------------ No results for input(s): VITAMINB12, FOLATE, FERRITIN, TIBC, IRON, RETICCTPCT in the last 72 hours.  Coagulation profile No results for input(s): INR, PROTIME in the last 168 hours.   Recent Labs  01/30/16 1639  DDIMER 1.73*    Cardiac Enzymes  Recent Labs Lab 01/29/16 0229 01/29/16 1456 01/29/16 1931  TROPONINI 0.07* 0.48* 0.40*   ------------------------------------------------------------------------------------------------------------------    Component Value Date/Time   BNP 1,091.5 (H) 01/29/2016 0347    Inpatient Medications  Scheduled Meds: . amLODipine  10 mg Oral QHS  . aspirin EC  81 mg Oral Daily  . azithromycin  500 mg Intravenous Q24H  . cefTRIAXone (ROCEPHIN)  IV  1 g Intravenous Q24H  . cloNIDine  0.1 mg Oral BID  . heparin subcutaneous  5,000 Units Subcutaneous Q8H  . insulin aspart  0-9 Units Subcutaneous TID WC  . metoprolol succinate  50 mg Oral Daily  . simvastatin  40 mg Oral QHS   Continuous Infusions:  PRN Meds:.acetaminophen **OR** acetaminophen, albuterol, hydrALAZINE, ondansetron  **OR** ondansetron (ZOFRAN) IV, polyethylene glycol, traMADol  Micro Results Recent Results (from the past 240 hour(s))  Culture, sputum-assessment     Status: None   Collection Time: 01/29/16  8:23 AM  Result Value Ref Range Status   Specimen Description EXPECTORATED SPUTUM  Final   Special Requests NONE  Final   Sputum evaluation   Final    Sputum specimen not acceptable for testing.  Please recollect.   Results Called to: G. GLEASON, RN AT [place] ON 01/29/16 BY C. JESSUP, MLT.    Report Status 01/29/2016 FINAL  Final  Culture, blood (routine x 2) Call MD if unable to obtain prior to antibiotics being given     Status: None (Preliminary result)   Collection Time: 01/29/16  9:07 AM  Result Value Ref Range Status   Specimen Description BLOOD LEFT HAND  Final  Special Requests BOTTLES DRAWN AEROBIC AND ANAEROBIC  5CC  Final   Culture NO GROWTH 2 DAYS  Final   Report Status PENDING  Incomplete  Culture, blood (routine x 2) Call MD if unable to obtain prior to antibiotics being given     Status: None (Preliminary result)   Collection Time: 01/29/16 11:52 AM  Result Value Ref Range Status   Specimen Description BLOOD RIGHT HAND  Final   Special Requests BOTTLES DRAWN AEROBIC ONLY  5CC  Final   Culture NO GROWTH 2 DAYS  Final   Report Status PENDING  Incomplete    Radiology Reports Dg Chest Portable 1 View  Result Date: 01/29/2016 CLINICAL DATA:  Shortness of breath EXAM: PORTABLE CHEST 1 VIEW COMPARISON:  Chest CT 08/16/2013 FINDINGS: There is atherosclerotic calcification in the aortic arch. Cardiomediastinal contours are otherwise normal. There is increased airspace opacity in the right lower lobe. No pleural effusion or pneumothorax. IMPRESSION: Increased opacity in the right lower lobe is concerning for pneumonia. Electronically Signed   By: Ulyses Jarred M.D.   On: 01/29/2016 02:50    Time Spent in minutes  35   Louellen Molder M.D on 01/31/2016 at 1:53 PM  Between 7am to  7pm - Pager - [phone]  After 7pm go to [email or web address] - password [facility]  [facility] -  Office  [phone]

## 2016-01-31 NOTE — Progress Notes (Signed)
Patient had 2.21 second pause @ 0720 and HR dropped to 40 nonsustained. Infromed by tele @ 0740 and Dr. Clementeen Graham paged at [phone].

## 2016-02-01 ENCOUNTER — Other Ambulatory Visit: Payer: Self-pay | Admitting: Oncology

## 2016-02-01 DIAGNOSIS — D693 Immune thrombocytopenic purpura: Secondary | ICD-10-CM

## 2016-02-01 DIAGNOSIS — N179 Acute kidney failure, unspecified: Secondary | ICD-10-CM

## 2016-02-01 DIAGNOSIS — I255 Ischemic cardiomyopathy: Secondary | ICD-10-CM

## 2016-02-01 DIAGNOSIS — N183 Chronic kidney disease, stage 3 unspecified: Secondary | ICD-10-CM

## 2016-02-01 DIAGNOSIS — R001 Bradycardia, unspecified: Secondary | ICD-10-CM

## 2016-02-01 DIAGNOSIS — D696 Thrombocytopenia, unspecified: Secondary | ICD-10-CM

## 2016-02-01 LAB — TROPONIN I: Troponin I: 0.16 ng/mL (ref <0.03)

## 2016-02-01 LAB — BASIC METABOLIC PANEL
Anion gap: 9 (ref 5–15)
BUN: 27 mg/dL — AB (ref 6–20)
CHLORIDE: 104 mmol/L (ref 101–111)
CO2: 25 mmol/L (ref 22–32)
CREATININE: 1.82 mg/dL — AB (ref 0.44–1.00)
Calcium: 10.2 mg/dL (ref 8.9–10.3)
GFR calc non Af Amer: 27 mL/min — ABNORMAL LOW (ref >60)
GFR, EST AFRICAN AMERICAN: 32 mL/min — AB (ref >60)
Glucose, Bld: 161 mg/dL — ABNORMAL HIGH (ref 65–99)
POTASSIUM: 3.6 mmol/L (ref 3.5–5.1)
Sodium: 138 mmol/L (ref 135–145)

## 2016-02-01 LAB — GLUCOSE, CAPILLARY
GLUCOSE-CAPILLARY: 167 mg/dL — AB (ref 65–99)
GLUCOSE-CAPILLARY: 128 mg/dL — AB (ref 65–99)
GLUCOSE-CAPILLARY: 113 mg/dL — AB (ref 65–99)
GLUCOSE-CAPILLARY: 126 mg/dL — AB (ref 65–99)

## 2016-02-01 LAB — PLATELET COUNT: Platelets: 68 10*3/uL — ABNORMAL LOW (ref 150–400)

## 2016-02-01 MED ORDER — METOPROLOL SUCCINATE ER 25 MG PO TB24
25.0000 mg | ORAL_TABLET | Freq: Every day | ORAL | Status: DC
  Administered 2016-02-02 – 2016-02-04 (×3): 25 mg via ORAL
  Filled 2016-02-01 (×3): qty 1

## 2016-02-01 MED ORDER — NITROGLYCERIN 0.4 MG SL SUBL
0.4000 mg | SUBLINGUAL_TABLET | SUBLINGUAL | Status: DC | PRN
  Administered 2016-02-01: 0.4 mg via SUBLINGUAL

## 2016-02-01 MED ORDER — SODIUM CHLORIDE 0.9 % IV SOLN
INTRAVENOUS | Status: DC
  Administered 2016-02-02: 50 mL/h via INTRAVENOUS

## 2016-02-01 MED ORDER — NITROGLYCERIN 0.4 MG SL SUBL
SUBLINGUAL_TABLET | SUBLINGUAL | Status: AC
  Administered 2016-02-01: 0.4 mg via SUBLINGUAL
  Filled 2016-02-01: qty 1

## 2016-02-01 NOTE — Progress Notes (Signed)
@  1951 paged Dr. Raiford Simmonds of Cardiology regarding pt's complaint of new Sharp L Chest Pain 5/10 and unchanged STAT EKG (per protocol). Orders received fro SL Nitro and MD endorsed to also administer pt's PRN Ultram. Meds administered with resolution of Chest Pain fro remainder of shift.

## 2016-02-01 NOTE — Progress Notes (Signed)
Patient Name: Kristen Bradley Date of Encounter: 02/01/2016  Primary Cardiologist: Dr [facility] Problem List     Principal Problem:   Acute respiratory failure [facility]) Active Problems:   Peripheral vascular disease (HCC)   Carotid stenosis, bilateral-s/p Bilateral CEA   Atherosclerosis of native artery of extremity with intermittent claudication ([place])   Community acquired pneumonia   Hypertension   Acute kidney injury ([place])   Diabetes mellitus with complication ([place])   LBBB- new   Elevated troponin I level   Thrombocytopenia- plts 81K   Elevated brain natriuretic peptide (BNP) level   Renal failure syndrome   Sinus pause   NSTEMI (non-ST elevated myocardial infarction) (HCC)   Cardiomyopathy, ischemic     Subjective    no new complaints, no chest pain, no significant shortness of breath  Inpatient Medications    Scheduled Meds: . amLODipine  10 mg Oral QHS  . aspirin EC  81 mg Oral Daily  . azithromycin  500 mg Intravenous Q24H  . cefTRIAXone (ROCEPHIN)  IV  1 g Intravenous Q24H  . cloNIDine  0.1 mg Oral BID  . heparin subcutaneous  5,000 Units Subcutaneous Q8H  . insulin aspart  0-9 Units Subcutaneous TID WC  . [START ON 02/02/2016] metoprolol succinate  25 mg Oral Daily  . simvastatin  40 mg Oral QHS   Continuous Infusions: . [START ON 02/02/2016] sodium chloride     PRN Meds: acetaminophen **OR** acetaminophen, albuterol, hydrALAZINE, ondansetron **OR** ondansetron (ZOFRAN) IV, polyethylene glycol, traMADol   Vital Signs    Vitals:   01/31/16 1453 01/31/16 2126 02/01/16 0537 02/01/16 0858  BP: (!) 146/59 (!) 165/62 (!) 145/59 (!) 151/66  Pulse: (!) 50 (!) 55 (!) 51 (!) 50  Resp: 19 18    Temp: 98.6 F (37 C) 97.5 F (36.4 C) 98 F (36.7 C)   TempSrc:  Oral Oral   SpO2:  100% 100%   Weight:   131 lb 13.4 oz (59.8 kg)   Height:        Intake/Output Summary (Last 24 hours) at 02/01/16 1058 Last data filed at 02/01/16 1020  Gross per 24 hour    Intake              660 ml  Output             1350 ml  Net             -690 ml   Filed Weights   01/30/16 0320 01/31/16 0547 02/01/16 0537  Weight: 128 lb 12.8 oz (58.4 kg) 123 lb 1.6 oz (55.8 kg) 131 lb 13.4 oz (59.8 kg)    Physical Exam    GEN: Well nourished, well developed, in no acute distress.  HEENT: Grossly normal.  Neck: Supple, no JVD, carotid bruits, or masses. Cardiac: RRR, 2/6 murmur, no rubs, or gallops. No clubbing, cyanosis, edema.  Radials/DP/PT 2+ and equal bilaterally.  Respiratory:  Respirations regular and unlabored, clear to auscultation bilaterally. GI: Soft, nontender, nondistended, BS + x 4. MS: no deformity or atrophy. Skin: warm and dry, no rash. Neuro:  Strength and sensation are intact. Psych: AAOx3.  Normal affect.  Labs    CBC  Recent Labs  01/30/16 0330 01/31/16 0615 02/01/16 0448  WBC 6.9 3.7*  --   HGB 11.0* 11.7*  --   HCT 33.2* 36.9  --   MCV 87.6 90.9  --   PLT 81* 85* 68*   Basic Metabolic Panel  Recent  Labs  01/31/16 0615 02/01/16 0753  NA 140 138  K 3.6 3.6  CL 104 104  CO2 25 25  GLUCOSE 145* 161*  BUN 29* 27*  CREATININE 1.77* 1.82*  CALCIUM 10.6* 10.2   Liver Function Tests  Recent Labs  01/30/16 0330  AST 20  ALT 14  ALKPHOS 69  BILITOT 0.8  PROT 6.3*  ALBUMIN 3.2*   No results for input(s): LIPASE, AMYLASE in the last 72 hours. Cardiac Enzymes  Recent Labs  01/29/16 1456 01/29/16 1931  TROPONINI 0.48* 0.40*   BNP Invalid input(s): POCBNP D-Dimer  Recent Labs  01/30/16 1639  DDIMER 1.73*   Hemoglobin A1C No results for input(s): HGBA1C in the last 72 hours. Fasting Lipid Panel No results for input(s): CHOL, HDL, LDLCALC, TRIG, CHOLHDL, LDLDIRECT in the last 72 hours. Thyroid Function Tests  Recent Labs  01/29/16 1148  TSH 0.978    Telemetry    2.2 second pause noted yesterday evening on telemetry, sinus rhythm/sinus bradycardia, underlying left bundle branch block -  Personally Reviewed  ECG    Normal sinus rhythm, left bundle branch block - Personally Reviewed  Radiology    No results found.  Cardiac Studies   ECHO 01/30/16: - Normal LV size with mild LV hypertrophy. EF 25% with severe   diffuse hypokinesis and septal-lateral dyssynchrony. Restrictive   diastolic function. Normal RV size with mildly decreased systolic   function. Moderate pulmonary hypertension. Mild to moderate MR.   Dilated IVC suggestive of elevated RV filling pressure.  Patient Profile     70 year old with newly discovered acute systolic heart failure/dilated cardiomyopathy EF 25% with history of peripheral vascular disease status post right renal artery stenting 2008, right and left carotid endarterectomy in 2013, 2014, lower extremity claudication with ABIs of 0.5 and 0.45 right and left followed by Dr. [name] with prior normal echocardiogram in 2013 admitted with acute shortness of breath, mildly elevated troponin of 0.48, new left bundle branch block.  Assessment & Plan    Acute systolic heart failure/dilated cardiomyopathy  - Mildly elevated troponin consistent with demand ischemia in the setting  - Optimizing medications, given her 2.2 second pause, I will decrease her Toprol to 25 mg a day.  - Avoiding ACE inhibitor/ARB given creatinine of 1.7.  -  She appears fairly euvolemic at this point, comfortable, no orthopnea. I stopped her Lasix in anticipation of cardiac catheterization on Tuesday to discover whether or not she has coronary artery disease contributing to her markedly reduced ejection fraction. Given her severe peripheral vascular disease this may certainly be an issue.   - I've also stopped her Plavix 75 mg on 01/30/16 just in case her cardiac catheterization reveals triple-vessel disease which would require bypass surgery.  - I stopped IV heparin use after 48 hrs of use. Obviously if she has any anginal symptoms we can always restart prior to cardiac  catheterization.  Sinus pause 2.2 seconds 01/30/16 and 12/30  - Decreased metoprolol to 25 mg.   Acute kidney injury  - Creatinine was 0.74 in 2013, 1.25 in 2014 and 1.66 on admission 01/29/16.  - Stopped Lasix 12/30.  - Trying to optimize prior to cardiac catheterization hopefully Tuesday.  Thrombocytopenia  - chronic 38's  - Appreciate Dr. Jana Hakim consult  - He said ok to use antiplts if needed. Should be OK for cath.    Signed, Candee Furbish, MD  02/01/2016, 10:58 AM

## 2016-02-01 NOTE — Progress Notes (Signed)
Kristen Bradley   DOB:1945-08-03   EG#:[id number]   VPX#:[id number]  Subjective: ambulating in room, agitating to go home; son in room   Objective: middle-aged African American woman Vitals:   02/01/16 0537 02/01/16 0858  BP: (!) 145/59 (!) 151/66  Pulse: (!) 51 (!) 50  Resp:    Temp: 98 F (36.7 C)     Body mass index is 21.94 kg/m.  Intake/Output Summary (Last 24 hours) at 02/01/16 1023 Last data filed at 02/01/16 1020  Gross per 24 hour  Intake              660 ml  Output             1350 ml  Net             -690 ml    Exam not repeated today  CBG (last 3)   Recent Labs  01/31/16 1642 01/31/16 2125 02/01/16 0755  GLUCAP 133* 144* 167*     Labs:  Lab Results  Component Value Date   WBC 3.7 (L) 01/31/2016   HGB 11.7 (L) 01/31/2016   HCT 36.9 01/31/2016   MCV 90.9 01/31/2016   PLT 68 (L) 02/01/2016   NEUTROABS 7.5 03/22/2009    @LASTCHEMISTRY @  Urine Studies No results for input(s): UHGB, CRYS in the last 72 hours.  Invalid input(s): UACOL, UAPR, USPG, UPH, UTP, UGL, UKET, UBIL, UNIT, UROB, [place], UEPI, UWBC, Kristen Bradley, [place]  Basic Metabolic Panel:  Recent Labs Lab 01/29/16 0229 01/29/16 0823 01/30/16 0330 01/31/16 0615 02/01/16 0753  NA 139  --  137 140 138  K 3.2*  --  3.3* 3.6 3.6  CL 103  --  101 104 104  CO2 26  --  26 25 25   GLUCOSE 182*  --  124* 145* 161*  BUN 18  --  24* 29* 27*  CREATININE 1.66* 1.87* 1.78* 1.77* 1.82*  CALCIUM 11.1*  --  10.2 10.6* 10.2   GFR Estimated Creatinine Clearance: 26.3 mL/min (by C-G formula based on SCr of 1.82 mg/dL (H)). Liver Function Tests:  Recent Labs Lab 01/29/16 0229 01/30/16 0330  AST 30 20  ALT 19 14  ALKPHOS 107 69  BILITOT 0.6 0.8  PROT 7.5 6.3*  ALBUMIN 3.7 3.2*   No results for input(s): LIPASE, AMYLASE in the last 168 hours. No results for input(s): AMMONIA in the last 168 hours. Coagulation profile No results for input(s): INR, PROTIME in the last 168  hours.  CBC:  Recent Labs Lab 01/29/16 0229 01/29/16 0823 01/30/16 0330 01/31/16 0615 02/01/16 0448  WBC 12.4* 6.1 6.9 3.7*  --   HGB 14.5 12.1 11.0* 11.7*  --   HCT 45.6 37.2 33.2* 36.9  --   MCV 92.7 88.8 87.6 90.9  --   PLT 148* 77* 81* 85* 68*   Cardiac Enzymes:  Recent Labs Lab 01/29/16 0229 01/29/16 1456 01/29/16 1931  TROPONINI 0.07* 0.48* 0.40*   BNP: Invalid input(s): POCBNP CBG:  Recent Labs Lab 01/31/16 0750 01/31/16 1215 01/31/16 1642 01/31/16 2125 02/01/16 0755  GLUCAP 149* 115* 133* 144* 167*   D-Dimer  Recent Labs  01/30/16 1639  DDIMER 1.73*   Hgb A1c No results for input(s): HGBA1C in the last 72 hours. Lipid Profile No results for input(s): CHOL, HDL, LDLCALC, TRIG, CHOLHDL, LDLDIRECT in the last 72 hours. Thyroid function studies  Recent Labs  01/29/16 1148  TSH 0.978   Anemia work up No results for input(s): VITAMINB12, FOLATE,  FERRITIN, TIBC, IRON, RETICCTPCT in the last 72 hours. Microbiology Recent Results (from the past 240 hour(s))  Culture, sputum-assessment     Status: None   Collection Time: 01/29/16  8:23 AM  Result Value Ref Range Status   Specimen Description EXPECTORATED SPUTUM  Final   Special Requests NONE  Final   Sputum evaluation   Final    Sputum specimen not acceptable for testing.  Please recollect.   Results Called to: G. GLEASON, RN AT [place] ON 01/29/16 BY C. JESSUP, MLT.    Report Status 01/29/2016 FINAL  Final  Culture, blood (routine x 2) Call MD if unable to obtain prior to antibiotics being given     Status: None (Preliminary result)   Collection Time: 01/29/16  9:07 AM  Result Value Ref Range Status   Specimen Description BLOOD LEFT HAND  Final   Special Requests BOTTLES DRAWN AEROBIC AND ANAEROBIC  5CC  Final   Culture NO GROWTH 2 DAYS  Final   Report Status PENDING  Incomplete  Culture, blood (routine x 2) Call MD if unable to obtain prior to antibiotics being given     Status: None  (Preliminary result)   Collection Time: 01/29/16 11:52 AM  Result Value Ref Range Status   Specimen Description BLOOD RIGHT HAND  Final   Special Requests BOTTLES DRAWN AEROBIC ONLY  5CC  Final   Culture NO GROWTH 2 DAYS  Final   Report Status PENDING  Incomplete      Studies:  No results found.  Assessment: 70 y.o. [place] woman with chronic moderate thrombocytopenia in setting of acute CHF with cardiac cath planned for 02/03/2016  (1) Review of blood film 01/31/2016 shows no schistocytes. There are no obvious artefactual platelet clumps. White cell series is bland. --No evidence of TTP/HUS or DIC clinically or by smear review. HIT is not going to be the cause as the patient was not on heparin on admission or on prior occasions when her platelet count has been low. I do not palpate an enlarged spleen and spleen was normal on CT abd 07/30/2013. Overall workup is most c/w moderate ITP and that is the working diagnosis     Plan: I discussed the pathophysiology of ITP with the patient and her son this AM. They understand in general so long as counts remain >50K no intervention is needed.   She should be able to tolerate anti-platelet treatment with her current platelet counts.  If cardiology determines a higher platelet count would be advisable before procedure this coming week the easiest solution would be platelet transfusion pre-procedure. If this is done a post-transfusion platelet count would be useful to confirm response. Otherwise we can initiate ITP treatment but would not expect results in a few days.  I will arrange for follow-up of Kristen Bradley in my office in 3 weeks or so for long-term follow-up. Please let me know if I can be of further help at this point.  Kristen Cruel, MD 02/01/2016  10:23 AM Medical Oncology and Hematology [facility] [address] [place], [place] [address] Tel. [phone]    Fax. [phone]

## 2016-02-01 NOTE — Progress Notes (Addendum)
PROGRESS NOTE                                                                                                                                                                                                             Patient Demographics:    Kristen Bradley, is a 70 y.o. female, DOB - [birth year], OBS:[id number]  Admit date - 01/29/2016   Admitting Physician Waldemar Dickens, MD  Outpatient Primary MD for the patient is Cammy Copa, MD  LOS - 3  Outpatient Specialists:None  Chief Complaint  Patient presents with  . Shortness of Breath       Brief Narrative  70 year old female with history of hypertension, hyperlipidemia, type 2 diabetes mellitus, hep C, PVD, carotid and the artery stenosis, CAD with prior PCI, tobacco use presented with acute onset of shortness of breath since the night prior to admission. This is progressive her sats were in the 80s when she arrived to the ED. She was placed initially on BiPAP and then transitioned to nasal cannula. She was given nebs as well as IV Solu Medrol and IV Lasix. Patient denied cough, fever, chills, headache, chest pain, palpitation, nausea, vomiting, abdominal pain, bowel or urinary symptoms. Blood will done in the ED showed WBC of 12.4 K elevated troponin of 0.07 which was subsequently further elevated up to 0.48, BNP of 1200 and potassium of 3.2. Patient placed on IV heparin for possible NSTEMI and acute CHF.    Subjective:   Feeling better. Occasions drop in HR to 40s and short pauses.   Assessment  & Plan :    Principal Problem: Acute systolic CHF (HCC) Suspect ischemic cardiomyopathy. 2-D echo with EF of 25% and diffuse severe hypokinesis. Noted for LVH. Pulmonary artery pressure of 59 mmHg. Toprol dose reduce by cardiology given 2.2 second pause and transient bradycardia. Not on ACEi/ ARB due to CK D. - Appears euvolemic on exam. Lasix discontinued.      Active Problems: NSTEMI Suspecting demand ischemia with cardiomyopathy. Continue heparin drip. Plan on cardiac catheterization on 1/2. Holding Plavix. Continue beta blocker (dose reduced)   ? Lobar pneumonia Seen on CXR. Only symptoms is acute shortness of breath and hypoxia. On empiric Rocephin and azithromycin.  Thrombocytopenia As per PCP platelets dating back to almost a year have been in the range of 80-105. No prior evaluation  or workup done. Peripheral smear reviewed by hematologist, negative for sister sites. Suspects moderate ITP. Recommend he to use antiplatelets with current level of platelets. No intervention needed as long as platelet count >50 K and if needed prior to cardiac cath she can be transfused some platelets.  Sinus bradycardia with short pauses Total was switched to Toprol. Dose reduced yesterday for sinus bradycardia and short pauses. Still has periodic bradycardia to 40s. Dose further reduced to 25 mg daily. Monitor on telemetry.   Peripheral vascular disease (HCC) On plavix and Pletal. Holding Plavix for cardiac cath.     Diabetes mellitus type 2 in nonobese (HCC) Metformin held. Stable on sliding cell coverage. A1c of 6.9.  Acute on chronic kidney disease stage III (HCC) Held HCTZ and ARB as well as metformin. Discussed with PCP. Her creatinine was around 1.4 about 6 months back and has slowly worsened (last labs checked early this month and was 1.7) Continue to monitor and avoid nephrotoxic agents.  Hypokalemia Replenished     Code Status : full code  Family Communication  : son at bedside  Disposition Plan  : Cardiac cath plan for 1/2.  Barriers For Discharge : Active symptoms  Consults  :  Cardiology Hematology (Dr. Jana Hakim)  Procedures  :  2d echo  DVT Prophylaxis  : IV heparin  Lab Results  Component Value Date   PLT 68 (L) 02/01/2016    Antibiotics  :   Anti-infectives    Start     Dose/Rate Route Frequency Ordered  Stop   01/30/16 0430  azithromycin (ZITHROMAX) 500 mg in dextrose 5 % 250 mL IVPB     500 mg 250 mL/hr over 60 Minutes Intravenous Every 24 hours 01/29/16 1119 02/05/16 0429   01/29/16 0830  cefTRIAXone (ROCEPHIN) 1 g in dextrose 5 % 50 mL IVPB  Status:  Discontinued     1 g 100 mL/hr over 30 Minutes Intravenous Every 24 hours 01/29/16 0823 01/29/16 1119   01/29/16 0830  azithromycin (ZITHROMAX) 500 mg in dextrose 5 % 250 mL IVPB  Status:  Discontinued     500 mg 250 mL/hr over 60 Minutes Intravenous Every 24 hours 01/29/16 0823 01/29/16 1119   01/29/16 0430  cefTRIAXone (ROCEPHIN) 1 g in dextrose 5 % 50 mL IVPB     1 g 100 mL/hr over 30 Minutes Intravenous Every 24 hours 01/29/16 1119 02/04/16 0429   01/29/16 0415  cefTRIAXone (ROCEPHIN) 1 g in dextrose 5 % 50 mL IVPB     1 g 100 mL/hr over 30 Minutes Intravenous  Once 01/29/16 0413 01/29/16 0500   01/29/16 0415  azithromycin (ZITHROMAX) 500 mg in dextrose 5 % 250 mL IVPB     500 mg 250 mL/hr over 60 Minutes Intravenous  Once 01/29/16 0413 01/29/16 0530        Objective:   Vitals:   01/31/16 1453 01/31/16 2126 02/01/16 0537 02/01/16 0858  BP: (!) 146/59 (!) 165/62 (!) 145/59 (!) 151/66  Pulse: (!) 50 (!) 55 (!) 51 (!) 50  Resp: 19 18    Temp: 98.6 F (37 C) 97.5 F (36.4 C) 98 F (36.7 C)   TempSrc:  Oral Oral   SpO2:  100% 100%   Weight:   59.8 kg (131 lb 13.4 oz)   Height:        Wt Readings from Last 3 Encounters:  02/01/16 59.8 kg (131 lb 13.4 oz)  09/19/15 56.7 kg (125 lb)  01/10/15 57 kg (125  lb 11.2 oz)     Intake/Output Summary (Last 24 hours) at 02/01/16 1103 Last data filed at 02/01/16 1020  Gross per 24 hour  Intake              660 ml  Output             1350 ml  Net             -690 ml     Physical Exam  Gen: not in distress HEENT:  moist mucosa, supple neck Chest: clear b/l, no added sounds CVS: N S1&S2, no murmurs, rubs or gallop GI: soft, NT, ND,  Musculoskeletal: warm, no edema      Data Review:    CBC  Recent Labs Lab 01/29/16 0229 01/29/16 0823 01/30/16 0330 01/31/16 0615 02/01/16 0448  WBC 12.4* 6.1 6.9 3.7*  --   HGB 14.5 12.1 11.0* 11.7*  --   HCT 45.6 37.2 33.2* 36.9  --   PLT 148* 77* 81* 85* 68*  MCV 92.7 88.8 87.6 90.9  --   MCH 29.5 28.9 29.0 28.8  --   MCHC 31.8 32.5 33.1 31.7  --   RDW 15.1 14.5 14.4 14.8  --     Chemistries   Recent Labs Lab 01/29/16 0229 01/29/16 0823 01/30/16 0330 01/31/16 0615 02/01/16 0753  NA 139  --  137 140 138  K 3.2*  --  3.3* 3.6 3.6  CL 103  --  101 104 104  CO2 26  --  26 25 25   GLUCOSE 182*  --  124* 145* 161*  BUN 18  --  24* 29* 27*  CREATININE 1.66* 1.87* 1.78* 1.77* 1.82*  CALCIUM 11.1*  --  10.2 10.6* 10.2  AST 30  --  20  --   --   ALT 19  --  14  --   --   ALKPHOS 107  --  69  --   --   BILITOT 0.6  --  0.8  --   --    ------------------------------------------------------------------------------------------------------------------ No results for input(s): CHOL, HDL, LDLCALC, TRIG, CHOLHDL, LDLDIRECT in the last 72 hours.  Lab Results  Component Value Date   HGBA1C 6.9 (H) 02/09/2012   ------------------------------------------------------------------------------------------------------------------  Recent Labs  01/29/16 1148  TSH 0.978   ------------------------------------------------------------------------------------------------------------------ No results for input(s): VITAMINB12, FOLATE, FERRITIN, TIBC, IRON, RETICCTPCT in the last 72 hours.  Coagulation profile No results for input(s): INR, PROTIME in the last 168 hours.   Recent Labs  01/30/16 1639  DDIMER 1.73*    Cardiac Enzymes  Recent Labs Lab 01/29/16 0229 01/29/16 1456 01/29/16 1931  TROPONINI 0.07* 0.48* 0.40*   ------------------------------------------------------------------------------------------------------------------    Component Value Date/Time   BNP 1,091.5 (H) 01/29/2016 0347     Inpatient Medications  Scheduled Meds: . amLODipine  10 mg Oral QHS  . aspirin EC  81 mg Oral Daily  . azithromycin  500 mg Intravenous Q24H  . cefTRIAXone (ROCEPHIN)  IV  1 g Intravenous Q24H  . cloNIDine  0.1 mg Oral BID  . heparin subcutaneous  5,000 Units Subcutaneous Q8H  . insulin aspart  0-9 Units Subcutaneous TID WC  . [START ON 02/02/2016] metoprolol succinate  25 mg Oral Daily  . simvastatin  40 mg Oral QHS   Continuous Infusions: . [START ON 02/02/2016] sodium chloride     PRN Meds:.acetaminophen **OR** acetaminophen, albuterol, hydrALAZINE, ondansetron **OR** ondansetron (ZOFRAN) IV, polyethylene glycol, traMADol  Micro Results Recent Results (from the past 240  hour(s))  Culture, sputum-assessment     Status: None   Collection Time: 01/29/16  8:23 AM  Result Value Ref Range Status   Specimen Description EXPECTORATED SPUTUM  Final   Special Requests NONE  Final   Sputum evaluation   Final    Sputum specimen not acceptable for testing.  Please recollect.   Results Called to: G. GLEASON, RN AT [place] ON 01/29/16 BY C. JESSUP, MLT.    Report Status 01/29/2016 FINAL  Final  Culture, blood (routine x 2) Call MD if unable to obtain prior to antibiotics being given     Status: None (Preliminary result)   Collection Time: 01/29/16  9:07 AM  Result Value Ref Range Status   Specimen Description BLOOD LEFT HAND  Final   Special Requests BOTTLES DRAWN AEROBIC AND ANAEROBIC  5CC  Final   Culture NO GROWTH 2 DAYS  Final   Report Status PENDING  Incomplete  Culture, blood (routine x 2) Call MD if unable to obtain prior to antibiotics being given     Status: None (Preliminary result)   Collection Time: 01/29/16 11:52 AM  Result Value Ref Range Status   Specimen Description BLOOD RIGHT HAND  Final   Special Requests BOTTLES DRAWN AEROBIC ONLY  5CC  Final   Culture NO GROWTH 2 DAYS  Final   Report Status PENDING  Incomplete    Radiology Reports Dg Chest Portable 1  View  Result Date: 01/29/2016 CLINICAL DATA:  Shortness of breath EXAM: PORTABLE CHEST 1 VIEW COMPARISON:  Chest CT 08/16/2013 FINDINGS: There is atherosclerotic calcification in the aortic arch. Cardiomediastinal contours are otherwise normal. There is increased airspace opacity in the right lower lobe. No pleural effusion or pneumothorax. IMPRESSION: Increased opacity in the right lower lobe is concerning for pneumonia. Electronically Signed   By: Ulyses Jarred M.D.   On: 01/29/2016 02:50    Time Spent in minutes  35   Louellen Molder M.D on 02/01/2016 at 11:03 AM  Between 7am to 7pm - Pager - [phone]  After 7pm go to [email or web address] - password [facility]  [facility] -  Office  [phone]

## 2016-02-01 NOTE — Progress Notes (Signed)
Pre-cath orders entered as per discussion with Dr. Marlou Porch. Dayna Dunn PA-C

## 2016-02-02 DIAGNOSIS — I5021 Acute systolic (congestive) heart failure: Secondary | ICD-10-CM

## 2016-02-02 LAB — BASIC METABOLIC PANEL
ANION GAP: 10 (ref 5–15)
BUN: 32 mg/dL — ABNORMAL HIGH (ref 6–20)
CALCIUM: 10 mg/dL (ref 8.9–10.3)
CO2: 25 mmol/L (ref 22–32)
Chloride: 103 mmol/L (ref 101–111)
Creatinine, Ser: 1.83 mg/dL — ABNORMAL HIGH (ref 0.44–1.00)
GFR, EST AFRICAN AMERICAN: 31 mL/min — AB (ref >60)
GFR, EST NON AFRICAN AMERICAN: 27 mL/min — AB (ref >60)
Glucose, Bld: 114 mg/dL — ABNORMAL HIGH (ref 65–99)
POTASSIUM: 3.7 mmol/L (ref 3.5–5.1)
Sodium: 138 mmol/L (ref 135–145)

## 2016-02-02 LAB — TROPONIN I
TROPONIN I: 0.14 ng/mL — AB (ref <0.03)
TROPONIN I: 0.12 ng/mL — AB (ref <0.03)

## 2016-02-02 LAB — CBC
HCT: 32.9 % — ABNORMAL LOW (ref 36.0–46.0)
Hemoglobin: 10.8 g/dL — ABNORMAL LOW (ref 12.0–15.0)
MCH: 29.6 pg (ref 26.0–34.0)
MCHC: 32.8 g/dL (ref 30.0–36.0)
MCV: 90.1 fL (ref 78.0–100.0)
Platelets: 77 10*3/uL — ABNORMAL LOW (ref 150–400)
RBC: 3.65 MIL/uL — AB (ref 3.87–5.11)
RDW: 14.4 % (ref 11.5–15.5)
WBC: 3.4 10*3/uL — ABNORMAL LOW (ref 4.0–10.5)

## 2016-02-02 LAB — PROTIME-INR
INR: 1
Prothrombin Time: 13.2 seconds (ref 11.4–15.2)

## 2016-02-02 LAB — GLUCOSE, CAPILLARY
GLUCOSE-CAPILLARY: 129 mg/dL — AB (ref 65–99)
Glucose-Capillary: 108 mg/dL — ABNORMAL HIGH (ref 65–99)
Glucose-Capillary: 112 mg/dL — ABNORMAL HIGH (ref 65–99)
Glucose-Capillary: 125 mg/dL — ABNORMAL HIGH (ref 65–99)

## 2016-02-02 MED ORDER — ASPIRIN 81 MG PO CHEW
81.0000 mg | CHEWABLE_TABLET | ORAL | Status: AC
  Administered 2016-02-03: 81 mg via ORAL
  Filled 2016-02-02: qty 1

## 2016-02-02 NOTE — Progress Notes (Signed)
PROGRESS NOTE                                                                                                                                                                                                             Patient Demographics:    Kristen Bradley, is a 71 y.o. female, DOB - [birth year], YBO:[id number]  Admit date - 01/29/2016   Admitting Physician Waldemar Dickens, MD  Outpatient Primary MD for the patient is Cammy Copa, MD  LOS - 4  Outpatient Specialists:None  Chief Complaint  Patient presents with  . Shortness of Breath       Brief Narrative  71 year old female with history of hypertension, hyperlipidemia, type 2 diabetes mellitus, hep C, PVD, carotid and the artery stenosis, CAD with prior PCI, tobacco use presented with acute onset of shortness of breath since the night prior to admission. This is progressive her sats were in the 80s when she arrived to the ED. She was placed initially on BiPAP and then transitioned to nasal cannula. She was given nebs as well as IV Solu Medrol and IV Lasix. Patient denied cough, fever, chills, headache, chest pain, palpitation, nausea, vomiting, abdominal pain, bowel or urinary symptoms. Blood will done in the ED showed WBC of 12.4 K elevated troponin of 0.07 which was subsequently further elevated up to 0.48, BNP of 1200 and potassium of 3.2. Patient placed on IV heparin for possible NSTEMI and acute CHF.    Subjective:   Denies any symptoms. Heart rate improved after reducing metoprolol dose.   Assessment  & Plan :    Principal Problem: Acute systolic CHF (HCC) Suspect ischemic cardiomyopathy. 2-D echo with EF of 25% and diffuse severe hypokinesis. Noted for LVH. Pulmonary artery pressure of 59 mmHg.  Not on ACEi/ ARB due to CK D. - Lasix discontinued due to underlying CK D and patient euvolemic. Toprol dose reduced due to sinus bradycardia.    Active  Problems: NSTEMI Suspecting demand ischemia with cardiomyopathy. Off heparin drip. cardiac catheterization scheduled for tomorrow. Holding Plavix. Continue low-dose beta blocker.   ? Lobar pneumonia Seen on CXR. Shortness of breath is possibly due to CHF. Would discontinue antibiotics (completes 5 days today)  Thrombocytopenia As per PCP platelets dating back to almost a year have been in the range of 80-105. No prior evaluation or workup done.  Peripheral smear reviewed by hematologist, negative for schistocytes. Suspects moderate ITP. Recommend he to use antiplatelets with current level of platelets,  No intervention needed as long as platelet count >50 K . Cardiology okay with current level of platelets for cardiac cath..  Sinus bradycardia with short pauses Improved after Toprol dose reduced.   Peripheral vascular disease (HCC) On plavix and Pletal. Holding Plavix for cardiac cath.     Diabetes mellitus type 2 in nonobese (HCC) Metformin held. Stable on sliding cell coverage. A1c of 6.9.  Acute on chronic kidney disease stage III (HCC) Held HCTZ and ARB as well as metformin. Discussed with PCP. Her creatinine was around 1.4 about 6 months back and has slowly worsened (last labs checked early this month and was 1.7) avoid nephrotoxic agents.  Hypokalemia Replenished     Code Status : full code  Family Communication  : none at bedside  Disposition Plan  : Cardiac cath plan for 1/2.  Barriers For Discharge : Active symptoms  Consults  :  Cardiology Hematology (Dr. Jana Hakim)  Procedures  :  2d echo  DVT Prophylaxis  : Sq heaprin  Lab Results  Component Value Date   PLT 77 (L) 02/02/2016    Antibiotics  :   Anti-infectives    Start     Dose/Rate Route Frequency Ordered Stop   01/30/16 0430  azithromycin (ZITHROMAX) 500 mg in dextrose 5 % 250 mL IVPB     500 mg 250 mL/hr over 60 Minutes Intravenous Every 24 hours 01/29/16 1119 02/05/16 0429   01/29/16 0830   cefTRIAXone (ROCEPHIN) 1 g in dextrose 5 % 50 mL IVPB  Status:  Discontinued     1 g 100 mL/hr over 30 Minutes Intravenous Every 24 hours 01/29/16 0823 01/29/16 1119   01/29/16 0830  azithromycin (ZITHROMAX) 500 mg in dextrose 5 % 250 mL IVPB  Status:  Discontinued     500 mg 250 mL/hr over 60 Minutes Intravenous Every 24 hours 01/29/16 0823 01/29/16 1119   01/29/16 0430  cefTRIAXone (ROCEPHIN) 1 g in dextrose 5 % 50 mL IVPB     1 g 100 mL/hr over 30 Minutes Intravenous Every 24 hours 01/29/16 1119 02/04/16 0429   01/29/16 0415  cefTRIAXone (ROCEPHIN) 1 g in dextrose 5 % 50 mL IVPB     1 g 100 mL/hr over 30 Minutes Intravenous  Once 01/29/16 0413 01/29/16 0500   01/29/16 0415  azithromycin (ZITHROMAX) 500 mg in dextrose 5 % 250 mL IVPB     500 mg 250 mL/hr over 60 Minutes Intravenous  Once 01/29/16 0413 01/29/16 0530        Objective:   Vitals:   02/01/16 2142 02/01/16 2219 02/01/16 2325 02/02/16 0544  BP: (!) 162/69 (!) 169/72 (!) 162/78 (!) 130/54  Pulse: (!) 56   (!) 55  Resp: 18   18  Temp: 98.2 F (36.8 C)   98.1 F (36.7 C)  TempSrc: Oral   Oral  SpO2: 100%   100%  Weight:    58.1 kg (128 lb 1.4 oz)  Height:        Wt Readings from Last 3 Encounters:  02/02/16 58.1 kg (128 lb 1.4 oz)  09/19/15 56.7 kg (125 lb)  01/10/15 57 kg (125 lb 11.2 oz)     Intake/Output Summary (Last 24 hours) at 02/02/16 1042 Last data filed at 02/02/16 0600  Gross per 24 hour  Intake  880 ml  Output                0 ml  Net              880 ml     Physical Exam  Gen: not in distress HEENT:  moist mucosa, supple neck Chest: clear b/l, no added sounds CVS: N S1&S2, no murmurs, rubs or gallop GI: soft, NT, ND,  Musculoskeletal: warm, no edema     Data Review:    CBC  Recent Labs Lab 01/29/16 0229 01/29/16 0823 01/30/16 0330 01/31/16 0615 02/01/16 0448 02/02/16 0311  WBC 12.4* 6.1 6.9 3.7*  --  3.4*  HGB 14.5 12.1 11.0* 11.7*  --  10.8*  HCT 45.6 37.2  33.2* 36.9  --  32.9*  PLT 148* 77* 81* 85* 68* 77*  MCV 92.7 88.8 87.6 90.9  --  90.1  MCH 29.5 28.9 29.0 28.8  --  29.6  MCHC 31.8 32.5 33.1 31.7  --  32.8  RDW 15.1 14.5 14.4 14.8  --  14.4    Chemistries   Recent Labs Lab 01/29/16 0229 01/29/16 0823 01/30/16 0330 01/31/16 0615 02/01/16 0753 02/02/16 0311  NA 139  --  137 140 138 138  K 3.2*  --  3.3* 3.6 3.6 3.7  CL 103  --  101 104 104 103  CO2 26  --  26 25 25 25   GLUCOSE 182*  --  124* 145* 161* 114*  BUN 18  --  24* 29* 27* 32*  CREATININE 1.66* 1.87* 1.78* 1.77* 1.82* 1.83*  CALCIUM 11.1*  --  10.2 10.6* 10.2 10.0  AST 30  --  20  --   --   --   ALT 19  --  14  --   --   --   ALKPHOS 107  --  69  --   --   --   BILITOT 0.6  --  0.8  --   --   --    ------------------------------------------------------------------------------------------------------------------ No results for input(s): CHOL, HDL, LDLCALC, TRIG, CHOLHDL, LDLDIRECT in the last 72 hours.  Lab Results  Component Value Date   HGBA1C 6.9 (H) 02/09/2012   ------------------------------------------------------------------------------------------------------------------ No results for input(s): TSH, T4TOTAL, T3FREE, THYROIDAB in the last 72 hours.  Invalid input(s): FREET3 ------------------------------------------------------------------------------------------------------------------ No results for input(s): VITAMINB12, FOLATE, FERRITIN, TIBC, IRON, RETICCTPCT in the last 72 hours.  Coagulation profile No results for input(s): INR, PROTIME in the last 168 hours.   Recent Labs  01/30/16 1639  DDIMER 1.73*    Cardiac Enzymes  Recent Labs Lab 01/29/16 1931 02/01/16 2121 02/02/16 0311  TROPONINI 0.40* 0.16* 0.14*   ------------------------------------------------------------------------------------------------------------------    Component Value Date/Time   BNP 1,091.5 (H) 01/29/2016 0347    Inpatient Medications  Scheduled  Meds: . amLODipine  10 mg Oral QHS  . aspirin EC  81 mg Oral Daily  . azithromycin  500 mg Intravenous Q24H  . cefTRIAXone (ROCEPHIN)  IV  1 g Intravenous Q24H  . cloNIDine  0.1 mg Oral BID  . heparin subcutaneous  5,000 Units Subcutaneous Q8H  . insulin aspart  0-9 Units Subcutaneous TID WC  . metoprolol succinate  25 mg Oral Daily  . simvastatin  40 mg Oral QHS   Continuous Infusions: . sodium chloride     PRN Meds:.acetaminophen **OR** acetaminophen, albuterol, hydrALAZINE, nitroGLYCERIN, ondansetron **OR** ondansetron (ZOFRAN) IV, polyethylene glycol, traMADol  Micro Results Recent Results (from the past 240 hour(s))  Culture, sputum-assessment  Status: None   Collection Time: 01/29/16  8:23 AM  Result Value Ref Range Status   Specimen Description EXPECTORATED SPUTUM  Final   Special Requests NONE  Final   Sputum evaluation   Final    Sputum specimen not acceptable for testing.  Please recollect.   Results Called to: G. GLEASON, RN AT [facility] ON 01/29/16 BY C. JESSUP, MLT.    Report Status 01/29/2016 FINAL  Final  Culture, blood (routine x 2) Call MD if unable to obtain prior to antibiotics being given     Status: None (Preliminary result)   Collection Time: 01/29/16  9:07 AM  Result Value Ref Range Status   Specimen Description BLOOD LEFT HAND  Final   Special Requests BOTTLES DRAWN AEROBIC AND ANAEROBIC  5CC  Final   Culture NO GROWTH 3 DAYS  Final   Report Status PENDING  Incomplete  Culture, blood (routine x 2) Call MD if unable to obtain prior to antibiotics being given     Status: None (Preliminary result)   Collection Time: 01/29/16 11:52 AM  Result Value Ref Range Status   Specimen Description BLOOD RIGHT HAND  Final   Special Requests BOTTLES DRAWN AEROBIC ONLY  5CC  Final   Culture NO GROWTH 3 DAYS  Final   Report Status PENDING  Incomplete    Radiology Reports Dg Chest Portable 1 View  Result Date: 01/29/2016 CLINICAL DATA:  Shortness of breath EXAM:  PORTABLE CHEST 1 VIEW COMPARISON:  Chest CT 08/16/2013 FINDINGS: There is atherosclerotic calcification in the aortic arch. Cardiomediastinal contours are otherwise normal. There is increased airspace opacity in the right lower lobe. No pleural effusion or pneumothorax. IMPRESSION: Increased opacity in the right lower lobe is concerning for pneumonia. Electronically Signed   By: Ulyses Jarred M.D.   On: 01/29/2016 02:50    Time Spent in minutes  25   Louellen Molder M.D on 02/02/2016 at 10:42 AM  Between 7am to 7pm - Pager - [phone]  After 7pm go to [email or web address] - password [facility]  [facility] -  Office  [phone]

## 2016-02-02 NOTE — Progress Notes (Signed)
     SUBJECTIVE: No chest pain.   Tele: sinus  BP (!) 130/54 (BP Location: Left Arm)   Pulse (!) 55   Temp 98.1 F (36.7 C) (Oral)   Resp 18   Ht 5\' 5"  (1.651 m)   Wt 128 lb 1.4 oz (58.1 kg)   SpO2 100%   BMI 21.31 kg/m   Intake/Output Summary (Last 24 hours) at 02/02/16 0817 Last data filed at 02/02/16 0600  Gross per 24 hour  Intake             1240 ml  Output                0 ml  Net             1240 ml    PHYSICAL EXAM General: Well developed, well nourished, in no acute distress. Alert and oriented x 3.  Psych:  Good affect, responds appropriately Neck: No JVD. No masses noted.  Lungs: Clear bilaterally with no wheezes or rhonci noted.  Heart: RRR with no murmurs noted. Abdomen: Bowel sounds are present. Soft, non-tender.  Extremities: No lower extremity edema.   LABS: Basic Metabolic Panel:  Recent Labs  02/01/16 0753 02/02/16 0311  NA 138 138  K 3.6 3.7  CL 104 103  CO2 25 25  GLUCOSE 161* 114*  BUN 27* 32*  CREATININE 1.82* 1.83*  CALCIUM 10.2 10.0   CBC:  Recent Labs  01/31/16 0615 02/01/16 0448 02/02/16 0311  WBC 3.7*  --  3.4*  HGB 11.7*  --  10.8*  HCT 36.9  --  32.9*  MCV 90.9  --  90.1  PLT 85* 68* 77*   Cardiac Enzymes:  Recent Labs  02/01/16 2121 02/02/16 0311  TROPONINI 0.16* 0.14*   Current Meds: . amLODipine  10 mg Oral QHS  . aspirin EC  81 mg Oral Daily  . azithromycin  500 mg Intravenous Q24H  . cefTRIAXone (ROCEPHIN)  IV  1 g Intravenous Q24H  . cloNIDine  0.1 mg Oral BID  . heparin subcutaneous  5,000 Units Subcutaneous Q8H  . insulin aspart  0-9 Units Subcutaneous TID WC  . metoprolol succinate  25 mg Oral Daily  . simvastatin  40 mg Oral QHS     ASSESSMENT AND PLAN: 71 year old female with newly discovered acute systolic heart failure/dilated cardiomyopathy EF 25% with history of peripheral vascular disease status post right renal artery stenting 2008, right and left carotid endarterectomy in 2013, 2014,  lower extremity claudication with ABIs of 0.5 and 0.45 right and left followed by Dr. [name] with prior normal echocardiogram in 2013 admitted with acute shortness of breath, mildly elevated troponin of 0.48, new left bundle branch block.  1. Acute systolic heart failure/dilated cardiomyopathy: Mildly elevated troponin consistent with demand ischemia. Plans for cath tomorrow. NPO tonight. No ACE inhibitor/ARB given creatinine of 1.8. Plavix on hold prior to cath in case bypass is necessary.   2. Acute on chronic kidney disease: Renal function stable this am.    3. Thrombocytopenia, chronic. Heme consult. OK to proceed with cath with anti-platelet therapy if necessary.    [name]  1/1/20188:17 AM

## 2016-02-03 ENCOUNTER — Encounter (HOSPITAL_COMMUNITY)
Admission: EM | Disposition: A | Discharge status: Home / Self Care | Payer: Self-pay | Source: Home / Self Care | Attending: Internal Medicine

## 2016-02-03 DIAGNOSIS — I214 Non-ST elevation (NSTEMI) myocardial infarction: Principal | ICD-10-CM

## 2016-02-03 DIAGNOSIS — R748 Abnormal levels of other serum enzymes: Secondary | ICD-10-CM

## 2016-02-03 HISTORY — PX: CARDIAC CATHETERIZATION: SHX172

## 2016-02-03 LAB — POCT I-STAT 3, VENOUS BLOOD GAS (G3P V)
Acid-base deficit: 3 mmol/L — ABNORMAL HIGH (ref 0.0–2.0)
BICARBONATE: 21.9 mmol/L (ref 20.0–28.0)
O2 Saturation: 62 %
PH VEN: 7.369 (ref 7.250–7.430)
PO2 VEN: 33 mmHg (ref 32.0–45.0)
TCO2: 23 mmol/L (ref 0–100)
pCO2, Ven: 38 mmHg — ABNORMAL LOW (ref 44.0–60.0)

## 2016-02-03 LAB — POCT I-STAT 3, ART BLOOD GAS (G3+)
ACID-BASE DEFICIT: 4 mmol/L — AB (ref 0.0–2.0)
Bicarbonate: 20.2 mmol/L (ref 20.0–28.0)
O2 Saturation: 93 %
PCO2 ART: 33.6 mmHg (ref 32.0–48.0)
PO2 ART: 69 mmHg — AB (ref 83.0–108.0)
TCO2: 21 mmol/L (ref 0–100)
pH, Arterial: 7.388 (ref 7.350–7.450)

## 2016-02-03 LAB — BASIC METABOLIC PANEL
ANION GAP: 7 (ref 5–15)
BUN: 32 mg/dL — ABNORMAL HIGH (ref 6–20)
CHLORIDE: 111 mmol/L (ref 101–111)
CO2: 23 mmol/L (ref 22–32)
Calcium: 9.8 mg/dL (ref 8.9–10.3)
Creatinine, Ser: 1.67 mg/dL — ABNORMAL HIGH (ref 0.44–1.00)
GFR calc non Af Amer: 30 mL/min — ABNORMAL LOW (ref >60)
GFR, EST AFRICAN AMERICAN: 35 mL/min — AB (ref >60)
GLUCOSE: 132 mg/dL — AB (ref 65–99)
Potassium: 4.8 mmol/L (ref 3.5–5.1)
Sodium: 141 mmol/L (ref 135–145)

## 2016-02-03 LAB — CULTURE, BLOOD (ROUTINE X 2)
CULTURE: NO GROWTH
CULTURE: NO GROWTH

## 2016-02-03 LAB — CBC
HEMATOCRIT: 36.4 % (ref 36.0–46.0)
Hemoglobin: 11.6 g/dL — ABNORMAL LOW (ref 12.0–15.0)
MCH: 29 pg (ref 26.0–34.0)
MCHC: 31.9 g/dL (ref 30.0–36.0)
MCV: 91 fL (ref 78.0–100.0)
Platelets: 99 10*3/uL — ABNORMAL LOW (ref 150–400)
RBC: 4 MIL/uL (ref 3.87–5.11)
RDW: 14.6 % (ref 11.5–15.5)
WBC: 3.1 10*3/uL — AB (ref 4.0–10.5)

## 2016-02-03 LAB — GLUCOSE, CAPILLARY
GLUCOSE-CAPILLARY: 113 mg/dL — AB (ref 65–99)
Glucose-Capillary: 137 mg/dL — ABNORMAL HIGH (ref 65–99)
Glucose-Capillary: 113 mg/dL — ABNORMAL HIGH (ref 65–99)
Glucose-Capillary: 109 mg/dL — ABNORMAL HIGH (ref 65–99)

## 2016-02-03 SURGERY — RIGHT/LEFT HEART CATH AND CORONARY ANGIOGRAPHY
Anesthesia: LOCAL

## 2016-02-03 MED ORDER — LIDOCAINE HCL (PF) 1 % IJ SOLN
INTRAMUSCULAR | Status: AC
  Filled 2016-02-03: qty 30

## 2016-02-03 MED ORDER — HEPARIN (PORCINE) IN NACL 2-0.9 UNIT/ML-% IJ SOLN
INTRAMUSCULAR | Status: AC
  Filled 2016-02-03: qty 1000

## 2016-02-03 MED ORDER — HYDRALAZINE HCL 20 MG/ML IJ SOLN
INTRAMUSCULAR | Status: AC
  Filled 2016-02-03: qty 1

## 2016-02-03 MED ORDER — LIDOCAINE HCL (PF) 1 % IJ SOLN
INTRAMUSCULAR | Status: DC | PRN
  Administered 2016-02-03 (×2): 2 mL via INTRADERMAL

## 2016-02-03 MED ORDER — FENTANYL CITRATE (PF) 100 MCG/2ML IJ SOLN
INTRAMUSCULAR | Status: AC
Start: 2016-02-03 — End: 2016-02-03
  Filled 2016-02-03: qty 2

## 2016-02-03 MED ORDER — MIDAZOLAM HCL 2 MG/2ML IJ SOLN
INTRAMUSCULAR | Status: AC
  Filled 2016-02-03: qty 2

## 2016-02-03 MED ORDER — SODIUM CHLORIDE 0.9% FLUSH
3.0000 mL | Freq: Two times a day (BID) | INTRAVENOUS | Status: DC
Start: 2016-02-03 — End: 2016-02-04

## 2016-02-03 MED ORDER — SODIUM CHLORIDE 0.9 % IV SOLN: 250.0000 mL | INTRAVENOUS | Status: DC | PRN

## 2016-02-03 MED ORDER — HEPARIN SODIUM (PORCINE) 1000 UNIT/ML IJ SOLN
INTRAMUSCULAR | Status: AC
  Filled 2016-02-03: qty 1

## 2016-02-03 MED ORDER — IOPAMIDOL (ISOVUE-370) INJECTION 76%
INTRAVENOUS | Status: DC | PRN
  Administered 2016-02-03: 80 mL via INTRA_ARTERIAL

## 2016-02-03 MED ORDER — VERAPAMIL HCL 2.5 MG/ML IV SOLN
INTRAVENOUS | Status: AC
  Filled 2016-02-03: qty 2

## 2016-02-03 MED ORDER — IOPAMIDOL (ISOVUE-370) INJECTION 76%
INTRAVENOUS | Status: AC
  Filled 2016-02-03: qty 100

## 2016-02-03 MED ORDER — MIDAZOLAM HCL 2 MG/2ML IJ SOLN
INTRAMUSCULAR | Status: DC | PRN
  Administered 2016-02-03 (×2): 1 mg via INTRAVENOUS

## 2016-02-03 MED ORDER — SODIUM CHLORIDE 0.9 % IV SOLN: INTRAVENOUS | Status: AC

## 2016-02-03 MED ORDER — HEPARIN (PORCINE) IN NACL 2-0.9 UNIT/ML-% IJ SOLN
INTRAMUSCULAR | Status: DC | PRN
  Administered 2016-02-03: 1000 mL

## 2016-02-03 MED ORDER — VERAPAMIL HCL 2.5 MG/ML IV SOLN
INTRAVENOUS | Status: DC | PRN
  Administered 2016-02-03: 10 mL via INTRA_ARTERIAL

## 2016-02-03 MED ORDER — HEPARIN SODIUM (PORCINE) 1000 UNIT/ML IJ SOLN
INTRAMUSCULAR | Status: DC | PRN
  Administered 2016-02-03: 3500 [IU] via INTRAVENOUS

## 2016-02-03 MED ORDER — SODIUM CHLORIDE 0.9% FLUSH: 3.0000 mL | INTRAVENOUS | Status: DC | PRN

## 2016-02-03 MED ORDER — FENTANYL CITRATE (PF) 100 MCG/2ML IJ SOLN
INTRAMUSCULAR | Status: DC | PRN
  Administered 2016-02-03 (×2): 25 ug via INTRAVENOUS

## 2016-02-03 MED ORDER — POLYETHYLENE GLYCOL 3350 17 G PO PACK
17.0000 g | PACK | Freq: Every day | ORAL | Status: DC
  Administered 2016-02-04: 10:00:00 17 g via ORAL
  Filled 2016-02-03: qty 1

## 2016-02-03 SURGICAL SUPPLY — 15 items
CATH BALLN WEDGE 5F 110CM (CATHETERS) ×1 IMPLANT
CATH INFINITI 5 FR JL3.5 (CATHETERS) ×1 IMPLANT
CATH INFINITI JR4 5F (CATHETERS) ×1 IMPLANT
DEVICE RAD COMP TR BAND LRG (VASCULAR PRODUCTS) ×1 IMPLANT
GLIDESHEATH SLEND SS 6F .021 (SHEATH) ×1 IMPLANT
GUIDEWIRE .025 260CM (WIRE) ×1 IMPLANT
GUIDEWIRE INQWIRE 1.5J.035X260 (WIRE) IMPLANT
INQWIRE 1.5J .035X260CM (WIRE) ×2
KIT HEART LEFT (KITS) ×2 IMPLANT
KIT HEART RIGHT NAMIC (KITS) ×2 IMPLANT
PACK CARDIAC CATHETERIZATION (CUSTOM PROCEDURE TRAY) ×2 IMPLANT
SHEATH FAST CATH BRACH 5F 5CM (SHEATH) ×1 IMPLANT
TRANSDUCER W/STOPCOCK (MISCELLANEOUS) ×3 IMPLANT
TUBING CIL FLEX 10 FLL-RA (TUBING) ×2 IMPLANT
WIRE HI TORQ VERSACORE-J 145CM (WIRE) ×1 IMPLANT

## 2016-02-03 NOTE — Progress Notes (Signed)
Pt refused to watch video but educated on how to access video when she feels comfortable or if family wants to watch. Arthor Captain LPN

## 2016-02-03 NOTE — Interval H&P Note (Signed)
History and Physical Interval Note:  02/03/2016 4:07 PM  Kristen Bradley  has presented today for cardiac cath with the diagnosis of cardiomyopathy, elevated troponin.  The various methods of treatment have been discussed with the patient and family. After consideration of risks, benefits and other options for treatment, the patient has consented to  Procedure(s): Right/Left Heart Cath and Coronary Angiography (N/A) as a surgical intervention .  The patient's history has been reviewed, patient examined, no change in status, stable for surgery.  I have reviewed the patient's chart and labs.  Questions were answered to the patient's satisfaction.    Cath Lab Visit (complete for each Cath Lab visit)  Clinical Evaluation Leading to the Procedure:   ACS: Yes.    Non-ACS:    Anginal Classification: CCS III  Anti-ischemic medical therapy: Maximal Therapy (2 or more classes of medications)  Non-Invasive Test Results: No non-invasive testing performed  Prior CABG: No previous CABG         Kristen Bradley

## 2016-02-03 NOTE — H&P (View-Only) (Signed)
Patient Name: Kristen Bradley Date of Encounter: 02/03/2016  Primary Cardiologist: Dr [facility] Problem List     Principal Problem:   Acute respiratory failure [facility]) Active Problems:   Peripheral vascular disease ([place])   Carotid stenosis, bilateral-s/p Bilateral CEA   Atherosclerosis of native artery of extremity with intermittent claudication ([place])   Community acquired pneumonia   Hypertension   Acute kidney injury ([place])   Diabetes mellitus with complication ([place])   LBBB- new   Elevated troponin I level   Thrombocytopenia- plts 81K   Elevated brain natriuretic peptide (BNP) level   Renal failure syndrome   Sinus pause   NSTEMI (non-ST elevated myocardial infarction) ([place])   Cardiomyopathy, ischemic   Acute renal failure superimposed on stage 3 chronic kidney disease (HCC)   Sinus bradycardia     Subjective    no new complaints, no chest pain, no significant shortness of breath  Inpatient Medications    Scheduled Meds: . amLODipine  10 mg Oral QHS  . aspirin EC  81 mg Oral Daily  . cloNIDine  0.1 mg Oral BID  . heparin subcutaneous  5,000 Units Subcutaneous Q8H  . insulin aspart  0-9 Units Subcutaneous TID WC  . metoprolol succinate  25 mg Oral Daily  . simvastatin  40 mg Oral QHS   Continuous Infusions: . sodium chloride 50 mL/hr (02/02/16 2035)   PRN Meds: acetaminophen **OR** acetaminophen, albuterol, hydrALAZINE, nitroGLYCERIN, ondansetron **OR** ondansetron (ZOFRAN) IV, polyethylene glycol, traMADol   Vital Signs    Vitals:   02/02/16 0544 02/02/16 2143 02/03/16 0540 02/03/16 0946  BP: (!) 130/54 (!) 146/57 139/63 (!) 151/68  Pulse: (!) 55 (!) 59 (!) 55   Resp: 18 18 18    Temp: 98.1 F (36.7 C) 98.1 F (36.7 C) 97.8 F (36.6 C)   TempSrc: Oral Oral Oral   SpO2: 100% 99% 100%   Weight: 128 lb 1.4 oz (58.1 kg)     Height:        Intake/Output Summary (Last 24 hours) at 02/03/16 1044 Last data filed at 02/03/16 0700  Gross per 24 hour    Intake           520.83 ml  Output             1000 ml  Net          -479.17 ml   Filed Weights   01/31/16 0547 02/01/16 0537 02/02/16 0544  Weight: 123 lb 1.6 oz (55.8 kg) 131 lb 13.4 oz (59.8 kg) 128 lb 1.4 oz (58.1 kg)    Physical Exam    GEN: Well nourished, well developed, in no acute distress.  HEENT: Grossly normal.  Neck: Supple, no JVD, carotid bruits, or masses. Cardiac: RRR, 2/6 murmur, no rubs, or gallops. No clubbing, cyanosis, edema.  Radials/DP/PT 2+ and equal bilaterally.  Respiratory:  Respirations regular and unlabored, clear to auscultation bilaterally. GI: Soft, nontender, nondistended, BS + x 4. MS: no deformity or atrophy. Skin: warm and dry, no rash. Neuro:  Strength and sensation are intact. Psych: AAOx3.  Normal affect.  Labs    CBC  Recent Labs  02/01/16 0448 02/02/16 0311  WBC  --  3.4*  HGB  --  10.8*  HCT  --  32.9*  MCV  --  90.1  PLT 68* 77*   Basic Metabolic Panel  Recent Labs  02/01/16 0753 02/02/16 0311  NA 138 138  K 3.6 3.7  CL 104 103  CO2  25 25  GLUCOSE 161* 114*  BUN 27* 32*  CREATININE 1.82* 1.83*  CALCIUM 10.2 10.0   Liver Function Tests No results for input(s): AST, ALT, ALKPHOS, BILITOT, PROT, ALBUMIN in the last 72 hours. No results for input(s): LIPASE, AMYLASE in the last 72 hours. Cardiac Enzymes  Recent Labs  02/01/16 2121 02/02/16 0311 02/02/16 1021  TROPONINI 0.16* 0.14* 0.12*   BNP Invalid input(s): POCBNP D-Dimer No results for input(s): DDIMER in the last 72 hours. Hemoglobin A1C No results for input(s): HGBA1C in the last 72 hours. Fasting Lipid Panel No results for input(s): CHOL, HDL, LDLCALC, TRIG, CHOLHDL, LDLDIRECT in the last 72 hours. Thyroid Function Tests No results for input(s): TSH, T4TOTAL, T3FREE, THYROIDAB in the last 72 hours.  Invalid input(s): FREET3  Telemetry    2.2 second pause noted yesterday evening on telemetry, sinus rhythm/sinus bradycardia, underlying left  bundle branch block - Personally Reviewed  ECG    Normal sinus rhythm, left bundle branch block - Personally Reviewed  Radiology    No results found.  Cardiac Studies   ECHO 01/30/16: - Normal LV size with mild LV hypertrophy. EF 25% with severe   diffuse hypokinesis and septal-lateral dyssynchrony. Restrictive   diastolic function. Normal RV size with mildly decreased systolic   function. Moderate pulmonary hypertension. Mild to moderate MR.   Dilated IVC suggestive of elevated RV filling pressure.  Patient Profile     71 year old with newly discovered acute systolic heart failure/dilated cardiomyopathy EF 25% with history of peripheral vascular disease status post right renal artery stenting 2008, right and left carotid endarterectomy in 2013, 2014, lower extremity claudication with ABIs of 0.5 and 0.45 right and left followed by Dr. [name] with prior normal echocardiogram in 2013 admitted with acute shortness of breath, mildly elevated troponin of 0.48, new left bundle branch block.  Assessment & Plan    Acute systolic heart failure/dilated cardiomyopathy  - Mildly elevated troponin consistent with demand ischemia in the setting  - Optimizing medications, given her 2.2 second pause, I will decrease her Toprol to 25 mg a day.  - Avoiding ACE inhibitor/ARB given creatinine of 1.7-1.8 which now appears to be new baseline.  -  She appears fairly euvolemic at this point, comfortable, no orthopnea. I stopped her Lasix in anticipation of cardiac catheterization on Tuesday to discover whether or not she has coronary artery disease contributing to her markedly reduced ejection fraction. Given her severe peripheral vascular disease this may certainly be an issue.   - I've also stopped her Plavix 75 mg on 01/30/16 just in case her cardiac catheterization reveals triple-vessel disease which would require bypass surgery.  - I stopped IV heparin use after 48 hrs of use. Obviously if she has any  anginal symptoms we can always restart prior to cardiac catheterization.  Sinus pause 2.2 seconds 01/30/16 and 12/30  - Decreased metoprolol to 25 mg.   Chronic kidney disease (3)  - Creatinine was 0.74 in 2013, 1.25 in 2014 and 1.66 on admission 01/29/16.  - Stopped Lasix 12/30. No ARB  - Trying to optimize prior to cardiac catheterization.  - gentle IV 50cc hydration   Thrombocytopenia  - chronic 80's  - Appreciate Dr. Jana Hakim consult  - He said ok to use antiplts if needed. Should be OK for cath.    Signed, Candee Furbish, MD  02/03/2016, 10:44 AM

## 2016-02-03 NOTE — Progress Notes (Signed)
PROGRESS NOTE                                                                                                                                                                                                             Patient Demographics:    Kristen Bradley, is a 71 y.o. female, DOB - 02-18-1945, CZY:[id number]  Admit date - 01/29/2016   Admitting Physician Waldemar Dickens, MD  Outpatient Primary MD for the patient is Cammy Copa, MD  LOS - 5  Outpatient Specialists:None  Chief Complaint  Patient presents with  . Shortness of Breath       Brief Narrative  71 year old female with history of hypertension, hyperlipidemia, type 2 diabetes mellitus, hep C, PVD, carotid and the artery stenosis, CAD with prior PCI, tobacco use presented with acute onset of shortness of breath since the night prior to admission. This is progressive her sats were in the 80s when she arrived to the ED. She was placed initially on BiPAP and then transitioned to nasal cannula. She was given nebs as well as IV Solu Medrol and IV Lasix. Patient denied cough, fever, chills, headache, chest pain, palpitation, nausea, vomiting, abdominal pain, bowel or urinary symptoms. Blood will done in the ED showed WBC of 12.4 K elevated troponin of 0.07 which was subsequently further elevated up to 0.48, BNP of 1200 and potassium of 3.2. Patient placed on IV heparin for possible NSTEMI and acute CHF.    Subjective:   Denies any symptoms   Assessment  & Plan :    Principal Problem: Acute systolic CHF (HCC) Suspect ischemic cardiomyopathy. 2-D echo with EF of 25% and diffuse severe hypokinesis. Noted for LVH. Pulmonary artery pressure of 59 mmHg.  Not on ACEi/ ARB due to CK D. - Lasix discontinued due to underlying CK D and patient euvolemic. Toprol dose reduced due to sinus bradycardia.     Active Problems: NSTEMI Suspecting demand ischemia with  cardiomyopathy. Off heparin drip. cardiac catheterization today. Holding Plavix. Continue low-dose beta blocker.   ? Lobar pneumonia Seen on CXR. Shortness of breath is possibly due to CHF. Would discontinue antibiotics (completes 5 days today)  Thrombocytopenia As per PCP platelets dating back to almost a year have been in the range of 80-105. No prior evaluation or workup done. Peripheral smear reviewed by hematologist, negative for schistocytes.  Suspects moderate ITP. Recommend he to use antiplatelets with current level of platelets,  No intervention needed as long as platelet count >50 K . Cardiology okay with current level of platelets for cardiac cath.. -Pending labs today.  Sinus bradycardia with short pauses Improved after Toprol dose reduced.   Peripheral vascular disease (HCC) On plavix and Pletal. Holding Plavix for cardiac cath.     Diabetes mellitus type 2 in nonobese (HCC) Metformin held. Stable on sliding cell coverage. A1c of 6.9.  Acute on chronic kidney disease stage III (HCC) Held HCTZ and ARB as well as metformin. Discussed with PCP. Her creatinine was around 1.4 about 6 months back and has slowly worsened (last labs checked early this month and was 1.7) avoid nephrotoxic agents.   Hypokalemia Replenished     Code Status : full code  Family Communication  : none at bedside  Disposition Plan  : Cardiac cath plan for 1/2.  Barriers For Discharge : Active symptoms  Consults  :  Cardiology Hematology (Dr. Jana Hakim)  Procedures  :  2d echo  DVT Prophylaxis  : Sq heaprin  Lab Results  Component Value Date   PLT 77 (L) 02/02/2016    Antibiotics  :   Anti-infectives    Start     Dose/Rate Route Frequency Ordered Stop   01/30/16 0430  azithromycin (ZITHROMAX) 500 mg in dextrose 5 % 250 mL IVPB  Status:  Discontinued     500 mg 250 mL/hr over 60 Minutes Intravenous Every 24 hours 01/29/16 1119 02/02/16 1048   01/29/16 0830  cefTRIAXone (ROCEPHIN)  1 g in dextrose 5 % 50 mL IVPB  Status:  Discontinued     1 g 100 mL/hr over 30 Minutes Intravenous Every 24 hours 01/29/16 0823 01/29/16 1119   01/29/16 0830  azithromycin (ZITHROMAX) 500 mg in dextrose 5 % 250 mL IVPB  Status:  Discontinued     500 mg 250 mL/hr over 60 Minutes Intravenous Every 24 hours 01/29/16 0823 01/29/16 1119   01/29/16 0430  cefTRIAXone (ROCEPHIN) 1 g in dextrose 5 % 50 mL IVPB  Status:  Discontinued     1 g 100 mL/hr over 30 Minutes Intravenous Every 24 hours 01/29/16 1119 02/02/16 1048   01/29/16 0415  cefTRIAXone (ROCEPHIN) 1 g in dextrose 5 % 50 mL IVPB     1 g 100 mL/hr over 30 Minutes Intravenous  Once 01/29/16 0413 01/29/16 0500   01/29/16 0415  azithromycin (ZITHROMAX) 500 mg in dextrose 5 % 250 mL IVPB     500 mg 250 mL/hr over 60 Minutes Intravenous  Once 01/29/16 0413 01/29/16 0530        Objective:   Vitals:   02/02/16 0544 02/02/16 2143 02/03/16 0540 02/03/16 0946  BP: (!) 130/54 (!) 146/57 139/63 (!) 151/68  Pulse: (!) 55 (!) 59 (!) 55   Resp: 18 18 18    Temp: 98.1 F (36.7 C) 98.1 F (36.7 C) 97.8 F (36.6 C)   TempSrc: Oral Oral Oral   SpO2: 100% 99% 100%   Weight: 58.1 kg (128 lb 1.4 oz)     Height:        Wt Readings from Last 3 Encounters:  02/02/16 58.1 kg (128 lb 1.4 oz)  09/19/15 56.7 kg (125 lb)  01/10/15 57 kg (125 lb 11.2 oz)     Intake/Output Summary (Last 24 hours) at 02/03/16 1130 Last data filed at 02/03/16 0700  Gross per 24 hour  Intake  520.83 ml  Output             1000 ml  Net          -479.17 ml     Physical Exam  Gen: not in distress HEENT:  moist mucosa, supple neck Chest: clear b/l, no added sounds CVS: N S1&S2, no murmurs, rubs or gallop GI: soft, NT, ND,  Musculoskeletal: warm, no edema     Data Review:    CBC  Recent Labs Lab 01/29/16 0229 01/29/16 0823 01/30/16 0330 01/31/16 0615 02/01/16 0448 02/02/16 0311  WBC 12.4* 6.1 6.9 3.7*  --  3.4*  HGB 14.5 12.1 11.0* 11.7*   --  10.8*  HCT 45.6 37.2 33.2* 36.9  --  32.9*  PLT 148* 77* 81* 85* 68* 77*  MCV 92.7 88.8 87.6 90.9  --  90.1  MCH 29.5 28.9 29.0 28.8  --  29.6  MCHC 31.8 32.5 33.1 31.7  --  32.8  RDW 15.1 14.5 14.4 14.8  --  14.4    Chemistries   Recent Labs Lab 01/29/16 0229 01/29/16 0823 01/30/16 0330 01/31/16 0615 02/01/16 0753 02/02/16 0311  NA 139  --  137 140 138 138  K 3.2*  --  3.3* 3.6 3.6 3.7  CL 103  --  101 104 104 103  CO2 26  --  26 25 25 25   GLUCOSE 182*  --  124* 145* 161* 114*  BUN 18  --  24* 29* 27* 32*  CREATININE 1.66* 1.87* 1.78* 1.77* 1.82* 1.83*  CALCIUM 11.1*  --  10.2 10.6* 10.2 10.0  AST 30  --  20  --   --   --   ALT 19  --  14  --   --   --   ALKPHOS 107  --  69  --   --   --   BILITOT 0.6  --  0.8  --   --   --    ------------------------------------------------------------------------------------------------------------------ No results for input(s): CHOL, HDL, LDLCALC, TRIG, CHOLHDL, LDLDIRECT in the last 72 hours.  Lab Results  Component Value Date   HGBA1C 6.9 (H) 02/09/2012   ------------------------------------------------------------------------------------------------------------------ No results for input(s): TSH, T4TOTAL, T3FREE, THYROIDAB in the last 72 hours.  Invalid input(s): FREET3 ------------------------------------------------------------------------------------------------------------------ No results for input(s): VITAMINB12, FOLATE, FERRITIN, TIBC, IRON, RETICCTPCT in the last 72 hours.  Coagulation profile  Recent Labs Lab 02/02/16 2050  INR 1.00    No results for input(s): DDIMER in the last 72 hours.  Cardiac Enzymes  Recent Labs Lab 02/01/16 2121 02/02/16 0311 02/02/16 1021  TROPONINI 0.16* 0.14* 0.12*   ------------------------------------------------------------------------------------------------------------------    Component Value Date/Time   BNP 1,091.5 (H) 01/29/2016 0347    Inpatient  Medications  Scheduled Meds: . amLODipine  10 mg Oral QHS  . aspirin EC  81 mg Oral Daily  . cloNIDine  0.1 mg Oral BID  . heparin subcutaneous  5,000 Units Subcutaneous Q8H  . insulin aspart  0-9 Units Subcutaneous TID WC  . metoprolol succinate  25 mg Oral Daily  . simvastatin  40 mg Oral QHS   Continuous Infusions: . sodium chloride 50 mL/hr (02/02/16 2035)   PRN Meds:.acetaminophen **OR** acetaminophen, albuterol, hydrALAZINE, nitroGLYCERIN, ondansetron **OR** ondansetron (ZOFRAN) IV, polyethylene glycol, traMADol  Micro Results Recent Results (from the past 240 hour(s))  Culture, sputum-assessment     Status: None   Collection Time: 01/29/16  8:23 AM  Result Value Ref Range Status   Specimen Description EXPECTORATED  SPUTUM  Final   Special Requests NONE  Final   Sputum evaluation   Final    Sputum specimen not acceptable for testing.  Please recollect.   Results Called to: G. GLEASON, RN AT [place] ON 01/29/16 BY C. JESSUP, MLT.    Report Status 01/29/2016 FINAL  Final  Culture, blood (routine x 2) Call MD if unable to obtain prior to antibiotics being given     Status: None (Preliminary result)   Collection Time: 01/29/16  9:07 AM  Result Value Ref Range Status   Specimen Description BLOOD LEFT HAND  Final   Special Requests BOTTLES DRAWN AEROBIC AND ANAEROBIC  5CC  Final   Culture NO GROWTH 4 DAYS  Final   Report Status PENDING  Incomplete  Culture, blood (routine x 2) Call MD if unable to obtain prior to antibiotics being given     Status: None (Preliminary result)   Collection Time: 01/29/16 11:52 AM  Result Value Ref Range Status   Specimen Description BLOOD RIGHT HAND  Final   Special Requests BOTTLES DRAWN AEROBIC ONLY  5CC  Final   Culture NO GROWTH 4 DAYS  Final   Report Status PENDING  Incomplete    Radiology Reports Dg Chest Portable 1 View  Result Date: 01/29/2016 CLINICAL DATA:  Shortness of breath EXAM: PORTABLE CHEST 1 VIEW COMPARISON:  Chest CT  08/16/2013 FINDINGS: There is atherosclerotic calcification in the aortic arch. Cardiomediastinal contours are otherwise normal. There is increased airspace opacity in the right lower lobe. No pleural effusion or pneumothorax. IMPRESSION: Increased opacity in the right lower lobe is concerning for pneumonia. Electronically Signed   By: Ulyses Jarred M.D.   On: 01/29/2016 02:50    Time Spent in minutes  25   Louellen Molder M.D on 02/03/2016 at 11:30 AM  Between 7am to 7pm - Pager - [phone]  After 7pm go to [email or web address] - password [facility]  [facility] -  Office  [phone]

## 2016-02-03 NOTE — Progress Notes (Addendum)
Patient Name: Kristen Bradley Date of Encounter: 02/03/2016  Primary Cardiologist: Dr [facility] Problem List     Principal Problem:   Acute respiratory failure [facility]) Active Problems:   Peripheral vascular disease ([place])   Carotid stenosis, bilateral-s/p Bilateral CEA   Atherosclerosis of native artery of extremity with intermittent claudication ([place])   Community acquired pneumonia   Hypertension   Acute kidney injury ([place])   Diabetes mellitus with complication ([place])   LBBB- new   Elevated troponin I level   Thrombocytopenia- plts 81K   Elevated brain natriuretic peptide (BNP) level   Renal failure syndrome   Sinus pause   NSTEMI (non-ST elevated myocardial infarction) ([place])   Cardiomyopathy, ischemic   Acute renal failure superimposed on stage 3 chronic kidney disease (HCC)   Sinus bradycardia     Subjective    no new complaints, no chest pain, no significant shortness of breath  Inpatient Medications    Scheduled Meds: . amLODipine  10 mg Oral QHS  . aspirin EC  81 mg Oral Daily  . cloNIDine  0.1 mg Oral BID  . heparin subcutaneous  5,000 Units Subcutaneous Q8H  . insulin aspart  0-9 Units Subcutaneous TID WC  . metoprolol succinate  25 mg Oral Daily  . simvastatin  40 mg Oral QHS   Continuous Infusions: . sodium chloride 50 mL/hr (02/02/16 2035)   PRN Meds: acetaminophen **OR** acetaminophen, albuterol, hydrALAZINE, nitroGLYCERIN, ondansetron **OR** ondansetron (ZOFRAN) IV, polyethylene glycol, traMADol   Vital Signs    Vitals:   02/02/16 0544 02/02/16 2143 02/03/16 0540 02/03/16 0946  BP: (!) 130/54 (!) 146/57 139/63 (!) 151/68  Pulse: (!) 55 (!) 59 (!) 55   Resp: 18 18 18    Temp: 98.1 F (36.7 C) 98.1 F (36.7 C) 97.8 F (36.6 C)   TempSrc: Oral Oral Oral   SpO2: 100% 99% 100%   Weight: 128 lb 1.4 oz (58.1 kg)     Height:        Intake/Output Summary (Last 24 hours) at 02/03/16 1044 Last data filed at 02/03/16 0700  Gross per 24 hour    Intake           520.83 ml  Output             1000 ml  Net          -479.17 ml   Filed Weights   01/31/16 0547 02/01/16 0537 02/02/16 0544  Weight: 123 lb 1.6 oz (55.8 kg) 131 lb 13.4 oz (59.8 kg) 128 lb 1.4 oz (58.1 kg)    Physical Exam    GEN: Well nourished, well developed, in no acute distress.  HEENT: Grossly normal.  Neck: Supple, no JVD, carotid bruits, or masses. Cardiac: RRR, 2/6 murmur, no rubs, or gallops. No clubbing, cyanosis, edema.  Radials/DP/PT 2+ and equal bilaterally.  Respiratory:  Respirations regular and unlabored, clear to auscultation bilaterally. GI: Soft, nontender, nondistended, BS + x 4. MS: no deformity or atrophy. Skin: warm and dry, no rash. Neuro:  Strength and sensation are intact. Psych: AAOx3.  Normal affect.  Labs    CBC  Recent Labs  02/01/16 0448 02/02/16 0311  WBC  --  3.4*  HGB  --  10.8*  HCT  --  32.9*  MCV  --  90.1  PLT 68* 77*   Basic Metabolic Panel  Recent Labs  02/01/16 0753 02/02/16 0311  NA 138 138  K 3.6 3.7  CL 104 103  CO2  25 25  GLUCOSE 161* 114*  BUN 27* 32*  CREATININE 1.82* 1.83*  CALCIUM 10.2 10.0   Liver Function Tests No results for input(s): AST, ALT, ALKPHOS, BILITOT, PROT, ALBUMIN in the last 72 hours. No results for input(s): LIPASE, AMYLASE in the last 72 hours. Cardiac Enzymes  Recent Labs  02/01/16 2121 02/02/16 0311 02/02/16 1021  TROPONINI 0.16* 0.14* 0.12*   BNP Invalid input(s): POCBNP D-Dimer No results for input(s): DDIMER in the last 72 hours. Hemoglobin A1C No results for input(s): HGBA1C in the last 72 hours. Fasting Lipid Panel No results for input(s): CHOL, HDL, LDLCALC, TRIG, CHOLHDL, LDLDIRECT in the last 72 hours. Thyroid Function Tests No results for input(s): TSH, T4TOTAL, T3FREE, THYROIDAB in the last 72 hours.  Invalid input(s): FREET3  Telemetry    2.2 second pause noted yesterday evening on telemetry, sinus rhythm/sinus bradycardia, underlying left  bundle branch block - Personally Reviewed  ECG    Normal sinus rhythm, left bundle branch block - Personally Reviewed  Radiology    No results found.  Cardiac Studies   ECHO 01/30/16: - Normal LV size with mild LV hypertrophy. EF 25% with severe   diffuse hypokinesis and septal-lateral dyssynchrony. Restrictive   diastolic function. Normal RV size with mildly decreased systolic   function. Moderate pulmonary hypertension. Mild to moderate MR.   Dilated IVC suggestive of elevated RV filling pressure.  Patient Profile     71 year old with newly discovered acute systolic heart failure/dilated cardiomyopathy EF 25% with history of peripheral vascular disease status post right renal artery stenting 2008, right and left carotid endarterectomy in 2013, 2014, lower extremity claudication with ABIs of 0.5 and 0.45 right and left followed by Dr. [name] with prior normal echocardiogram in 2013 admitted with acute shortness of breath, mildly elevated troponin of 0.48, new left bundle branch block.  Assessment & Plan    Acute systolic heart failure/dilated cardiomyopathy  - Mildly elevated troponin consistent with demand ischemia in the setting  - Optimizing medications, given her 2.2 second pause, I will decrease her Toprol to 25 mg a day.  - Avoiding ACE inhibitor/ARB given creatinine of 1.7-1.8 which now appears to be new baseline.  -  She appears fairly euvolemic at this point, comfortable, no orthopnea. I stopped her Lasix in anticipation of cardiac catheterization on Tuesday to discover whether or not she has coronary artery disease contributing to her markedly reduced ejection fraction. Given her severe peripheral vascular disease this may certainly be an issue.   - I've also stopped her Plavix 75 mg on 01/30/16 just in case her cardiac catheterization reveals triple-vessel disease which would require bypass surgery.  - I stopped IV heparin use after 48 hrs of use. Obviously if she has any  anginal symptoms we can always restart prior to cardiac catheterization.  Sinus pause 2.2 seconds 01/30/16 and 12/30  - Decreased metoprolol to 25 mg.   Chronic kidney disease (3)  - Creatinine was 0.74 in 2013, 1.25 in 2014 and 1.66 on admission 01/29/16.  - Stopped Lasix 12/30. No ARB  - Trying to optimize prior to cardiac catheterization.  - gentle IV 50cc hydration   Thrombocytopenia  - chronic 80's  - Appreciate Dr. Jana Hakim consult  - He said ok to use antiplts if needed. Should be OK for cath.    Signed, Candee Furbish, MD  02/03/2016, 10:44 AM

## 2016-02-03 NOTE — Care Management Important Message (Signed)
Important Message  Patient Details  Name: Kristen Bradley MRN: [id number] Date of Birth: 04/12/1945   Medicare Important Message Given:  Yes    Nathen May 02/03/2016, 2:47 PM

## 2016-02-03 NOTE — Progress Notes (Signed)
TR BAND REMOVAL  LOCATION:    right radial  DEFLATED PER PROTOCOL:    Yes.    TIME BAND OFF / DRESSING APPLIED:    19:30   SITE UPON ARRIVAL:    Level 0  SITE AFTER BAND REMOVAL:    Level 0  CIRCULATION SENSATION AND MOVEMENT:    Within Normal Limits   Yes.    COMMENTS:   Post TR band instructions given. Pt tolerated well.

## 2016-02-04 ENCOUNTER — Telehealth: Payer: Self-pay | Admitting: Oncology

## 2016-02-04 ENCOUNTER — Encounter (HOSPITAL_COMMUNITY): Payer: Self-pay | Admitting: Cardiovascular Disease

## 2016-02-04 ENCOUNTER — Encounter: Payer: Self-pay | Admitting: Oncology

## 2016-02-04 DIAGNOSIS — I2510 Atherosclerotic heart disease of native coronary artery without angina pectoris: Secondary | ICD-10-CM

## 2016-02-04 LAB — CBC
HEMATOCRIT: 34.9 % — AB (ref 36.0–46.0)
Hemoglobin: 11.2 g/dL — ABNORMAL LOW (ref 12.0–15.0)
MCH: 29.1 pg (ref 26.0–34.0)
MCHC: 32.1 g/dL (ref 30.0–36.0)
MCV: 90.6 fL (ref 78.0–100.0)
Platelets: 87 10*3/uL — ABNORMAL LOW (ref 150–400)
RBC: 3.85 MIL/uL — ABNORMAL LOW (ref 3.87–5.11)
RDW: 14.7 % (ref 11.5–15.5)
WBC: 3.3 10*3/uL — AB (ref 4.0–10.5)

## 2016-02-04 LAB — BASIC METABOLIC PANEL
ANION GAP: 7 (ref 5–15)
BUN: 29 mg/dL — ABNORMAL HIGH (ref 6–20)
CALCIUM: 10 mg/dL (ref 8.9–10.3)
CHLORIDE: 111 mmol/L (ref 101–111)
CO2: 23 mmol/L (ref 22–32)
Creatinine, Ser: 1.73 mg/dL — ABNORMAL HIGH (ref 0.44–1.00)
GFR calc Af Amer: 34 mL/min — ABNORMAL LOW (ref >60)
GFR calc non Af Amer: 29 mL/min — ABNORMAL LOW (ref >60)
GLUCOSE: 112 mg/dL — AB (ref 65–99)
POTASSIUM: 4 mmol/L (ref 3.5–5.1)
Sodium: 141 mmol/L (ref 135–145)

## 2016-02-04 LAB — GLUCOSE, CAPILLARY: Glucose-Capillary: 125 mg/dL — ABNORMAL HIGH (ref 65–99)

## 2016-02-04 MED ORDER — METOPROLOL SUCCINATE ER 25 MG PO TB24: 25.0000 mg | ORAL_TABLET | Freq: Every day | ORAL | 0 refills | Status: DC

## 2016-02-04 MED ORDER — ISOSORBIDE MONONITRATE ER 30 MG PO TB24: 30.0000 mg | ORAL_TABLET | Freq: Every day | ORAL | 0 refills | Status: DC

## 2016-02-04 MED ORDER — FUROSEMIDE 20 MG PO TABS: 20.0000 mg | ORAL_TABLET | Freq: Every day | ORAL | 0 refills | Status: DC | PRN

## 2016-02-04 MED ORDER — NITROGLYCERIN 0.4 MG SL SUBL: 0.4000 mg | SUBLINGUAL_TABLET | SUBLINGUAL | 0 refills | Status: DC | PRN

## 2016-02-04 MED ORDER — POLYETHYLENE GLYCOL 3350 17 G PO PACK: 17.0000 g | PACK | Freq: Every day | ORAL | 0 refills | Status: DC | PRN

## 2016-02-04 MED ORDER — ISOSORBIDE MONONITRATE ER 30 MG PO TB24
30.0000 mg | ORAL_TABLET | Freq: Every day | ORAL | Status: DC
  Administered 2016-02-04: 13:00:00 30 mg via ORAL
  Filled 2016-02-04: qty 1

## 2016-02-04 MED ORDER — CLOPIDOGREL BISULFATE 75 MG PO TABS
75.0000 mg | ORAL_TABLET | Freq: Every day | ORAL | Status: DC
  Administered 2016-02-04: 13:00:00 75 mg via ORAL
  Filled 2016-02-04: qty 1

## 2016-02-04 NOTE — Consult Note (Signed)
[name]      Cardiothoracic Surgery Consultation  Reason for Consult: Severe multivessel coronary artery disease Referring Physician: Dr. [name] is an 71 y.o. female.  HPI:   The patient is a 71 year old woman with hypertension, hyperlipidemia, DM, long time heavy smoking ( quit last week she says), prior stroke and s/p bilateral CEA, renal artery stenosis who was admitted with acute onset of progressive shortness of breath with hypoxemia. She was treated with bronchodilators, steroids and diuretic as well as BiPAP for possible CAP. Her troponin was mildly elevated at 0.07 and BNP > 1000. Her echo showed an LVEF of 25% with diffuse severe hypokinesis. Her EF was 60% by echo in 12/2011. Her cath yesterday showed severe diffuse calcific three vessel CAD with normal filling pressures. There was calcification of the proximal ascending aorta on cath. Her disease was not felt to be favorable for PCI.  Past Medical History:  Diagnosis Date  . Adrenal tumor 08/2007   surgery  . Cancer of right breast [facility])    s/p lumpectomy, XRT  . Carotid artery disease (HCC)    s/p bilateral CEA  . Hepatitis C    "got treatment for it" (01/29/2016)  . High cholesterol    takes Lopid daily  . History of UTI    takes Diflucan dail--pt states no uti in a couple of yrs though  . Hypertension    takes Losartan,Amlodipine,HCTZ,and Atenolol daily and Clatarpres  . Joint pain    legs  . Renal artery stenosis (HCC)    Right renal artery stent 05/31/06  . Stroke [facility]) Jan. 7, 2014  . Tobacco abuse   . Type II diabetes mellitus ([place])     Past Surgical History:  Procedure Laterality Date  . ABDOMINAL HYSTERECTOMY    . ADRENAL GLAND SURGERY  2009  . BREAST LUMPECTOMY Right 2009  . CARDIAC CATHETERIZATION N/A 02/03/2016   Procedure: Right/Left Heart Cath and Coronary Angiography;  Surgeon: Burnell Blanks,  MD;  Location: [facility];  Service: Cardiovascular;  Laterality: N/A;  . ENDARTERECTOMY  01/05/2012   Procedure: ENDARTERECTOMY CAROTID;  Surgeon: Conrad Sonterra, MD;  Location: [facility];  Service: Vascular;  Laterality: Right;  Right Carotid Artery Endarterectomy, right stump pressure, intraoperative ultrasound  . ENDARTERECTOMY  02/08/2012   Procedure: ENDARTERECTOMY CAROTID;  Surgeon: Conrad Cassia, MD;  Location: [facility];  Service: Vascular;  Laterality: Left;  . LUMBAR DISC SURGERY    . PATCH ANGIOPLASTY  02/08/2012   Procedure: PATCH ANGIOPLASTY;  Surgeon: Conrad Newman, MD;  Location: [facility];  Service: Vascular;  Laterality: Left;  Carotid artery patch angioplasty using Vascu-Guard bovine patch 1cm x 6cm.  . RENAL ARTERY STENT Right 05/2006    Family History  Problem Relation Age of Onset  . Cancer Mother     ? type  . Cancer Father     ? type  . Diabetes Sister   . Hypertension Sister   . Diabetes Brother   . Hypertension Brother   . CAD Neg Hx     Social History:  reports that she has been smoking Cigarettes.  She has a 26.50 pack-year smoking history. She has never used smokeless tobacco. She reports that she does not drink alcohol or use drugs.  Allergies:  Allergies  Allergen Reactions  . Chlorhexidine Gluconate Itching  . Ace Inhibitors Cough  . Dilaudid [Hydromorphone Hcl] Itching    Medications:  I have reviewed the patient's current medications. Prior to Admission:  Prescriptions Prior to Admission  Medication Sig Dispense Refill Last Dose  . amLODipine (NORVASC) 10 MG tablet Take 10 mg by mouth at bedtime.     01/28/2016 at Unknown time  . aspirin EC 81 MG tablet Take 81 mg by mouth daily.     01/28/2016 at Unknown time  . atenolol (TENORMIN) 100 MG tablet Take 100 mg by mouth every morning.     01/28/2016 at 0700  . cloNIDine (CATAPRES) 0.1 MG tablet Take 0.1 mg by mouth 2 (two) times daily.     01/28/2016 at Unknown time  . clopidogrel (PLAVIX) 75 MG tablet  Take 1 tablet (75 mg total) by mouth daily. 30 tablet 11 01/28/2016 at 0700  . hydrochlorothiazide (HYDRODIURIL) 12.5 MG tablet Take 12.5 mg by mouth daily.    01/28/2016 at Unknown time  . losartan (COZAAR) 100 MG tablet Take 100 mg by mouth daily.    01/28/2016 at Unknown time  . metFORMIN (GLUCOPHAGE) 500 MG tablet Take 500 mg by mouth 2 (two) times daily with a meal.     01/28/2016 at Unknown time  . PRODIGY NO CODING BLOOD GLUC test strip 3 (three) times daily.    01/28/2016 at Unknown time  . PRODIGY TWIST TOP LANCETS 28G MISC 3 (three) times daily.    01/28/2016 at Unknown time  . simvastatin (ZOCOR) 40 MG tablet Take 40 mg by mouth daily.    01/28/2016 at Unknown time  . traMADol (ULTRAM) 50 MG tablet Take 1 tablet (50 mg total) by mouth every 6 (six) hours as needed. (Patient taking differently: Take 50 mg by mouth every 6 (six) hours as needed for moderate pain. ) 30 tablet 0 Past Week at Unknown time  . cilostazol (PLETAL) 100 MG tablet Take 1 tablet (100 mg total) by mouth 2 (two) times daily before a meal. (Patient not taking: Reported on 01/29/2016) 60 tablet 11 Not Taking at Unknown time  . cilostazol (PLETAL) 100 MG tablet Take 1 tablet (100 mg total) by mouth 2 (two) times daily before a meal. (Patient not taking: Reported on 01/29/2016) 60 tablet 11 Not Taking at Unknown time   Scheduled: . amLODipine  10 mg Oral QHS  . aspirin EC  81 mg Oral Daily  . cloNIDine  0.1 mg Oral BID  . clopidogrel  75 mg Oral Daily  . insulin aspart  0-9 Units Subcutaneous TID WC  . isosorbide mononitrate  30 mg Oral Daily  . metoprolol succinate  25 mg Oral Daily  . polyethylene glycol  17 g Oral Daily  . simvastatin  40 mg Oral QHS  . sodium chloride flush  3 mL Intravenous Q12H   Continuous:  WPT:YYPEJY chloride, acetaminophen **OR** acetaminophen, albuterol, hydrALAZINE, nitroGLYCERIN, ondansetron **OR** ondansetron (ZOFRAN) IV, polyethylene glycol, sodium chloride flush,  traMADol Anti-infectives    Start     Dose/Rate Route Frequency Ordered Stop   01/30/16 0430  azithromycin (ZITHROMAX) 500 mg in dextrose 5 % 250 mL IVPB  Status:  Discontinued     500 mg 250 mL/hr over 60 Minutes Intravenous Every 24 hours 01/29/16 1119 02/02/16 1048   01/29/16 0830  cefTRIAXone (ROCEPHIN) 1 g in dextrose 5 % 50 mL IVPB  Status:  Discontinued     1 g 100 mL/hr over 30 Minutes Intravenous  Every 24 hours 01/29/16 0823 01/29/16 1119   01/29/16 0830  azithromycin (ZITHROMAX) 500 mg in dextrose 5 % 250 mL IVPB  Status:  Discontinued     500 mg 250 mL/hr over 60 Minutes Intravenous Every 24 hours 01/29/16 0823 01/29/16 1119   01/29/16 0430  cefTRIAXone (ROCEPHIN) 1 g in dextrose 5 % 50 mL IVPB  Status:  Discontinued     1 g 100 mL/hr over 30 Minutes Intravenous Every 24 hours 01/29/16 1119 02/02/16 1048   01/29/16 0415  cefTRIAXone (ROCEPHIN) 1 g in dextrose 5 % 50 mL IVPB     1 g 100 mL/hr over 30 Minutes Intravenous  Once 01/29/16 0413 01/29/16 0500   01/29/16 0415  azithromycin (ZITHROMAX) 500 mg in dextrose 5 % 250 mL IVPB     500 mg 250 mL/hr over 60 Minutes Intravenous  Once 01/29/16 0413 01/29/16 0530      Results for orders placed or performed during the hospital encounter of 01/29/16 (from the past 48 hour(s))  Glucose, capillary     Status: Abnormal   Collection Time: 02/02/16  5:35 PM  Result Value Ref Range   Glucose-Capillary 112 (H) 65 - 99 mg/dL  Protime-INR     Status: None   Collection Time: 02/02/16  8:50 PM  Result Value Ref Range   Prothrombin Time 13.2 11.4 - 15.2 seconds   INR 1.00   Glucose, capillary     Status: Abnormal   Collection Time: 02/02/16  9:42 PM  Result Value Ref Range   Glucose-Capillary 125 (H) 65 - 99 mg/dL  Glucose, capillary     Status: Abnormal   Collection Time: 02/03/16  7:39 AM  Result Value Ref Range   Glucose-Capillary 113 (H) 65 - 99 mg/dL  Glucose, capillary     Status: Abnormal   Collection Time: 02/03/16 12:33  PM  Result Value Ref Range   Glucose-Capillary 137 (H) 65 - 99 mg/dL  CBC     Status: Abnormal   Collection Time: 02/03/16  1:04 PM  Result Value Ref Range   WBC 3.1 (L) 4.0 - 10.5 K/uL   RBC 4.00 3.87 - 5.11 MIL/uL   Hemoglobin 11.6 (L) 12.0 - 15.0 g/dL   HCT 36.4 36.0 - 46.0 %   MCV 91.0 78.0 - 100.0 fL   MCH 29.0 26.0 - 34.0 pg   MCHC 31.9 30.0 - 36.0 g/dL   RDW 14.6 11.5 - 15.5 %   Platelets 99 (L) 150 - 400 K/uL    Comment: CONSISTENT WITH PREVIOUS RESULT  Basic metabolic panel     Status: Abnormal   Collection Time: 02/03/16  1:04 PM  Result Value Ref Range   Sodium 141 135 - 145 mmol/L   Potassium 4.8 3.5 - 5.1 mmol/L   Chloride 111 101 - 111 mmol/L   CO2 23 22 - 32 mmol/L   Glucose, Bld 132 (H) 65 - 99 mg/dL   BUN 32 (H) 6 - 20 mg/dL   Creatinine, Ser 1.67 (H) 0.44 - 1.00 mg/dL   Calcium 9.8 8.9 - 10.3 mg/dL   GFR calc non Af Amer 30 (L) >60 mL/min   GFR calc Af Amer 35 (L) >60 mL/min    Comment: (NOTE) The eGFR has been calculated using the CKD EPI equation. This calculation has not been validated in all clinical situations. eGFR's persistently <60 mL/min signify possible Chronic Kidney Disease.    Anion gap 7 5 - 15  I-STAT 3, venous blood gas (  G3P V)     Status: Abnormal   Collection Time: 02/03/16  4:25 PM  Result Value Ref Range   pH, Ven 7.369 7.250 - 7.430   pCO2, Ven 38.0 (L) 44.0 - 60.0 mmHg   pO2, Ven 33.0 32.0 - 45.0 mmHg   Bicarbonate 21.9 20.0 - 28.0 mmol/L   TCO2 23 0 - 100 mmol/L   O2 Saturation 62.0 %   Acid-base deficit 3.0 (H) 0.0 - 2.0 mmol/L   Patient temperature HIDE    Sample type VENOUS    Comment NOTIFIED PHYSICIAN   I-STAT 3, arterial blood gas (G3+)     Status: Abnormal   Collection Time: 02/03/16  4:29 PM  Result Value Ref Range   pH, Arterial 7.388 7.350 - 7.450   pCO2 arterial 33.6 32.0 - 48.0 mmHg   pO2, Arterial 69.0 (L) 83.0 - 108.0 mmHg   Bicarbonate 20.2 20.0 - 28.0 mmol/L   TCO2 21 0 - 100 mmol/L   O2 Saturation 93.0  %   Acid-base deficit 4.0 (H) 0.0 - 2.0 mmol/L   Patient temperature HIDE    Sample type ARTERIAL   Glucose, capillary     Status: Abnormal   Collection Time: 02/03/16  5:07 PM  Result Value Ref Range   Glucose-Capillary 113 (H) 65 - 99 mg/dL  Glucose, capillary     Status: Abnormal   Collection Time: 02/03/16  7:53 PM  Result Value Ref Range   Glucose-Capillary 109 (H) 65 - 99 mg/dL   Comment 1 Notify RN    Comment 2 Document in Chart   CBC     Status: Abnormal   Collection Time: 02/04/16  2:29 AM  Result Value Ref Range   WBC 3.3 (L) 4.0 - 10.5 K/uL   RBC 3.85 (L) 3.87 - 5.11 MIL/uL   Hemoglobin 11.2 (L) 12.0 - 15.0 g/dL   HCT 34.9 (L) 36.0 - 46.0 %   MCV 90.6 78.0 - 100.0 fL   MCH 29.1 26.0 - 34.0 pg   MCHC 32.1 30.0 - 36.0 g/dL   RDW 14.7 11.5 - 15.5 %   Platelets 87 (L) 150 - 400 K/uL    Comment: CONSISTENT WITH PREVIOUS RESULT  Basic metabolic panel     Status: Abnormal   Collection Time: 02/04/16  2:29 AM  Result Value Ref Range   Sodium 141 135 - 145 mmol/L   Potassium 4.0 3.5 - 5.1 mmol/L   Chloride 111 101 - 111 mmol/L   CO2 23 22 - 32 mmol/L   Glucose, Bld 112 (H) 65 - 99 mg/dL   BUN 29 (H) 6 - 20 mg/dL   Creatinine, Ser 1.73 (H) 0.44 - 1.00 mg/dL   Calcium 10.0 8.9 - 10.3 mg/dL   GFR calc non Af Amer 29 (L) >60 mL/min   GFR calc Af Amer 34 (L) >60 mL/min    Comment: (NOTE) The eGFR has been calculated using the CKD EPI equation. This calculation has not been validated in all clinical situations. eGFR's persistently <60 mL/min signify possible Chronic Kidney Disease.    Anion gap 7 5 - 15  Glucose, capillary     Status: Abnormal   Collection Time: 02/04/16  6:18 AM  Result Value Ref Range   Glucose-Capillary 125 (H) 65 - 99 mg/dL   Comment 1 Notify RN    Comment 2 Document in Chart     No results found.  Review of Systems  Constitutional: Positive for malaise/fatigue.  HENT: Negative.  Eyes: Negative.   Respiratory: Positive for shortness of  breath. Negative for cough and sputum production.   Cardiovascular: Positive for orthopnea. Negative for chest pain, palpitations and leg swelling.  Gastrointestinal: Negative.   Genitourinary: Negative.   Musculoskeletal: Negative.   Skin: Negative.   Neurological: Negative.   Endo/Heme/Allergies: Negative.   Psychiatric/Behavioral: Negative.    Blood pressure (!) 158/33, pulse 64, temperature 98 F (36.7 C), temperature source Oral, resp. rate 19, height 5\' 5"  (1.651 m), weight 59 kg (130 lb 1.1 oz), SpO2 100 %. Physical Exam  Constitutional: She is oriented to person, place, and time. No distress.  Looks older than stated age.   HENT:  Head: Normocephalic and atraumatic.  Eyes: EOM are normal. Pupils are equal, round, and reactive to light.  Neck: Normal range of motion. Neck supple. No JVD present. No thyromegaly present.  Cardiovascular: Normal rate and regular rhythm.   Murmur heard. 1/6 systolic murmur RSB  Respiratory: Effort normal and breath sounds normal. No respiratory distress.  GI: Soft. Bowel sounds are normal. She exhibits no distension and no mass. There is no tenderness.  Musculoskeletal: Normal range of motion. She exhibits no edema.  Lymphadenopathy:    She has no cervical adenopathy.  Neurological: She is alert and oriented to person, place, and time.  Skin: Skin is warm and dry.  Psychiatric: She has a normal mood and affect.                         *[facility]*                         [address], [place] [address]                            [phone]  ------------------------------------------------------------------- Transthoracic Echocardiography  Patient:    Kristen Bradley, Kristen Bradley MR #:       [id number] Study Date: 01/30/2016 Gender:     F Age:        27 Height:     165.1 cm Weight:     58.4 kg BSA:        1.64 m^2 Pt. Status: Room:       [place]  SONOGRAPHER  Tresa Res, RDCS  ADMITTING    [place], [place], Inpatient  cc:  ------------------------------------------------------------------- LV EF: 25%  ------------------------------------------------------------------- Indications:      CHF - 428.0.  ------------------------------------------------------------------- History:   PMH:   Coronary artery disease.  Stroke.  Risk factors: Current tobacco use. Hypertension. Diabetes mellitus.  ------------------------------------------------------------------- Study Conclusions  - Left ventricle: The cavity size was normal. Wall thickness was   increased in a pattern of mild LVH. The estimated ejection   fraction was 25%. Diffuse severe hypokinesis with septal-lateral   dyssynchrony. Doppler parameters are consistent with restrictive   physiology, indicative of decreased left ventricular diastolic   compliance and/or increased left atrial pressure. - Aortic valve: There was no  stenosis. - Mitral valve: There was mild to moderate regurgitation. - Left atrium: The atrium was moderately dilated. - Right ventricle: The cavity size was normal. Systolic function   was mildly reduced. - Tricuspid valve: Peak RV-RA gradient (S): 44 mm Hg. - Pulmonary arteries: PA peak pressure: 59 mm Hg (S). - Systemic veins: IVC measured 2.1 cm with < 50% respirophasic   variation, suggesting RA pressure 15 mmHg. - Pericardium, extracardiac: A trivial pericardial effusion was   identified posterior to the heart.  Impressions:  - Normal LV size with mild LV hypertrophy. EF 25% with severe   diffuse hypokinesis and septal-lateral dyssynchrony. Restrictive   diastolic function. Normal RV size with mildly decreased systolic   function. Moderate pulmonary hypertension. Mild to moderate MR.   Dilated IVC suggestive of elevated RV  filling pressure.  ------------------------------------------------------------------- Study data:   Study status:  Routine.  Study completion:  There were no complications.          Transthoracic echocardiography. M-mode, complete 2D, spectral Doppler, and color Doppler. Birthdate:  Patient birthdate: 09-Jan-1946.  Age:  Patient is 71 yr old.  Sex:  Gender: female.    BMI: 21.4 kg/m^2.  Blood pressure:   145/65  Patient status:  Inpatient.  Study date:  Study date: 01/30/2016. Study time: 10:30 AM.  Location:  Bedside.  -------------------------------------------------------------------  ------------------------------------------------------------------- Left ventricle:  The cavity size was normal. Wall thickness was increased in a pattern of mild LVH. The estimated ejection fraction was 25%. Diffuse severe hypokinesis with septal-lateral dyssynchrony. Doppler parameters are consistent with restrictive physiology, indicative of decreased left ventricular diastolic compliance and/or increased left atrial pressure.  ------------------------------------------------------------------- Aortic valve:   Trileaflet.  Doppler:   There was no stenosis. There was no regurgitation.  ------------------------------------------------------------------- Aorta:  Aortic root: The aortic root was normal in size. Ascending aorta: The ascending aorta was normal in size.  ------------------------------------------------------------------- Mitral valve:   Mildly calcified leaflets .  Doppler:   There was no evidence for stenosis.   There was mild to moderate regurgitation.    Peak gradient (D): 4 mm Hg.  ------------------------------------------------------------------- Left atrium:  The atrium was moderately dilated.  ------------------------------------------------------------------- Right ventricle:  The cavity size was normal. Systolic function was mildly  reduced.  ------------------------------------------------------------------- Pulmonic valve:    Structurally normal valve.   Cusp separation was normal.  Doppler:  Transvalvular velocity was within the normal range. There was trivial regurgitation.  ------------------------------------------------------------------- Tricuspid valve:   Doppler:  There was trivial regurgitation.   ------------------------------------------------------------------- Right atrium:  The atrium was normal in size.  ------------------------------------------------------------------- Pericardium:  A trivial pericardial effusion was identified posterior to the heart.  ------------------------------------------------------------------- Systemic veins:  IVC measured 2.1 cm with < 50% respirophasic variation, suggesting RA pressure 15 mmHg.  ------------------------------------------------------------------- Measurements   Left ventricle                              Value        Reference  LV ID, ED, PLAX chordal             (H)     52.6  mm     43 - 52  LV ID, ES, PLAX chordal             (H)     49.6  mm     23 - 38  LV fx shortening, PLAX chordal      (L)  6     %      >=29  LV PW thickness, ED                         14    mm     ---------  IVS/LV PW ratio, ED                         0.88         <=1.3  Stroke volume, 2D                           45    ml     ---------  Stroke volume/bsa, 2D                       27    ml/m^2 ---------  LV ejection fraction, 1-p A4C               26    %      ---------  LV end-diastolic volume, 2-p                125   ml     ---------  LV end-systolic volume, 2-p                 81    ml     ---------  LV ejection fraction, 2-p                   35    %      ---------  Stroke volume, 2-p                          44    ml     ---------  LV end-diastolic volume/bsa, 2-p            76    ml/m^2 ---------  LV end-systolic volume/bsa, 2-p             49    ml/m^2  ---------  Stroke volume/bsa, 2-p                      26.9  ml/m^2 ---------    Ventricular septum                          Value        Reference  IVS thickness, ED                           12.3  mm     ---------    LVOT                                        Value        Reference  LVOT ID, S                                  17    mm     ---------  LVOT area  2.27  cm^2   ---------  LVOT peak velocity, S                       100   cm/s   ---------  LVOT mean velocity, S                       59.8  cm/s   ---------  LVOT VTI, S                                 19.7  cm     ---------  LVOT peak gradient, S                       4     mm Hg  ---------    Aorta                                       Value        Reference  Aortic root ID, ED                          28    mm     ---------    Left atrium                                 Value        Reference  LA ID, A-P, ES                              43    mm     ---------  LA ID/bsa, A-P                      (H)     2.63  cm/m^2 <=2.2  LA volume, S                                68    ml     ---------  LA volume/bsa, S                            41.5  ml/m^2 ---------  LA volume, ES, 1-p A4C                      45    ml     ---------  LA volume/bsa, ES, 1-p A4C                  27.5  ml/m^2 ---------  LA volume, ES, 1-p A2C                      105   ml     ---------  LA volume/bsa, ES, 1-p A2C                  64.1  ml/m^2 ---------    Mitral valve  Value        Reference  Mitral E-wave peak velocity                 105   cm/s   ---------  Mitral A-wave peak velocity                 59.7  cm/s   ---------  Mitral deceleration time            (L)     130   ms     150 - 230  Mitral peak gradient, D                     4     mm Hg  ---------  Mitral E/A ratio, peak                      1.8          ---------  Mitral maximal regurg velocity,             559   cm/s    ---------  PISA  Mitral regurg VTI, PISA                     211   cm     ---------    Pulmonary arteries                          Value        Reference  PA pressure, S, DP                  (H)     59    mm Hg  <=30    Tricuspid valve                             Value        Reference  Tricuspid regurg peak velocity              333   cm/s   ---------  Tricuspid peak RV-RA gradient               44    mm Hg  ---------    Systemic veins                              Value        Reference  Estimated CVP                               15    mm Hg  ---------    Right ventricle                             Value        Reference  RV s&', lateral, S                           8.01  cm/s   ---------  Legend: (L)  and  (H)  mark values outside specified reference range.  ------------------------------------------------------------------- Prepared and Electronically Authenticated by  Loralie Champagne, M.D. 2017-12-29T17:57:54  [name]  Cardiac catheterization  Order# [id number]  Reading physician: Burnell Blanks,  MD Ordering physician: Burnell Blanks, MD Study date: 02/03/16  Physicians   Panel Physicians Referring Physician Case Authorizing Physician  Burnell Blanks, MD (Primary)    Procedures   Right/Left Heart Cath and Coronary Angiography  Conclusion     Ost RCA to Prox RCA lesion, 60 %stenosed.  Mid RCA lesion, 80 %stenosed.  Dist RCA lesion, 60 %stenosed.  RPDA lesion, 99 %stenosed.  Ost LM lesion, 50 %stenosed.  Ost Cx to Prox Cx lesion, 70 %stenosed.  Ost 3rd Mrg to 3rd Mrg lesion, 99 %stenosed.  2nd Mrg lesion, 50 %stenosed.  Ost 2nd Mrg to 2nd Mrg lesion, 20 %stenosed.  Prox Cx to Mid Cx lesion, 20 %stenosed.  Mid LAD lesion, 90 %stenosed.  1st Diag lesion, 90 %stenosed.  Dist LAD lesion, 80 %stenosed.  LV end diastolic pressure is normal.  Mid LAD to Dist LAD lesion, 20 %stenosed.  Ost LAD to Prox LAD lesion, 20  %stenosed.   1. Severe calcific three vessel CAD 2. Normal filling pressures 3. Ischemic cardiomyopathy (LV function reduced by echo)  Recommendations: She has severe calcification in all three vessels. Her anatomy is not favorable for PCI. I will ask CT surgery to see her to discuss CABG. Plavix on hold for last 4 days. She could possibly go home before bypass surgery and return for the procedure.    Indications   NSTEMI (non-ST elevated myocardial infarction) ([place]) [I21.4 (ICD-10-CM)]  Procedural Details/Technique   Technical Details Indication: 71 yo female with history of severe PAD, DM, tobacco abuse, HTN, HLD, CVA who was admitted with dyspnea. Troponin elevated. Echo with moderate to severe reduction of LV systolic function.   Procedure: The risks, benefits, complications, treatment options, and expected outcomes were discussed with the patient. The patient and/or family concurred with the proposed plan, giving informed consent. The patient was brought to the cath lab after IV hydration was begun and oral premedication was given. The patient was further sedated with Versed and Fentanyl. There was an IV catheter present in the right antecubital vein. This area was prepped and draped. A changed out the IV catheter for a 5 French sheath. A right heart catheterization was performed with a balloon tipped catheter. The right wrist was prepped and draped in a sterile fashion. 1% lidocaine was used for local anesthesia. Using the modified Seldinger access technique, a 5 French sheath was placed in the right radial artery. 3 mg Verapamil was given through the sheath. 3500 units IV heparin was given. Standard diagnostic catheters were used to perform selective coronary angiography. LV pressures measured. No LV gram performed. The sheath was removed from the right radial artery and a Terumo hemostasis band was applied at the arteriotomy site on the right wrist.     Estimated blood loss <50  mL.  During this procedure the patient was administered the following to achieve and maintain moderate conscious sedation: Versed 2 mg, Fentanyl 50 mcg, while the patient's heart rate, blood pressure, and oxygen saturation were continuously monitored. The period of conscious sedation was 33 minutes, of which I was present face-to-face 100% of this time.    Complications   Complications documented before study signed (02/03/2016 5:06 PM EST)    RIGHT/LEFT HEART CATH AND CORONARY ANGIOGRAPHY   None Documented by Burnell Blanks, MD 02/03/2016 5:05 PM EST  Time Range: Intra-procedure      Coronary Findings   Dominance: Right  Left Main  Ost LM lesion, 50% stenosed. The lesion is calcified.  Left Anterior Descending  Ost LAD to Prox LAD lesion, 20% stenosed. The lesion is calcified.  Mid LAD lesion, 90% stenosed. The lesion is calcified.  Mid LAD to Dist LAD lesion, 20% stenosed. The lesion is calcified.  Dist LAD lesion, 80% stenosed.  First Diagonal Branch  Vessel is large in size.  1st Diag lesion, 90% stenosed.  Second Diagonal Branch  Vessel is small in size.  Third Diagonal Branch  Vessel is small in size.  Left Circumflex  Vessel is large.  Ost Cx to Prox Cx lesion, 70% stenosed.  Prox Cx to Mid Cx lesion, 20% stenosed. The lesion is calcified.  First Obtuse Marginal Branch  Vessel is small in size.  Second Obtuse Marginal Branch  Vessel is large in size.  Ost 2nd Mrg to 2nd Mrg lesion, 20% stenosed. The lesion is calcified.  2nd Mrg lesion, 50% stenosed.  Third Obtuse Marginal Branch  Vessel is small in size.  Ost 3rd Mrg to 3rd Mrg lesion, 99% stenosed.  Right Coronary Artery  Ost RCA to Prox RCA lesion, 60% stenosed.  Mid RCA lesion, 80% stenosed.  Dist RCA lesion, 60% stenosed.  Right Posterior Descending Artery  RPDA lesion, 99% stenosed.  Right Heart   Right Heart Pressures LV EDP is normal.    Coronary Diagrams   Diagnostic Diagram      Implants     No implant documentation for this case.  PACS Images   Show images for Cardiac catheterization   Link to Procedure Log   Procedure Log    Hemo Data   Flowsheet Row Most Recent Value  Fick Cardiac Output 4.46 L/min  Fick Cardiac Output Index 2.72 (L/min)/BSA  RA A Wave 4 mmHg  RA V Wave 2 mmHg  RA Mean 1 mmHg  RV Systolic Pressure 34 mmHg  RV Diastolic Pressure 0 mmHg  RV EDP 4 mmHg  PA Systolic Pressure 35 mmHg  PA Diastolic Pressure 9 mmHg  PA Mean 19 mmHg  PW A Wave 9 mmHg  PW V Wave 7 mmHg  PW Mean 6 mmHg  AO Systolic Pressure 096 mmHg  AO Diastolic Pressure 56 mmHg  AO Mean 92 mmHg  LV Systolic Pressure 045 mmHg  LV Diastolic Pressure 3 mmHg  LV EDP 9 mmHg  Arterial Occlusion Pressure Extended Systolic Pressure 409 mmHg  Arterial Occlusion Pressure Extended Diastolic Pressure 54 mmHg  Arterial Occlusion Pressure Extended Mean Pressure 91 mmHg  Left Ventricular Apex Extended Systolic Pressure 811 mmHg  Left Ventricular Apex Extended Diastolic Pressure 23 mmHg  Left Ventricular Apex Extended EDP Pressure 55 mmHg  QP/QS 1  TPVR Index 6.99 HRUI  TSVR Index 33.84 HRUI  PVR SVR Ratio 0.14  TPVR/TSVR Ratio 0.21    Assessment/Plan:  This 71 year old patient has diffuse calcific vascular disease and severe diffuse calcific three vessel coronary artery disease with severe LV dysfunction. She has not had any chest pain or pressure but presented with acute progressive shortness of breath with an EF that has gone from 60% in 2013 to 25% now. Her troponin was mildly positive and her BNP > 1000 so her symptoms are likely a combination of ischemia and congestive heart failure from cardiomyopathy. I have personally reviewed her echo and cath films. Her coronary disease would be best treated by CABG except that she has concentric calcification of her entire ascending, arch and descending aorta by CT in 2015 and the ascending aorta appears calcified by cath now. I  don't think  CABG is an option for her given this degree of calcification which would preclude cannulation and cross-clamping. Her coronaries are diffusely diseased and calcified so off-pump CABG is not an option. I would consider if there is any PCI option and if not continue medical therapy. I have discussed this with her and her son.  I spent 60 minutes performing this consultation and > 50% of this time was spent face to face counseling and coordinating the care of this patient's severe multi-vessel coronary artery disease.  Gaye Pollack 02/04/2016, 12:41 PM

## 2016-02-04 NOTE — Care Management Note (Signed)
Case Management Note  Patient Details  Name: Kristen Bradley MRN: [id number] Date of Birth: 03-17-1945  Subjective/Objective:    S/p cath has severe 3 vessel disease, will need CABG, will dc and come back, conts on plavix and asa.                  Action/Plan:   Expected Discharge Date:                  Expected Discharge Plan:  Home/Self Care  In-House Referral:     Discharge planning Services  CM Consult  Post Acute Care Choice:    Choice offered to:     DME Arranged:    DME Agency:     HH Arranged:    HH Agency:     Status of Service:  Completed, signed off  If discussed at [employer] of Stay Meetings, dates discussed:    Additional Comments:  Zenon Mayo, RN 02/04/2016, 2:10 PM

## 2016-02-04 NOTE — Telephone Encounter (Signed)
Appt scheduled w/[facility] on 1/23 at 3:15pm. Spoke to the pt's sgo and he stated that the pt is about to undergo bypass surgery soon. Not sure if the pt will be making the appt. However, he didn't want to cancel the appt. I explained that if they need to cancel then give me a call. A letter w/the appt date and time mailed.

## 2016-02-04 NOTE — Discharge Summary (Addendum)
[facility] Discharge Summary   Patient: Kristen Bradley EHM:[id number]   PCP: Cammy Copa, MD DOB: October 19, 1945   Date of admission: 01/29/2016   Date of discharge:  02/04/2016    Discharge Diagnoses:  Principal Problem:   Acute respiratory failure ([place]) Active Problems:   Peripheral vascular disease ([place])   Carotid stenosis, bilateral-s/p Bilateral CEA   Atherosclerosis of native artery of extremity with intermittent claudication ([place])   Community acquired pneumonia   Hypertension   Acute kidney injury ([place])   Diabetes mellitus with complication ([place])   LBBB- new   Elevated troponin I level   Thrombocytopenia- plts 81K   Elevated brain natriuretic peptide (BNP) level   Renal failure syndrome   Sinus pause   NSTEMI (non-ST elevated myocardial infarction) ([place])   Cardiomyopathy, ischemic   Acute renal failure superimposed on stage 3 chronic kidney disease ([place])   Sinus bradycardia   Admitted From: home Disposition:  home  Recommendations for Outpatient Follow-up:  1. This follow-up with PCP in one week. 2. Please follow-up with hematology as recommended. 3. Please follow-up with cardiology in 2-4 weeks.  Follow-up Information    Cammy Copa, MD. Schedule an appointment as soon as possible for a visit in 1 week(s).   Specialty:  Family Medicine Why:  get hemoglobin A1c checked Contact information: [address], [address] [place] [address] [phone]        Candee Furbish, MD. Schedule an appointment as soon as possible for a visit in 1 month(s).   Specialty:  Cardiology Contact information: [address] [place] [address] [phone]        Chauncey Cruel, MD. Daphane Shepherd on 02/24/2016.   Specialty:  Oncology Contact information: [place] [address] [phone]          Diet recommendation: Cardiac and carb modified diet  Activity: The patient is advised to gradually reintroduce usual  activities.  Discharge Condition: good  Code Status: Full code  History of present illness: As per the H and P dictated on admission, "Kristen Bradley is a 71 y.o. female with medical history significant of HTN, HLD, DM type II, hep C, PVD with carotid artery and renal artery stenosis, CAD with previous PCI, tobacco abuse who presented to the ED with acute onset SOB. Patient reported being in her usual state of health until last night when she abruptly developed acute onset of progressive SOB. Patient clearly a mass and when they arrived her oxygen saturation was down to 87% on ambient air. "  Hospital Course:  The ED patient was placed on BiPAP and then transitioned to nasal cannula, started on IV Lasix and IV Solu-Medrol later on was started on IV heparin and cardiology was consulted.  Summary of her active problems in the hospital is as following.  Acute systolic CHF (HCC) NSTEMI Severe triple vessel coronary artery disease  ischemic cardiomyopathy. 2-D echo with EF of 25% and diffuse severe hypokinesis. Noted for LVH. Pulmonary artery pressure of 59 mmHg.  Not on ACEi/ ARB due to CKD. Lasix discontinued due to underlying CKD and patient euvolemic. Toprol dose reduced due to sinus bradycardia.  Was treated with heparin drip.  cardiac catheterization shows severely calcified triple-vessel disease. Cardiology consulted with cardiothoracic surgery, given the severe calcification patient is not a candidate for any surgical intervention-CABG. If the patient continues to have reoccurrence of symptom, cardiology may consider high risk multivessel atherectomy PCI. Currently recommends continue Imdur and Plavix  and follow up as an outpatient. We'll discharge on when necessary Lasix  Suspected Lobar pneumonia Seen on CXR. Shortness of breath is possibly due to CHF.  Finished 5 days of IV antibiotics in the hospital. Blood cultures negative.  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 No prior evaluation or workup done. Peripheral smear reviewed by hematologist, negative for schistocytes. Suspects moderate ITP. No intervention needed as long as platelet count >50 K .  Sinus bradycardia with short pauses Discontinue atenolol and discharge continue low-dose Toprol.   Peripheral vascular disease ([place]) On plavix. Not taking pletal since long, Recommend to discuss with PCP prior to resuming.   Diabetes mellitus type 2 in nonobese (HCC) Metformin on hold.  Insulin requirement in the hospital. Patient will need repeat A1c with PCP.  Acute on chronic kidney disease stage III (HCC) Hold HCTZ and ARB as well as metformin.  avoid nephrotoxic agents.   Hypokalemia Replenished  All other chronic medical condition were stable during the hospitalization.  Patient was ambulatory without any assistance. On the day of the discharge the patient's vitals were stable, and no other acute medical condition were reported by patient. the patient was felt safe to be discharge at home with family.  Procedures and Results: ECHO 01/30/16: - Normal LV size with mild LV hypertrophy. EF 25% with severediffuse hypokinesis and septal-lateral dyssynchrony. Restrictivediastolic function. Normal RV size with mildly decreased systolicfunction. Moderate pulmonary hypertension. Mild to moderate MR. Dilated IVC suggestive of elevated RV filling pressure.  Cath 02/04/16:  Ost RCA to Prox RCA lesion, 60 %stenosed.  Mid RCA lesion, 80 %stenosed.  Dist RCA lesion, 60 %stenosed.  RPDA lesion, 99 %stenosed.  Ost LM lesion, 50 %stenosed.  Ost Cx to Prox Cx lesion, 70 %stenosed.  Ost 3rd Mrg to 3rd Mrg lesion, 99 %stenosed.  2nd Mrg lesion, 50 %stenosed.  Ost 2nd Mrg to 2nd Mrg lesion, 20 %stenosed.  Prox Cx to Mid Cx lesion, 20 %stenosed.  Mid LAD lesion, 90 %stenosed.  1st Diag lesion, 90 %stenosed.  Dist LAD  lesion, 80 %stenosed.  LV end diastolic pressure is normal.  Mid LAD to Dist LAD lesion, 20 %stenosed.  Ost LAD to Prox LAD lesion, 20 %stenosed.  1. Severe calcific three vessel CAD 2. Normal filling pressures 3. Ischemic cardiomyopathy (LV function reduced by echo)    BiPAP   Consultations:  Cardiology  Hematology oncology  DISCHARGE MEDICATION: Discharge Medication List as of 02/04/2016  1:31 PM    START taking these medications   Details  furosemide (LASIX) 20 MG tablet Take 1 tablet (20 mg total) by mouth daily as needed for fluid or edema (For weight gain more than 3 pounds in one day)., Starting Wed 02/04/2016, Print    isosorbide mononitrate (IMDUR) 30 MG 24 hr tablet Take 1 tablet (30 mg total) by mouth daily., Starting Wed 02/04/2016, Print    metoprolol succinate (TOPROL-XL) 25 MG 24 hr tablet Take 1 tablet (25 mg total) by mouth daily., Starting Thu 02/05/2016, Print    nitroGLYCERIN (NITROSTAT) 0.4 MG SL tablet Place 1 tablet (0.4 mg total) under the tongue every 5 (five) minutes as needed for chest pain., Starting Wed 02/04/2016, Print    polyethylene glycol (MIRALAX / GLYCOLAX) packet Take 17 g by mouth daily as needed for mild constipation., Starting Wed 02/04/2016, Print      CONTINUE these medications which have NOT CHANGED   Details  amLODipine (NORVASC) 10 MG tablet Take 10 mg by mouth at bedtime.  , Until Discontinued, Historical Med    aspirin EC 81 MG tablet Take 81 mg by mouth daily.  , Until Discontinued, Historical Med    cloNIDine (CATAPRES) 0.1 MG tablet Take 0.1 mg by mouth 2 (two) times daily.  , Until Discontinued, Historical Med    clopidogrel (PLAVIX) 75 MG tablet Take 1 tablet (75 mg total) by mouth daily., Starting 01/05/2012, Until Discontinued, Print    PRODIGY NO CODING BLOOD GLUC test strip 3 (three) times daily. , Starting Wed 03/08/2012, Historical Med    PRODIGY TWIST TOP LANCETS 28G MISC 3 (three) times daily. , Starting 12/17/2011,  Until Discontinued, Historical Med    simvastatin (ZOCOR) 40 MG tablet Take 40 mg by mouth daily. , Starting 01/12/2012, Until Discontinued, Historical Med    traMADol (ULTRAM) 50 MG tablet Take 1 tablet (50 mg total) by mouth every 6 (six) hours as needed., Starting Wed 02/09/2012, Print    cilostazol (PLETAL) 100 MG tablet Take 1 tablet (100 mg total) by mouth 2 (two) times daily before a meal., Starting Fri 09/19/2015, Print      STOP taking these medications     atenolol (TENORMIN) 100 MG tablet      hydrochlorothiazide (HYDRODIURIL) 12.5 MG tablet      losartan (COZAAR) 100 MG tablet      metFORMIN (GLUCOPHAGE) 500 MG tablet        Allergies  Allergen Reactions  . Chlorhexidine Gluconate Itching  . Ace Inhibitors Cough  . Dilaudid [Hydromorphone Hcl] Itching   Discharge Instructions    Diet - low sodium heart healthy    Complete by:  As directed    Discharge instructions    Complete by:  As directed    It is important that you read following instructions as well as go over your medication list with RN to help you understand your care after this hospitalization.  Discharge Instructions: Please follow-up with PCP in one week  Please request your primary care physician to go over all Hospital Tests and Procedure/Radiological results at the follow up,  Please get all Hospital records sent to your PCP by signing hospital release before you go home.   Do not take more than prescribed Pain, Sleep and Anxiety Medications. You were cared for by a hospitalist during your hospital stay. If you have any questions about your discharge medications or the care you received while you were in the hospital after you are discharged, you can call the unit and ask to speak with the hospitalist on call if the hospitalist that took care of you is not available.  Once you are discharged, your primary care physician will handle any further medical issues. Please note that NO REFILLS for any  discharge medications will be authorized once you are discharged, as it is imperative that you return to your primary care physician (or establish a relationship with a primary care physician if you do not have one) for your aftercare needs so that they can reassess your need for medications and monitor your lab values. You Must read complete instructions/literature along with all the possible adverse reactions/side effects for all the Medicines you take and that have been prescribed to you. Take any new Medicines after you have completely understood and accept all the possible adverse reactions/side effects. Wear Seat belts while driving. If you have smoked or chewed Tobacco in the last 2 yrs please stop smoking and/or stop  any Recreational drug use.   Increase activity slowly    Complete by:  As directed      Discharge Exam: Filed Weights   02/01/16 0537 02/02/16 0544 02/04/16 0211  Weight: 59.8 kg (131 lb 13.4 oz) 58.1 kg (128 lb 1.4 oz) 59 kg (130 lb 1.1 oz)   Vitals:   02/04/16 0336 02/04/16 0807  BP: (!) 144/42 (!) 158/33  Pulse:  64  Resp: 19 19  Temp:  98 F (36.7 C)   General: Appear in no distress, no Rash; Oral Mucosa moist. Cardiovascular: S1 and S2 Present, no Murmur, no JVD Respiratory: Bilateral Air entry present and Clear to Auscultation, no Crackles, no wheezes Abdomen: Bowel Sound present, Soft and no tenderness Extremities: no Pedal edema, no calf tenderness Neurology: Grossly no focal neuro deficit.  The results of significant diagnostics from this hospitalization (including imaging, microbiology, ancillary and laboratory) are listed below for reference.    Significant Diagnostic Studies: Dg Chest Portable 1 View  Result Date: 01/29/2016 CLINICAL DATA:  Shortness of breath EXAM: PORTABLE CHEST 1 VIEW COMPARISON:  Chest CT 08/16/2013 FINDINGS: There is atherosclerotic calcification in the aortic arch. Cardiomediastinal contours are otherwise normal. There is  increased airspace opacity in the right lower lobe. No pleural effusion or pneumothorax. IMPRESSION: Increased opacity in the right lower lobe is concerning for pneumonia. Electronically Signed   By: Ulyses Jarred M.D.   On: 01/29/2016 02:50    Microbiology: Recent Results (from the past 240 hour(s))  Culture, sputum-assessment     Status: None   Collection Time: 01/29/16  8:23 AM  Result Value Ref Range Status   Specimen Description EXPECTORATED SPUTUM  Final   Special Requests NONE  Final   Sputum evaluation   Final    Sputum specimen not acceptable for testing.  Please recollect.   Results Called to: G. GLEASON, RN AT [place] ON 01/29/16 BY C. JESSUP, MLT.    Report Status 01/29/2016 FINAL  Final  Culture, blood (routine x 2) Call MD if unable to obtain prior to antibiotics being given     Status: None   Collection Time: 01/29/16  9:07 AM  Result Value Ref Range Status   Specimen Description BLOOD LEFT HAND  Final   Special Requests BOTTLES DRAWN AEROBIC AND ANAEROBIC  5CC  Final   Culture NO GROWTH 5 DAYS  Final   Report Status 02/03/2016 FINAL  Final  Culture, blood (routine x 2) Call MD if unable to obtain prior to antibiotics being given     Status: None   Collection Time: 01/29/16 11:52 AM  Result Value Ref Range Status   Specimen Description BLOOD RIGHT HAND  Final   Special Requests BOTTLES DRAWN AEROBIC ONLY  5CC  Final   Culture NO GROWTH 5 DAYS  Final   Report Status 02/03/2016 FINAL  Final     Labs: CBC:  Recent Labs Lab 01/30/16 0330 01/31/16 0615 02/01/16 0448 02/02/16 0311 02/03/16 1304 02/04/16 0229  WBC 6.9 3.7*  --  3.4* 3.1* 3.3*  HGB 11.0* 11.7*  --  10.8* 11.6* 11.2*  HCT 33.2* 36.9  --  32.9* 36.4 34.9*  MCV 87.6 90.9  --  90.1 91.0 90.6  PLT 81* 85* 68* 77* 99* 87*   Basic Metabolic Panel:  Recent Labs Lab 01/31/16 0615 02/01/16 0753 02/02/16 0311 02/03/16 1304 02/04/16 0229  NA 140 138 138 141 141  K 3.6 3.6 3.7 4.8 4.0  CL 104 104 103  111 111  CO2 25 25 25 23 23   GLUCOSE 145* 161* 114* 132* 112*  BUN 29* 27* 32* 32* 29*  CREATININE 1.77* 1.82* 1.83* 1.67* 1.73*  CALCIUM 10.6* 10.2 10.0 9.8 10.0   Liver Function Tests:  Recent Labs Lab 01/29/16 0229 01/30/16 0330  AST 30 20  ALT 19 14  ALKPHOS 107 69  BILITOT 0.6 0.8  PROT 7.5 6.3*  ALBUMIN 3.7 3.2*   No results for input(s): LIPASE, AMYLASE in the last 168 hours. No results for input(s): AMMONIA in the last 168 hours. Cardiac Enzymes:  Recent Labs Lab 01/29/16 1456 01/29/16 1931 02/01/16 2121 02/02/16 0311 02/02/16 1021  TROPONINI 0.48* 0.40* 0.16* 0.14* 0.12*   BNP (last 3 results)  Recent Labs  01/29/16 0347  BNP 1,091.5*   CBG:  Recent Labs Lab 02/03/16 0739 02/03/16 1233 02/03/16 1707 02/03/16 1953 02/04/16 0618  GLUCAP 113* 137* 113* 109* 125*   Time spent: 30 minutes  Signed:  Shaunak Kreis  [facility]  02/04/2016  , 12:37 PM

## 2016-02-04 NOTE — Progress Notes (Addendum)
Patient Name: Kristen Bradley Date of Encounter: 02/04/2016  Primary Cardiologist: Dr [facility] Problem List     Principal Problem:   Acute respiratory failure [facility]) Active Problems:   Peripheral vascular disease (HCC)   Carotid stenosis, bilateral-s/p Bilateral CEA   Atherosclerosis of native artery of extremity with intermittent claudication ([place])   Community acquired pneumonia   Hypertension   Acute kidney injury ([place])   Diabetes mellitus with complication ([place])   LBBB- new   Elevated troponin I level   Thrombocytopenia- plts 81K   Elevated brain natriuretic peptide (BNP) level   Renal failure syndrome   Sinus pause   NSTEMI (non-ST elevated myocardial infarction) ([place])   Cardiomyopathy, ischemic   Acute renal failure superimposed on stage 3 chronic kidney disease (HCC)   Sinus bradycardia     Subjective   Feels ok post cath. No CP, no SOB. Awaiting discussion with TCTS.   Inpatient Medications    Scheduled Meds: . amLODipine  10 mg Oral QHS  . aspirin EC  81 mg Oral Daily  . cloNIDine  0.1 mg Oral BID  . insulin aspart  0-9 Units Subcutaneous TID WC  . metoprolol succinate  25 mg Oral Daily  . polyethylene glycol  17 g Oral Daily  . simvastatin  40 mg Oral QHS  . sodium chloride flush  3 mL Intravenous Q12H   Continuous Infusions:  PRN Meds: sodium chloride, acetaminophen **OR** acetaminophen, albuterol, hydrALAZINE, nitroGLYCERIN, ondansetron **OR** ondansetron (ZOFRAN) IV, polyethylene glycol, sodium chloride flush, traMADol   Vital Signs    Vitals:   02/04/16 0211 02/04/16 0306 02/04/16 0336 02/04/16 0807  BP: (!) 177/65 (!) 188/65 (!) 144/42 (!) 158/33  Pulse: 60   64  Resp: 20  19 19   Temp: 98.3 F (36.8 C)   98 F (36.7 C)  TempSrc: Oral   Oral  SpO2: 100%   100%  Weight: 130 lb 1.1 oz (59 kg)     Height:        Intake/Output Summary (Last 24 hours) at 02/04/16 0848 Last data filed at 02/04/16 0655  Gross per 24 hour  Intake            726.67 ml  Output             2000 ml  Net         -1273.33 ml   Filed Weights   02/01/16 0537 02/02/16 0544 02/04/16 0211  Weight: 131 lb 13.4 oz (59.8 kg) 128 lb 1.4 oz (58.1 kg) 130 lb 1.1 oz (59 kg)    Physical Exam    GEN: Well nourished, well developed, in no acute distress.  HEENT: Grossly normal.  Neck: Supple, no JVD, carotid bruits, or masses. Cardiac: RRR, 2/6 murmur, no rubs, or gallops. No clubbing, cyanosis, edema.  Radials/DP/PT 2+ and equal bilaterally.  Respiratory:  Respirations regular and unlabored, clear to auscultation bilaterally. GI: Soft, nontender, nondistended, BS + x 4. MS: no deformity or atrophy. Skin: warm and dry, no rash. Radial cath site normal Neuro:  Strength and sensation are intact. Psych: AAOx3.  Normal affect.  Labs    CBC  Recent Labs  02/03/16 1304 02/04/16 0229  WBC 3.1* 3.3*  HGB 11.6* 11.2*  HCT 36.4 34.9*  MCV 91.0 90.6  PLT 99* 87*   Basic Metabolic Panel  Recent Labs  02/03/16 1304 02/04/16 0229  NA 141 141  K 4.8 4.0  CL 111 111  CO2 23 23  GLUCOSE 132* 112*  BUN 32* 29*  CREATININE 1.67* 1.73*  CALCIUM 9.8 10.0   Recent Labs  02/01/16 2121 02/02/16 0311 02/02/16 1021  TROPONINI 0.16* 0.14* 0.12*    Telemetry    2.2 second pause previously noted, sinus rhythm/sinus bradycardia, underlying left bundle branch block - Personally Reviewed  ECG    Normal sinus rhythm, left bundle branch block - Personally Reviewed  Radiology    No results found.  Cardiac Studies   ECHO 01/30/16: - Normal LV size with mild LV hypertrophy. EF 25% with severe  diffuse hypokinesis and septal-lateral dyssynchrony. Restrictive  diastolic function. Normal RV size with mildly decreased systolic  function. Moderate pulmonary hypertension. Mild to moderate MR.   Dilated IVC suggestive of elevated RV filling pressure.  Cath 02/04/16:  Ost RCA to Prox RCA lesion, 60 %stenosed.  Mid RCA lesion, 80 %stenosed.  Dist RCA  lesion, 60 %stenosed.  RPDA lesion, 99 %stenosed.  Ost LM lesion, 50 %stenosed.  Ost Cx to Prox Cx lesion, 70 %stenosed.  Ost 3rd Mrg to 3rd Mrg lesion, 99 %stenosed.  2nd Mrg lesion, 50 %stenosed.  Ost 2nd Mrg to 2nd Mrg lesion, 20 %stenosed.  Prox Cx to Mid Cx lesion, 20 %stenosed.  Mid LAD lesion, 90 %stenosed.  1st Diag lesion, 90 %stenosed.  Dist LAD lesion, 80 %stenosed.  LV end diastolic pressure is normal.  Mid LAD to Dist LAD lesion, 20 %stenosed.  Ost LAD to Prox LAD lesion, 20 %stenosed.   1. Severe calcific three vessel CAD 2. Normal filling pressures 3. Ischemic cardiomyopathy (LV function reduced by echo)  Recommendations: She has severe calcification in all three vessels. Her anatomy is not favorable for PCI. I will ask CT surgery to see her to discuss CABG. Plavix on hold for last 4 days. She could possibly go home before bypass surgery and return for the procedure.    Patient Profile     71 year old with newly discovered severe three vessel CAD with  acute systolic heart failure/dilated cardiomyopathy EF 25% with history of peripheral vascular disease status post right renal artery stenting 2008, right and left carotid endarterectomy in 2013, 2014, lower extremity claudication with ABIs of 0.5 and 0.45 right and left followed by Dr. [name] with prior normal echocardiogram in 2013 admitted with acute shortness of breath, mildly elevated troponin of 0.48, new left bundle branch block.  Assessment & Plan    Severe three vessel CAD  - TCTS consult. Dr. Angelena Form placed call on 1/2 and discussed. She is now off Plavix since 12/29 in case there is availability. If not, she may be able to be DC and come back for surgery. Note thrombocytopenia chronic, 80, see heme note.   Acute systolic heart failure/dilated cardiomyopathy  - Mildly elevated troponin consistent with demand ischemia in the setting  - Optimizing medications, given her 2.2 second pause,  decreased  her Toprol to 25 mg a day.  - Avoiding ACE inhibitor/ARB given creatinine of 1.7-1.8 which now appears to be new baseline.  -  She appears fairly euvolemic at this point, comfortable, no orthopnea. I stopped her Lasix   - I've also stopped her Plavix 75 mg on 01/30/16  - I stopped IV heparin use after 48 hrs of use. Obviously if she has any anginal symptoms we can always restart prior to cardiac catheterization.  Sinus pause 2.2 seconds 01/30/16 and 12/30  - Decreased metoprolol to 25 mg.   Chronic kidney disease (3)  - Creatinine was 0.74  in 2013, 1.25 in 2014 and 1.66 on admission 01/29/16.  - Stopped Lasix 12/30. No ARB  Thrombocytopenia  - chronic 80's  - Appreciate Dr. Jana Hakim consult  - He said ok to use antiplts if needed.   Dispo  - await TCTS consultation   - discussed with Dr. Posey Pronto  Signed, Candee Furbish, MD  02/04/2016, 8:48 AM   Addendum:  I spoke personally with Dr. Cyndia Bent and Dr. Angelena Form. Unfortunately given the diffuse calcification of her aorta and severely calcified vessels, she would not be a candidate for bypass surgery. Percutaneous intervention would also be a very challenging option for her and would need to be performed with atherectomy. For now, medical management, I will add isosorbide 30 mg. If anginal symptoms worsen or become more worrisome, Dr. Angelena Form stated that we could possibly entertain complex high risk atherectomy PCI procedure. For now however, first do no harm.  I will restart Plavix.  I think it is reasonable for her to be discharged home. We will have follow-up in our clinic in 2-4 weeks.  Candee Furbish, MD

## 2016-02-10 DIAGNOSIS — I2584 Coronary atherosclerosis due to calcified coronary lesion: Secondary | ICD-10-CM | POA: Diagnosis not present

## 2016-02-10 DIAGNOSIS — I5021 Acute systolic (congestive) heart failure: Secondary | ICD-10-CM | POA: Diagnosis not present

## 2016-02-10 DIAGNOSIS — D696 Thrombocytopenia, unspecified: Secondary | ICD-10-CM | POA: Diagnosis not present

## 2016-02-10 DIAGNOSIS — I6523 Occlusion and stenosis of bilateral carotid arteries: Secondary | ICD-10-CM | POA: Diagnosis not present

## 2016-02-10 DIAGNOSIS — I6529 Occlusion and stenosis of unspecified carotid artery: Secondary | ICD-10-CM | POA: Diagnosis not present

## 2016-02-10 DIAGNOSIS — N289 Disorder of kidney and ureter, unspecified: Secondary | ICD-10-CM | POA: Diagnosis not present

## 2016-02-24 ENCOUNTER — Ambulatory Visit (HOSPITAL_BASED_OUTPATIENT_CLINIC_OR_DEPARTMENT_OTHER): Payer: Medicare HMO | Admitting: Oncology

## 2016-02-24 ENCOUNTER — Other Ambulatory Visit (HOSPITAL_BASED_OUTPATIENT_CLINIC_OR_DEPARTMENT_OTHER): Payer: Medicare HMO

## 2016-02-24 VITALS — BP 133/55 | HR 87 | Temp 98.1°F | Resp 18 | Ht 65.0 in | Wt 135.3 lb

## 2016-02-24 DIAGNOSIS — D696 Thrombocytopenia, unspecified: Secondary | ICD-10-CM

## 2016-02-24 DIAGNOSIS — I739 Peripheral vascular disease, unspecified: Secondary | ICD-10-CM

## 2016-02-24 DIAGNOSIS — D693 Immune thrombocytopenic purpura: Secondary | ICD-10-CM | POA: Insufficient documentation

## 2016-02-24 DIAGNOSIS — E118 Type 2 diabetes mellitus with unspecified complications: Secondary | ICD-10-CM

## 2016-02-24 DIAGNOSIS — I214 Non-ST elevation (NSTEMI) myocardial infarction: Secondary | ICD-10-CM

## 2016-02-24 DIAGNOSIS — N179 Acute kidney failure, unspecified: Secondary | ICD-10-CM

## 2016-02-24 HISTORY — DX: Immune thrombocytopenic purpura: D69.3

## 2016-02-24 LAB — CBC WITH DIFFERENTIAL/PLATELET
BASO%: 0.4 % (ref 0.0–2.0)
Basophils Absolute: 0 10*3/uL (ref 0.0–0.1)
EOS%: 0.1 % (ref 0.0–7.0)
Eosinophils Absolute: 0 10*3/uL (ref 0.0–0.5)
HEMATOCRIT: 32.8 % — AB (ref 34.8–46.6)
HEMOGLOBIN: 10.7 g/dL — AB (ref 11.6–15.9)
LYMPH#: 0.7 10*3/uL — AB (ref 0.9–3.3)
LYMPH%: 15 % (ref 14.0–49.7)
MCH: 29.2 pg (ref 25.1–34.0)
MCHC: 32.7 g/dL (ref 31.5–36.0)
MCV: 89.1 fL (ref 79.5–101.0)
MONO#: 0.6 10*3/uL (ref 0.1–0.9)
MONO%: 11.9 % (ref 0.0–14.0)
NEUT%: 72.6 % (ref 38.4–76.8)
NEUTROS ABS: 3.6 10*3/uL (ref 1.5–6.5)
PLATELETS: 87 10*3/uL — AB (ref 145–400)
RBC: 3.68 10*6/uL — ABNORMAL LOW (ref 3.70–5.45)
RDW: 14.7 % — ABNORMAL HIGH (ref 11.2–14.5)
WBC: 4.9 10*3/uL (ref 3.9–10.3)

## 2016-02-24 NOTE — Progress Notes (Signed)
Kristen Bradley  [phone] [phone]     ID: KEHLANI VANCAMP DOB: Jun 12, 1945  MR#: [id number]  YJE#:[id number]  Patient Care Team: [name], MD as PCP - General (Family Medicine) Chauncey Cruel, MD OTHER MD:  CHIEF COMPLAINT: Immune thrombocytopenia  CURRENT TREATMENT: Observation   HISTORY OF PRESENT ILLNESS: From the original consult note dated 01/31/2016  "Patient is a poor historian (did not mention her prior stroke or history of non invasive breast cancer until brought up by her son) but she presented to the ED 01/28/2017 with worsening SOB. Workup this admission suggests NSTEMI and finds an EF of 25% (was 60% 12/07/2011). She is on heparin and 81 mg ASA pending cardiac catheterization planned for 02/03/2016  We were consulted because of thrombocytopenia. Her counts this admission have been Results for CAILAH, REACH (MRN [id number]) as of 01/31/2016 16:01  Ref. Range 01/29/2016 02:29 01/29/2016 08:23 01/30/2016 03:30 01/31/2016 06:15  Platelets Latest Ref Range: 150 - 400 K/uL 148 (L) 77 (L) 81 (L) 85 (L)   Prior counts include Results for RYN, PEINE (MRN [id number]) as of 01/31/2016 16:01  Ref. Range 08/29/2007 19:41 08/30/2007 04:55 08/31/2007 05:20 10/03/2007 09:44 03/22/2009 09:59 01/16/2010 07:26  Platelets Latest Ref Range: 150 - 400 K/uL 100 (L) 100 (L) 104 (L) 205 120 (L) 122 (L)   Note her WBC count is WNL for an African American and she is not anemic"  Her subsequent history is as detailed below   INTERVAL HISTORY: Emiyah was evaluated in the hematology clinic 02/24/2015 accompanied by her son. After my initial consult with her in the hospital she underwent cardiac catheterization. This showed significant 3 vessel disease not amenable to stenting. She was referred to thoracic surgery and they noted the degree of calcification in the aorta would make it impossible for coronary artery bypass grafting. Accordingly the plan at  this point is for medical management and she is being treated with aspirin and clopidrogel  REVIEW OF SYSTEMS: Shailene is not having any bruising or bleeding symptoms and specifically denies epistaxis, bright red blood per rectum, melenic stools, or blood in the urine. She feels she is getting "back to normal", except that she is very easily fatigued and gets short of breath just going from one part of the house to the other. She is not getting out of the house much at all. She had an episode of mild chest pressure a few days ago and it was relieved with nitroglycerin. A detailed review of systems today was otherwise noncontributory  PAST MEDICAL HISTORY: Past Medical History:  Diagnosis Date  . Adrenal tumor 08/2007   surgery  . Cancer of right breast [facility])    s/p lumpectomy, XRT  . Carotid artery disease (HCC)    s/p bilateral CEA  . Hepatitis C    "got treatment for it" (01/29/2016)  . High cholesterol    takes Lopid daily  . History of UTI    takes Diflucan dail--pt states no uti in a couple of yrs though  . Hypertension    takes Losartan,Amlodipine,HCTZ,and Atenolol daily and Clatarpres  . Joint pain    legs  . Renal artery stenosis (HCC)    Right renal artery stent 05/31/06  . Stroke [facility]) Jan. 7, 2014  . Tobacco abuse   . Type II diabetes mellitus ([place])     PAST SURGICAL HISTORY: Past Surgical History:  Procedure Laterality Date  . ABDOMINAL HYSTERECTOMY    . ADRENAL GLAND SURGERY  2009  . BREAST LUMPECTOMY Right 2009  . CARDIAC CATHETERIZATION N/A 02/03/2016   Procedure: Right/Left Heart Cath and Coronary Angiography;  Surgeon: Burnell Blanks, MD;  Location: [place] [facility];  Service: Cardiovascular;  Laterality: N/A;  . ENDARTERECTOMY  01/05/2012   Procedure: ENDARTERECTOMY CAROTID;  Surgeon: Conrad [place], MD;  Location: [place];  Service: Vascular;  Laterality: Right;  Right Carotid Artery Endarterectomy, right stump pressure, intraoperative ultrasound  .  ENDARTERECTOMY  02/08/2012   Procedure: ENDARTERECTOMY CAROTID;  Surgeon: Conrad [place], MD;  Location: [place];  Service: Vascular;  Laterality: Left;  . LUMBAR DISC SURGERY    . PATCH ANGIOPLASTY  02/08/2012   Procedure: PATCH ANGIOPLASTY;  Surgeon: Conrad D[place] Y, MD;  Location: [facility] OR;  Service: Vascular;  Laterality: Left;  Carotid artery patch angioplasty using Vascu-Guard bovine patch 1cm x 6cm.  . RENAL ARTERY STENT Right 05/2006    FAMILY HISTORY Family History  Problem Relation Age of Onset  . Cancer Mother     ? type  . Cancer Father     ? type  . Diabetes Sister   . Hypertension Sister   . Diabetes Brother   . Hypertension Brother   . CAD Neg Hx   She knows little about her father. Her mother died about age 23 from heart problems. One brother, with liver and prostate probems, and 3 sisters, one deceased from a ruptured aneurysm.  GYNECOLOGIC HISTORY:  No LMP recorded. Patient has had a hysterectomy.   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 At home it's just her and her significant other Unk Pinto, who works in [employer]. The patient's son Richardine Peppers is a Cabin crew. The patient attends [employer]    ADVANCED DIRECTIVES:    HEALTH MAINTENANCE: Social History  Substance Use Topics  . Smoking status: Current Every Day Smoker    Packs/day: 0.50    Years: 53.00    Types: Cigarettes  . Smokeless tobacco: Never Used  . Alcohol use No       Allergies  Allergen Reactions  . Chlorhexidine Gluconate Itching  . Ace Inhibitors Cough  . Dilaudid [Hydromorphone Hcl] Itching    Current Outpatient Prescriptions  Medication Sig Dispense Refill  . amLODipine (NORVASC) 10 MG tablet Take 10 mg by mouth at bedtime.      Marland Kitchen aspirin EC 81 MG tablet Take 81 mg by mouth daily.      . cilostazol (PLETAL) 100 MG tablet Take 1 tablet (100 mg total) by mouth 2 (two) times daily before a meal. (Patient not taking: Reported on 01/29/2016) 60 tablet 11    . cloNIDine (CATAPRES) 0.1 MG tablet Take 0.1 mg by mouth 2 (two) times daily.      . clopidogrel (PLAVIX) 75 MG tablet Take 1 tablet (75 mg total) by mouth daily. 30 tablet 11  . furosemide (LASIX) 20 MG tablet Take 1 tablet (20 mg total) by mouth daily as needed for fluid or edema (For weight gain more than 3 pounds in one day). 30 tablet 0  . isosorbide mononitrate (IMDUR) 30 MG 24 hr tablet Take 1 tablet (30 mg total) by mouth daily. 60 tablet 0  . metoprolol succinate (TOPROL-XL) 25 MG 24 hr tablet Take 1 tablet (25 mg total) by mouth daily. 60 tablet 0  . nitroGLYCERIN (NITROSTAT) 0.4 MG SL tablet Place 1 tablet (0.4 mg total) under the tongue every 5 (five) minutes as needed for  chest pain. 60 tablet 0  . polyethylene glycol (MIRALAX / GLYCOLAX) packet Take 17 g by mouth daily as needed for mild constipation. 14 each 0  . PRODIGY NO CODING BLOOD GLUC test strip 3 (three) times daily.     Marland Kitchen PRODIGY TWIST TOP LANCETS 28G MISC 3 (three) times daily.     . simvastatin (ZOCOR) 40 MG tablet Take 40 mg by mouth daily.     . traMADol (ULTRAM) 50 MG tablet Take 1 tablet (50 mg total) by mouth every 6 (six) hours as needed. (Patient taking differently: Take 50 mg by mouth every 6 (six) hours as needed for moderate pain. ) 30 tablet 0   No current facility-administered medications for this visit.     OBJECTIVE: Middle-aged African-American woman who appears older than stated age 21:   02/24/16 1530  BP: (!) 133/55  Pulse: 87  Resp: 18  Temp: 98.1 F (36.7 C)     Body mass index is 22.52 kg/m.    ECOG FS:2 - Symptomatic, <50% confined to bed  Ocular: Sclerae unicteric, EOMs intact Ear-nose-throat: Oropharynx clear, slightly dry Lymphatic: No cervical or supraclavicular adenopathy Lungs no rales or rhonchi Heart regular rate and rhythm Abd soft, nontender, positive bowel sounds MSK no focal spinal tenderness, no joint edema Neuro: non-focal, well-oriented, appropriate  affect Breasts: Deferred   LAB RESULTS:  CMP     Component Value Date/Time   NA 141 02/04/2016 0229   K 4.0 02/04/2016 0229   CL 111 02/04/2016 0229   CO2 23 02/04/2016 0229   GLUCOSE 112 (H) 02/04/2016 0229   BUN 29 (H) 02/04/2016 0229   CREATININE 1.73 (H) 02/04/2016 0229   CREATININE 0.82 11/05/2011 1208   CALCIUM 10.0 02/04/2016 0229   PROT 6.3 (L) 01/30/2016 0330   ALBUMIN 3.2 (L) 01/30/2016 0330   AST 20 01/30/2016 0330   ALT 14 01/30/2016 0330   ALKPHOS 69 01/30/2016 0330   BILITOT 0.8 01/30/2016 0330   GFRNONAA 29 (L) 02/04/2016 0229   GFRAA 34 (L) 02/04/2016 0229    INo results found for: SPEP, UPEP  Lab Results  Component Value Date   WBC 4.9 02/24/2016   NEUTROABS 3.6 02/24/2016   HGB 10.7 (L) 02/24/2016   HCT 32.8 (L) 02/24/2016   MCV 89.1 02/24/2016   PLT 87 (L) 02/24/2016      Chemistry      Component Value Date/Time   NA 141 02/04/2016 0229   K 4.0 02/04/2016 0229   CL 111 02/04/2016 0229   CO2 23 02/04/2016 0229   BUN 29 (H) 02/04/2016 0229   CREATININE 1.73 (H) 02/04/2016 0229   CREATININE 0.82 11/05/2011 1208      Component Value Date/Time   CALCIUM 10.0 02/04/2016 0229   ALKPHOS 69 01/30/2016 0330   AST 20 01/30/2016 0330   ALT 14 01/30/2016 0330   BILITOT 0.8 01/30/2016 0330       No results found for: LABCA2  No components found for: [id number]  No results for input(s): INR in the last 168 hours.  Urinalysis    Component Value Date/Time   COLORURINE YELLOW 02/08/2012 1100   APPEARANCEUR CLOUDY (A) 02/08/2012 1100   LABSPEC 1.020 02/08/2012 1100   PHURINE 5.0 02/08/2012 1100   GLUCOSEU NEGATIVE 02/08/2012 1100   HGBUR TRACE (A) 02/08/2012 1100   BILIRUBINUR SMALL (A) 02/08/2012 1100   KETONESUR NEGATIVE 02/08/2012 1100   PROTEINUR >300 (A) 02/08/2012 1100   UROBILINOGEN 1.0 02/08/2012 1100   NITRITE  NEGATIVE 02/08/2012 1100   LEUKOCYTESUR NEGATIVE 02/08/2012 1100     STUDIES: Dg Chest Portable 1 View  Result  Date: 01/29/2016 CLINICAL DATA:  Shortness of breath EXAM: PORTABLE CHEST 1 VIEW COMPARISON:  Chest CT 08/16/2013 FINDINGS: There is atherosclerotic calcification in the aortic arch. Cardiomediastinal contours are otherwise normal. There is increased airspace opacity in the right lower lobe. No pleural effusion or pneumothorax. IMPRESSION: Increased opacity in the right lower lobe is concerning for pneumonia. Electronically Signed   By: Ulyses Jarred M.D.   On: 01/29/2016 02:50    ELIGIBLE FOR AVAILABLE RESEARCH PROTOCOL: no  ASSESSMENT: 71 y.o. [place] woman with moderate thrombocytopenia in setting of acute CHF (LVEF 25% with severe hypokinesis), with cardiac cath 02/03/2016 showing diffuse calcific three-vessel coronary artery disease not favorable for PCI, with concentric calcification of the aorta making CABG also not an option.  (1) Review of blood film showed no schistocytes. There are no obvious artefactual platelet clumps. There is no evidence of TTP/HUS or DIC clinically or by smear review. HIT is not going to be the cause as the patient was not on heparin on admission or on prior occasions when her platelet count has been low. I do not palpate an enlarged spleen and spleen was normal on CT abd 07/30/2013. Accordingly the working diagnosis on this case is chronic moderate immune thrombocytopenia   PLAN: I spent approximately 30 minutes today with Lachell and her son going over her the pathophysiology of platelet function. The understand platelets are or clotting cells. When there is an exposed surfaces inside of vessel or a wound anywhere platelets will go there and form a clot. More specifically, when there is injury to a heart or brain or kidney vessel the body may try to repair the injury by forming a clot. This will block the artery. The artery of course is what brings oxygen to the tissues show any tissue depending on that artery for oxygen will die.  The patient was able to  understand that she is at risk of heart attack and stroke on this basis. Accordingly her medications (aspirin, Plavix) are not allowing her platelets to function normally.  This is complicated by the fact that she does not have a normal platelet count. We discussed ITP, its pathophysiology, and its treatment, and she understands her immune system is attacking her own platelets and this accounts for the moderately low number. I emphasized that in general, so long as the platelet count remains above 50,000, we do not feel any intervention is needed. If we do need intervention (for example if significant surgery is anticipated--we can give her IVIG or any number of other agents which will reliably bring the platelet count temporarily to normal.  Ocie and her son understand that we are not as smart as the clotting system and that we can make mistakes both ways If we are too aggressive inhibiting the platelets she can have bruising or bleeding. If we are not aggressive enough she can have clotting problems. At present she is having absolutely no bruising or bleeding issues and I feel no particular intervention is needed as far as the platelet count itself is concerned  Normally I check platelet counts in patients with ITP on a regular basis perhaps every 3 months for the first year, but she is going to be seeing cardiology very frequently and will likely have lab work frequently as well. Accordingly I am making her a lab appointment here in 6 months and a  repeat visit with me in one year. Of course I will be glad to see her again at any point in the future if on when that would be helpful.  Chauncey Cruel, MD   02/24/2016 5:30 PM Medical Oncology and Hematology [facility] [address] [place], [place] [address] Tel. [phone]    Fax. [phone]

## 2016-02-25 DIAGNOSIS — N179 Acute kidney failure, unspecified: Secondary | ICD-10-CM | POA: Diagnosis not present

## 2016-02-25 DIAGNOSIS — N2581 Secondary hyperparathyroidism of renal origin: Secondary | ICD-10-CM | POA: Diagnosis not present

## 2016-02-25 DIAGNOSIS — R809 Proteinuria, unspecified: Secondary | ICD-10-CM | POA: Diagnosis not present

## 2016-02-25 DIAGNOSIS — I129 Hypertensive chronic kidney disease with stage 1 through stage 4 chronic kidney disease, or unspecified chronic kidney disease: Secondary | ICD-10-CM | POA: Diagnosis not present

## 2016-02-25 DIAGNOSIS — D631 Anemia in chronic kidney disease: Secondary | ICD-10-CM | POA: Diagnosis not present

## 2016-02-25 DIAGNOSIS — R3129 Other microscopic hematuria: Secondary | ICD-10-CM | POA: Diagnosis not present

## 2016-02-25 DIAGNOSIS — N183 Chronic kidney disease, stage 3 (moderate): Secondary | ICD-10-CM | POA: Diagnosis not present

## 2016-02-25 DIAGNOSIS — E118 Type 2 diabetes mellitus with unspecified complications: Secondary | ICD-10-CM | POA: Diagnosis not present

## 2016-02-26 ENCOUNTER — Encounter: Payer: Self-pay | Admitting: Cardiology

## 2016-02-26 ENCOUNTER — Ambulatory Visit (INDEPENDENT_AMBULATORY_CARE_PROVIDER_SITE_OTHER): Payer: Medicare HMO | Admitting: Cardiology

## 2016-02-26 VITALS — BP 140/60 | HR 92 | Ht 65.0 in | Wt 134.8 lb

## 2016-02-26 DIAGNOSIS — I2510 Atherosclerotic heart disease of native coronary artery without angina pectoris: Secondary | ICD-10-CM

## 2016-02-26 DIAGNOSIS — E785 Hyperlipidemia, unspecified: Secondary | ICD-10-CM | POA: Diagnosis not present

## 2016-02-26 HISTORY — DX: Atherosclerotic heart disease of native coronary artery without angina pectoris: I25.10

## 2016-02-26 NOTE — Progress Notes (Addendum)
02/26/2016 Kristen Bradley   [birth year]  [id number]  Primary Physician Cammy Copa, MD Primary Cardiologist: Dr. Angelena Form    Reason for Visit/CC: f/u for 3 Vessel CAD   HPI:  Kristen Bradley is a 71 y/o female, followed by Dr. Angelena Form, who presents to clinic for post hospital f/u and f/u for CAD and systolic HF. She recently underwent a LHC to evaluate for exertional dyspnea. She was found to have 3V CAD and systolic HF with EF of 40% and was evaluated by CT surgery for possible CABG. She was seen in consultation by Dr. Cyndia Bent. Unfortunately given the diffuse calcification of her aorta and severely calcified vessels, she would not be a candidate for bypass surgery. Percutaneous intervention would also be a very challenging option for her and would need to be performed with atherectomy. Dr. Angelena Form and Dr. Cyndia Bent had booth concluded that medical management would be best appropriate. Imdur was added to her regimen. She was continued on metoprolol and Plavix. Her metoprolol dose, however was reduced due to 2.2 sec pause on telemetry. No ACE/ARB given CKD.  PRN Lasix was added. It was noted in hospital notes, "If anginal symptoms worsen or become more worrisome, Dr. Angelena Form stated that we could possibly entertain complex high risk atherectomy PCI procedure".   She presents back to clinic for f/u. She continues to have mild exertional dyspnea and stable orthopnea, improved sleeping on 2 pillows,  but it is no worse than before. She denies associated CP. No resting dyspnea. No significant weight gain. No LEE. Her weight today, fully clothed in clinic is 134 lb. Her discharge weight was 130 lb. She does not have a scale at home. She has been watching her sodium intake. She reports full medication compliance and tolerating meds well w/o side effects.   Her son is with her in clinic today. He states that she was just seen by her nephrologist yesterday, Dr. Mercy Moore. He obtained labs to recheck her SCr.  He was going to determine if she could restart Losartan. Her son states they are waiting to hear back from his office regarding this.    Current Meds  Medication Sig  . amLODipine (NORVASC) 10 MG tablet Take 10 mg by mouth at bedtime.    Marland Kitchen aspirin EC 81 MG tablet Take 81 mg by mouth daily.    . cloNIDine (CATAPRES) 0.1 MG tablet Take 0.1 mg by mouth 2 (two) times daily.    . clopidogrel (PLAVIX) 75 MG tablet Take 1 tablet (75 mg total) by mouth daily.  . furosemide (LASIX) 20 MG tablet Take 1 tablet (20 mg total) by mouth daily as needed for fluid or edema (For weight gain more than 3 pounds in one day).  . isosorbide mononitrate (IMDUR) 30 MG 24 hr tablet Take 1 tablet (30 mg total) by mouth daily.  . metoprolol succinate (TOPROL-XL) 25 MG 24 hr tablet Take 1 tablet (25 mg total) by mouth daily.  . nitroGLYCERIN (NITROSTAT) 0.4 MG SL tablet Place 1 tablet (0.4 mg total) under the tongue every 5 (five) minutes as needed for chest pain.  . polyethylene glycol (MIRALAX / GLYCOLAX) packet Take 17 g by mouth daily as needed for mild constipation.  Marland Kitchen PRODIGY NO CODING BLOOD GLUC test strip 3 (three) times daily.   Marland Kitchen PRODIGY TWIST TOP LANCETS 28G MISC 3 (three) times daily.   . simvastatin (ZOCOR) 40 MG tablet Take 40 mg by mouth daily.    Allergies  Allergen Reactions  .  Chlorhexidine Gluconate Itching  . Ace Inhibitors Cough  . Dilaudid [Hydromorphone Hcl] Itching   Past Medical History:  Diagnosis Date  . Adrenal tumor 08/2007   surgery  . Cancer of right breast [facility])    s/p lumpectomy, XRT  . Carotid artery disease (HCC)    s/p bilateral CEA  . Hepatitis C    "got treatment for it" (01/29/2016)  . High cholesterol    takes Lopid daily  . History of UTI    takes Diflucan dail--pt states no uti in a couple of yrs though  . Hypertension    takes Losartan,Amlodipine,HCTZ,and Atenolol daily and Clatarpres  . Joint pain    legs  . Renal artery stenosis (HCC)    Right renal artery  stent 05/31/06  . Stroke [facility]) Jan. 7, 2014  . Tobacco abuse   . Type II diabetes mellitus (HCC)    Family History  Problem Relation Age of Onset  . Cancer Mother     ? type  . Cancer Father     ? type  . Diabetes Sister   . Hypertension Sister   . Diabetes Brother   . Hypertension Brother   . CAD Neg Hx    Past Surgical History:  Procedure Laterality Date  . ABDOMINAL HYSTERECTOMY    . ADRENAL GLAND SURGERY  2009  . BREAST LUMPECTOMY Right 2009  . CARDIAC CATHETERIZATION N/A 02/03/2016   Procedure: Right/Left Heart Cath and Coronary Angiography;  Surgeon: Burnell Blanks, MD;  Location: [facility];  Service: Cardiovascular;  Laterality: N/A;  . ENDARTERECTOMY  01/05/2012   Procedure: ENDARTERECTOMY CAROTID;  Surgeon: Conrad [place], MD;  Location: [place];  Service: Vascular;  Laterality: Right;  Right Carotid Artery Endarterectomy, right stump pressure, intraoperative ultrasound  . ENDARTERECTOMY  02/08/2012   Procedure: ENDARTERECTOMY CAROTID;  Surgeon: Conrad Milan, MD;  Location: [place];  Service: Vascular;  Laterality: Left;  . LUMBAR DISC SURGERY    . PATCH ANGIOPLASTY  02/08/2012   Procedure: PATCH ANGIOPLASTY;  Surgeon: Conrad [place], MD;  Location: [facility] OR;  Service: Vascular;  Laterality: Left;  Carotid artery patch angioplasty using Vascu-Guard bovine patch 1cm x 6cm.  . RENAL ARTERY STENT Right 05/2006   Social History   Social History  . Marital status: Divorced    Spouse name: N/A  . Number of children: 1  . Years of education: N/A   Occupational History  . Retired-truck driver Retired   Social History Main Topics  . Smoking status: Current Every Day Smoker    Packs/day: 0.50    Years: 53.00    Types: Cigarettes  . Smokeless tobacco: Never Used  . Alcohol use No  . Drug use: No  . Sexual activity: Not on file   Other Topics Concern  . Not on file   Social History Narrative  . No narrative on file     Review of Systems: General: negative for  chills, fever, night sweats or weight changes.  Cardiovascular: negative for chest pain, dyspnea on exertion, edema, orthopnea, palpitations, paroxysmal nocturnal dyspnea or shortness of breath Dermatological: negative for rash Respiratory: negative for cough or wheezing Urologic: negative for hematuria Abdominal: negative for nausea, vomiting, diarrhea, bright red blood per rectum, melena, or hematemesis Neurologic: negative for visual changes, syncope, or dizziness All other systems reviewed and are otherwise negative except as noted above.   Physical Exam:  Blood pressure 140/60, pulse 92, height 5\' 5"  (1.651 m), weight 134 lb  12.8 oz (61.1 kg).  General appearance: alert, cooperative and no distress Neck: no carotid bruit and no JVD Lungs: clear to auscultation bilaterally Heart: regular rate and rhythm, S1, S2 normal, no murmur, click, rub or gallop Extremities: extremities normal, atraumatic, no cyanosis or edema Pulses: 2+ and symmetric Skin: Skin color, texture, turgor normal. No rashes or lesions Neurologic: Grossly normal  EKG not performed   ASSESSMENT AND PLAN:   1. CAD: recent LHC showed severe calcification in all three vessels. Her anatomy is not favorable for PCI. Not a candidate for CABG given diffuse calcification of her aorta and severely calcified vessels. Medical therapy elected as initial therapy. If she has recurrent angina despite maximal medical therapy, Dr. Angelena Form stated that we could possibly entertain complex high risk atherectomy PCI procedure. --- She denies any CP. She still notes mild exertional dyspnea but this may be related to her chronic HF. Not any worse since discharge. No resting dyspnea. She reports full compliance with ASA, Plavix, metoprolol, Imdur and Simvastatin. BP is controlled. We will check a FLP today to assess baseline LDL and will change simvastatin to Lipitor if LDL is not at goal of < 70 mg/dL. She has PRN SL NTG at home but has not had  to use it thus far. We reviewed proper use of SL NTG.   2. Systolic HF/ Ischemic Cardiomyopathy: EF 25%. She is euvolemic on physical exam. Her weight has remained stable since discharge. On BB, however her metoprolol was decreased during recent hospitalization given 2.2 sec pause on telemetry. Currently not on an ACE/ARB give CKD. However, she was just seen by her nephrologist yesterday, Dr. Mercy Moore. He obtained labs to recheck her SCr. He was going to determine if she could restart Losartan. Her son states they are waiting to hear back from his office regarding this. If approved to restart, treatment with an ARB will be beneficial for her HF. She has been instructed to notify our office regarding nephrology's decision. If she is not able to restart Losartan, I would recommend the addition of hydralazine for afterload reduction, starting at 10 mg TID.  Continue Imdur. PRN Lasix. We discussed importance of daily weights. Low sodium dieta advised.   3. CKD: PRN lasix. Per above. Dr. Mercy Moore to notify patient if Losartan can be resumed.   4. HLD: currently on simvastatin but has 3V CAD. No recent lipid panel on file. We will check lipids today. If LDL not < 70 mg/dL, we will change statin to Lipitor.    PLAN  F/u with Dr. Angelena Form in 3-4 weeks.   Kristen Jester PA-C 02/26/2016 10:56 AM

## 2016-02-26 NOTE — Patient Instructions (Addendum)
Medication Instructions:   IF YOU GAIN 3LBS IN 24 HOURS OR  GAIN FIVE POUNDS IN A WEEK  MAKE SURE YOU  TAKE YOUR LASIX  AS NEEDED     If you need a refill on your cardiac medications before your next appointment, please call your pharmacy.  Labwork:  LIPIDS TODAY     Testing/Procedures:  NONE ORDERED  TODAY    Follow-Up:  IN 4 TO 6 WEEKS  WITH [facility]   Any Other Special Instructions Will Be Listed Below (If Applicable).  YOU HAVE BEEN  RECOMMEND TO  WATCH YOUR SODIUM INTAKE .Marland Kitchen    Heart Failure Heart failure means your heart has trouble pumping blood. This makes it hard for your body to work well. Heart failure is usually a long-term (chronic) condition. You must take good care of yourself and follow your doctor's treatment plan. HOME CARE  Take your heart medicine as told by your doctor.  Do not stop taking medicine unless your doctor tells you to.  Do not skip any dose of medicine.  Refill your medicines before they run out.  Take other medicines only as told by your doctor or pharmacist.  Stay active if told by your doctor. The elderly and people with severe heart failure should talk with a doctor about physical activity.  Eat heart-healthy foods. Choose foods that are without trans fat and are low in saturated fat, cholesterol, and salt (sodium). This includes fresh or frozen fruits and vegetables, fish, lean meats, fat-free or low-fat dairy foods, whole grains, and high-fiber foods. Lentils and dried peas and beans (legumes) are also good choices.  Limit salt if told by your doctor.  Cook in a healthy way. Roast, grill, broil, bake, poach, steam, or stir-fry foods.  Limit fluids as told by your doctor.  Weigh yourself every morning. Do this after you pee (urinate) and before you eat breakfast. Write down your weight to give to your doctor.  Take your blood pressure and write it down if your doctor tells you to.  Ask your doctor how to check your pulse.  Check your pulse as told.  Lose weight if told by your doctor.  Stop smoking or chewing tobacco. Do not use gum or patches that help you quit without your doctor's approval.  Schedule and go to doctor visits as told.  Nonpregnant women should have no more than 1 drink a day. Men should have no more than 2 drinks a day. Talk to your doctor about drinking alcohol.  Stop illegal drug use.  Stay current with shots (immunizations).  Manage your health conditions as told by your doctor.  Learn to manage your stress.  Rest when you are tired.  If it is really hot outside:  Avoid intense activities.  Use air conditioning or fans, or get in a cooler place.  Avoid caffeine and alcohol.  Wear loose-fitting, lightweight, and light-colored clothing.  If it is really cold outside:  Avoid intense activities.  Layer your clothing.  Wear mittens or gloves, a hat, and a scarf when going outside.  Avoid alcohol.  Learn about heart failure and get support as needed.  Get help to maintain or improve your quality of life and your ability to care for yourself as needed. GET HELP IF:   You gain weight quickly.  You are more short of breath than usual.  You cannot do your normal activities.  You tire easily.  You cough more than normal, especially with activity.  You  have any or more puffiness (swelling) in areas such as your hands, feet, ankles, or belly (abdomen).  You cannot sleep because it is hard to breathe.  You feel like your heart is beating fast (palpitations).  You get dizzy or light-headed when you stand up. GET HELP RIGHT AWAY IF:   You have trouble breathing.  There is a change in mental status, such as becoming less alert or not being able to focus.  You have chest pain or discomfort.  You faint. MAKE SURE YOU:   Understand these instructions.  Will watch your condition.  Will get help right away if you are not doing well or get worse. This  information is not intended to replace advice given to you by your health care provider. Make sure you discuss any questions you have with your health care provider. Document Released: 10/28/2007 Document Revised: 02/08/2014 Document Reviewed: 03/06/2012 Elsevier Interactive Patient Education  2017 [employer].

## 2016-02-27 LAB — LIPID PANEL
CHOLESTEROL TOTAL: 163 mg/dL (ref 100–199)
Chol/HDL Ratio: 4.3 ratio units (ref 0.0–4.4)
HDL: 38 mg/dL — ABNORMAL LOW (ref >39)
LDL Calculated: 98 mg/dL (ref 0–99)
Triglycerides: 137 mg/dL (ref 0–149)
VLDL CHOLESTEROL CAL: 27 mg/dL (ref 5–40)

## 2016-03-02 ENCOUNTER — Other Ambulatory Visit: Payer: Self-pay | Admitting: *Deleted

## 2016-03-02 ENCOUNTER — Telehealth: Payer: Self-pay | Admitting: *Deleted

## 2016-03-02 DIAGNOSIS — D509 Iron deficiency anemia, unspecified: Secondary | ICD-10-CM | POA: Diagnosis not present

## 2016-03-02 DIAGNOSIS — E785 Hyperlipidemia, unspecified: Secondary | ICD-10-CM

## 2016-03-02 MED ORDER — ATORVASTATIN CALCIUM 80 MG PO TABS
80.0000 mg | ORAL_TABLET | Freq: Every day | ORAL | 3 refills | Status: DC
Start: 2016-03-02 — End: 2016-03-02

## 2016-03-02 MED ORDER — NITROGLYCERIN 0.4 MG SL SUBL: 0.4000 mg | SUBLINGUAL_TABLET | SUBLINGUAL | 3 refills | Status: AC | PRN

## 2016-03-02 MED ORDER — ATORVASTATIN CALCIUM 80 MG PO TABS: 80.0000 mg | ORAL_TABLET | Freq: Every day | ORAL | 3 refills | Status: DC

## 2016-03-02 MED ORDER — NITROGLYCERIN 0.4 MG SL SUBL: 0.4000 mg | SUBLINGUAL_TABLET | SUBLINGUAL | 3 refills | Status: DC | PRN

## 2016-03-02 NOTE — Telephone Encounter (Signed)
Pt has been made aware of her lab results. She has been made aware that she needs to d/c Simvastatin and start Lipitor 80 mg nightly. She will return for lab work 04/13/16  Med change and order for labs put in EPIC. Pt verbalized understanding.

## 2016-03-02 NOTE — Telephone Encounter (Signed)
-----   Message from Consuelo Pandy, [place] sent at 02/27/2016  3:48 PM EST ----- LDL is not at goal with simvastatin. LDL given CAD should be less than 70. Her level is 98. Stop simvastatin and start Lipitor 80 mg nightly. Recheck FLP and HFTs in 6 weeks.

## 2016-03-02 NOTE — Telephone Encounter (Signed)
I had spoke with patient earlier today and sent an rx for nitro to her mail order pharmacy as requested. She has called and cancelled this and would like for the rx to be sent to [place].

## 2016-03-03 ENCOUNTER — Telehealth: Payer: Self-pay | Admitting: Cardiovascular Disease

## 2016-03-03 ENCOUNTER — Inpatient Hospital Stay (HOSPITAL_COMMUNITY)
Admission: AD | Admit: 2016-03-03 | Discharge: 2016-03-05 | DRG: 291 | Disposition: A | Discharge status: Home / Self Care | Payer: Medicare HMO | Source: Ambulatory Visit | Attending: Cardiovascular Disease | Admitting: Cardiovascular Disease

## 2016-03-03 DIAGNOSIS — I519 Heart disease, unspecified: Secondary | ICD-10-CM | POA: Diagnosis not present

## 2016-03-03 DIAGNOSIS — I5043 Acute on chronic combined systolic (congestive) and diastolic (congestive) heart failure: Secondary | ICD-10-CM | POA: Diagnosis present

## 2016-03-03 DIAGNOSIS — I701 Atherosclerosis of renal artery: Secondary | ICD-10-CM | POA: Diagnosis present

## 2016-03-03 DIAGNOSIS — N183 Chronic kidney disease, stage 3 (moderate): Secondary | ICD-10-CM | POA: Diagnosis not present

## 2016-03-03 DIAGNOSIS — R0601 Orthopnea: Secondary | ICD-10-CM | POA: Diagnosis not present

## 2016-03-03 DIAGNOSIS — N179 Acute kidney failure, unspecified: Secondary | ICD-10-CM | POA: Diagnosis not present

## 2016-03-03 DIAGNOSIS — E1129 Type 2 diabetes mellitus with other diabetic kidney complication: Secondary | ICD-10-CM | POA: Diagnosis not present

## 2016-03-03 DIAGNOSIS — E7800 Pure hypercholesterolemia, unspecified: Secondary | ICD-10-CM | POA: Diagnosis present

## 2016-03-03 DIAGNOSIS — N184 Chronic kidney disease, stage 4 (severe): Secondary | ICD-10-CM | POA: Diagnosis present

## 2016-03-03 DIAGNOSIS — E1122 Type 2 diabetes mellitus with diabetic chronic kidney disease: Secondary | ICD-10-CM | POA: Diagnosis not present

## 2016-03-03 DIAGNOSIS — I129 Hypertensive chronic kidney disease with stage 1 through stage 4 chronic kidney disease, or unspecified chronic kidney disease: Secondary | ICD-10-CM | POA: Diagnosis not present

## 2016-03-03 DIAGNOSIS — I10 Essential (primary) hypertension: Secondary | ICD-10-CM

## 2016-03-03 DIAGNOSIS — I130 Hypertensive heart and chronic kidney disease with heart failure and stage 1 through stage 4 chronic kidney disease, or unspecified chronic kidney disease: Secondary | ICD-10-CM | POA: Diagnosis not present

## 2016-03-03 DIAGNOSIS — D631 Anemia in chronic kidney disease: Secondary | ICD-10-CM | POA: Diagnosis present

## 2016-03-03 DIAGNOSIS — I2510 Atherosclerotic heart disease of native coronary artery without angina pectoris: Secondary | ICD-10-CM | POA: Diagnosis not present

## 2016-03-03 DIAGNOSIS — Z7902 Long term (current) use of antithrombotics/antiplatelets: Secondary | ICD-10-CM | POA: Diagnosis not present

## 2016-03-03 DIAGNOSIS — I5023 Acute on chronic systolic (congestive) heart failure: Secondary | ICD-10-CM | POA: Diagnosis not present

## 2016-03-03 DIAGNOSIS — F17210 Nicotine dependence, cigarettes, uncomplicated: Secondary | ICD-10-CM | POA: Diagnosis present

## 2016-03-03 DIAGNOSIS — Z8249 Family history of ischemic heart disease and other diseases of the circulatory system: Secondary | ICD-10-CM

## 2016-03-03 DIAGNOSIS — I429 Cardiomyopathy, unspecified: Secondary | ICD-10-CM

## 2016-03-03 DIAGNOSIS — Z79899 Other long term (current) drug therapy: Secondary | ICD-10-CM | POA: Diagnosis not present

## 2016-03-03 DIAGNOSIS — R0600 Dyspnea, unspecified: Secondary | ICD-10-CM

## 2016-03-03 DIAGNOSIS — I255 Ischemic cardiomyopathy: Secondary | ICD-10-CM | POA: Diagnosis not present

## 2016-03-03 DIAGNOSIS — R0609 Other forms of dyspnea: Secondary | ICD-10-CM

## 2016-03-03 DIAGNOSIS — Z7982 Long term (current) use of aspirin: Secondary | ICD-10-CM

## 2016-03-03 DIAGNOSIS — I25118 Atherosclerotic heart disease of native coronary artery with other forms of angina pectoris: Secondary | ICD-10-CM

## 2016-03-03 DIAGNOSIS — B1920 Unspecified viral hepatitis C without hepatic coma: Secondary | ICD-10-CM | POA: Diagnosis present

## 2016-03-03 DIAGNOSIS — Z853 Personal history of malignant neoplasm of breast: Secondary | ICD-10-CM

## 2016-03-03 DIAGNOSIS — Z8673 Personal history of transient ischemic attack (TIA), and cerebral infarction without residual deficits: Secondary | ICD-10-CM

## 2016-03-03 DIAGNOSIS — Z833 Family history of diabetes mellitus: Secondary | ICD-10-CM | POA: Diagnosis not present

## 2016-03-03 DIAGNOSIS — E8770 Fluid overload, unspecified: Secondary | ICD-10-CM | POA: Diagnosis not present

## 2016-03-03 DIAGNOSIS — R0602 Shortness of breath: Secondary | ICD-10-CM | POA: Diagnosis not present

## 2016-03-03 DIAGNOSIS — I5022 Chronic systolic (congestive) heart failure: Secondary | ICD-10-CM | POA: Diagnosis present

## 2016-03-03 HISTORY — DX: Chronic systolic (congestive) heart failure: I50.22

## 2016-03-03 LAB — CBC WITH DIFFERENTIAL/PLATELET
BASOS ABS: 0 10*3/uL (ref 0.0–0.1)
Basophils Relative: 1 %
EOS ABS: 0 10*3/uL (ref 0.0–0.7)
EOS PCT: 0 %
HCT: 30.4 % — ABNORMAL LOW (ref 36.0–46.0)
Hemoglobin: 9.8 g/dL — ABNORMAL LOW (ref 12.0–15.0)
Lymphocytes Relative: 26 %
Lymphs Abs: 1 10*3/uL (ref 0.7–4.0)
MCH: 28.9 pg (ref 26.0–34.0)
MCHC: 32.2 g/dL (ref 30.0–36.0)
MCV: 89.7 fL (ref 78.0–100.0)
Monocytes Absolute: 0.4 10*3/uL (ref 0.1–1.0)
Monocytes Relative: 9 %
Neutro Abs: 2.5 10*3/uL (ref 1.7–7.7)
Neutrophils Relative %: 64 %
PLATELETS: 121 10*3/uL — AB (ref 150–400)
RBC: 3.39 MIL/uL — AB (ref 3.87–5.11)
RDW: 15.2 % (ref 11.5–15.5)
WBC: 3.9 10*3/uL — AB (ref 4.0–10.5)

## 2016-03-03 LAB — BASIC METABOLIC PANEL
Anion gap: 10 (ref 5–15)
BUN: 25 mg/dL — AB (ref 6–20)
CALCIUM: 10.5 mg/dL — AB (ref 8.9–10.3)
CO2: 23 mmol/L (ref 22–32)
CREATININE: 2 mg/dL — AB (ref 0.44–1.00)
Chloride: 109 mmol/L (ref 101–111)
GFR calc Af Amer: 28 mL/min — ABNORMAL LOW (ref >60)
GFR, EST NON AFRICAN AMERICAN: 24 mL/min — AB (ref >60)
Glucose, Bld: 137 mg/dL — ABNORMAL HIGH (ref 65–99)
POTASSIUM: 4 mmol/L (ref 3.5–5.1)
SODIUM: 142 mmol/L (ref 135–145)

## 2016-03-03 LAB — GLUCOSE, CAPILLARY: GLUCOSE-CAPILLARY: 147 mg/dL — AB (ref 65–99)

## 2016-03-03 LAB — BRAIN NATRIURETIC PEPTIDE: B NATRIURETIC PEPTIDE 5: 3214.8 pg/mL — AB (ref 0.0–100.0)

## 2016-03-03 MED ORDER — ACETAMINOPHEN 325 MG PO TABS: 650.0000 mg | ORAL_TABLET | ORAL | Status: DC | PRN

## 2016-03-03 MED ORDER — ONDANSETRON HCL 4 MG/2ML IJ SOLN
4.0000 mg | Freq: Four times a day (QID) | INTRAMUSCULAR | Status: DC | PRN
Start: 2016-03-03 — End: 2016-03-05

## 2016-03-03 MED ORDER — CLONIDINE HCL 0.1 MG PO TABS
0.1000 mg | ORAL_TABLET | Freq: Two times a day (BID) | ORAL | Status: DC
  Administered 2016-03-03 – 2016-03-04 (×3): 0.1 mg via ORAL
  Filled 2016-03-03 (×3): qty 1

## 2016-03-03 MED ORDER — SODIUM CHLORIDE 0.9 % IV SOLN: 250.0000 mL | INTRAVENOUS | Status: DC | PRN

## 2016-03-03 MED ORDER — ISOSORBIDE MONONITRATE ER 30 MG PO TB24
30.0000 mg | ORAL_TABLET | Freq: Every day | ORAL | Status: DC
  Administered 2016-03-04 – 2016-03-05 (×2): 30 mg via ORAL
  Filled 2016-03-03 (×2): qty 1

## 2016-03-03 MED ORDER — CLOPIDOGREL BISULFATE 75 MG PO TABS
75.0000 mg | ORAL_TABLET | Freq: Every day | ORAL | Status: DC
  Administered 2016-03-04 – 2016-03-05 (×2): 75 mg via ORAL
  Filled 2016-03-03 (×2): qty 1

## 2016-03-03 MED ORDER — FUROSEMIDE 10 MG/ML IJ SOLN
40.0000 mg | Freq: Once | INTRAMUSCULAR | Status: AC
  Administered 2016-03-03: 40 mg via INTRAVENOUS
  Filled 2016-03-03: qty 4

## 2016-03-03 MED ORDER — METOPROLOL SUCCINATE ER 25 MG PO TB24
25.0000 mg | ORAL_TABLET | Freq: Every day | ORAL | Status: DC
  Administered 2016-03-04 – 2016-03-05 (×2): 25 mg via ORAL
  Filled 2016-03-03 (×2): qty 1

## 2016-03-03 MED ORDER — AMLODIPINE BESYLATE 10 MG PO TABS
10.0000 mg | ORAL_TABLET | Freq: Every day | ORAL | Status: DC
Start: 2016-03-04 — End: 2016-03-05
  Administered 2016-03-04: 10 mg via ORAL
  Filled 2016-03-03: qty 1

## 2016-03-03 MED ORDER — ASPIRIN EC 81 MG PO TBEC
81.0000 mg | DELAYED_RELEASE_TABLET | Freq: Every day | ORAL | Status: DC
  Administered 2016-03-04 – 2016-03-05 (×2): 81 mg via ORAL
  Filled 2016-03-03 (×2): qty 1

## 2016-03-03 MED ORDER — FUROSEMIDE 10 MG/ML IJ SOLN
40.0000 mg | Freq: Two times a day (BID) | INTRAMUSCULAR | Status: DC
  Administered 2016-03-04 (×2): 40 mg via INTRAVENOUS
  Filled 2016-03-03 (×2): qty 4

## 2016-03-03 MED ORDER — SODIUM CHLORIDE 0.9% FLUSH
3.0000 mL | Freq: Two times a day (BID) | INTRAVENOUS | Status: DC
  Administered 2016-03-03 – 2016-03-05 (×4): 3 mL via INTRAVENOUS

## 2016-03-03 MED ORDER — SODIUM CHLORIDE 0.9% FLUSH: 3.0000 mL | INTRAVENOUS | Status: DC | PRN

## 2016-03-03 MED ORDER — ATORVASTATIN CALCIUM 80 MG PO TABS
80.0000 mg | ORAL_TABLET | Freq: Every day | ORAL | Status: DC
  Administered 2016-03-04: 80 mg via ORAL
  Filled 2016-03-03: qty 1

## 2016-03-03 MED ORDER — NITROGLYCERIN 0.4 MG SL SUBL: 0.4000 mg | SUBLINGUAL_TABLET | SUBLINGUAL | Status: DC | PRN

## 2016-03-03 MED ORDER — HEPARIN SODIUM (PORCINE) 5000 UNIT/ML IJ SOLN
5000.0000 [IU] | Freq: Three times a day (TID) | INTRAMUSCULAR | Status: DC
  Administered 2016-03-03 – 2016-03-05 (×5): 5000 [IU] via SUBCUTANEOUS
  Filled 2016-03-03 (×4): qty 1

## 2016-03-03 NOTE — Telephone Encounter (Signed)
I spoke with pt. She reports shortness of breath since hospitalization.  Has not changed since office visit here on 02/26/16.  She reports shortness of breath especially  when lying down. Also occurs sometimes when moving around.  Occasional ache in chest with shortness of breath. No swelling. Weight staying 133 lbs.  Uses Lasix as needed for weight gain or swelling.  Last took about a week ago.  Has not heard from nephrology regarding any medicine changes. Will forward to Dr. Angelena Form for review

## 2016-03-03 NOTE — Telephone Encounter (Signed)
New message    Patient calling     Pt c/o Shortness Of Breath: STAT if SOB developed within the last 24 hours or pt is noticeably SOB on the phone  1. Are you currently SOB (can you hear that pt is SOB on the phone)? No   2. How long have you been experiencing SOB? Ever since getting out of hospital . D/c Jan  3. Are you SOB when sitting or when up moving around? Laying down    4. Are you currently experiencing any other symptoms? No

## 2016-03-03 NOTE — Telephone Encounter (Signed)
Let's get her most recent BMET from Nephrology office. If creatinine is stable, we may have her take daily lasix to see if this helps her dyspnea. She has severe CAD and severe LV systolic dysfunction. Not a candidate for CABG. PCI will be complex. I will review her films and formulate a plan over the next few days. If she has more dyspnea, she should call 911 or go to the ED. cdm

## 2016-03-03 NOTE — H&P (Addendum)
History & Physical    Patient ID: Kristen Bradley MRN: [id number], DOB/AGE: 1945/06/03   Admit date: 03/03/2016   Primary Physician: Cammy Copa, MD Primary Cardiologist: Dr. Angelena Form   History of Present Illness    Kristen Bradley is a 71 y.o. female with past medical history of DM type II, Hepatitis C, HTN and stroke (2014), renal artery stenosis with stent in 2008, HLD, S/P bilateral CEA, who presents as a direct admit for shortness of breath and orthopnea.  Kristen Bradley has severe CAD. Turned down for CABG. We attempted medical management. She also has an ischemic cardiomyopathy and stage 3 chronic kidney disease. She is having worsened dyspnea with minimal exertion. Cannot attempt diuresis at home given her CKD.  We can check renal function and attempt diuresis with Nephrology involvement. She is followed by DR. Mercy Moore as an outpatient. Dr. Angelena Form suggests to consider atherectomy of the LAD and RCA and stenting once renal issues are stable.   Pt usually takes lasix on an as needed basis for increased weight. She has not gained any weight, so she has not taken any lately. She has had DOE and significant orthopnea so taht she has had very poor sleep. She has no edema and lungs are clear.   Past Medical History    Past Medical History:  Diagnosis Date  . Adrenal tumor 08/2007   surgery  . Cancer of right breast [facility])    s/p lumpectomy, XRT  . Carotid artery disease (HCC)    s/p bilateral CEA  . Hepatitis C    "got treatment for it" (01/29/2016)  . High cholesterol    takes Lopid daily  . History of UTI    takes Diflucan dail--pt states no uti in a couple of yrs though  . Hypertension    takes Losartan,Amlodipine,HCTZ,and Atenolol daily and Clatarpres  . Joint pain    legs  . Renal artery stenosis (HCC)    Right renal artery stent 05/31/06  . Stroke [facility]) Jan. 7, 2014  . Tobacco abuse   . Type II diabetes mellitus ([place])     Past Surgical History:  Procedure  Laterality Date  . ABDOMINAL HYSTERECTOMY    . ADRENAL GLAND SURGERY  2009  . BREAST LUMPECTOMY Right 2009  . CARDIAC CATHETERIZATION N/A 02/03/2016   Procedure: Right/Left Heart Cath and Coronary Angiography;  Surgeon: Burnell Blanks, MD;  Location: [place] [facility];  Service: Cardiovascular;  Laterality: N/A;  . ENDARTERECTOMY  01/05/2012   Procedure: ENDARTERECTOMY CAROTID;  Surgeon: Conrad Kerrtown, MD;  Location: [place];  Service: Vascular;  Laterality: Right;  Right Carotid Artery Endarterectomy, right stump pressure, intraoperative ultrasound  . ENDARTERECTOMY  02/08/2012   Procedure: ENDARTERECTOMY CAROTID;  Surgeon: Conrad Reading, MD;  Location: [place];  Service: Vascular;  Laterality: Left;  . LUMBAR DISC SURGERY    . PATCH ANGIOPLASTY  02/08/2012   Procedure: PATCH ANGIOPLASTY;  Surgeon: Conrad Richburg, MD;  Location: [facility] OR;  Service: Vascular;  Laterality: Left;  Carotid artery patch angioplasty using Vascu-Guard bovine patch 1cm x 6cm.  . RENAL ARTERY STENT Right 05/2006     Allergies  Allergies  Allergen Reactions  . Chlorhexidine Gluconate Itching  . Ace Inhibitors Cough  . Dilaudid [Hydromorphone Hcl] Itching     Home Medications    Prior to Admission medications   Medication Sig Start Date End Date Taking? Authorizing Provider  amLODipine (NORVASC) 10 MG tablet Take 10 mg by mouth  at bedtime.      Historical Provider, MD  aspirin EC 81 MG tablet Take 81 mg by mouth daily.      Historical Provider, MD  atorvastatin (LIPITOR) 80 MG tablet Take 1 tablet (80 mg total) by mouth daily. 03/02/16 05/31/16  Brittainy M Simmons, PA-C  cloNIDine (CATAPRES) 0.1 MG tablet Take 0.1 mg by mouth 2 (two) times daily.      Historical Provider, MD  clopidogrel (PLAVIX) 75 MG tablet Take 1 tablet (75 mg total) by mouth daily. 01/05/12   Regina J Roczniak, PA-C  furosemide (LASIX) 20 MG tablet Take 1 tablet (20 mg total) by mouth daily as needed for fluid or edema (For weight gain more than 3  pounds in one day). 02/04/16   Lavina Hamman, MD  isosorbide mononitrate (IMDUR) 30 MG 24 hr tablet Take 1 tablet (30 mg total) by mouth daily. 02/04/16   Lavina Hamman, MD  metoprolol succinate (TOPROL-XL) 25 MG 24 hr tablet Take 1 tablet (25 mg total) by mouth daily. 02/05/16   Lavina Hamman, MD  nitroGLYCERIN (NITROSTAT) 0.4 MG SL tablet Place 1 tablet (0.4 mg total) under the tongue every 5 (five) minutes as needed for chest pain. 03/02/16   Burnell Blanks, MD  polyethylene glycol (MIRALAX / Floria Raveling) packet Take 17 g by mouth daily as needed for mild constipation. 02/04/16   Lavina Hamman, MD  PRODIGY NO CODING BLOOD GLUC test strip 3 (three) times daily.  03/08/12   Historical Provider, MD  PRODIGY TWIST TOP LANCETS 28G MISC 3 (three) times daily.  12/17/11   Historical Provider, MD    Family History    Family History  Problem Relation Age of Onset  . Cancer Mother     ? type  . Cancer Father     ? type  . Diabetes Sister   . Hypertension Sister   . Diabetes Brother   . Hypertension Brother   . CAD Neg Hx     Social History    Social History   Social History  . Marital status: Divorced    Spouse name: N/A  . Number of children: 1  . Years of education: N/A   Occupational History  . Retired-truck driver Retired   Social History Main Topics  . Smoking status: Current Every Day Smoker    Packs/day: 0.50    Years: 53.00    Types: Cigarettes  . Smokeless tobacco: Never Used  . Alcohol use No  . Drug use: No  . Sexual activity: Not on file   Other Topics Concern  . Not on file   Social History Narrative  . No narrative on file     Review of Systems   General:  No chills, fever, night sweats or weight changes.  Cardiovascular:  No chest pain, edema, or palpitations, Positive for DOE and orthopnea, Dermatological: No rash, lesions/masses Respiratory: No cough, DOE Urologic: No hematuria, dysuria Abdominal:   No nausea, vomiting, diarrhea, bright red blood  per rectum, melena, or hematemesis Neurologic:  No visual changes, wkns, changes in mental status. All other systems reviewed and are otherwise negative except as noted above.  Physical Exam   Blood pressure (!) 140/55, pulse 81, temperature 98.6 F (37 C), temperature source Oral, resp. rate 18, height 5\' 5"  (1.651 m), weight 130 lb 9.6 oz (59.2 kg), SpO2 100 %.  General: Tin AA ,female in no acute distress. Head: Normocephalic, atraumatic, sclera non-icteric, no xanthomas, nares are without  discharge.  Neck: No carotid bruits. JVD not elevated. Surgical scars Lungs: Respirations regular and unlabored, without wheezes or rales.  Heart: Regular rate and rhythm. No S3 or S4.  No murmur, no rubs, or gallops appreciated. Abdomen: Soft, non-tender, non-distended with normoactive bowel sounds. No hepatomegaly. No rebound/guarding. No obvious abdominal masses. Msk:  Strength and tone appear normal for age. No joint deformities or effusions. Extremities: No clubbing or cyanosis. No edema.  Distal pedal pulses are 2+ bilaterally. Neuro: Alert and oriented X 3. Moves all extremities spontaneously. No focal deficits noted. Psych:  Responds to questions appropriately with a normal affect. Skin: No rashes or lesions noted  Labs    Troponin (Point of Care Test) No results for input(s): TROPIPOC in the last 72 hours. No results for input(s): CKTOTAL, CKMB, TROPONINI in the last 72 hours. Lab Results  Component Value Date   WBC 4.9 02/24/2016   HGB 10.7 (L) 02/24/2016   HCT 32.8 (L) 02/24/2016   MCV 89.1 02/24/2016   PLT 87 (L) 02/24/2016   No results for input(s): NA, K, CL, CO2, BUN, CREATININE, CALCIUM, PROT, BILITOT, ALKPHOS, ALT, AST, GLUCOSE in the last 168 hours.  Invalid input(s): LABALBU Lab Results  Component Value Date   CHOL 163 02/26/2016   HDL 38 (L) 02/26/2016   LDLCALC 98 02/26/2016   TRIG 137 02/26/2016   Lab Results  Component Value Date   DDIMER 1.73 (H) 01/30/2016      B Natriuretic Peptide  Date/Time Value Ref Range Status  01/29/2016 03:47 AM 1,091.5 (H) 0.0 - 100.0 pg/mL Final   No results found for: PROBNP No results for input(s): INR in the last 72 hours.    Radiology Studies    No results found.  EKG & Cardiac Imaging    EKG: Pending   ECHOCARDIOGRAM: Echo 01/30/2016  Study Conclusions - Left ventricle: The cavity size was normal. Wall thickness was   increased in a pattern of mild LVH. The estimated ejection   fraction was 25%. Diffuse severe hypokinesis with septal-lateral   dyssynchrony. Doppler parameters are consistent with restrictive   physiology, indicative of decreased left ventricular diastolic   compliance and/or increased left atrial pressure. - Aortic valve: There was no stenosis. - Mitral valve: There was mild to moderate regurgitation. - Left atrium: The atrium was moderately dilated. - Right ventricle: The cavity size was normal. Systolic function   was mildly reduced. - Tricuspid valve: Peak RV-RA gradient (S): 44 mm Hg. - Pulmonary arteries: PA peak pressure: 59 mm Hg (S). - Systemic veins: IVC measured 2.1 cm with < 50% respirophasic   variation, suggesting RA pressure 15 mmHg. - Pericardium, extracardiac: A trivial pericardial effusion was   identified posterior to the heart.  Impressions: - Normal LV size with mild LV hypertrophy. EF 25% with severe   diffuse hypokinesis and septal-lateral dyssynchrony. Restrictive   diastolic function. Normal RV size with mildly decreased systolic   function. Moderate pulmonary hypertension. Mild to moderate MR.   Dilated IVC suggestive of elevated RV filling pressure. _______________________________________________________________________________  Right and left heart cath 02/03/2016   Ost RCA to Prox RCA lesion, 60 %stenosed.  Mid RCA lesion, 80 %stenosed.  Dist RCA lesion, 60 %stenosed.  RPDA lesion, 99 %stenosed.  Ost LM lesion, 50 %stenosed.  Ost Cx  to Prox Cx lesion, 70 %stenosed.  Ost 3rd Mrg to 3rd Mrg lesion, 99 %stenosed.  2nd Mrg lesion, 50 %stenosed.  Ost 2nd Mrg to 2nd Mrg lesion, 20 %stenosed.  Prox Cx to Mid Cx lesion, 20 %stenosed.  Mid LAD lesion, 90 %stenosed.  1st Diag lesion, 90 %stenosed.  Dist LAD lesion, 80 %stenosed.  LV end diastolic pressure is normal.  Mid LAD to Dist LAD lesion, 20 %stenosed.  Ost LAD to Prox LAD lesion, 20 %stenosed.   1. Severe calcific three vessel CAD 2. Normal filling pressures 3. Ischemic cardiomyopathy (LV function reduced by echo)  Assessment & Plan     CAD -Kristen Bradley has severe CAD. Turned down for CABG. We attempted medical management.  Dr. Angelena Form suggests to consider atherectomy of the LAD and RCA and stenting once renal issues are stable.  -Continue ASA, plavix, metoprolol, Imdur, simvastatin.    Acute on chronic systolic heart failure -Pt usually takes lasix on an as needed basis for increased weight. She has not gained any weight, so she has not taken any lately. She has had DOE and significant orthopnea so that she has had very poor sleep. She has no edema and lungs are clear. -She has ischemic cardiomyopathy and stage 3 chronic kidney disease. She is having worsened dyspnea with minimal exertion. Cannot attempt diuresis at home given her CKD.  We can check renal function and attempt diuresis with Nephrology involvement. She is followed by DR. Mercy Moore as an outpatient. -Monitor labs, weight, I&O (wt in office 02/26/16 134 fully clothed)  CKD stage 3 -Will check kidney function and diurese accordingly. -No ARB/ACE-I, until evaluated by nephrology  Signed, Daune Perch, NP-C 03/03/2016, 7:11 PM Pager: [phone]  The patient was seen and examined, and I agree with the history, physical exam, assessment and plan as documented above by J. Hammond NP-C, with modifications as noted below. 71 yr old woman with severe 3 vessel CAD who was turned down for  CABG who has had progressive exertional dyspnea and orthopnea. LVEF 25% on 01/30/16 with restrictive diastolic dysfunction. Has been using 3 pillows to prop herself up for the past few weeks but has not had much rest. Seldom has fleeting chest pain. Denies leg swelling.  Dr. Angelena Form attempted medical management at home to no avail, and has recommended hospitalization for diuresis with nephrology involvement. He also stated atherectomy of LAD and RCA can be considered once renal issues are stable.  Will start Lasix 40 mg IV bid and monitor renal function given stage 3 CKD (creatinine 1.69 on 05/06/01, uncertain of most recent value) for acute on chronic combined systolic and diastolic heart failure. Continue ASA, Lipitor, Plavix, metoprolol, and nitrates for CAD.   Kate Sable, MD, [facility]  03/03/2016 8:29 PM

## 2016-03-03 NOTE — Telephone Encounter (Signed)
Kristen Bradley is returning your call. Thanks.

## 2016-03-03 NOTE — Progress Notes (Signed)
Pt arrived in stable condition as a direct admit from [facility] office. Paged on call card PA for orders. Pt has tele box placed. Will monitor.

## 2016-03-03 NOTE — Telephone Encounter (Signed)
I have spoken with pt and her son. Pt will go to [facility] this afternoon for admission.

## 2016-03-03 NOTE — Progress Notes (Signed)
Assess for IV, did not see any veins. Placed order for IV team.

## 2016-03-03 NOTE — Telephone Encounter (Signed)
Kristen Bradley has severe CAD. Turned down for CABG. We attempted medical management. She also has an ischemic cardiomyopathy and stage 3 chronic kidney disease. She is having worsened dyspnea with minimal exertion. Cannot attempt diuresis at home given her CKD. Will ask her to come in for admission to cardiology service. We can check renal function and attempt diuresis with Nephrology involvement. She is followed by DR. Mercy Moore as an outpatient. I think we can consider atherectomy of the LAD and RCA and stenting once renal issues are stable.   [name]

## 2016-03-04 ENCOUNTER — Inpatient Hospital Stay (HOSPITAL_COMMUNITY): Payer: Medicare HMO

## 2016-03-04 ENCOUNTER — Encounter (HOSPITAL_COMMUNITY): Payer: Self-pay | Admitting: General Practice

## 2016-03-04 DIAGNOSIS — N184 Chronic kidney disease, stage 4 (severe): Secondary | ICD-10-CM

## 2016-03-04 DIAGNOSIS — R0600 Dyspnea, unspecified: Secondary | ICD-10-CM

## 2016-03-04 DIAGNOSIS — I2510 Atherosclerotic heart disease of native coronary artery without angina pectoris: Secondary | ICD-10-CM

## 2016-03-04 DIAGNOSIS — I5023 Acute on chronic systolic (congestive) heart failure: Secondary | ICD-10-CM

## 2016-03-04 LAB — BASIC METABOLIC PANEL
Anion gap: 9 (ref 5–15)
BUN: 24 mg/dL — AB (ref 6–20)
CHLORIDE: 111 mmol/L (ref 101–111)
CO2: 22 mmol/L (ref 22–32)
CREATININE: 2.09 mg/dL — AB (ref 0.44–1.00)
Calcium: 10.2 mg/dL (ref 8.9–10.3)
GFR, EST AFRICAN AMERICAN: 27 mL/min — AB (ref >60)
GFR, EST NON AFRICAN AMERICAN: 23 mL/min — AB (ref >60)
Glucose, Bld: 119 mg/dL — ABNORMAL HIGH (ref 65–99)
Potassium: 3.6 mmol/L (ref 3.5–5.1)
SODIUM: 142 mmol/L (ref 135–145)

## 2016-03-04 LAB — GLUCOSE, CAPILLARY
GLUCOSE-CAPILLARY: 120 mg/dL — AB (ref 65–99)
GLUCOSE-CAPILLARY: 119 mg/dL — AB (ref 65–99)
GLUCOSE-CAPILLARY: 120 mg/dL — AB (ref 65–99)
Glucose-Capillary: 128 mg/dL — ABNORMAL HIGH (ref 65–99)

## 2016-03-04 LAB — IRON AND TIBC
IRON: 39 ug/dL (ref 28–170)
Saturation Ratios: 12 % (ref 10.4–31.8)
TIBC: 332 ug/dL (ref 250–450)
UIBC: 293 ug/dL

## 2016-03-04 MED ORDER — ZOLPIDEM TARTRATE 5 MG PO TABS
5.0000 mg | ORAL_TABLET | Freq: Every evening | ORAL | Status: DC | PRN
  Administered 2016-03-04: 5 mg via ORAL
  Filled 2016-03-04: qty 1

## 2016-03-04 NOTE — Consult Note (Signed)
Referring Provider: No ref. provider found Primary Care Physician:  Cammy Copa, MD Primary Nephrologist:  Dr. Mercy Moore  Reason for Consultation:  Chronic Renal Disease   HPI: 71 y.o. female with past medical history of DM type II, Hepatitis C, HTN and stroke (2014), renal artery stenosis with stent in 2008, HLD, S/P bilateral CEA, who presents as a direct admit for shortness of breath and orthopnea  EF 25 % medical management recommended but considering atherectomy.   Limited activity -- walks minimal but has 2 pillow orthopnea at rest -- no NSAIDS  No ACE or ARB  Lasix 20mg  daily PRN    No chest pain or palpitations  No diet indiscretion  Some anemia noted   Hb 9.8   BP controlled no thyroid disease  Past Medical History:  Diagnosis Date  . Adrenal tumor 08/2007   surgery  . Cancer of right breast [facility])    s/p lumpectomy, XRT  . Carotid artery disease (HCC)    s/p bilateral CEA  . Hepatitis C    "got treatment for it" (01/29/2016)  . High cholesterol    takes Lopid daily  . History of UTI    takes Diflucan dail--pt states no uti in a couple of yrs though  . Hypertension    takes Losartan,Amlodipine,HCTZ,and Atenolol daily and Clatarpres  . Joint pain    legs  . Renal artery stenosis (HCC)    Right renal artery stent 05/31/06  . Stroke [facility]) Jan. 7, 2014  . Tobacco abuse   . Type II diabetes mellitus ([place])     Past Surgical History:  Procedure Laterality Date  . ABDOMINAL HYSTERECTOMY    . ADRENAL GLAND SURGERY  2009  . BREAST LUMPECTOMY Right 2009  . CARDIAC CATHETERIZATION N/A 02/03/2016   Procedure: Right/Left Heart Cath and Coronary Angiography;  Surgeon: Burnell Blanks, MD;  Location: [place] [facility];  Service: Cardiovascular;  Laterality: N/A;  . ENDARTERECTOMY  01/05/2012   Procedure: ENDARTERECTOMY CAROTID;  Surgeon: Conrad [place] Hills, MD;  Location: [place];  Service: Vascular;  Laterality: Right;  Right Carotid Artery Endarterectomy, right stump  pressure, intraoperative ultrasound  . ENDARTERECTOMY  02/08/2012   Procedure: ENDARTERECTOMY CAROTID;  Surgeon: Conrad Navajo, MD;  Location: [place];  Service: Vascular;  Laterality: Left;  . LUMBAR DISC SURGERY    . PATCH ANGIOPLASTY  02/08/2012   Procedure: PATCH ANGIOPLASTY;  Surgeon: Conrad [place], MD;  Location: [facility] OR;  Service: Vascular;  Laterality: Left;  Carotid artery patch angioplasty using Vascu-Guard bovine patch 1cm x 6cm.  . RENAL ARTERY STENT Right 05/2006    Prior to Admission medications   Medication Sig Start Date End Date Taking? Authorizing Provider  amLODipine (NORVASC) 10 MG tablet Take 10 mg by mouth daily.    Yes Historical Provider, MD  aspirin EC 81 MG tablet Take 81 mg by mouth daily.     Yes Historical Provider, MD  atorvastatin (LIPITOR) 80 MG tablet Take 1 tablet (80 mg total) by mouth daily. 03/02/16 05/31/16 Yes Brittainy Erie Noe, PA-C  cloNIDine (CATAPRES) 0.1 MG tablet Take 0.1 mg by mouth 2 (two) times daily.     Yes Historical Provider, MD  clopidogrel (PLAVIX) 75 MG tablet Take 1 tablet (75 mg total) by mouth daily. 01/05/12  Yes Regina J Roczniak, PA-C  furosemide (LASIX) 20 MG tablet Take 1 tablet (20 mg total) by mouth daily as needed for fluid or edema (For weight gain more than 3 pounds in  one day). 02/04/16  Yes Lavina Hamman, MD  isosorbide mononitrate (IMDUR) 30 MG 24 hr tablet Take 1 tablet (30 mg total) by mouth daily. 02/04/16  Yes Lavina Hamman, MD  metoprolol succinate (TOPROL-XL) 25 MG 24 hr tablet Take 1 tablet (25 mg total) by mouth daily. 02/05/16  Yes Lavina Hamman, MD  nitroGLYCERIN (NITROSTAT) 0.4 MG SL tablet Place 1 tablet (0.4 mg total) under the tongue every 5 (five) minutes as needed for chest pain. 03/02/16  Yes Burnell Blanks, MD  polyethylene glycol [facility] / GLYCOLAX) packet Take 17 g by mouth daily as needed for mild constipation. 02/04/16  Yes Lavina Hamman, MD    Current Facility-Administered Medications  Medication Dose Route  Frequency Provider Last Rate Last Dose  . 0.9 %  sodium chloride infusion  250 mL Intravenous PRN Daune Perch, NP      . acetaminophen (TYLENOL) tablet 650 mg  650 mg Oral Q4H PRN Daune Perch, NP      . amLODipine (NORVASC) tablet 10 mg  10 mg Oral Daily Daune Perch, NP   10 mg at 03/04/16 1039  . aspirin EC tablet 81 mg  81 mg Oral Daily Daune Perch, NP   81 mg at 03/04/16 1039  . atorvastatin (LIPITOR) tablet 80 mg  80 mg Oral q1800 Daune Perch, NP      . cloNIDine (CATAPRES) tablet 0.1 mg  0.1 mg Oral BID Daune Perch, NP   0.1 mg at 03/04/16 1039  . clopidogrel (PLAVIX) tablet 75 mg  75 mg Oral Daily Daune Perch, NP   75 mg at 03/04/16 1039  . furosemide (LASIX) injection 40 mg  40 mg Intravenous BID Daune Perch, NP   40 mg at 03/04/16 1037  . heparin injection 5,000 Units  5,000 Units Subcutaneous Q8H Daune Perch, NP   5,000 Units at 03/04/16 0540  . isosorbide mononitrate (IMDUR) 24 hr tablet 30 mg  30 mg Oral Daily Daune Perch, NP   30 mg at 03/04/16 1039  . metoprolol succinate (TOPROL-XL) 24 hr tablet 25 mg  25 mg Oral Daily Daune Perch, NP   25 mg at 03/04/16 1039  . nitroGLYCERIN (NITROSTAT) SL tablet 0.4 mg  0.4 mg Sublingual Q5 min PRN Daune Perch, NP      . ondansetron (ZOFRAN) injection 4 mg  4 mg Intravenous Q6H PRN Daune Perch, NP      . sodium chloride flush (NS) 0.9 % injection 3 mL  3 mL Intravenous Q12H Daune Perch, NP   3 mL at 03/04/16 1000  . sodium chloride flush (NS) 0.9 % injection 3 mL  3 mL Intravenous PRN Daune Perch, NP        Allergies as of 03/03/2016 - Review Complete 03/03/2016  Allergen Reaction Noted  . Chlorhexidine gluconate Itching 02/03/2012  . Ace inhibitors Cough 08/01/2012  . Dilaudid [hydromorphone hcl] Itching 08/01/2012    Family History  Problem Relation Age of Onset  . Cancer Mother     ? type  . Cancer Father     ? type  . Diabetes Sister   . Hypertension Sister   . Diabetes Brother   .  Hypertension Brother   . CAD Neg Hx     Social History   Social History  . Marital status: Divorced    Spouse name: N/A  . Number of children: 1  . Years of education: N/A   Occupational History  . Retired-truck driver Retired   Science writer  History Main Topics  . Smoking status: Current Every Day Smoker    Packs/day: 0.50    Years: 53.00    Types: Cigarettes  . Smokeless tobacco: Never Used  . Alcohol use No  . Drug use: No  . Sexual activity: Not on file   Other Topics Concern  . Not on file   Social History Narrative  . No narrative on file    Review of Systems: Gen: Denies any fever, chills, sweats, anorexia, fatigue, weakness, malaise, weight loss, and sleep disorder HEENT: No visual complaints, No history of Retinopathy. Normal external appearance No Epistaxis or Sore throat. No sinusitis.   CV: Denies chest pain, angina, palpitations, syncope, orthopnea, PND, peripheral edema, and claudication. Resp: Denies dyspnea at rest, dyspnea with exercise, cough, sputum, wheezing, coughing up blood, and pleurisy. GI: Denies vomiting blood, jaundice, and fecal incontinence.   Denies dysphagia or odynophagia. GU : Denies urinary burning, blood in urine, urinary frequency, urinary hesitancy, nocturnal urination, and urinary incontinence.  No renal calculi. MS: Denies joint pain, limitation of movement, and swelling, stiffness, low back pain, extremity pain. Denies muscle weakness, cramps, atrophy.  No use of non steroidal antiinflammatory drugs. Derm: Denies rash, itching, dry skin, hives, moles, warts, or unhealing ulcers.  Psych: Denies depression, anxiety, memory loss, suicidal ideation, hallucinations, paranoia, and confusion. Heme: Denies bruising, bleeding, and enlarged lymph nodes. Neuro: No headache.  No diplopia. No dysarthria.  No dysphasia.  No history of CVA.  No Seizures. No paresthesias.  No weakness. Endocrine No DM.  No Thyroid disease.  No Adrenal disease.  Physical  Exam: Vital signs in last 24 hours: Temp:  [98.4 F (36.9 C)-99 F (37.2 C)] 98.5 F (36.9 C) (02/01 0958) Pulse Rate:  [71-81] 73 (02/01 1259) Resp:  [18-19] 18 (02/01 0634) BP: (124-143)/(55-74) 124/63 (02/01 1259) SpO2:  [96 %-100 %] 100 % (02/01 1259) Weight:  [57.9 kg (127 lb 9.6 oz)-59.2 kg (130 lb 9.6 oz)] 57.9 kg (127 lb 9.6 oz) (02/01 0623) Last BM Date: 03/03/16 General:   Alert,  Well-developed, well-nourished, pleasant and cooperative in NAD Head:  Normocephalic and atraumatic. Eyes:  Sclera clear, no icterus.   Conjunctiva pink. Ears:  Normal auditory acuity. Nose:  No deformity, discharge,  or lesions. Mouth:  No deformity or lesions, dentition normal. Neck:  Supple; no masses or thyromegaly. JVP not elevated Lungs:  Clear throughout to auscultation.   No wheezes, crackles, or rhonchi. No acute distress. Heart:  Regular rate and rhythm; no murmurs, clicks, rubs,  or gallops. Abdomen:  Soft, nontender and nondistended. No masses, hepatosplenomegaly or hernias noted. Normal bowel sounds, without guarding, and without rebound.   Msk:  Symmetrical without gross deformities. Normal posture. Pulses:  No carotid, renal, femoral bruits. DP and PT symmetrical and equal Extremities:  Without clubbing or edema. Neurologic:  Alert and  oriented x4;  grossly normal neurologically. Skin:  Intact without significant lesions or rashes. Cervical Nodes:  No significant cervical adenopathy. Psych:  Alert and cooperative. Normal mood and affect.  Intake/Output from previous day: 01/31 0701 - 02/01 0700 In: 240 [P.O.:240] Out: 1550 [Urine:1550] Intake/Output this shift: Total I/O In: 680 [P.O.:680] Out: 1350 [Urine:1350]  Lab Results:  Recent Labs  03/03/16 2123  WBC 3.9*  HGB 9.8*  HCT 30.4*  PLT 121*   BMET  Recent Labs  03/03/16 2123 03/04/16 0521  NA 142 142  K 4.0 3.6  CL 109 111  CO2 23 22  GLUCOSE 137* 119*  BUN  25* 24*  CREATININE 2.00* 2.09*  CALCIUM  10.5* 10.2   LFT No results for input(s): PROT, ALBUMIN, AST, ALT, ALKPHOS, BILITOT, BILIDIR, IBILI in the last 72 hours. PT/INR No results for input(s): LABPROT, INR in the last 72 hours. Hepatitis Panel No results for input(s): HEPBSAG, HCVAB, HEPAIGM, HEPBIGM in the last 72 hours.  Studies/Results: Dg Chest 2 View  Result Date: 03/04/2016 CLINICAL DATA:  Shortness of breath, history of right breast carcinoma, hepatitis-C, diabetes, smoking history EXAM: CHEST  2 VIEW COMPARISON:  Portable chest x-ray of 01/29/2016 FINDINGS: There is now cardiomegaly present with bilateral effusions and mild pulmonary vascular congestion, most consistent with mild congestive heart failure. No focal pneumonia is seen. Moderate thoracic aortic atherosclerosis is present. No acute fracture is seen with the thoracic vertebrae in normal alignment. IMPRESSION: Probable mild CHF with cardiomegaly, pulmonary vascular congestion, and small pleural effusions. Moderate thoracic aortic atherosclerosis. Electronically Signed   By: Ivar Drape M.D.   On: 03/04/2016 09:41    Assessment/Plan:  Acute on Chronic renal disease appears to be related to increase diuresis - this appears to be an expected rise in creatinine. Doubt that this represents any intrinsic renal disease or any obstruction. We could check a renal ultrasound and urinalysis if renal function does not stablilize although it is not far from baseline  Chronic renal disease secondary to renal vascular disease  Stent in past  At present creatinine fairly stable and blood pressure controlled  No indication for a procedure  Anemia check iron studies and consider iron  Bones PTH controlled at [facility]   HTN /Volume agree with diuresis may convert to lasix 40mg  daily oral    LOS: 1 Whittaker Lenis W @TODAY @2 :35 PM

## 2016-03-04 NOTE — Care Management Note (Signed)
Case Management Note  Patient Details  Name: Kristen Bradley MRN: [id number] Date of Birth: 1945/12/02  Subjective/Objective:         Admitted with   Heart Failure         Action/Plan: Patient lives at home with spouse; Primary Physician: Cammy Copa, MD; has private insurance with [facility] with prescription drug coverage; CM will continue to follow for DCP  Expected Discharge Date:   possibly 03/08/2016               Expected Discharge Plan:  Home/Self Care  In-House Referral:   [facility]  Discharge planning Services  CM Consult   Status of Service:  In process, will continue to follow  Sherrilyn Rist [phone] 03/04/2016, 10:04 AM

## 2016-03-04 NOTE — Progress Notes (Signed)
CCMD called pt HR dropping to 30's non sustained. Telemetry reviewed. Cardiology paged regarding event. Pt lying in bed resting comfortably no complaints at this time.

## 2016-03-04 NOTE — Progress Notes (Signed)
Pt refused bed alarm on, educated on patient's safety plan. Pt alert and oriented, family at bedside. Will continue to do hourly rounding.

## 2016-03-04 NOTE — Progress Notes (Signed)
Progress Note  Patient Name: Kristen Bradley Date of Encounter: 03/04/2016  Primary Cardiologist: Dr. Angelena Form  Subjective   Breathing is somewhat better this morning.   Inpatient Medications    Scheduled Meds: . amLODipine  10 mg Oral Daily  . aspirin EC  81 mg Oral Daily  . atorvastatin  80 mg Oral q1800  . cloNIDine  0.1 mg Oral BID  . clopidogrel  75 mg Oral Daily  . furosemide  40 mg Intravenous BID  . heparin  5,000 Units Subcutaneous Q8H  . isosorbide mononitrate  30 mg Oral Daily  . metoprolol succinate  25 mg Oral Daily  . sodium chloride flush  3 mL Intravenous Q12H   Continuous Infusions:  PRN Meds: sodium chloride, acetaminophen, nitroGLYCERIN, ondansetron (ZOFRAN) IV, sodium chloride flush   Vital Signs    Vitals:   03/03/16 2106 03/04/16 0010 03/04/16 0634 03/04/16 0958  BP: (!) 143/59 (!) 138/56 (!) 141/74 (!) 141/65  Pulse: 71 80 79 72  Resp: 19 19 18    Temp: 99 F (37.2 C) 98.4 F (36.9 C) 98.7 F (37.1 C) 98.5 F (36.9 C)  TempSrc: Oral Oral Oral Oral  SpO2: 97% 96% 100% 97%  Weight:   127 lb 9.6 oz (57.9 kg)   Height:        Intake/Output Summary (Last 24 hours) at 03/04/16 1042 Last data filed at 03/04/16 1038  Gross per 24 hour  Intake              800 ml  Output             1550 ml  Net             -750 ml   Filed Weights   03/03/16 1900 03/04/16 0634  Weight: 130 lb 9.6 oz (59.2 kg) 127 lb 9.6 oz (57.9 kg)    Telemetry    SR with brief episode of SB 2nd degree AVB? asymptomatic - Personally Reviewed  ECG    N/a - Personally Reviewed  Physical Exam   GEN: Thin AAF, No acute distress.   Neck: mild JVD Cardiac: RRR, 2/6 systoli murmur, rubs, or gallops.  Respiratory: Clear to auscultation bilaterally. GI: Soft, nontender, non-distended  MS: No edema; No deformity. Neuro:  Nonfocal  Psych: Normal affect   Labs    Chemistry Recent Labs Lab 03/03/16 2123 03/04/16 0521  NA 142 142  K 4.0 3.6  CL 109 111  CO2 23 22   GLUCOSE 137* 119*  BUN 25* 24*  CREATININE 2.00* 2.09*  CALCIUM 10.5* 10.2  GFRNONAA 24* 23*  GFRAA 28* 27*  ANIONGAP 10 9     Hematology Recent Labs Lab 03/03/16 2123  WBC 3.9*  RBC 3.39*  HGB 9.8*  HCT 30.4*  MCV 89.7  MCH 28.9  MCHC 32.2  RDW 15.2  PLT 121*    Cardiac EnzymesNo results for input(s): TROPONINI in the last 168 hours. No results for input(s): TROPIPOC in the last 168 hours.   BNP Recent Labs Lab 03/03/16 2123  BNP 3,214.8*     DDimer No results for input(s): DDIMER in the last 168 hours.   Radiology    Dg Chest 2 View  Result Date: 03/04/2016 CLINICAL DATA:  Shortness of breath, history of right breast carcinoma, hepatitis-C, diabetes, smoking history EXAM: CHEST  2 VIEW COMPARISON:  Portable chest x-ray of 01/29/2016 FINDINGS: There is now cardiomegaly present with bilateral effusions and mild pulmonary vascular congestion, most consistent with  mild congestive heart failure. No focal pneumonia is seen. Moderate thoracic aortic atherosclerosis is present. No acute fracture is seen with the thoracic vertebrae in normal alignment. IMPRESSION: Probable mild CHF with cardiomegaly, pulmonary vascular congestion, and small pleural effusions. Moderate thoracic aortic atherosclerosis. Electronically Signed   By: Ivar Drape M.D.   On: 03/04/2016 09:41    Cardiac Studies   None  Patient Profile     71 y.o. female  With PMH of 3v CAD (not CABG candidate), DM type II, Hepatitis C, HTN and stroke (2014), renal artery stenosis with stent in 2008, HLD, S/P bilateral CEA who presented with progressive dyspnea.  Assessment & Plan    1. Chronic systolic HF: Reports progressive dyspnea over the past couple of days. Called the office with attempts to diuresis gently, but with worsening renal function, felt best that she come inpatient. BNP significantly elevated 3214, CXR with edema and bilateral small pleural effusions.  -- started on IV lasix 40mg  BID no  admission 1.5L UOP yesterday, breathing improved. Weight appears stable  2. CAD 3vdisease: Turned down for CABG. We attempted medical management.  Dr. Angelena Form suggests could consider atherectomy of the LAD and RCA and stenting if renal issues stablize.  -- continue ASA, plavix, BB, Imdur, statin  3. CKD III: Cr slightly worse this admission, up from 1.7 on 02/04/16. Sees nephrology in the outpatient -- will ask for nephrology consult for assistance with diuresis  4. HTN: Borderline controlled, will monitor with diuresis.   Signed, Reino Bellis, NP  03/04/2016, 10:42 AM

## 2016-03-05 ENCOUNTER — Inpatient Hospital Stay (HOSPITAL_COMMUNITY): Payer: Medicare HMO

## 2016-03-05 LAB — GLUCOSE, CAPILLARY
GLUCOSE-CAPILLARY: 118 mg/dL — AB (ref 65–99)
Glucose-Capillary: 111 mg/dL — ABNORMAL HIGH (ref 65–99)

## 2016-03-05 LAB — BASIC METABOLIC PANEL
Anion gap: 12 (ref 5–15)
BUN: 28 mg/dL — AB (ref 6–20)
CO2: 25 mmol/L (ref 22–32)
CREATININE: 2.06 mg/dL — AB (ref 0.44–1.00)
Calcium: 10.3 mg/dL (ref 8.9–10.3)
Chloride: 105 mmol/L (ref 101–111)
GFR calc Af Amer: 27 mL/min — ABNORMAL LOW (ref >60)
GFR, EST NON AFRICAN AMERICAN: 23 mL/min — AB (ref >60)
GLUCOSE: 116 mg/dL — AB (ref 65–99)
Potassium: 3.3 mmol/L — ABNORMAL LOW (ref 3.5–5.1)
SODIUM: 142 mmol/L (ref 135–145)

## 2016-03-05 MED ORDER — FUROSEMIDE 80 MG PO TABS: 80.0000 mg | ORAL_TABLET | Freq: Two times a day (BID) | ORAL | 3 refills | Status: DC

## 2016-03-05 MED ORDER — FUROSEMIDE 80 MG PO TABS
80.0000 mg | ORAL_TABLET | Freq: Two times a day (BID) | ORAL | Status: DC
  Administered 2016-03-05: 80 mg via ORAL
  Filled 2016-03-05: qty 1

## 2016-03-05 MED ORDER — SODIUM CHLORIDE 0.9 % IV SOLN
510.0000 mg | Freq: Once | INTRAVENOUS | Status: AC
  Administered 2016-03-05: 510 mg via INTRAVENOUS
  Filled 2016-03-05: qty 17

## 2016-03-05 MED ORDER — METOPROLOL SUCCINATE ER 25 MG PO TB24: 25.0000 mg | ORAL_TABLET | Freq: Every day | ORAL | 3 refills | Status: DC

## 2016-03-05 MED ORDER — AMLODIPINE BESYLATE 10 MG PO TABS: 10.0000 mg | ORAL_TABLET | Freq: Every day | ORAL | Status: DC

## 2016-03-05 NOTE — Discharge Summary (Addendum)
Discharge Summary    Patient ID: Kristen Bradley,  MRN: [id number], DOB/AGE: 70-May-1947 70 y.o.  Admit date: 03/03/2016 Discharge date: 03/05/2016  Primary Care Provider: Cammy Copa Primary Cardiologist: Dr. Angelena Form   Discharge Diagnoses    Active Problems:   Acute on chronic systolic heart failure (HCC)   Dyspnea   CKD (chronic kidney disease) stage 4, GFR 15-29 ml/min (HCC)   CAD, multiple vessel   Allergies Allergies  Allergen Reactions  . Chlorhexidine Gluconate Itching  . Ace Inhibitors Cough  . Dilaudid [Hydromorphone Hcl] Itching    Diagnostic Studies/Procedures    None _____________   History of Present Illness     Kristen Bradley is a 71 y.o. female with past medical history of DM type II, Hepatitis C, HTN and stroke (2014), renal artery stenosis with stent in 2008, HLD, S/P bilateral CEA, who presents as a direct admit for shortness of breath and orthopnea.  Kristen Bradley has severe 3 vessel CAD. Turned down for CABG. Currently attempting medical management. She also has an ischemic cardiomyopathy and stage 3 chronic kidney disease. She presented with worsened dyspnea with minimal exertion. Unable to attempt diuresis at home given her CKD. She is followed by Dr. Mercy Moore as an outpatient. Dr. Angelena Form suggests to consider atherectomy of the LAD and RCA and stenting once renal issues are stable.   Prior to admission she was taking lasix on an as needed basis for increased weight gain. Reported she had not gained any weight, so she has not taken any lately. She has had DOE and significant orthopnea so that she has had very poor sleep. BNP on admission was 3214, Hgb 9.8, and CXR showed vascular congestion with small pleural effusion.   Hospital Course     Consultants: Nephrology    She was admitted and started on IV lasix 40mg  BID with good UOP. Diuresed a total of 4L this admission. Nephrology asked to consult given her known kidney disease. Cr remained  stable at 2 with diuresis. She reported breathing better, and weight trended down 130>>123lbs.   She was switched to PO lasix 80mg  BID on 03/05/16. Seen by Dr. Debara Pickett and determined stable for discharge. Close follow up in the office has been arranged for next week. Further intervention options regarding her CAD with need to be explored by her primary cardiologist, and would, like to have nephrology weigh in on her risk involved with PCI. Have asked that she follow up with Dr. Mercy Moore in the coming weeks regarding this matter.   Physical Exam:   DJT:TSVX AAF, No acute distress.   Neck:mild JVD Cardiac:RRR, 2/6 systoli murmur, rubs, or gallops.  Respiratory:Clear to auscultation bilaterally. BL:TJQZ, nontender, non-distended  MS:No edema; No deformity. Neuro:Nonfocal  Psych: Normal affect  _____________  Discharge Vitals Blood pressure (!) 130/59, pulse 79, temperature 97.7 F (36.5 C), temperature source Oral, resp. rate 18, height 5\' 5"  (1.651 m), weight 123 lb (55.8 kg), SpO2 99 %.  Filed Weights   03/03/16 1900 03/04/16 0634 03/05/16 0609  Weight: 130 lb 9.6 oz (59.2 kg) 127 lb 9.6 oz (57.9 kg) 123 lb (55.8 kg)    Labs & Radiologic Studies    CBC  Recent Labs  03/03/16 2123  WBC 3.9*  NEUTROABS 2.5  HGB 9.8*  HCT 30.4*  MCV 89.7  PLT 009*   Basic Metabolic Panel  Recent Labs  03/04/16 0521 03/05/16 0620  NA 142 142  K 3.6 3.3*  CL 111 105  CO2 22 25  GLUCOSE 119* 116*  BUN 24* 28*  CREATININE 2.09* 2.06*  CALCIUM 10.2 10.3   Liver Function Tests No results for input(s): AST, ALT, ALKPHOS, BILITOT, PROT, ALBUMIN in the last 72 hours. No results for input(s): LIPASE, AMYLASE in the last 72 hours. Cardiac Enzymes No results for input(s): CKTOTAL, CKMB, CKMBINDEX, TROPONINI in the last 72 hours. BNP Invalid input(s): POCBNP D-Dimer No results for input(s): DDIMER in the last 72 hours. Hemoglobin A1C No results for input(s): HGBA1C in the last 72  hours. Fasting Lipid Panel No results for input(s): CHOL, HDL, LDLCALC, TRIG, CHOLHDL, LDLDIRECT in the last 72 hours. Thyroid Function Tests No results for input(s): TSH, T4TOTAL, T3FREE, THYROIDAB in the last 72 hours.  Invalid input(s): FREET3 _____________  Dg Chest 2 View  Result Date: 03/04/2016 CLINICAL DATA:  Shortness of breath, history of right breast carcinoma, hepatitis-C, diabetes, smoking history EXAM: CHEST  2 VIEW COMPARISON:  Portable chest x-ray of 01/29/2016 FINDINGS: There is now cardiomegaly present with bilateral effusions and mild pulmonary vascular congestion, most consistent with mild congestive heart failure. No focal pneumonia is seen. Moderate thoracic aortic atherosclerosis is present. No acute fracture is seen with the thoracic vertebrae in normal alignment. IMPRESSION: Probable mild CHF with cardiomegaly, pulmonary vascular congestion, and small pleural effusions. Moderate thoracic aortic atherosclerosis. Electronically Signed   By: Ivar Drape M.D.   On: 03/04/2016 09:41   US Renal  Result Date: 03/05/2016 CLINICAL DATA:  Chronic kidney disease stage 4. Chronic hypertension. EXAM: RENAL / URINARY TRACT ULTRASOUND COMPLETE COMPARISON:  CT on 07/30/2013 FINDINGS: Right Kidney: Length: 10.3 cm. Increased renal parenchymal echogenicity. Several tiny sub-cm cysts noted. No mass or hydronephrosis visualized. Left Kidney: Length: 9.5 cm. Increased renal parenchymal echogenicity. 2 tiny sub-cm renal cysts noted. Renal vascular calcifications seen in renal hilum, as demonstrated on previous CT. No mass or hydronephrosis visualized. Bladder: Empty at time of exam. Other: Right pleural effusion incidentally noted. IMPRESSION: Increased renal parenchymal echogenicity, consistent with medical renal disease. No evidence of hydronephrosis. Right pleural effusion incidentally noted. Electronically Signed   By: Earle Gell M.D.   On: 03/05/2016 11:11   Disposition   Pt is being  discharged home today in good condition.  Follow-up Plans & Appointments    Follow-up Information    MATTINGLY,MICHAEL T, MD Follow up.   Specialty:  Nephrology Why:  Please arrange follow up within 2 weeks: left a message for office to call patient Contact information: [place] [address] [phone]        Lyda Jester, PA-C Follow up on 03/09/2016.   Specialties:  Cardiology, Radiology Why:  at 11:30am for your follow up appt.  Contact information: [address] [place] [address] [phone]          Discharge Instructions    (HEART FAILURE PATIENTS) Call MD:  Anytime you have any of the following symptoms: 1) 3 pound weight gain in 24 hours or 5 pounds in 1 week 2) shortness of breath, with or without a dry hacking cough 3) swelling in the hands, feet or stomach 4) if you have to sleep on extra pillows at night in order to breathe.    Complete by:  As directed    Diet - low sodium heart healthy    Complete by:  As directed    Discharge instructions    Complete by:  As directed    For patients with congestive heart failure, we  give them these special instructions:  1. Follow a low-salt diet and watch your fluid intake. In general, you should not be taking in more than 2 liters of fluid per day (no more than 8 glasses per day). Some patients are restricted to less than 1.5 liters of fluid per day (no more than 6 glasses per day). This includes sources of water in foods like soup, coffee, tea, milk, etc. 2. Weigh yourself on the same scale at same time of day and keep a log. 3. Call your doctor: (Anytime you feel any of the following symptoms)  - 3-4 pound weight gain in 1-2 days or 2 pounds overnight  - Shortness of breath, with or without a dry hacking cough  - Swelling in the hands, feet or stomach  - If you have to sleep on extra pillows at night in order to breathe   IT IS IMPORTANT TO LET YOUR DOCTOR KNOW EARLY ON IF YOU ARE  HAVING SYMPTOMS SO WE CAN HELP YOU!   We increased your lasix dosing this admission at the recommendation of the nephrologist. You will be on 80mg  table twice a day. Please continue to check your weight daily. Will need to check your kidney function at your follow up appt next week.   Increase activity slowly    Complete by:  As directed       Discharge Medications   Current Discharge Medication List    CONTINUE these medications which have CHANGED   Details  furosemide (LASIX) 80 MG tablet Take 1 tablet (80 mg total) by mouth 2 (two) times daily. Qty: 60 tablet, Refills: 3    metoprolol succinate (TOPROL-XL) 25 MG 24 hr tablet Take 1 tablet (25 mg total) by mouth daily. Qty: 60 tablet, Refills: 3      CONTINUE these medications which have NOT CHANGED   Details  amLODipine (NORVASC) 10 MG tablet Take 10 mg by mouth daily.     aspirin EC 81 MG tablet Take 81 mg by mouth daily.      atorvastatin (LIPITOR) 80 MG tablet Take 1 tablet (80 mg total) by mouth daily. Qty: 90 tablet, Refills: 3    cloNIDine (CATAPRES) 0.1 MG tablet Take 0.1 mg by mouth 2 (two) times daily.      clopidogrel (PLAVIX) 75 MG tablet Take 1 tablet (75 mg total) by mouth daily. Qty: 30 tablet, Refills: 11    isosorbide mononitrate (IMDUR) 30 MG 24 hr tablet Take 1 tablet (30 mg total) by mouth daily. Qty: 60 tablet, Refills: 0    nitroGLYCERIN (NITROSTAT) 0.4 MG SL tablet Place 1 tablet (0.4 mg total) under the tongue every 5 (five) minutes as needed for chest pain. Qty: 25 tablet, Refills: 3    polyethylene glycol (MIRALAX / GLYCOLAX) packet Take 17 g by mouth daily as needed for mild constipation. Qty: 14 each, Refills: 0         Outstanding Labs/Studies   BMET at follow up  Duration of Discharge Encounter   Greater than 30 minutes including physician time.  Signed, Reino Bellis NP-C 03/05/2016, 2:06 PM   Pt. Seen and examined. Agree with the Resident/NP/PA-C note as written. Agree  with NP exam - appears euvolemic. Changed to lasix 80 mg BID. [place] for d/c home today.  Pixie Casino, MD, [facility] Attending Cardiologist [place]

## 2016-03-05 NOTE — Progress Notes (Signed)
Subjective: Interval History: has no complaint, breathing better.  Objective: Vital signs in last 24 hours: Temp:  [98.5 F (36.9 C)-99.1 F (37.3 C)] 98.5 F (36.9 C) (02/02 0609) Pulse Rate:  [70-77] 70 (02/02 0609) Resp:  [20] 20 (02/02 0609) BP: (124-142)/(57-66) 139/57 (02/02 0609) SpO2:  [97 %-100 %] 98 % (02/02 0609) Weight:  [55.8 kg (123 lb)] 55.8 kg (123 lb) (02/02 0609) Weight change: -3.447 kg (-7 lb 9.6 oz)  Intake/Output from previous day: 02/01 0701 - 02/02 0700 In: 1000 [P.O.:1000] Out: 9924 [Urine:4050] Intake/Output this shift: No intake/output data recorded.  General appearance: alert, cooperative, no distress and no O2 Resp: rales bibasilar Cardio: S1, S2 normal and systolic murmur: holosystolic 2/6, blowing at apex GI: soft, non-tender; bowel sounds normal; no masses,  no organomegaly Extremitie1+edema edema  Lab Results:  Recent Labs  03/03/16 2123  WBC 3.9*  HGB 9.8*  HCT 30.4*  PLT 121*   BMET:  Recent Labs  03/03/16 2123 03/04/16 0521  NA 142 142  K 4.0 3.6  CL 109 111  CO2 23 22  GLUCOSE 137* 119*  BUN 25* 24*  CREATININE 2.00* 2.09*  CALCIUM 10.5* 10.2   No results for input(s): PTH in the last 72 hours. Iron Studies:  Recent Labs  03/04/16 1532  IRON 39  TIBC 332    Studies/Results: Dg Chest 2 View  Result Date: 03/04/2016 CLINICAL DATA:  Shortness of breath, history of right breast carcinoma, hepatitis-C, diabetes, smoking history EXAM: CHEST  2 VIEW COMPARISON:  Portable chest x-ray of 01/29/2016 FINDINGS: There is now cardiomegaly present with bilateral effusions and mild pulmonary vascular congestion, most consistent with mild congestive heart failure. No focal pneumonia is seen. Moderate thoracic aortic atherosclerosis is present. No acute fracture is seen with the thoracic vertebrae in normal alignment. IMPRESSION: Probable mild CHF with cardiomegaly, pulmonary vascular congestion, and small pleural effusions. Moderate  thoracic aortic atherosclerosis. Electronically Signed   By: Ivar Drape M.D.   On: 03/04/2016 09:41    I have reviewed the patient's current medications.  Assessment/Plan: 1 CKD 4 stable, vol improving , can use po Lasix 2 CHF diuresing use po 3 RAS 4 DM 5 Anemia give Fe 6 HPTH check P check PTH, give Fe, po Lasix    LOS: 2 days   Denzal Meir L 03/05/2016,7:28 AM

## 2016-03-05 NOTE — Progress Notes (Signed)
Pt slept well overnight, vitals stable, no any specific complain of chest pain and distress noted, Ambien provided as per patient request to sleep, will continue to monitor the patient.

## 2016-03-06 LAB — PARATHYROID HORMONE, INTACT (NO CA): PTH: 141 pg/mL — ABNORMAL HIGH (ref 15–65)

## 2016-03-09 ENCOUNTER — Ambulatory Visit (INDEPENDENT_AMBULATORY_CARE_PROVIDER_SITE_OTHER): Payer: Medicare HMO | Admitting: Cardiology

## 2016-03-09 ENCOUNTER — Encounter (INDEPENDENT_AMBULATORY_CARE_PROVIDER_SITE_OTHER): Payer: Self-pay

## 2016-03-09 ENCOUNTER — Encounter: Payer: Self-pay | Admitting: Cardiology

## 2016-03-09 VITALS — BP 124/40 | HR 69 | Ht 64.0 in | Wt 124.8 lb

## 2016-03-09 DIAGNOSIS — I5022 Chronic systolic (congestive) heart failure: Secondary | ICD-10-CM | POA: Diagnosis not present

## 2016-03-09 DIAGNOSIS — I2510 Atherosclerotic heart disease of native coronary artery without angina pectoris: Secondary | ICD-10-CM

## 2016-03-09 NOTE — Progress Notes (Signed)
03/09/2016 [name]   [birth year]  [id number]  Primary Physician Cammy Copa, MD Primary Cardiologist: Dr. Angelena Form    Reason for Visit/CC: [facility] F/u for Acute on Chronic Systolic CHF exacerbation   HPI:  Kristen Bradley is a 71 y/o female, followed by Dr. Angelena Form, who presents to clinic for post hospital f/u and f/u for CAD and systolic HF. She recently underwent a LHC to evaluate for exertional dyspnea. She was found to have 3V CAD and systolic HF with EF of 41% and was evaluated by CT surgery for possible CABG. She was seen in consultation by Dr. Cyndia Bent. Unfortunately given the diffuse calcification of her aorta and severely calcified vessels, she would not be a candidate for bypass surgery. Percutaneous intervention would also be a very challenging option for her and would need to be performed with atherectomy. Dr. Angelena Form and Dr. Cyndia Bent had booth concluded that medical management would be best appropriate. Imdur was added to her regimen. She was continued on metoprolol and Plavix. Her metoprolol dose, however was reduced due to 2.2 sec pause on telemetry. No ACE/ARB given CKD.  PRN Lasix was added. It was noted in hospital notes, "If anginal symptoms worsen or become more worrisome, Dr. Angelena Form stated that we could possibly entertain complex high risk atherectomy PCI procedure".   She was readmitted 1/31- 03/05/16 for acute CHF exacerbation. Prior to admission she was taking lasix on an as needed basis for increased weight gain. However she reported she had not gained any weight, so she was not taking it. She had DOE and significant orthopnea. BNP on admission was 3214, Hgb 9.8, and CXR showed vascular congestion with small pleural effusion. She was treated with IV lasix and had good diuresis. - 4L out. Weight trended down from 130>>123 lb. She was placed on PO lasix 80 mg BID. Her Scr remained stable ~2. She has outpatient f/u with Dr. Jerrel Ivory on 03/15/16. Further intervention  options regarding her CAD will need to be explored by her primary cardiologist, and we would like to have nephrology weigh in on her risk involved with PCI.   She is here in clinic today for post hospital f/u. She denies any chest pain. No dyspnea or orthopnea. No LEE. She reports good urine output. Fully compliant with meds. HR and BP both stable.   Current Meds  Medication Sig  . amLODipine (NORVASC) 10 MG tablet Take 10 mg by mouth daily.   Marland Kitchen aspirin EC 81 MG tablet Take 81 mg by mouth daily.    Marland Kitchen atorvastatin (LIPITOR) 80 MG tablet Take 1 tablet (80 mg total) by mouth daily.  . cloNIDine (CATAPRES) 0.1 MG tablet Take 0.1 mg by mouth 2 (two) times daily.    . clopidogrel (PLAVIX) 75 MG tablet Take 1 tablet (75 mg total) by mouth daily.  . furosemide (LASIX) 80 MG tablet Take 1 tablet (80 mg total) by mouth 2 (two) times daily.  . isosorbide mononitrate (IMDUR) 30 MG 24 hr tablet Take 1 tablet (30 mg total) by mouth daily.  Marland Kitchen losartan (COZAAR) 100 MG tablet Take 1 tablet by mouth daily.  . metoprolol succinate (TOPROL-XL) 25 MG 24 hr tablet Take 1 tablet (25 mg total) by mouth daily.  . nitroGLYCERIN (NITROSTAT) 0.4 MG SL tablet Place 1 tablet (0.4 mg total) under the tongue every 5 (five) minutes as needed for chest pain.  . polyethylene glycol (MIRALAX / GLYCOLAX) packet Take 17 g by mouth daily as needed for mild constipation.  Marland Kitchen  traMADol (ULTRAM) 50 MG tablet Take 1 tablet by mouth as directed.   Allergies  Allergen Reactions  . Chlorhexidine Gluconate Itching  . Ace Inhibitors Cough  . Dilaudid [Hydromorphone Hcl] Itching   Past Medical History:  Diagnosis Date  . Adrenal tumor 08/2007   surgery  . Cancer of right breast [facility])    s/p lumpectomy, XRT  . Carotid artery disease (HCC)    s/p bilateral CEA  . Hepatitis C    "got treatment for it" (01/29/2016)  . High cholesterol    takes Lopid daily  . History of UTI    takes Diflucan dail--pt states no uti in a couple of yrs  though  . Hypertension    takes Losartan,Amlodipine,HCTZ,and Atenolol daily and Clatarpres  . Joint pain    legs  . Renal artery stenosis (HCC)    Right renal artery stent 05/31/06  . Stroke [facility]) Jan. 7, 2014  . Tobacco abuse   . Type II diabetes mellitus (HCC)    Family History  Problem Relation Age of Onset  . Cancer Mother     ? type  . Cancer Father     ? type  . Diabetes Sister   . Hypertension Sister   . Diabetes Brother   . Hypertension Brother   . CAD Neg Hx    Past Surgical History:  Procedure Laterality Date  . ABDOMINAL HYSTERECTOMY    . ADRENAL GLAND SURGERY  2009  . BREAST LUMPECTOMY Right 2009  . CARDIAC CATHETERIZATION N/A 02/03/2016   Procedure: Right/Left Heart Cath and Coronary Angiography;  Surgeon: Burnell Blanks, MD;  Location: [place] [facility];  Service: Cardiovascular;  Laterality: N/A;  . ENDARTERECTOMY  01/05/2012   Procedure: ENDARTERECTOMY CAROTID;  Surgeon: Conrad Pontoon Beach, MD;  Location: [place];  Service: Vascular;  Laterality: Right;  Right Carotid Artery Endarterectomy, right stump pressure, intraoperative ultrasound  . ENDARTERECTOMY  02/08/2012   Procedure: ENDARTERECTOMY CAROTID;  Surgeon: Conrad Stapleton, MD;  Location: [place];  Service: Vascular;  Laterality: Left;  . LUMBAR DISC SURGERY    . PATCH ANGIOPLASTY  02/08/2012   Procedure: PATCH ANGIOPLASTY;  Surgeon: Conrad Copiague, MD;  Location: [facility];  Service: Vascular;  Laterality: Left;  Carotid artery patch angioplasty using Vascu-Guard bovine patch 1cm x 6cm.  . RENAL ARTERY STENT Right 05/2006   Social History   Social History  . Marital status: Divorced    Spouse name: N/A  . Number of children: 1  . Years of education: N/A   Occupational History  . Retired-truck driver Retired   Social History Main Topics  . Smoking status: Current Every Day Smoker    Packs/day: 0.50    Years: 53.00    Types: Cigarettes  . Smokeless tobacco: Never Used  . Alcohol use No  . Drug use: No  .  Sexual activity: Not on file   Other Topics Concern  . Not on file   Social History Narrative  . No narrative on file     Review of Systems: General: negative for chills, fever, night sweats or weight changes.  Cardiovascular: negative for chest pain, dyspnea on exertion, edema, orthopnea, palpitations, paroxysmal nocturnal dyspnea or shortness of breath Dermatological: negative for rash Respiratory: negative for cough or wheezing Urologic: negative for hematuria Abdominal: negative for nausea, vomiting, diarrhea, bright red blood per rectum, melena, or hematemesis Neurologic: negative for visual changes, syncope, or dizziness All other systems reviewed and are otherwise negative except as  noted above.   Physical Exam:  Blood pressure (!) 124/40, pulse 69, height 5\' 4"  (1.626 m), weight 124 lb 12.8 oz (56.6 kg), SpO2 99 %.  General appearance: alert, cooperative and no distress Neck: no carotid bruit and no JVD Lungs: clear to auscultation bilaterally Heart: regular rate and rhythm, S1, S2 normal, no murmur, click, rub or gallop Extremities: extremities normal, atraumatic, no cyanosis or edema Pulses: 2+ and symmetric Skin: Skin color, texture, turgor normal. No rashes or lesions Neurologic: Grossly normal  EKG not performed   ASSESSMENT AND PLAN:   1. CAD: recent LHC showed severe calcification in all three vessels. Her anatomy is not favorable for PCI. Not a candidate for CABG given diffuse calcification of her aorta and severely calcified vessels. Medical therapy elected as initial therapy. However after her recent hospitalization, our doctors felt that medical therapy alone would not improve her LVF and it is felt that she would best benefit, from a cardiac standpoint, from PCI. Dr. Angelena Form stated earlier that we could possibly entertain complex high risk atherectomy PCI procedure. However, patient reported she would not consider dialysis - this may be the only thing keeping  her from intervention. She has f/u with Dr. Mercy Moore on 03/15/16 and plans to discuss this with him. Appreciate nephrology recs as to feasibility of PCI and relative likelihood of needed short-term or long-term dialysis as well as management suggestions related to diuresis.  For now, continue medical therapy w/ ASA, Plavix, metoprolol, Imdur and Lipitor. BP is controlled.   2. Systolic HF/ Ischemic Cardiomyopathy: EF 25%. She is euvolemic on physical exam. Her weight has remained stable since discharge. BP and HR stable. She is on BB, however her metoprolol was decreased during recent hospitalization given 2.2 sec pause on telemetry. We will not further titrate.  She is on an ARB and nitrate. We discussed importance of daily weights. Low sodium diet advised. Continue Lasix.  3. CKD: followed by nephrology. She has f/u early next week.   4. HLD: on statin therapy with Lipitor.   PLAN  F/u with Dr. Angelena Form in 6 weeks. However, if patient decides to consider cath, after discussion with her nephrologist, she will call back to schedule a sooner appointment with Dr. Angelena Form or an APP on a day that he is in the office.   Brittainy Simmons PA-C 03/09/2016 11:51 AM

## 2016-03-09 NOTE — Patient Instructions (Addendum)
Medication Instructions:   Your physician recommends that you continue on your current medications as directed. Please refer to the Current Medication list given to you today.   If you need a refill on your cardiac medications before your next appointment, please call your pharmacy.  Labwork: NONE ORDERED  TODAY    Testing/Procedures: NONE ORDERED  TODAY    Follow-Up: IN 6 WEEKS WITH DR MCALHANY OR APP ON SAME DAY MCALHANY IN OFFICE    Any Other Special Instructions Will Be Listed Below (If Applicable).

## 2016-03-11 ENCOUNTER — Encounter (HOSPITAL_COMMUNITY): Payer: Medicare HMO

## 2016-03-15 ENCOUNTER — Ambulatory Visit (HOSPITAL_COMMUNITY)
Admission: RE | Admit: 2016-03-15 | Discharge: 2016-03-15 | Disposition: A | Discharge status: Home / Self Care | Payer: Medicare HMO | Source: Ambulatory Visit | Attending: Surgery | Admitting: Surgery

## 2016-03-15 ENCOUNTER — Other Ambulatory Visit (HOSPITAL_COMMUNITY): Payer: Self-pay | Admitting: Nephrology

## 2016-03-15 DIAGNOSIS — Z95828 Presence of other vascular implants and grafts: Secondary | ICD-10-CM | POA: Diagnosis not present

## 2016-03-15 DIAGNOSIS — I701 Atherosclerosis of renal artery: Secondary | ICD-10-CM

## 2016-03-26 ENCOUNTER — Ambulatory Visit: Payer: Commercial Managed Care - HMO | Admitting: Family

## 2016-03-26 ENCOUNTER — Encounter (HOSPITAL_COMMUNITY): Payer: Commercial Managed Care - HMO

## 2016-03-26 DIAGNOSIS — I701 Atherosclerosis of renal artery: Secondary | ICD-10-CM | POA: Diagnosis not present

## 2016-03-26 DIAGNOSIS — N183 Chronic kidney disease, stage 3 (moderate): Secondary | ICD-10-CM | POA: Diagnosis not present

## 2016-03-26 DIAGNOSIS — I129 Hypertensive chronic kidney disease with stage 1 through stage 4 chronic kidney disease, or unspecified chronic kidney disease: Secondary | ICD-10-CM | POA: Diagnosis not present

## 2016-03-26 DIAGNOSIS — D631 Anemia in chronic kidney disease: Secondary | ICD-10-CM | POA: Diagnosis not present

## 2016-03-26 DIAGNOSIS — N2581 Secondary hyperparathyroidism of renal origin: Secondary | ICD-10-CM | POA: Diagnosis not present

## 2016-04-01 ENCOUNTER — Encounter: Payer: Self-pay | Admitting: Cardiovascular Disease

## 2016-04-05 ENCOUNTER — Other Ambulatory Visit: Payer: Self-pay

## 2016-04-05 ENCOUNTER — Other Ambulatory Visit: Payer: Self-pay | Admitting: *Deleted

## 2016-04-05 MED ORDER — METOPROLOL SUCCINATE ER 25 MG PO TB24: 25.0000 mg | ORAL_TABLET | Freq: Every day | ORAL | 10 refills | Status: DC

## 2016-04-05 MED ORDER — ISOSORBIDE MONONITRATE ER 30 MG PO TB24: 30.0000 mg | ORAL_TABLET | Freq: Every day | ORAL | 10 refills | Status: DC

## 2016-04-05 NOTE — Patient Outreach (Signed)
[facility]) Care Management  04/05/2016  Kristen Bradley [birth year] [id number]   Emmi Consult:  Heart Failure  Referral Date:  04/02/16 Source:  Emmi HF Red Alert: weighed themselves today.  [place] [employer] List:  Yes   [place] [id number] [phone] (H)  - No Answer following multiple rings.  [phone] (M)  - Voice Mail box has not been set up yet.  Outreach call #1 to patient.  Patient not reached.  RN CM scheduled for next outreach call within one week.   Nathaneil Canary, BSN, RN, [place] Care Management Care Management Coordinator [phone] Direct [phone] Cell [phone] Office [phone] Fax [email or web address]

## 2016-04-06 ENCOUNTER — Other Ambulatory Visit: Payer: Self-pay

## 2016-04-06 NOTE — Patient Outreach (Addendum)
[facility]) Care Management  04/06/2016  Kristen Bradley 08/01/1945 [id number]   Emmi Consult:  Heart Failure and Screening   Referral Date:  04/02/16 Source:  Emmi HF Red Alert: weighed themselves today.  [facility] All Payor List:  Yes  Case Review:   Providers:  PCP:  Dr. [name] Cardiologist:  Dr. Angelena Form Nephrologist: Dr. Fleet Contras, [facility] Admission: (2) over past 6 months  -03/03/16 - 03/05/16 - Acute on chronic systolic heart failure, Dyspnea, CKD (chronic kidney disease) stage 4, GFR 15-29 ml/min, CAD, multiple vessel.  H/o discharge weight 123 lbs -01/29/16 - 02/04/16  - Acute respiratory failure, suspected lobar pneumonia.   Co-morbidities:  DM type II, Hepatitis C, HTN, Stroke (2014), renal artery stenosis with stent (2008), HLD, severe 3 vessel CAD.   Per [facility] BP 124/40 03/09/2016 Weight 125 lb (57 kg) 03/09/2016 Height 64 in (163 cm) 03/09/2016 BMI 21.50 (Normal) 03/09/2016  Lipid Panel completed 02/26/2016 HDL 38.000 02/26/2016 LDL 98.000 02/26/2016 Cholesterol, total 163.000 02/26/2016 Triglycerides 137.000 02/26/2016 A1C 6.100 01/06/2016 Glucose Random 116.000 03/05/2016 MicroAlbumin Urine 154.780 08/13/2015 MicroAlbumin/Creat 1864.800 08/13/2015 BUN 28.000 03/05/2016 Creatinine, Serum 2.060 03/05/2016 TSH 0.978 01/29/2016  Medications:  Prevnar (PCV13) N/D Pneumovax (PPS N/D Flu Vaccine N/D tDAP Vaccine N/D  Preventives:  Endo - DM Eye Exam: negative retino 12/19/2014 Endo - DM Foot Exam 08/13/2015 Colonoscopy 09/24/2013 Mammogram 09/29/2015  Outreach call #2 to patient.  Patient reached. [place] [address] [phone] (H)  - Patient reached and states "call me back tomorrow; I am asleep".  [phone] (M)     Plan:  RN CM scheduled for next outreach call within one week.   Nathaneil Canary, BSN, RN, [place] Management Care Management  Coordinator [phone] Direct [phone] Cell [phone] Office [phone] Fax [email or web address]

## 2016-04-07 ENCOUNTER — Ambulatory Visit: Payer: Self-pay

## 2016-04-07 NOTE — Progress Notes (Signed)
Chief Complaint  Patient presents with  . Follow-up    History of Present Illness: 71 yo AAF with history of CAD, ischemic cardiomyopathy, systolic CHF, tobacco abuse, bilateral carotid artery stenosis, renal artery stenosis s/p stenting right renal artery, PAD,  hepatitis C, HTN, HLD, DM who is here today for cardiac follow up. I saw her 11/30/11 for pre-operative evaluation prior to planned carotid endarterectomy with Dr. [name] with [facility]. She had a stress test December 2012 in [facility] that was reported as normal. I arranged an echo 12/07/11 which showed normal LV and RV size and function with no significant valvular issues. Her carotid artery disease, renal artery stenosis and LE PAD is followed by Dr. [name] in the [facility] office. She was [facility] to [facility] December 2018 with volume overload, dyspnea. Cardiac cath 02/03/16 with severe 3 vessel CAD with severe calcification of all vessels. Echo with LVEF=25%, mild MR. She was not felt to be a candidate for CABG or PCI. She was readmitted February 2018 with acute CHF exacerbation. She was diuresed and had improvement in symptoms. She was last seen in our office 03/09/16 by Lyda Jester, PA-C and was doing well.   She is here today for follow up. No chest pain or SOB. No LE edema. She is taking Lasix 80 mg BID. She saw Dr. Mercy Moore recently. Renal function is stable. She has been feeling well.    Primary Care Physician: Cammy Copa, MD Vascular Surgery: [name]  Past Medical History:  Diagnosis Date  . Adrenal tumor 08/2007   surgery  . Cancer of right breast [facility])    s/p lumpectomy, XRT  . Carotid artery disease (HCC)    s/p bilateral CEA  . Hepatitis C    "got treatment for it" (01/29/2016)  . High cholesterol    takes Lopid daily  . History of UTI    takes Diflucan dail--pt states no uti in a couple of yrs though  . Hypertension    takes Losartan,Amlodipine,HCTZ,and Atenolol daily and Clatarpres  . Joint pain    legs  .  Renal artery stenosis (HCC)    Right renal artery stent 05/31/06  . Stroke [facility]) Jan. 7, 2014  . Tobacco abuse   . Type II diabetes mellitus ([place])     Past Surgical History:  Procedure Laterality Date  . ABDOMINAL HYSTERECTOMY    . ADRENAL GLAND SURGERY  2009  . BREAST LUMPECTOMY Right 2009  . CARDIAC CATHETERIZATION N/A 02/03/2016   Procedure: Right/Left Heart Cath and Coronary Angiography;  Surgeon: Burnell Blanks, MD;  Location: [place] CV LAB;  Service: Cardiovascular;  Laterality: N/A;  . ENDARTERECTOMY  01/05/2012   Procedure: ENDARTERECTOMY CAROTID;  Surgeon: Conrad Vinton, MD;  Location: [place];  Service: Vascular;  Laterality: Right;  Right Carotid Artery Endarterectomy, right stump pressure, intraoperative ultrasound  . ENDARTERECTOMY  02/08/2012   Procedure: ENDARTERECTOMY CAROTID;  Surgeon: Conrad Rifton, MD;  Location: [place];  Service: Vascular;  Laterality: Left;  . LUMBAR DISC SURGERY    . PATCH ANGIOPLASTY  02/08/2012   Procedure: PATCH ANGIOPLASTY;  Surgeon: Conrad [place], MD;  Location: [facility] OR;  Service: Vascular;  Laterality: Left;  Carotid artery patch angioplasty using Vascu-Guard bovine patch 1cm x 6cm.  . RENAL ARTERY STENT Right 05/2006    Current Outpatient Prescriptions  Medication Sig Dispense Refill  . amLODipine (NORVASC) 10 MG tablet Take 10 mg by mouth daily.     Marland Kitchen aspirin EC 81 MG tablet  Take 81 mg by mouth daily.      Marland Kitchen atorvastatin (LIPITOR) 80 MG tablet Take 1 tablet (80 mg total) by mouth daily. 90 tablet 3  . cloNIDine (CATAPRES) 0.1 MG tablet Take 0.1 mg by mouth 2 (two) times daily.      . clopidogrel (PLAVIX) 75 MG tablet Take 1 tablet (75 mg total) by mouth daily. 30 tablet 11  . furosemide (LASIX) 80 MG tablet Take 1 tablet (80 mg total) by mouth 2 (two) times daily. 60 tablet 3  . isosorbide mononitrate (IMDUR) 30 MG 24 hr tablet Take 1 tablet (30 mg total) by mouth daily. 60 tablet 10  . losartan (COZAAR) 100 MG tablet Take 1 tablet by mouth  daily.    . metoprolol succinate (TOPROL-XL) 25 MG 24 hr tablet Take 1 tablet (25 mg total) by mouth daily. 60 tablet 10  . nitroGLYCERIN (NITROSTAT) 0.4 MG SL tablet Place 1 tablet (0.4 mg total) under the tongue every 5 (five) minutes as needed for chest pain. 25 tablet 3  . polyethylene glycol (MIRALAX / GLYCOLAX) packet Take 17 g by mouth daily as needed for mild constipation. 14 each 0  . traMADol (ULTRAM) 50 MG tablet Take 1 tablet by mouth as directed.     No current facility-administered medications for this visit.     Allergies  Allergen Reactions  . Chlorhexidine Gluconate Itching  . Ace Inhibitors Cough  . Dilaudid [Hydromorphone Hcl] Itching    Social History   Social History  . Marital status: Divorced    Spouse name: N/A  . Number of children: 1  . Years of education: N/A   Occupational History  . Retired-truck driver Retired   Social History Main Topics  . Smoking status: Current Every Day Smoker    Packs/day: 0.50    Years: 53.00    Types: Cigarettes  . Smokeless tobacco: Never Used  . Alcohol use No  . Drug use: No  . Sexual activity: Not on file   Other Topics Concern  . Not on file   Social History Narrative  . No narrative on file    Family History  Problem Relation Age of Onset  . Cancer Mother     ? type  . Cancer Father     ? type  . Diabetes Sister   . Hypertension Sister   . Diabetes Brother   . Hypertension Brother   . CAD Neg Hx     Review of Systems:  As stated in the HPI and otherwise negative.   BP 130/60   Pulse 66   Ht 5\' 5"  (1.651 m)   Wt 125 lb 1.9 oz (56.8 kg)   BMI 20.82 kg/m   Physical Examination: General: Well developed, well nourished, NAD  HEENT: OP clear, mucus membranes moist  SKIN: warm, dry. No rashes. Neuro: No focal deficits  Musculoskeletal: Muscle strength 5/5 all ext  Psychiatric: Mood and affect normal  Neck: No JVD, no carotid bruits, no thyromegaly, no lymphadenopathy.  Lungs:Clear  bilaterally, no wheezes, rhonci, crackles Cardiovascular: Regular rate and rhythm. Systolic murmur. No gallops or rubs. Abdomen:Soft. Bowel sounds present. Non-tender.  Extremities: No lower extremity edema. Pulses are non-palpable in the bilateral DP/PT.  Echo 01/30/16: Left ventricle: The cavity size was normal. Wall thickness was   increased in a pattern of mild LVH. The estimated ejection   fraction was 25%. Diffuse severe hypokinesis with septal-lateral   dyssynchrony. Doppler parameters are consistent with restrictive   physiology,  indicative of decreased left ventricular diastolic   compliance and/or increased left atrial pressure. - Aortic valve: There was no stenosis. - Mitral valve: There was mild to moderate regurgitation. - Left atrium: The atrium was moderately dilated. - Right ventricle: The cavity size was normal. Systolic function   was mildly reduced. - Tricuspid valve: Peak RV-RA gradient (S): 44 mm Hg. - Pulmonary arteries: PA peak pressure: 59 mm Hg (S). - Systemic veins: IVC measured 2.1 cm with < 50% respirophasic   variation, suggesting RA pressure 15 mmHg. - Pericardium, extracardiac: A trivial pericardial effusion was   identified posterior to the heart.  Impressions:  - Normal LV size with mild LV hypertrophy. EF 25% with severe   diffuse hypokinesis and septal-lateral dyssynchrony. Restrictive   diastolic function. Normal RV size with mildly decreased systolic   function. Moderate pulmonary hypertension. Mild to moderate MR.   Dilated IVC suggestive of elevated RV filling pressure.  Cardiac cath 02/03/16:  Ost RCA to Prox RCA lesion, 60 %stenosed.  Mid RCA lesion, 80 %stenosed.  Dist RCA lesion, 60 %stenosed.  RPDA lesion, 99 %stenosed.  Ost LM lesion, 50 %stenosed.  Ost Cx to Prox Cx lesion, 70 %stenosed.  Ost 3rd Mrg to 3rd Mrg lesion, 99 %stenosed.  2nd Mrg lesion, 50 %stenosed.  Ost 2nd Mrg to 2nd Mrg lesion, 20 %stenosed.  Prox Cx to  Mid Cx lesion, 20 %stenosed.  Mid LAD lesion, 90 %stenosed.  1st Diag lesion, 90 %stenosed.  Dist LAD lesion, 80 %stenosed.  LV end diastolic pressure is normal.  Mid LAD to Dist LAD lesion, 20 %stenosed.  Ost LAD to Prox LAD lesion, 20 %stenosed.   1. Severe calcific three vessel CAD 2. Normal filling pressures 3. Ischemic cardiomyopathy (LV function reduced by echo)  Recommendations: She has severe calcification in all three vessels. Her anatomy is not favorable for PCI. I will ask CT surgery to see her to discuss CABG.  Diagnostic Diagram        EKG:  EKG is not ordered today. The ekg ordered today demonstrates   Recent Labs: 01/29/2016: TSH 0.978 01/30/2016: ALT 14 03/03/2016: B Natriuretic Peptide 3,214.8; Hemoglobin 9.8; Platelets 121 03/05/2016: BUN 28; Creatinine, Ser 2.06; Potassium 3.3; Sodium 142   Lipid Panel    Component Value Date/Time   CHOL 163 02/26/2016 1148   TRIG 137 02/26/2016 1148   HDL 38 (L) 02/26/2016 1148   CHOLHDL 4.3 02/26/2016 1148   LDLCALC 98 02/26/2016 1148     Wt Readings from Last 3 Encounters:  04/08/16 125 lb 1.9 oz (56.8 kg)  03/09/16 124 lb 12.8 oz (56.6 kg)  03/05/16 123 lb (55.8 kg)     Other studies Reviewed: Additional studies/ records that were reviewed today include: . Review of the above records demonstrates:   Assessment and Plan:   1. CAD without angina: She has no chest pain suggestive of angina. She has severe, diffuse calcific CAD. She is not felt to have favorable anatomy for PCI and given the extent of her aortic calcification, she is not felt to be a good candidate for CABG. Her disease involves multiple segments of all arteries and focal stent placement would not be of benefit. Any intervention would require a large amount of contrast dye, likely leading to worsening of her renal function. Will continue medical management with ASA, Plavix, Imdur, beta blocker, Norvasc, ARB and statin.  2. Ischemic  cardiomyopathy: LVEF=25% by echo December 2017. Will continue ARB, beta blocker. Since she is  not a candidate for revascularization, will consider ICD placement.   3. Chronic systolic CHF:  Volume status is ok. Continue Lasix.   4. Tobacco abuse, in remission: She has stopped smoking.   5. PAD: Severe disease in both legs and bilateral carotid arteries. Also with renal artery stenosis. Following in [facility] by Dr. [name].   Current medicines are reviewed at length with the patient today.  The patient does not have concerns regarding medicines.  The following changes have been made:  no change  Labs/ tests ordered today include:  No orders of the defined types were placed in this encounter.  Disposition:   FU with md in 6 months  Signed, [name], MD 04/08/2016 12:24 PM    [facility] [place], [place], [place]  [address] Phone: [phone]; Fax: [phone]

## 2016-04-08 ENCOUNTER — Other Ambulatory Visit: Payer: Self-pay

## 2016-04-08 ENCOUNTER — Ambulatory Visit (INDEPENDENT_AMBULATORY_CARE_PROVIDER_SITE_OTHER): Payer: Medicare HMO | Admitting: Cardiovascular Disease

## 2016-04-08 VITALS — BP 130/60 | HR 66 | Ht 65.0 in | Wt 125.1 lb

## 2016-04-08 DIAGNOSIS — F17201 Nicotine dependence, unspecified, in remission: Secondary | ICD-10-CM | POA: Diagnosis not present

## 2016-04-08 DIAGNOSIS — I5022 Chronic systolic (congestive) heart failure: Secondary | ICD-10-CM

## 2016-04-08 DIAGNOSIS — I255 Ischemic cardiomyopathy: Secondary | ICD-10-CM | POA: Diagnosis not present

## 2016-04-08 DIAGNOSIS — I701 Atherosclerosis of renal artery: Secondary | ICD-10-CM

## 2016-04-08 NOTE — Patient Outreach (Addendum)
[facility]) Care Management  04/08/2016  Kristen Bradley [birth year] [id number]   Emmi Consult:  Heart Failure and Screening   Referral Date:  04/02/16 Source:  Emmi HF Red Alert: weighed themselves today.  [facility] All Payor List:  Yes  Subjective:  Outreach call #3 to patient.  Patient reached and completed call.  [address]  [name]   Patient states she is doing well and has no needs.  States her weight is 125 lbs today.  Patient denies need for Telephonic services or [employer].   DME:  CBG meter, scales  Providers:  PCP:  Dr. [name] Cardiologist:  Dr. Angelena Form Nephrologist: Dr. Fleet Contras, [facility] Admission: (2) over past 6 months  -03/03/16 - 03/05/16 - Acute on chronic systolic heart failure, Dyspnea, CKD (chronic kidney disease) stage 4, GFR 15-29 ml/min, CAD, multiple vessel.  H/o discharge weight 123 lbs -01/29/16 - 02/04/16  - Acute respiratory failure, suspected lobar pneumonia.  Co-morbidities:  DM type II, Hepatitis C, HTN, Stroke (2014), renal artery stenosis with stent (2008), HLD, severe 3 vessel CAD.   BP 124/40 03/09/2016 Weight 125 lb (57 kg) 03/09/2016  - 125.   Height 64 in (163 cm) 03/09/2016 BMI 21.50 (Normal) 03/09/2016  Lipid Panel completed 02/26/2016 HDL 38.000 02/26/2016 LDL 98.000 02/26/2016 Cholesterol, total 163.000 02/26/2016 Triglycerides 137.000 02/26/2016 A1C 6.100 01/06/2016 Glucose Random 116.000 03/05/2016 MicroAlbumin Urine 154.780 08/13/2015 MicroAlbumin/Creat 1864.800 08/13/2015 BUN 28.000 03/05/2016 Creatinine, Serum 2.060 03/05/2016 TSH 0.978 01/29/2016  Preventives:  Endo - DM Eye Exam: negative retino 12/19/2014 Endo - DM Foot Exam 08/13/2015 Colonoscopy 09/24/2013 Mammogram 09/29/2015   Assessment and Plan:  Referral Date:  04/02/16 Source:  Emmi HF Red Alert: weighed themselves today.  [facility] All Payor List:  Yes Emmi Consult:   Heart Failure and Screening  04/08/16  Patient would benefit from [facility] CHF education and management; however declines telephonic or community services.  Agreed to [facility] HF calls only.   Case closed.  PCP notified [place] notified.   Nathaneil Canary, BSN, RN, [place] Management Care Management Coordinator [phone] Direct [phone] Cell [phone] Office [phone] Fax [email or web address]

## 2016-04-08 NOTE — Patient Instructions (Signed)

## 2016-04-09 ENCOUNTER — Other Ambulatory Visit: Payer: Self-pay

## 2016-04-09 NOTE — Patient Outreach (Signed)
[facility]) Care Management  04/09/2016  Kristen Bradley 08-12-1945 [id number]   [name]  Referral Date:  04/09/16 Source:  Emmi Red Alert  Issue:  3 red flags today for any new/worsening problems and other symptoms/problems.   APL: yes  Last [facility] Screening date 04/08/16. H/o Patient would benefit from [facility] CHF education and management; however declines telephonic or community services.  Agreed to [facility] HF calls only.  Outreach call #1 to patient regarding new Emmi Red Alert.  Patient not reached.  RN CM scheduled for next outreach call within one week.   Nathaneil Canary, BSN, RN, [place] Management Care Management Coordinator [phone] Direct [phone] Cell [phone] Office [phone] Fax [email or web address]

## 2016-04-12 ENCOUNTER — Other Ambulatory Visit: Payer: Medicare HMO | Admitting: *Deleted

## 2016-04-12 ENCOUNTER — Other Ambulatory Visit: Payer: Self-pay

## 2016-04-12 DIAGNOSIS — E785 Hyperlipidemia, unspecified: Secondary | ICD-10-CM | POA: Diagnosis not present

## 2016-04-12 LAB — LIPID PANEL
CHOL/HDL RATIO: 3.2 ratio (ref 0.0–4.4)
Cholesterol, Total: 129 mg/dL (ref 100–199)
HDL: 40 mg/dL (ref >39)
LDL Calculated: 62 mg/dL (ref 0–99)
Triglycerides: 134 mg/dL (ref 0–149)
VLDL CHOLESTEROL CAL: 27 mg/dL (ref 5–40)

## 2016-04-12 LAB — HEPATIC FUNCTION PANEL
ALBUMIN: 4.2 g/dL (ref 3.6–4.8)
ALT: 95 IU/L — ABNORMAL HIGH (ref 0–32)
AST: 255 IU/L — AB (ref 0–40)
Alkaline Phosphatase: 85 IU/L (ref 39–117)
BILIRUBIN TOTAL: 0.4 mg/dL (ref 0.0–1.2)
BILIRUBIN, DIRECT: 0.17 mg/dL (ref 0.00–0.40)
Total Protein: 6.5 g/dL (ref 6.0–8.5)

## 2016-04-12 NOTE — Addendum Note (Signed)
Addended by: Eulis Foster on: 04/12/2016 08:39 AM   Modules accepted: Orders

## 2016-04-12 NOTE — Patient Outreach (Signed)
[name] Care Management  04/12/2016  Kristen Bradley 07/05/1945 [id number]   [name]  Referral Date:  04/09/16 Source:  Emmi Red Alert  Issue:  3 red flags today for any new/worsening problems and other symptoms/problems.   APL: yes  Last [facility] Screening date 04/08/16. H/o Patient would benefit from [facility] CHF education and management; however declines telephonic or community services.  Agreed to [facility] HF calls only.  Outreach call #2 to patient regarding new Emmi Red Alert.  Patient not reached.  Kristen Bradley CM scheduled for next outreach call within one week.   Kristen Bradley, BSN, Kristen Bradley, Kristen Bradley Management Care Management Coordinator [phone] Direct [phone] Cell [phone] Office [phone] Fax [email or web address]

## 2016-04-13 ENCOUNTER — Other Ambulatory Visit: Payer: Self-pay

## 2016-04-13 ENCOUNTER — Other Ambulatory Visit: Payer: Medicare HMO

## 2016-04-13 NOTE — Patient Outreach (Signed)
[facility]) Care Management  04/13/2016  Kristen Bradley 12/22/1945 [id number]   Emmi   Referral Date:  04/13/16 Source:  Emmi Red Alert  Issue:  3 red flags today for any new/worsening problems and other symptoms/problems.   APL: yes  Last [facility] Screening date 04/08/16. H/o Patient would benefit from [facility] CHF education and management; however declines telephonic or community services.  Agreed to [facility] calls only.  Outreach call #3 to patient regarding new Emmi Red Alert.  Patient not reached [place] [address] [phone] (H)  - no answer follwing multiple rings.  [phone] (M)  - no answer and per automation "voice mail has not been set up yet" RN CM mailed unsuccessful outreach letter and will close case if no response received back within 2 weeks.   Nathaneil Canary, BSN, RN, [place] Care Management Care Management Coordinator [phone] Direct [phone] Cell [phone] Office [phone] Fax [email or web address]

## 2016-04-14 ENCOUNTER — Encounter: Payer: Self-pay | Admitting: Family

## 2016-04-16 ENCOUNTER — Other Ambulatory Visit: Payer: Self-pay | Admitting: *Deleted

## 2016-04-16 DIAGNOSIS — R7989 Other specified abnormal findings of blood chemistry: Secondary | ICD-10-CM

## 2016-04-16 DIAGNOSIS — R945 Abnormal results of liver function studies: Principal | ICD-10-CM

## 2016-04-19 ENCOUNTER — Other Ambulatory Visit: Payer: Self-pay

## 2016-04-19 NOTE — Patient Outreach (Signed)
[facility]) Care Management  04/19/2016  Kristen Bradley July 07, 1945 [id number]   Emmi   Referral Date:  04/13/16 Source:  Emmi Red Alert  Issue:  3 red flags today for any new/worsening problems and other symptoms/problems.   APL: yes  Last [facility] Screening date 04/08/16. H/o Patient would benefit from [facility] CHF education and management; however declines telephonic or community services.  Agreed to [facility] calls only.  H/o 3 unsuccessful attempted calls to patient regarding Emmi Red Alert.  [place] [address] [phone] (H)  - no answer follwing multiple rings.  [phone] (M)  - no answer and per automation "voice mail has not been set up yet" RN CM mailed unsuccessful outreach letter 04/13/16 and will close case if no response received back within 2 weeks (by 04/27/16).  New Emmi Referral received 04/19/16 for weighed themselves today? No.  RN CM attempted another outreach #4.  No answer.    Plan: RN CM mailed unsuccessful outreach letter 04/13/16 and will close case if no response received back within 2 weeks (by 04/27/16).   Nathaneil Canary, BSN, RN, [place] Care Management Care Management Coordinator [phone] Direct [phone] Cell [phone] Office [phone] Fax [email or web address]

## 2016-04-20 ENCOUNTER — Ambulatory Visit: Payer: Medicare HMO | Admitting: Cardiology

## 2016-04-23 ENCOUNTER — Other Ambulatory Visit: Payer: Self-pay

## 2016-04-23 NOTE — Patient Outreach (Signed)
[facility]) Care Management  04/23/2016  Kristen Bradley 1945/04/16 [id number]   Emmi HF  Referral Date:  04/23/16 Source:  Emmi Red Alert  Issue:  red flags APL: yes  H/o Patient would benefit from [facility] CHF education and management; however declines telephonic or community services.  Agreed to [facility] calls only.  [address]   [place] [address] [phone] [name] (M)    Patient states she is keeping her MD appt's.  States next PCP appt is due June 2018.  States she does not weigh in the mornings but in the evening hours.  States she is continuing to feel better.  Patient is unreceptive to [facility] services for additional education on her condition.    Encounter Medications:  Outpatient Encounter Prescriptions as of 04/23/2016  Medication Sig  . amLODipine (NORVASC) 10 MG tablet Take 10 mg by mouth daily.   Marland Kitchen aspirin EC 81 MG tablet Take 81 mg by mouth daily.    . cloNIDine (CATAPRES) 0.1 MG tablet Take 0.1 mg by mouth 2 (two) times daily.    . clopidogrel (PLAVIX) 75 MG tablet Take 1 tablet (75 mg total) by mouth daily.  . furosemide (LASIX) 80 MG tablet Take 1 tablet (80 mg total) by mouth 2 (two) times daily.  . isosorbide mononitrate (IMDUR) 30 MG 24 hr tablet Take 1 tablet (30 mg total) by mouth daily.  Marland Kitchen losartan (COZAAR) 100 MG tablet Take 1 tablet by mouth daily.  . metoprolol succinate (TOPROL-XL) 25 MG 24 hr tablet Take 1 tablet (25 mg total) by mouth daily.  . nitroGLYCERIN (NITROSTAT) 0.4 MG SL tablet Place 1 tablet (0.4 mg total) under the tongue every 5 (five) minutes as needed for chest pain.  . polyethylene glycol (MIRALAX / GLYCOLAX) packet Take 17 g by mouth daily as needed for mild constipation.  . traMADol (ULTRAM) 50 MG tablet Take 1 tablet by mouth as directed.   No facility-administered encounter medications on file as of 04/23/2016.     Functional Status:  In your present state of health, do you have any difficulty performing  the following activities: 04/08/2016 03/03/2016  Hearing? N N  Vision? N N  Difficulty concentrating or making decisions? N N  Walking or climbing stairs? N N  Dressing or bathing? N N  Doing errands, shopping? N N  Preparing Food and eating ? N -  Using the Toilet? N -  In the past six months, have you accidently leaked urine? N -  Do you have problems with loss of bowel control? N -  Managing your Medications? N -  Managing your Finances? N -  Housekeeping or managing your Housekeeping? N -  Some recent data might be hidden    Fall/Depression Screening: PHQ 2/9 Scores 04/23/2016 04/08/2016  PHQ - 2 Score 0 0    Fall Risk  04/23/2016 04/08/2016  Falls in the past year? No No      Plan: Case closed [facility] notified.  Physician case closure letter sent 04/23/2016.   Nathaneil Canary, BSN, RN, [place] Care Management Care Management Coordinator [phone] Direct [phone] Cell [phone] Office [phone] Fax [email or web address]

## 2016-04-27 ENCOUNTER — Ambulatory Visit: Payer: Self-pay

## 2016-04-27 ENCOUNTER — Ambulatory Visit (INDEPENDENT_AMBULATORY_CARE_PROVIDER_SITE_OTHER): Payer: Medicare HMO | Admitting: Family

## 2016-04-27 ENCOUNTER — Encounter: Payer: Self-pay | Admitting: Family

## 2016-04-27 ENCOUNTER — Ambulatory Visit (HOSPITAL_COMMUNITY)
Admission: RE | Admit: 2016-04-27 | Discharge: 2016-04-27 | Disposition: A | Discharge status: Home / Self Care | Payer: Medicare HMO | Source: Ambulatory Visit | Attending: Family | Admitting: Family

## 2016-04-27 VITALS — BP 112/62 | HR 61 | Temp 98.3°F | Resp 16 | Ht 65.0 in | Wt 126.0 lb

## 2016-04-27 DIAGNOSIS — Z9889 Other specified postprocedural states: Secondary | ICD-10-CM | POA: Diagnosis not present

## 2016-04-27 DIAGNOSIS — I6523 Occlusion and stenosis of bilateral carotid arteries: Secondary | ICD-10-CM

## 2016-04-27 DIAGNOSIS — Z87891 Personal history of nicotine dependence: Secondary | ICD-10-CM | POA: Diagnosis not present

## 2016-04-27 DIAGNOSIS — I70203 Unspecified atherosclerosis of native arteries of extremities, bilateral legs: Secondary | ICD-10-CM | POA: Diagnosis not present

## 2016-04-27 DIAGNOSIS — I70213 Atherosclerosis of native arteries of extremities with intermittent claudication, bilateral legs: Secondary | ICD-10-CM | POA: Diagnosis not present

## 2016-04-27 DIAGNOSIS — E1151 Type 2 diabetes mellitus with diabetic peripheral angiopathy without gangrene: Secondary | ICD-10-CM

## 2016-04-27 DIAGNOSIS — I739 Peripheral vascular disease, unspecified: Secondary | ICD-10-CM | POA: Diagnosis not present

## 2016-04-27 NOTE — Progress Notes (Signed)
[facility] [place] HISTORY AND PHYSICAL   MRN : [id number]  History of Present Illness:   Kristen Bradley is a 71 y.o. female patient of Dr. [name] who is s/p right CEA on 01/05/2012, and left CEA using patch angioplasty Vascu-Guard bovine patch 1cm x 6cmon 02/08/2012.  She also has PAD. She returns today for follow up.  The patient has no hx of amaurosis fugax or monocular blindness, hemiplegia, or receptive or expressive aphasia. The patient previously had some L corner of mouth droop after her R CEA, which has since resolved.  She continues to have some non-lifestyle limiting intermittent claudication. She is not doing a walking plan. She notes some intermittent Left lower leg numbness without other radicular sx.   At her visit with Dr. [name] on 01/26/2013 his assessment was the following:  Her sx pattern for her Left leg and ABI are not consistent with L PAD as the etiology for her sx. I would consider spinal evaluation with +/- EMG/NCV studies. The patient needs to stop smoking otherwise her carotid arteries are likely to restenose and her PAD will progress with time. The pt's prior left corner of mouth drooping is NOT consistent with marginal mandibular drooping as it was CONTRALATERAL to the R CEA, suggesting possible small contralateral CVA.   Pt denies any new stroke or TIA symptoms.  She denies steal symptoms in either upper extremity, denies dizziness. She complains of bilateral calf/thigh/buttocks pain in about 5 minutes with walking at times, left calf worse than than right.   She has CKD. Her last eGFR result on file was 27 on 03-05-16, serum creatinine of 2.06, stage 4 CKD.   She has no hx of CHF. Pt denies non healing wounds.   Pt Diabetic: Yes, states she does not recall her last A1C, not on file Pt smoker: smoker until February 2018 (1/3 ppd, started smoking at about age 8), states she stopped several times  Pt meds include:  Statin  :no Betablocker: Yes ASA: Yes Other anticoagulants/antiplatelets: Plavix, Pletal stopped by either her cardiologist or nephrologist, per pt and son   Current Outpatient Prescriptions  Medication Sig Dispense Refill  . amLODipine (NORVASC) 10 MG tablet Take 10 mg by mouth daily.     Marland Kitchen aspirin EC 81 MG tablet Take 81 mg by mouth daily.      . cloNIDine (CATAPRES) 0.1 MG tablet Take 0.1 mg by mouth 2 (two) times daily.      . clopidogrel (PLAVIX) 75 MG tablet Take 1 tablet (75 mg total) by mouth daily. 30 tablet 11  . furosemide (LASIX) 80 MG tablet Take 1 tablet (80 mg total) by mouth 2 (two) times daily. 60 tablet 3  . isosorbide mononitrate (IMDUR) 30 MG 24 hr tablet Take 1 tablet (30 mg total) by mouth daily. 60 tablet 10  . losartan (COZAAR) 100 MG tablet Take 1 tablet by mouth daily.    . metoprolol succinate (TOPROL-XL) 25 MG 24 hr tablet Take 1 tablet (25 mg total) by mouth daily. 60 tablet 10  . nitroGLYCERIN (NITROSTAT) 0.4 MG SL tablet Place 1 tablet (0.4 mg total) under the tongue every 5 (five) minutes as needed for chest pain. 25 tablet 3  . polyethylene glycol (MIRALAX / GLYCOLAX) packet Take 17 g by mouth daily as needed for mild constipation. 14 each 0  . traMADol (ULTRAM) 50 MG tablet Take 1 tablet by mouth as directed.     No current facility-administered medications for this  visit.     Past Medical History:  Diagnosis Date  . Adrenal tumor 08/2007   surgery  . Cancer of right breast [facility])    s/p lumpectomy, XRT  . Carotid artery disease (HCC)    s/p bilateral CEA  . Hepatitis C    "got treatment for it" (01/29/2016)  . High cholesterol    takes Lopid daily  . History of UTI    takes Diflucan dail--pt states no uti in a couple of yrs though  . Hypertension    takes Losartan,Amlodipine,HCTZ,and Atenolol daily and Clatarpres  . Joint pain    legs  . Renal artery stenosis (HCC)    Right renal artery stent 05/31/06  . Stroke [facility]) Jan. 7, 2014  . Tobacco  abuse   . Type II diabetes mellitus ([place])     Social History Social History  Substance Use Topics  . Smoking status: Former Smoker    Years: 53.00    Types: Cigarettes    Quit date: 03/2016  . Smokeless tobacco: Never Used  . Alcohol use No    Family History Family History  Problem Relation Age of Onset  . Cancer Mother     ? type  . Cancer Father     ? type  . Diabetes Sister   . Hypertension Sister   . Diabetes Brother   . Hypertension Brother   . CAD Neg Hx     Surgical History Past Surgical History:  Procedure Laterality Date  . ABDOMINAL HYSTERECTOMY    . ADRENAL GLAND SURGERY  2009  . BREAST LUMPECTOMY Right 2009  . CARDIAC CATHETERIZATION N/A 02/03/2016   Procedure: Right/Left Heart Cath and Coronary Angiography;  Surgeon: Burnell Blanks, MD;  Location: [place] [facility];  Service: Cardiovascular;  Laterality: N/A;  . ENDARTERECTOMY  01/05/2012   Procedure: ENDARTERECTOMY CAROTID;  Surgeon: Conrad Milton, MD;  Location: [place];  Service: Vascular;  Laterality: Right;  Right Carotid Artery Endarterectomy, right stump pressure, intraoperative ultrasound  . ENDARTERECTOMY  02/08/2012   Procedure: ENDARTERECTOMY CAROTID;  Surgeon: Conrad Kersey, MD;  Location: [place];  Service: Vascular;  Laterality: Left;  . LUMBAR DISC SURGERY    . PATCH ANGIOPLASTY  02/08/2012   Procedure: PATCH ANGIOPLASTY;  Surgeon: Conrad Rudd, MD;  Location[facility];  Service: Vascular;  Laterality: Left;  Carotid artery patch angioplasty using Vascu-Guard bovine patch 1cm x 6cm.  . RENAL ARTERY STENT Right 05/2006    Allergies  Allergen Reactions  . Chlorhexidine Gluconate Itching  . Ace Inhibitors Cough  . Dilaudid [Hydromorphone Hcl] Itching    Current Outpatient Prescriptions  Medication Sig Dispense Refill  . amLODipine (NORVASC) 10 MG tablet Take 10 mg by mouth daily.     Marland Kitchen aspirin EC 81 MG tablet Take 81 mg by mouth daily.      . cloNIDine (CATAPRES) 0.1 MG tablet Take 0.1 mg by  mouth 2 (two) times daily.      . clopidogrel (PLAVIX) 75 MG tablet Take 1 tablet (75 mg total) by mouth daily. 30 tablet 11  . furosemide (LASIX) 80 MG tablet Take 1 tablet (80 mg total) by mouth 2 (two) times daily. 60 tablet 3  . isosorbide mononitrate (IMDUR) 30 MG 24 hr tablet Take 1 tablet (30 mg total) by mouth daily. 60 tablet 10  . losartan (COZAAR) 100 MG tablet Take 1 tablet by mouth daily.    . metoprolol succinate (TOPROL-XL) 25 MG 24 hr tablet Take  1 tablet (25 mg total) by mouth daily. 60 tablet 10  . nitroGLYCERIN (NITROSTAT) 0.4 MG SL tablet Place 1 tablet (0.4 mg total) under the tongue every 5 (five) minutes as needed for chest pain. 25 tablet 3  . polyethylene glycol (MIRALAX / GLYCOLAX) packet Take 17 g by mouth daily as needed for mild constipation. 14 each 0  . traMADol (ULTRAM) 50 MG tablet Take 1 tablet by mouth as directed.     No current facility-administered medications for this visit.      REVIEW OF SYSTEMS: See HPI for pertinent positives and negatives.  Physical Examination Vitals:   04/27/16 1357 04/27/16 1404  BP: 114/61 112/62  Pulse: 61   Resp: 16   Temp: 98.3 F (36.8 C)   TempSrc: Oral   SpO2: 100%   Weight: 126 lb (57.2 kg)   Height: _0  (1.651 m)    Body mass index is 20.97 kg/m.  General:  WDWN in NAD Gait: Normal HENT: WNL Eyes: PERRLA Pulmonary: normal non-labored breathing, no rales, rhonchi, or wheezing, diminished breath sounds in all fields Cardiac: RRR, no murmur detected  Abdomen: soft, NT, no palpable masses Skin: no rashes, no ulcers, no cellulitis.  VASCULAR EXAM  Carotid Bruits Left Right   Positive Positive   Radial pulses are 1+ palpable and = Aorta is not palpable.   VASCULAR EXAM: Extremitieswithout ischemic changes  without Gangrene; without open wounds.      LE Pulses LEFT RIGHT   FEMORAL 1+ palpable 1+ palpable    POPLITEAL not palpable  not palpable   POSTERIOR TIBIAL not palpable  not palpable    DORSALIS PEDIS  ANTERIOR TIBIAL not palpable  not palpable     Musculoskeletal: no muscle wasting or atrophy; no peripheral edema Neurologic:A&O X 3; Appropriate Affect ;  SENSATION: normal; MOTOR FUNCTION: 5/5 Symmetric, CN 2-12 intact Speech is fluent/normal     ASSESSMENT:  Kristen Bradley is a 71 y.o. female who is s/p right CEA on 01/05/2012, and left CEA using patch angioplasty Vascu-Guard bovine patch 1cm x 6cmon 02/08/2012.  She has not had a TIA or stroke since 2013. She also has PAOD. She has moderate claudication, no signs of ischemia in her feet/legs.  Her atherosclerotic risk factors include controlled DM,  former smoker x 53 years until February 2018, COPD, and stage 4 CKD.   DATA  ABI (04/27/16); Right: 0.51 (0.51, 09-19-15), waveforms: monophasic; TBI: 0.36 Left: 0.43 (0.45, 09-19-15), waveforms: monophasic; TBI: 0.29 Stable bilateral ABI with moderate arterial occlusive disease in the right LE and severe in the left.   09-19-15 Carotid duplex suggests bilateral carotid endarterectomy sites with no internal carotid artery stenoses. No significant change in comparison to the last exam on 06/07/14.    PLAN:   Graduated walking program discussed and how to achieve.   Based on today's exam and non-invasive vascular lab results, the patient will follow up in 6 months with the following tests: ABI's, carotid duplex.  I discussed in depth with the patient the nature of atherosclerosis, and emphasized the importance of maximal medical management including strict control of blood pressure, blood glucose, and lipid levels, obtaining regular  exercise, and continued cessation of smoking.  The patient is aware that without maximal medical management the underlying atherosclerotic disease process will progress, limiting the benefit of any interventions.  The patient was given information about stroke prevention and what symptoms should prompt the patient to seek immediate medical care.  The patient was  given information about PAD including signs, symptoms, treatment, what symptoms should prompt the patient to seek immediate medical care, and risk reduction measures to take. Thank you for allowing [place] to participate in this patient's care.  Clemon Chambers, RN, MSN, FNP-C [facility] Office: [phone]  Clinic MD: Early 04/27/2016 2:28 PM

## 2016-04-27 NOTE — Patient Instructions (Signed)
Peripheral Vascular Disease Peripheral vascular disease (PVD) is a disease of the blood vessels that are not part of your heart and brain. A simple term for PVD is poor circulation. In most cases, PVD narrows the blood vessels that carry blood from your heart to the rest of your body. This can result in a decreased supply of blood to your arms, legs, and internal organs, like your stomach or kidneys. However, it most often affects a person's lower legs and feet. There are two types of PVD.  Organic PVD. This is the more common type. It is caused by damage to the structure of blood vessels.  Functional PVD. This is caused by conditions that make blood vessels contract and tighten (spasm). Without treatment, PVD tends to get worse over time. PVD can also lead to acute ischemic limb. This is when an arm or limb suddenly has trouble getting enough blood. This is a medical emergency. Follow these instructions at home:  Take medicines only as told by your doctor.  Do not use any tobacco products, including cigarettes, chewing tobacco, or electronic cigarettes. If you need help quitting, ask your doctor.  Lose weight if you are overweight, and maintain a healthy weight as told by your doctor.  Eat a diet that is low in fat and cholesterol. If you need help, ask your doctor.  Exercise regularly. Ask your doctor for some good activities for you.  Take good care of your feet.  Wear comfortable shoes that fit well.  Check your feet often for any cuts or sores. Contact a doctor if:  You have cramps in your legs while walking.  You have leg pain when you are at rest.  You have coldness in a leg or foot.  Your skin changes.  You are unable to get or have an erection (erectile dysfunction).  You have cuts or sores on your feet that are not healing. Get help right away if:  Your arm or leg turns cold and blue.  Your arms or legs become red, warm, swollen, painful, or numb.  You have  chest pain or trouble breathing.  You suddenly have weakness in your face, arm, or leg.  You become very confused or you cannot speak.  You suddenly have a very bad headache.  You suddenly cannot see. This information is not intended to replace advice given to you by your health care provider. Make sure you discuss any questions you have with your health care provider. Document Released: 04/14/2009 Document Revised: 06/26/2015 Document Reviewed: 06/28/2013 Elsevier Interactive Patient Education  2017 Elsevier Inc.    Stroke Prevention Some medical conditions and behaviors are associated with an increased chance of having a stroke. You may prevent a stroke by making healthy choices and managing medical conditions. How can I reduce my risk of having a stroke?  Stay physically active. Get at least 30 minutes of activity on most or all days.  Do not smoke. It may also be helpful to avoid exposure to secondhand smoke.  Limit alcohol use. Moderate alcohol use is considered to be:  No more than 2 drinks per day for men.  No more than 1 drink per day for nonpregnant women.  Eat healthy foods. This involves:  Eating 5 or more servings of fruits and vegetables a day.  Making dietary changes that address high blood pressure (hypertension), high cholesterol, diabetes, or obesity.  Manage your cholesterol levels.  Making food choices that are high in fiber and low in saturated fat,   trans fat, and cholesterol may control cholesterol levels.  Take any prescribed medicines to control cholesterol as directed by your health care provider.  Manage your diabetes.  Controlling your carbohydrate and sugar intake is recommended to manage diabetes.  Take any prescribed medicines to control diabetes as directed by your health care provider.  Control your hypertension.  Making food choices that are low in salt (sodium), saturated fat, trans fat, and cholesterol is recommended to manage  hypertension.  Ask your health care provider if you need treatment to lower your blood pressure. Take any prescribed medicines to control hypertension as directed by your health care provider.  If you are 18-39 years of age, have your blood pressure checked every 3-5 years. If you are 40 years of age or older, have your blood pressure checked every year.  Maintain a healthy weight.  Reducing calorie intake and making food choices that are low in sodium, saturated fat, trans fat, and cholesterol are recommended to manage weight.  Stop drug abuse.  Avoid taking birth control pills.  Talk to your health care provider about the risks of taking birth control pills if you are over 35 years old, smoke, get migraines, or have ever had a blood clot.  Get evaluated for sleep disorders (sleep apnea).  Talk to your health care provider about getting a sleep evaluation if you snore a lot or have excessive sleepiness.  Take medicines only as directed by your health care provider.  For some people, aspirin or blood thinners (anticoagulants) are helpful in reducing the risk of forming abnormal blood clots that can lead to stroke. If you have the irregular heart rhythm of atrial fibrillation, you should be on a blood thinner unless there is a good reason you cannot take them.  Understand all your medicine instructions.  Make sure that other conditions (such as anemia or atherosclerosis) are addressed. Get help right away if:  You have sudden weakness or numbness of the face, arm, or leg, especially on one side of the body.  Your face or eyelid droops to one side.  You have sudden confusion.  You have trouble speaking (aphasia) or understanding.  You have sudden trouble seeing in one or both eyes.  You have sudden trouble walking.  You have dizziness.  You have a loss of balance or coordination.  You have a sudden, severe headache with no known cause.  You have new chest pain or an  irregular heartbeat. Any of these symptoms may represent a serious problem that is an emergency. Do not wait to see if the symptoms will go away. Get medical help at once. Call your local emergency services (911 in [place] Do not drive yourself to the hospital. This information is not intended to replace advice given to you by your health care provider. Make sure you discuss any questions you have with your health care provider. Document Released: 02/26/2004 Document Revised: 06/26/2015 Document Reviewed: 07/21/2012 Elsevier Interactive Patient Education  2017 Elsevier Inc.   

## 2016-04-28 NOTE — Addendum Note (Signed)
Addended by: Lianne Cure A on: 04/28/2016 09:44 AM   Modules accepted: Orders

## 2016-05-18 ENCOUNTER — Other Ambulatory Visit: Payer: Medicare HMO

## 2016-05-18 DIAGNOSIS — R945 Abnormal results of liver function studies: Principal | ICD-10-CM

## 2016-05-18 DIAGNOSIS — R7989 Other specified abnormal findings of blood chemistry: Secondary | ICD-10-CM

## 2016-05-18 LAB — HEPATIC FUNCTION PANEL
ALBUMIN: 4.1 g/dL (ref 3.6–4.8)
ALK PHOS: 87 IU/L (ref 39–117)
ALT: 13 IU/L (ref 0–32)
AST: 23 IU/L (ref 0–40)
BILIRUBIN TOTAL: 0.2 mg/dL (ref 0.0–1.2)
Bilirubin, Direct: 0.11 mg/dL (ref 0.00–0.40)
Total Protein: 6.6 g/dL (ref 6.0–8.5)

## 2016-05-20 ENCOUNTER — Telehealth: Payer: Self-pay | Admitting: Cardiovascular Disease

## 2016-05-20 NOTE — Telephone Encounter (Signed)
I spoke with pt and reviewed lab results with her. She confirms she has not been taking a statin.  She is aware to continue to hold statin.

## 2016-05-20 NOTE — Telephone Encounter (Signed)
New message    Pt is calling to speak to [facility], she said she needed to ask her something but did not say what.

## 2016-05-25 DIAGNOSIS — I129 Hypertensive chronic kidney disease with stage 1 through stage 4 chronic kidney disease, or unspecified chronic kidney disease: Secondary | ICD-10-CM | POA: Diagnosis not present

## 2016-05-25 DIAGNOSIS — N2581 Secondary hyperparathyroidism of renal origin: Secondary | ICD-10-CM | POA: Diagnosis not present

## 2016-05-25 DIAGNOSIS — N183 Chronic kidney disease, stage 3 (moderate): Secondary | ICD-10-CM | POA: Diagnosis not present

## 2016-05-25 DIAGNOSIS — D631 Anemia in chronic kidney disease: Secondary | ICD-10-CM | POA: Diagnosis not present

## 2016-05-25 DIAGNOSIS — I701 Atherosclerosis of renal artery: Secondary | ICD-10-CM | POA: Diagnosis not present

## 2016-05-27 DIAGNOSIS — H524 Presbyopia: Secondary | ICD-10-CM | POA: Diagnosis not present

## 2016-05-27 DIAGNOSIS — E119 Type 2 diabetes mellitus without complications: Secondary | ICD-10-CM | POA: Diagnosis not present

## 2016-06-29 ENCOUNTER — Telehealth: Payer: Self-pay | Admitting: Cardiovascular Disease

## 2016-06-29 ENCOUNTER — Other Ambulatory Visit: Payer: Self-pay | Admitting: *Deleted

## 2016-06-29 MED ORDER — FUROSEMIDE 80 MG PO TABS: 80.0000 mg | ORAL_TABLET | Freq: Two times a day (BID) | ORAL | 0 refills | Status: DC

## 2016-06-29 NOTE — Telephone Encounter (Signed)
New message        *STAT* If patient is at the pharmacy, call can be transferred to refill team.   1. Which medications need to be refilled? (please list name of each medication and dose if known)  Furosemide 80mg  2. Which pharmacy/location (including street and city if local pharmacy) is medication to be sent to? [facility] 3. Do they need a 30 day or 90 day supply?  30 day

## 2016-06-29 NOTE — Telephone Encounter (Signed)
furosemide (LASIX) 80 MG tablet  Medication  Date: 06/29/2016 Department: [address] Office Ordering/Authorizing: Burnell Blanks, MD  Order Providers   Prescribing Provider Encounter Provider  Burnell Blanks, MD Roberts Gaudy, CMA  Medication Detail    Disp Refills Start End   furosemide (LASIX) 80 MG tablet 60 tablet 0 06/29/2016    Sig - Route: Take 1 tablet (80 mg total) by mouth 2 (two) times daily. - Oral   E-Prescribing Status: Receipt confirmed by pharmacy (06/29/2016 9:46 AM EDT)   Pharmacy   [facility] ([facility] [place] - [place]

## 2016-07-06 DIAGNOSIS — E119 Type 2 diabetes mellitus without complications: Secondary | ICD-10-CM | POA: Diagnosis not present

## 2016-07-06 DIAGNOSIS — D696 Thrombocytopenia, unspecified: Secondary | ICD-10-CM | POA: Diagnosis not present

## 2016-07-06 DIAGNOSIS — I739 Peripheral vascular disease, unspecified: Secondary | ICD-10-CM | POA: Diagnosis not present

## 2016-07-06 DIAGNOSIS — I6523 Occlusion and stenosis of bilateral carotid arteries: Secondary | ICD-10-CM | POA: Diagnosis not present

## 2016-07-06 DIAGNOSIS — Z7984 Long term (current) use of oral hypoglycemic drugs: Secondary | ICD-10-CM | POA: Diagnosis not present

## 2016-07-06 DIAGNOSIS — I10 Essential (primary) hypertension: Secondary | ICD-10-CM | POA: Diagnosis not present

## 2016-07-06 DIAGNOSIS — E559 Vitamin D deficiency, unspecified: Secondary | ICD-10-CM | POA: Diagnosis not present

## 2016-07-06 DIAGNOSIS — E785 Hyperlipidemia, unspecified: Secondary | ICD-10-CM | POA: Diagnosis not present

## 2016-07-06 DIAGNOSIS — N189 Chronic kidney disease, unspecified: Secondary | ICD-10-CM | POA: Diagnosis not present

## 2016-07-30 ENCOUNTER — Telehealth: Payer: Self-pay | Admitting: Cardiovascular Disease

## 2016-07-30 MED ORDER — FUROSEMIDE 80 MG PO TABS: 80.0000 mg | ORAL_TABLET | Freq: Two times a day (BID) | ORAL | 2 refills | Status: DC

## 2016-07-30 NOTE — Telephone Encounter (Signed)
°  New Prob   *STAT* If patient is at the pharmacy, call can be transferred to refill team.   1. Which medications need to be refilled? (please list name of each medication and dose if known) furosemide (LASIX) 80 MG tablet  2. Which pharmacy/location (including street and city if local pharmacy) is medication to be sent to? [facility] on [address]   3. Do they need a 30 day or 90 day supply? [place]

## 2016-07-30 NOTE — Telephone Encounter (Signed)
New Prob   *STAT* If patient is at the pharmacy, call can be transferred to refill team.   1. Which medications need to be refilled? (please list name of each medication and dose if known) furosemide (LASIX) 80 MG tablet  2. Which pharmacy/location (including street and city if local pharmacy) is medication to be sent to? [facility] on [address]   3. Do they need a 30 day or 90 day supply? [place]

## 2016-07-30 NOTE — Telephone Encounter (Signed)
Pt's medication was sent to pt's pharmacy as requested. Confirmation received.  °

## 2016-08-03 DIAGNOSIS — R197 Diarrhea, unspecified: Secondary | ICD-10-CM | POA: Diagnosis not present

## 2016-08-16 ENCOUNTER — Other Ambulatory Visit: Payer: Self-pay | Admitting: Family Medicine

## 2016-08-16 DIAGNOSIS — Z1231 Encounter for screening mammogram for malignant neoplasm of breast: Secondary | ICD-10-CM

## 2016-08-18 DIAGNOSIS — I129 Hypertensive chronic kidney disease with stage 1 through stage 4 chronic kidney disease, or unspecified chronic kidney disease: Secondary | ICD-10-CM | POA: Diagnosis not present

## 2016-08-18 DIAGNOSIS — N183 Chronic kidney disease, stage 3 (moderate): Secondary | ICD-10-CM | POA: Diagnosis not present

## 2016-08-18 DIAGNOSIS — N2581 Secondary hyperparathyroidism of renal origin: Secondary | ICD-10-CM | POA: Diagnosis not present

## 2016-08-18 DIAGNOSIS — I701 Atherosclerosis of renal artery: Secondary | ICD-10-CM | POA: Diagnosis not present

## 2016-08-18 DIAGNOSIS — D631 Anemia in chronic kidney disease: Secondary | ICD-10-CM | POA: Diagnosis not present

## 2016-08-23 ENCOUNTER — Other Ambulatory Visit: Payer: Commercial Managed Care - HMO

## 2016-08-30 DIAGNOSIS — Z86010 Personal history of colonic polyps: Secondary | ICD-10-CM | POA: Diagnosis not present

## 2016-08-30 DIAGNOSIS — R1013 Epigastric pain: Secondary | ICD-10-CM | POA: Diagnosis not present

## 2016-08-30 DIAGNOSIS — R197 Diarrhea, unspecified: Secondary | ICD-10-CM | POA: Diagnosis not present

## 2016-09-27 ENCOUNTER — Ambulatory Visit
Admission: RE | Admit: 2016-09-27 | Discharge: 2016-09-27 | Disposition: A | Discharge status: Home / Self Care | Payer: Medicare HMO | Source: Ambulatory Visit | Attending: Family Medicine | Admitting: Family Medicine

## 2016-09-27 DIAGNOSIS — Z1231 Encounter for screening mammogram for malignant neoplasm of breast: Secondary | ICD-10-CM

## 2016-10-06 ENCOUNTER — Ambulatory Visit (INDEPENDENT_AMBULATORY_CARE_PROVIDER_SITE_OTHER): Payer: Medicare HMO | Admitting: Cardiovascular Disease

## 2016-10-06 ENCOUNTER — Encounter: Payer: Self-pay | Admitting: Cardiovascular Disease

## 2016-10-06 VITALS — BP 146/90 | HR 65 | Ht 65.0 in | Wt 138.0 lb

## 2016-10-06 DIAGNOSIS — F17201 Nicotine dependence, unspecified, in remission: Secondary | ICD-10-CM

## 2016-10-06 DIAGNOSIS — I255 Ischemic cardiomyopathy: Secondary | ICD-10-CM

## 2016-10-06 DIAGNOSIS — I5022 Chronic systolic (congestive) heart failure: Secondary | ICD-10-CM

## 2016-10-06 DIAGNOSIS — I2510 Atherosclerotic heart disease of native coronary artery without angina pectoris: Secondary | ICD-10-CM | POA: Diagnosis not present

## 2016-10-06 DIAGNOSIS — I739 Peripheral vascular disease, unspecified: Secondary | ICD-10-CM

## 2016-10-06 NOTE — Progress Notes (Signed)
Chief Complaint  Patient presents with  . Follow-up    CAD/cardiomyopathy    History of Present Illness: 71 yo AAF with history of CAD, ischemic cardiomyopathy, systolic CHF, tobacco abuse, bilateral carotid artery stenosis, renal artery stenosis s/p stenting right renal artery, PAD,  hepatitis C, HTN, HLD, DM who is here today for cardiac follow up. I saw her 11/30/11 for pre-operative evaluation prior to planned carotid endarterectomy with Dr. [name] with [facility]. She had a stress test December 2012 in [facility] that was reported as normal. I arranged an echo 12/07/11 which showed normal LV and RV size and function with no significant valvular issues. Her carotid artery disease, renal artery stenosis and LE PAD is followed by Dr. [name] in the [facility] office. She was admitted to [facility] December 2018 with volume overload, dyspnea. Cardiac cath 02/03/16 with severe 3 vessel CAD with severe calcification of all vessels. Echo with LVEF=25%, mild MR. She was not felt to be a candidate for CABG or PCI. She was readmitted February 2018 with acute CHF exacerbation. She was diuresed and had improvement in symptoms.    She is here today for follow up. The patient denies any chest pain, dyspnea, palpitations, lower extremity edema, orthopnea, PND, dizziness, near syncope or syncope. She is able to walk without any dyspnea. She notes gaining several pounds over the past 6 months due to "eating too much".     Primary Care Physician: [name], MD Vascular Surgery: [name]  Past Medical History:  Diagnosis Date  . Adrenal tumor 08/2007   surgery  . Cancer of right breast [facility])    s/p lumpectomy, XRT  . Carotid artery disease (HCC)    s/p bilateral CEA  . Hepatitis C    "got treatment for it" (01/29/2016)  . High cholesterol    takes Lopid daily  . History of UTI    takes Diflucan dail--pt states no uti in a couple of yrs though  . Hypertension    takes Losartan,Amlodipine,HCTZ,and Atenolol daily and  Clatarpres  . Joint pain    legs  . Renal artery stenosis (HCC)    Right renal artery stent 05/31/06  . Stroke [facility]) Jan. 7, 2014  . Tobacco abuse   . Type II diabetes mellitus ([place])     Past Surgical History:  Procedure Laterality Date  . ABDOMINAL HYSTERECTOMY    . ADRENAL GLAND SURGERY  2009  . BREAST LUMPECTOMY Right 2009  . CARDIAC CATHETERIZATION N/A 02/03/2016   Procedure: Right/Left Heart Cath and Coronary Angiography;  Surgeon: Burnell Blanks, MD;  Location: [place] [facility];  Service: Cardiovascular;  Laterality: N/A;  . ENDARTERECTOMY  01/05/2012   Procedure: ENDARTERECTOMY CAROTID;  Surgeon: Conrad Elkton, MD;  Location: [place];  Service: Vascular;  Laterality: Right;  Right Carotid Artery Endarterectomy, right stump pressure, intraoperative ultrasound  . ENDARTERECTOMY  02/08/2012   Procedure: ENDARTERECTOMY CAROTID;  Surgeon: Conrad Belle, MD;  Location: [place];  Service: Vascular;  Laterality: Left;  . LUMBAR DISC SURGERY    . PATCH ANGIOPLASTY  02/08/2012   Procedure: PATCH ANGIOPLASTY;  Surgeon: Conrad , MD;  Location: [facility] OR;  Service: Vascular;  Laterality: Left;  Carotid artery patch angioplasty using Vascu-Guard bovine patch 1cm x 6cm.  . RENAL ARTERY STENT Right 05/2006    Current Outpatient Prescriptions  Medication Sig Dispense Refill  . amLODipine (NORVASC) 10 MG tablet Take 10 mg by mouth daily.     Marland Kitchen aspirin EC 81  MG tablet Take 81 mg by mouth daily.      . cloNIDine (CATAPRES) 0.1 MG tablet Take 0.1 mg by mouth 2 (two) times daily.      . clopidogrel (PLAVIX) 75 MG tablet Take 1 tablet (75 mg total) by mouth daily. 30 tablet 11  . furosemide (LASIX) 80 MG tablet Take 1 tablet (80 mg total) by mouth 2 (two) times daily. 180 tablet 2  . isosorbide mononitrate (IMDUR) 30 MG 24 hr tablet Take 1 tablet (30 mg total) by mouth daily. 60 tablet 10  . losartan (COZAAR) 100 MG tablet Take 1 tablet by mouth daily.    . metFORMIN (GLUCOPHAGE-XR) 500 MG 24 hr  tablet Take 500 mg by mouth daily.    . metoprolol succinate (TOPROL-XL) 25 MG 24 hr tablet Take 1 tablet (25 mg total) by mouth daily. 60 tablet 10  . nitroGLYCERIN (NITROSTAT) 0.4 MG SL tablet Place 1 tablet (0.4 mg total) under the tongue every 5 (five) minutes as needed for chest pain. 25 tablet 3  . polyethylene glycol (MIRALAX / GLYCOLAX) packet Take 17 g by mouth daily as needed for mild constipation. 14 each 0  . traMADol (ULTRAM) 50 MG tablet Take 1 tablet by mouth as directed.     No current facility-administered medications for this visit.     Allergies  Allergen Reactions  . Chlorhexidine Gluconate Itching  . Ace Inhibitors Cough  . Dilaudid [Hydromorphone Hcl] Itching    Social History   Social History  . Marital status: Divorced    Spouse name: N/A  . Number of children: 1  . Years of education: N/A   Occupational History  . Retired-truck driver Retired   Social History Main Topics  . Smoking status: Former Smoker    Years: 53.00    Types: Cigarettes    Quit date: 03/2016  . Smokeless tobacco: Never Used  . Alcohol use No  . Drug use: No  . Sexual activity: Not on file   Other Topics Concern  . Not on file   Social History Narrative  . No narrative on file    Family History  Problem Relation Age of Onset  . Cancer Mother        ? type  . Cancer Father        ? type  . Diabetes Sister   . Hypertension Sister   . Diabetes Brother   . Hypertension Brother   . CAD Neg Hx     Review of Systems:  As stated in the HPI and otherwise negative.   BP (!) 146/90   Pulse 65   Ht 5\' 5"  (1.651 m)   Wt 138 lb (62.6 kg)   SpO2 99%   BMI 22.96 kg/m   Physical Examination:  General: Well developed, well nourished, NAD  HEENT: OP clear, mucus membranes moist  SKIN: warm, dry. No rashes. Neuro: No focal deficits  Musculoskeletal: Muscle strength 5/5 all ext  Psychiatric: Mood and affect normal  Neck: No JVD. There are bilateral carotid  bruits Lungs:Clear bilaterally, no wheezes, rhonci, crackles Cardiovascular: Regular rate and rhythm. Systolic murmur noted. No gallops or rubs. Abdomen:Soft. Bowel sounds present. Non-tender.  Extremities: No lower extremity edema.   Echo 01/30/16: Left ventricle: The cavity size was normal. Wall thickness was   increased in a pattern of mild LVH. The estimated ejection   fraction was 25%. Diffuse severe hypokinesis with septal-lateral   dyssynchrony. Doppler parameters are consistent with restrictive  physiology, indicative of decreased left ventricular diastolic   compliance and/or increased left atrial pressure. - Aortic valve: There was no stenosis. - Mitral valve: There was mild to moderate regurgitation. - Left atrium: The atrium was moderately dilated. - Right ventricle: The cavity size was normal. Systolic function   was mildly reduced. - Tricuspid valve: Peak RV-RA gradient (S): 44 mm Hg. - Pulmonary arteries: PA peak pressure: 59 mm Hg (S). - Systemic veins: IVC measured 2.1 cm with < 50% respirophasic   variation, suggesting RA pressure 15 mmHg. - Pericardium, extracardiac: A trivial pericardial effusion was   identified posterior to the heart.  Impressions:  - Normal LV size with mild LV hypertrophy. EF 25% with severe   diffuse hypokinesis and septal-lateral dyssynchrony. Restrictive   diastolic function. Normal RV size with mildly decreased systolic   function. Moderate pulmonary hypertension. Mild to moderate MR.   Dilated IVC suggestive of elevated RV filling pressure.  Cardiac cath 02/03/16:  Ost RCA to Prox RCA lesion, 60 %stenosed.  Mid RCA lesion, 80 %stenosed.  Dist RCA lesion, 60 %stenosed.  RPDA lesion, 99 %stenosed.  Ost LM lesion, 50 %stenosed.  Ost Cx to Prox Cx lesion, 70 %stenosed.  Ost 3rd Mrg to 3rd Mrg lesion, 99 %stenosed.  2nd Mrg lesion, 50 %stenosed.  Ost 2nd Mrg to 2nd Mrg lesion, 20 %stenosed.  Prox Cx to Mid Cx lesion, 20  %stenosed.  Mid LAD lesion, 90 %stenosed.  1st Diag lesion, 90 %stenosed.  Dist LAD lesion, 80 %stenosed.  LV end diastolic pressure is normal.  Mid LAD to Dist LAD lesion, 20 %stenosed.  Ost LAD to Prox LAD lesion, 20 %stenosed.   1. Severe calcific three vessel CAD 2. Normal filling pressures 3. Ischemic cardiomyopathy (LV function reduced by echo)  Recommendations: She has severe calcification in all three vessels. Her anatomy is not favorable for PCI. I will ask CT surgery to see her to discuss CABG.  Diagnostic Diagram        EKG:  EKG is not  ordered today. The ekg ordered today demonstrates   Recent Labs: 01/29/2016: TSH 0.978 03/03/2016: B Natriuretic Peptide 3,214.8; Hemoglobin 9.8; Platelets 121 03/05/2016: BUN 28; Creatinine, Ser 2.06; Potassium 3.3; Sodium 142 05/18/2016: ALT 13   Lipid Panel    Component Value Date/Time   CHOL 129 04/12/2016 0839   TRIG 134 04/12/2016 0839   HDL 40 04/12/2016 0839   CHOLHDL 3.2 04/12/2016 0839   LDLCALC 62 04/12/2016 0839     Wt Readings from Last 3 Encounters:  10/06/16 138 lb (62.6 kg)  04/27/16 126 lb (57.2 kg)  04/23/16 125 lb (56.7 kg)     Other studies Reviewed: Additional studies/ records that were reviewed today include: . Review of the above records demonstrates:   Assessment and Plan:   1. CAD without angina: She has no chest pain. She is known to have severe, diffuse calcific CAD and is not a candidate for PCI or CABG. Her anatomy is not favorable for PCI. Her calcified aorta is a contraindication for CABG.  Will continue medical therapy with ASA, Plavix, Imdur, beta blocker, ARB, Norvasc. Statin held to elevated LFTs which resolved off of statin. Will discuss at next visit.   2. Ischemic cardiomyopathy: She is known to have LVEF=25% by echo in December 2017. She is on a beta blocker and ARB. She is NYHA class 1. Given her functional class, I do not think Delene Loll is indicated. Will repeat echo now and  if  LVEF is still below 35%, will refer to EP for ICD.   3. Chronic systolic CHF:  Volume status ok on Lasix 80 mg po BID   4. Tobacco abuse, in remission: She has stopped smoking  5. PAD: Severe LE disease, bilateral carotid disease and renal artery stenosis followed in [facility].    Current medicines are reviewed at length with the patient today.  The patient does not have concerns regarding medicines.  The following changes have been made:  no change  Labs/ tests ordered today include:   Orders Placed This Encounter  Procedures  . ECHOCARDIOGRAM COMPLETE   Disposition:   FU with md in 6 weeks  Signed, [name], MD 10/06/2016 9:44 AM    [facility] [address], [place], [place]  [address] Phone: [phone]; Fax: [phone]

## 2016-10-06 NOTE — Patient Instructions (Signed)
Medication Instructions:    Your physician recommends that you continue on your current medications as directed. Please refer to the Current Medication list given to you today.  Labwork:  none  Testing/Procedures: Your physician has requested that you have an echocardiogram. Echocardiography is a painless test that uses sound waves to create images of your heart. It provides your doctor with information about the size and shape of your heart and how well your heart's chambers and valves are working. This procedure takes approximately one hour. There are no restrictions for this procedure  Follow-Up:  Your physician recommends that you schedule a follow-up appointment in: 6-8 weeks.  Any Other Special Instructions Will Be Listed Below (If Applicable).  If you need a refill on your cardiac medications before your next appointment, please call your pharmacy. 

## 2016-10-13 ENCOUNTER — Other Ambulatory Visit: Payer: Self-pay

## 2016-10-13 ENCOUNTER — Ambulatory Visit (HOSPITAL_COMMUNITY): Payer: Medicare HMO | Attending: Cardiovascular Disease

## 2016-10-13 DIAGNOSIS — I255 Ischemic cardiomyopathy: Secondary | ICD-10-CM

## 2016-10-13 DIAGNOSIS — I420 Dilated cardiomyopathy: Secondary | ICD-10-CM | POA: Diagnosis not present

## 2016-10-13 DIAGNOSIS — I5030 Unspecified diastolic (congestive) heart failure: Secondary | ICD-10-CM | POA: Insufficient documentation

## 2016-10-13 DIAGNOSIS — I051 Rheumatic mitral insufficiency: Secondary | ICD-10-CM | POA: Diagnosis not present

## 2016-10-15 ENCOUNTER — Encounter: Payer: Self-pay | Admitting: Internal Medicine

## 2016-10-21 ENCOUNTER — Telehealth: Payer: Self-pay | Admitting: Cardiovascular Disease

## 2016-10-21 NOTE — Telephone Encounter (Signed)
Melissa at Dr. [facility] office would like to know  If pt can hold Plavix 7 mg for 5 days prior  a colonoscopy.  Dr Angelena Form is aware and agreed that pt can hold Plavix for 5 days prior the procedure. Dr. Angelena Form states he spoke with Dr. Michail Sermon this morning and he is aware to hold Plavix.  I left Melissa a detail message and to call if needed.

## 2016-10-21 NOTE — Telephone Encounter (Signed)
New Message:   Pt is there for a Colonoscopy,she needs to talk to a nurse about her blood thinner,

## 2016-10-26 ENCOUNTER — Encounter: Payer: Self-pay | Admitting: Internal Medicine

## 2016-10-26 ENCOUNTER — Ambulatory Visit (INDEPENDENT_AMBULATORY_CARE_PROVIDER_SITE_OTHER): Payer: Medicare HMO | Admitting: Internal Medicine

## 2016-10-26 ENCOUNTER — Encounter (INDEPENDENT_AMBULATORY_CARE_PROVIDER_SITE_OTHER): Payer: Self-pay

## 2016-10-26 VITALS — BP 152/78 | HR 77 | Ht 65.0 in | Wt 145.6 lb

## 2016-10-26 DIAGNOSIS — I255 Ischemic cardiomyopathy: Secondary | ICD-10-CM

## 2016-10-26 DIAGNOSIS — I447 Left bundle-branch block, unspecified: Secondary | ICD-10-CM

## 2016-10-26 DIAGNOSIS — I5022 Chronic systolic (congestive) heart failure: Secondary | ICD-10-CM | POA: Diagnosis not present

## 2016-10-26 LAB — BASIC METABOLIC PANEL
BUN / CREAT RATIO: 22 (ref 12–28)
BUN: 39 mg/dL — ABNORMAL HIGH (ref 8–27)
CO2: 25 mmol/L (ref 20–29)
Calcium: 10.5 mg/dL — ABNORMAL HIGH (ref 8.7–10.3)
Chloride: 100 mmol/L (ref 96–106)
Creatinine, Ser: 1.77 mg/dL — ABNORMAL HIGH (ref 0.57–1.00)
GFR calc Af Amer: 33 mL/min/{1.73_m2} — ABNORMAL LOW (ref >59)
GFR, EST NON AFRICAN AMERICAN: 29 mL/min/{1.73_m2} — AB (ref >59)
GLUCOSE: 111 mg/dL — AB (ref 65–99)
POTASSIUM: 4.4 mmol/L (ref 3.5–5.2)
SODIUM: 141 mmol/L (ref 134–144)

## 2016-10-26 LAB — CBC
Hematocrit: 32.4 % — ABNORMAL LOW (ref 34.0–46.6)
Hemoglobin: 10.9 g/dL — ABNORMAL LOW (ref 11.1–15.9)
MCH: 29.3 pg (ref 26.6–33.0)
MCHC: 33.6 g/dL (ref 31.5–35.7)
MCV: 87 fL (ref 79–97)
Platelets: 117 10*3/uL — ABNORMAL LOW (ref 150–379)
RBC: 3.72 x10E6/uL — ABNORMAL LOW (ref 3.77–5.28)
RDW: 13.3 % (ref 12.3–15.4)
WBC: 4.8 10*3/uL (ref 3.4–10.8)

## 2016-10-26 NOTE — Patient Instructions (Addendum)
Medication Instructions:  Your physician recommends that you continue on your current medications as directed. Please refer to the Current Medication list given to you today.  Labwork: You will need blood work today in preparation for your procedure:  BMP and CBC.    Testing/Procedures: Your physician has recommended that you have a defibrillator inserted. An implantable cardioverter defibrillator (ICD) is a small device that is placed in your chest or, in rare cases, your abdomen. This device uses electrical pulses or shocks to help control life-threatening, irregular heartbeats that could lead the heart to suddenly stop beating (sudden cardiac arrest). Leads are attached to the ICD that goes into your heart. This is done in the hospital and usually requires an overnight stay.  Follow-Up:  You will follow up in 10-14 days with the device clinic for a wound check.  You will follow up with Dr. Lovena Le 91 days after your procedure.   Any Other Special Instructions Will Be Listed Below (If Applicable).  Please arrive at the [facility] main entrance of [facility] hospital at:  8:30 am on 11/05/2016 Do not eat or drink after midnight prior to procedure Do not take any medications the morning of the procedure Use the CHG scrub as directed and given to you today Plan for one night stay You will need someone to drive you home at discharge   If you need a refill on your cardiac medications before your next appointment, please call your pharmacy.    Cardioverter Defibrillator Implantation An implantable cardioverter defibrillator (ICD) is a small device that is placed under the skin in the chest or abdomen. An ICD consists of a battery, a small computer (pulse generator), and wires (leads) that go into the heart. An ICD is used to detect and correct two types of dangerous irregular heartbeats (arrhythmias):  A rapid heart rhythm (tachycardia).  An arrhythmia in which the lower chambers of  the heart (ventricles) contract in an uncoordinated way (fibrillation).  When an ICD detects tachycardia, it sends a low-energy shock to the heart to restore the heartbeat to normal (cardioversion). This signal is usually painless. If cardioversion does not work or if the ICD detects fibrillation, it delivers a high-energy shock to the heart (defibrillation) to restart the heart. This shock may feel like a strong jolt in the chest. Your health care provider may prescribe an ICD if:  You have had an arrhythmia that originated in the ventricles.  Your heart has been damaged by a disease or heart condition.  Sometimes, ICDs are programmed to act as a device called a pacemaker. Pacemakers can be used to treat a slow heartbeat (bradycardia) or tachycardia by taking over the heart rate with electrical impulses. Tell a health care provider about:  Any allergies you have.  All medicines you are taking, including vitamins, herbs, eye drops, creams, and over-the-counter medicines.  Any problems you or family members have had with anesthetic medicines.  Any blood disorders you have.  Any surgeries you have had.  Any medical conditions you have.  Whether you are pregnant or may be pregnant. What are the risks? Generally, this is a safe procedure. However, problems may occur, including:  Swelling, bleeding, or bruising.  Infection.  Blood clots.  Damage to other structures or organs, such as nerves, blood vessels, or the heart.  Allergic reactions to medicines used during the procedure.  What happens before the procedure? Staying hydrated Follow instructions from your health care provider about hydration, which may include:  Up to 2 hours before the procedure - you may continue to drink clear liquids, such as water, clear fruit juice, black coffee, and plain tea.  Eating and drinking restrictions Follow instructions from your health care provider about eating and drinking, which may  include:  8 hours before the procedure - stop eating heavy meals or foods such as meat, fried foods, or fatty foods.  6 hours before the procedure - stop eating light meals or foods, such as toast or cereal.  6 hours before the procedure - stop drinking milk or drinks that contain milk.  2 hours before the procedure - stop drinking clear liquids.  Medicine Ask your health care provider about:  Changing or stopping your normal medicines. This is important if you take diabetes medicines or blood thinners.  Taking medicines such as aspirin and ibuprofen. These medicines can thin your blood. Do not take these medicines before your procedure if your doctor tells you not to.  Tests  You may have blood tests.  You may have a test to check the electrical signals in your heart (electrocardiogram, ECG).  You may have imaging tests, such as a chest X-ray. General instructions  For 24 hours before the procedure, stop using products that contain nicotine or tobacco, such as cigarettes and e-cigarettes. If you need help quitting, ask your health care provider.  Plan to have someone take you home from the hospital or clinic.  You may be asked to shower with a germ-killing soap. What happens during the procedure?  To reduce your risk of infection: ? Your health care team will wash or sanitize their hands. ? Your skin will be washed with soap. ? Hair may be removed from the surgical area.  Small monitors will be put on your body. They will be used to check your heart, blood pressure, and oxygen level.  An IV tube will be inserted into one of your veins.  You will be given one or more of the following: ? A medicine to help you relax (sedative). ? A medicine to numb the area (local anesthetic). ? A medicine to make you fall asleep (general anesthetic).  Leads will be guided through a blood vessel into your heart and attached to your heart muscles. Depending on the ICD, the leads may go  into one ventricle or they may go into both ventricles and into an upper chamber of the heart. An X-ray machine (fluoroscope) will be usedto help guide the leads.  A small incision will be made to create a deep pocket under your skin.  The pulse generator will be placed into the pocket.  The ICD will be tested.  The incision will be closed with stitches (sutures), skin glue, or staples.  A bandage (dressing) will be placed over the incision. This procedure may vary among health care providers and hospitals. What happens after the procedure?  Your blood pressure, heart rate, breathing rate, and blood oxygen level will be monitored often until the medicines you were given have worn off.  A chest X-ray will be taken to check that the ICD is in the right place.  You will need to stay in the hospital for 1-2 days so your health care provider can make sure your ICD is working.  Do not drive for 24 hours if you received a sedative. Ask your health care provider when it is safe for you to drive.  You may be given an identification card explaining that you have an ICD. Summary  An implantable cardioverter defibrillator (ICD) is a small device that is placed under the skin in the chest or abdomen. It is used to detect and correct dangerous irregular heartbeats (arrhythmias).  An ICD consists of a battery, a small computer (pulse generator), and wires (leads) that go into the heart.  When an ICD detects rapid heart rhythm (tachycardia), it sends a low-energy shock to the heart to restore the heartbeat to normal (cardioversion). If cardioversion does not work or if the ICD detects uncoordinated heart contractions (fibrillation), it delivers a high-energy shock to the heart (defibrillation) to restart the heart.  You will need to stay in the hospital for 1-2 days to make sure your ICD is working. This information is not intended to replace advice given to you by your health care provider. Make  sure you discuss any questions you have with your health care provider. Document Released: 10/10/2001 Document Revised: 01/28/2016 Document Reviewed: 01/28/2016 Elsevier Interactive Patient Education  2017 [employer].

## 2016-10-26 NOTE — Progress Notes (Signed)
HPI Kristen Bradley is referred today to consider prophylactic ICD implantation. The patient is a very pleasant 71 year old woman with a history of an ischemic cardiomyopathy, chronic systolic heart failure, class III, left bundle branch block, carotid vascular disease, peripheral vascular disease, severe left ventricular dysfunction, ejection fraction 25%. She was not thought to be a good candidate for bypass surgery secondary to severe and diffuse disease. She has not had syncope. Allergies  Allergen Reactions  . Chlorhexidine Gluconate Itching  . Ace Inhibitors Cough  . Dilaudid [Hydromorphone Hcl] Itching     Current Outpatient Prescriptions  Medication Sig Dispense Refill  . amLODipine (NORVASC) 10 MG tablet Take 10 mg by mouth daily.     Marland Kitchen aspirin EC 81 MG tablet Take 81 mg by mouth daily.      . cloNIDine (CATAPRES) 0.1 MG tablet Take 0.1 mg by mouth 2 (two) times daily.      . furosemide (LASIX) 80 MG tablet Take 1 tablet (80 mg total) by mouth 2 (two) times daily. 180 tablet 2  . isosorbide mononitrate (IMDUR) 30 MG 24 hr tablet Take 1 tablet (30 mg total) by mouth daily. 60 tablet 10  . metFORMIN (GLUCOPHAGE-XR) 500 MG 24 hr tablet Take 500 mg by mouth daily.    . metoprolol succinate (TOPROL-XL) 25 MG 24 hr tablet Take 1 tablet (25 mg total) by mouth daily. 60 tablet 10  . nitroGLYCERIN (NITROSTAT) 0.4 MG SL tablet Place 1 tablet (0.4 mg total) under the tongue every 5 (five) minutes as needed for chest pain. 25 tablet 3  . traMADol (ULTRAM) 50 MG tablet Take 1 tablet by mouth as directed.     No current facility-administered medications for this visit.      Past Medical History:  Diagnosis Date  . Adrenal tumor 08/2007   surgery  . Cancer of right breast [facility])    s/p lumpectomy, XRT  . Carotid artery disease (HCC)    s/p bilateral CEA  . Hepatitis C    "got treatment for it" (01/29/2016)  . High cholesterol    takes Lopid daily  . History of UTI    takes  Diflucan dail--pt states no uti in a couple of yrs though  . Hypertension    takes Losartan,Amlodipine,HCTZ,and Atenolol daily and Clatarpres  . Joint pain    legs  . Renal artery stenosis (HCC)    Right renal artery stent 05/31/06  . Stroke [facility]) Jan. 7, 2014  . Tobacco abuse   . Type II diabetes mellitus (HCC)     ROS:   All systems reviewed and negative except as noted in the HPI.   Past Surgical History:  Procedure Laterality Date  . ABDOMINAL HYSTERECTOMY    . ADRENAL GLAND SURGERY  2009  . BREAST LUMPECTOMY Right 2009  . CARDIAC CATHETERIZATION N/A 02/03/2016   Procedure: Right/Left Heart Cath and Coronary Angiography;  Surgeon: Burnell Blanks, MD;  Location: [place] [facility];  Service: Cardiovascular;  Laterality: N/A;  . ENDARTERECTOMY  01/05/2012   Procedure: ENDARTERECTOMY CAROTID;  Surgeon: Conrad Chesterfield, MD;  Location: [place];  Service: Vascular;  Laterality: Right;  Right Carotid Artery Endarterectomy, right stump pressure, intraoperative ultrasound  . ENDARTERECTOMY  02/08/2012   Procedure: ENDARTERECTOMY CAROTID;  Surgeon: Conrad Elfers, MD;  Location: [place];  Service: Vascular;  Laterality: Left;  . LUMBAR DISC SURGERY    . PATCH ANGIOPLASTY  02/08/2012   Procedure: PATCH ANGIOPLASTY;  Surgeon: Aaron Edelman  Starlyn Skeans, MD;  Location: [place] [facility];  Service: Vascular;  Laterality: Left;  Carotid artery patch angioplasty using Vascu-Guard bovine patch 1cm x 6cm.  . RENAL ARTERY STENT Right 05/2006     Family History  Problem Relation Age of Onset  . Cancer Mother        ? type  . Cancer Father        ? type  . Diabetes Sister   . Hypertension Sister   . Diabetes Brother   . Hypertension Brother   . CAD Neg Hx      Social History   Social History  . Marital status: Divorced    Spouse name: N/A  . Number of children: 1  . Years of education: N/A   Occupational History  . Retired-truck driver Retired   Social History Main Topics  . Smoking status: Former Smoker     Years: 53.00    Types: Cigarettes    Quit date: 03/2016  . Smokeless tobacco: Never Used  . Alcohol use No  . Drug use: No  . Sexual activity: Not on file   Other Topics Concern  . Not on file   Social History Narrative  . No narrative on file     BP (!) 152/78   Pulse 77   Ht 5\' 5"  (1.651 m)   Wt 145 lb 9.6 oz (66 kg)   SpO2 98%   BMI 24.23 kg/m   Physical Exam:  Stable appearing 71 year old woman, NAD HEENT: Unremarkable Neck:  8 cm JVD, no thyromegally Lymphatics:  No adenopathy Back:  No CVA tenderness Lungs:  Clear, with rales in the bases, but no wheezes or rhonchi. HEART:  Regular rate rhythm, no murmurs, no rubs, no clicks, soft S3 is present, and PMI is laterally displaced. Abd:  soft, positive bowel sounds, no organomegally, no rebound, no guarding Ext:  2 plus pulses, no edema, no cyanosis, no clubbing Skin:  No rashes no nodules Neuro:  CN II through XII intact, motor grossly intact  EKG - reviewed - normal sinus rhythm with left bundle branch block, QRS duration 150 ms  DEVICE  Normal device function.  See PaceArt for details.   Assess/Plan: 1. Chronic systolic heart failure - she is on maximal medical therapy. I've discussed the treatment options in detail with the patient and her son. The risk, goals, benefits, and expectations of biventricular ICD insertion were reviewed and she wishes to proceed. 2. Ischemic cardiomyopathy - she has class II anginal symptoms. She will continue her current medications. I would hope to up titrate her beta blocker once her ICD is in place. 3. Peripheral vascular disease - she has multiple lesions. She follows up with Dr. [name]. Smoking cessation is encouraged.  Mikle Bosworth.D.

## 2016-10-29 ENCOUNTER — Ambulatory Visit (HOSPITAL_COMMUNITY)
Admission: RE | Admit: 2016-10-29 | Discharge: 2016-10-29 | Disposition: A | Discharge status: Home / Self Care | Payer: Medicare HMO | Source: Ambulatory Visit | Attending: Family | Admitting: Family

## 2016-10-29 ENCOUNTER — Ambulatory Visit (INDEPENDENT_AMBULATORY_CARE_PROVIDER_SITE_OTHER)
Admission: RE | Admit: 2016-10-29 | Discharge: 2016-10-29 | Disposition: A | Discharge status: Home / Self Care | Payer: Medicare HMO | Source: Ambulatory Visit | Attending: Family | Admitting: Family

## 2016-10-29 ENCOUNTER — Encounter: Payer: Self-pay | Admitting: Family

## 2016-10-29 ENCOUNTER — Ambulatory Visit (INDEPENDENT_AMBULATORY_CARE_PROVIDER_SITE_OTHER): Payer: Medicare HMO | Admitting: Family

## 2016-10-29 VITALS — BP 136/67 | HR 51 | Temp 97.3°F | Resp 18 | Ht 65.0 in | Wt 142.5 lb

## 2016-10-29 DIAGNOSIS — I6521 Occlusion and stenosis of right carotid artery: Secondary | ICD-10-CM | POA: Insufficient documentation

## 2016-10-29 DIAGNOSIS — Z87891 Personal history of nicotine dependence: Secondary | ICD-10-CM | POA: Diagnosis not present

## 2016-10-29 DIAGNOSIS — Z48812 Encounter for surgical aftercare following surgery on the circulatory system: Secondary | ICD-10-CM | POA: Insufficient documentation

## 2016-10-29 DIAGNOSIS — E1151 Type 2 diabetes mellitus with diabetic peripheral angiopathy without gangrene: Secondary | ICD-10-CM

## 2016-10-29 DIAGNOSIS — I6523 Occlusion and stenosis of bilateral carotid arteries: Secondary | ICD-10-CM

## 2016-10-29 DIAGNOSIS — Z9889 Other specified postprocedural states: Secondary | ICD-10-CM

## 2016-10-29 DIAGNOSIS — I70213 Atherosclerosis of native arteries of extremities with intermittent claudication, bilateral legs: Secondary | ICD-10-CM

## 2016-10-29 LAB — VAS US CAROTID
LCCADDIAS: 6 cm/s
LCCADSYS: 63 cm/s
LCCAPDIAS: 17 cm/s
LCCAPSYS: 141 cm/s
LICADDIAS: -14 cm/s
Left ICA dist sys: -43 cm/s
Left ICA prox dias: -8 cm/s
Left ICA prox sys: -40 cm/s
RCCADSYS: -96 cm/s
RIGHT CCA MID DIAS: -8 cm/s
RIGHT VERTEBRAL DIAS: 15 cm/s
Right CCA prox dias: -5 cm/s
Right CCA prox sys: -114 cm/s

## 2016-10-29 NOTE — Patient Instructions (Signed)
Intermittent Claudication Intermittent claudication is pain in your leg that occurs when you walk or exercise and goes away when you rest. The pain can occur in one or both legs. What are the causes? Intermittent claudication is caused by the buildup of plaque within the major arteries in the body (atherosclerosis). The plaque, which makes arteries stiff and narrow, prevents enough blood from reaching your leg muscles. The pain occurs when you walk or exercise because your muscles need more blood when you are moving and exercising. What increases the risk? Risk factors include:  A family history of atherosclerosis.  A personal history of stroke or heart disease.  Older age.  Being inactive or overweight.  Smoking cigarettes.  Having another health condition such as: ? Diabetes. ? High blood pressure. ? High cholesterol.  What are the signs or symptoms? Your hip or leg may:  Ache.  Cramp.  Feel tight.  Feel weak.  Feel heavy.  Over time, you may feel pain in your calf, thigh, or hip. How is this diagnosed? Your health care provider may diagnose intermittent claudication based on your symptoms and medical history. Your health care provider may also do tests to learn more about your condition. These may include:  Blood tests.  An ultrasound.  Imaging tests such as angiography, magnetic resonance angiography (MRA), and computed tomography angiography (CTA).  How is this treated? You may be treated for problems such as:  High blood pressure.  High cholesterol.  Diabetes.  Other treatments may include:  Lifestyle changes such as: ? Starting an exercise program. ? Losing weight. ? Quitting smoking.  Medicines to help restore blood flow through your legs.  Blood vessel surgery (angioplasty) to restore blood flow if your intermittent claudication is caused by severe peripheral artery disease.  Follow these instructions at home:  Manage any other health  conditions you have.  Eat a diet low in saturated fats and calories to maintain a healthy weight.  Quit smoking, if you smoke.  Take medicines only as directed by your health care provider.  If your health care provider recommended an exercise program for you, follow it as directed. Your exercise program may involve: ? Walking three or more times a week. ? Walking until you have certain symptoms of intermittent claudication. ? Resting until symptoms go away. ? Gradually increasing walking time to about 50 minutes a day. Contact a health care provider if: Your condition is not getting better or is getting worse. Get help right away if:  You have chest pain.  You have difficulty breathing.  You develop arm weakness.  You have trouble speaking.  Your face begins to droop. This information is not intended to replace advice given to you by your health care provider. Make sure you discuss any questions you have with your health care provider. Document Released: 11/21/2003 Document Revised: 06/17/2015 Document Reviewed: 04/26/2013 Elsevier Interactive Patient Education  2017 [employer].    Stroke Prevention Some medical conditions and behaviors are associated with an increased chance of having a stroke. You may prevent a stroke by making healthy choices and managing medical conditions. How can I reduce my risk of having a stroke?  Stay physically active. Get at least 30 minutes of activity on most or all days.  Do not smoke. It may also be helpful to avoid exposure to secondhand smoke.  Limit alcohol use. Moderate alcohol use is considered to be: ? No more than 2 drinks per day for men. ? No more than  1 drink per day for nonpregnant women.  Eat healthy foods. This involves: ? Eating 5 or more servings of fruits and vegetables a day. ? Making dietary changes that address high blood pressure (hypertension), high cholesterol, diabetes, or obesity.  Manage your cholesterol  levels. ? Making food choices that are high in fiber and low in saturated fat, trans fat, and cholesterol may control cholesterol levels. ? Take any prescribed medicines to control cholesterol as directed by your health care provider.  Manage your diabetes. ? Controlling your carbohydrate and sugar intake is recommended to manage diabetes. ? Take any prescribed medicines to control diabetes as directed by your health care provider.  Control your hypertension. ? Making food choices that are low in salt (sodium), saturated fat, trans fat, and cholesterol is recommended to manage hypertension. ? Ask your health care provider if you need treatment to lower your blood pressure. Take any prescribed medicines to control hypertension as directed by your health care provider. ? If you are 34-59 years of age, have your blood pressure checked every 3-5 years. If you are 48 years of age or older, have your blood pressure checked every year.  Maintain a healthy weight. ? Reducing calorie intake and making food choices that are low in sodium, saturated fat, trans fat, and cholesterol are recommended to manage weight.  Stop drug abuse.  Avoid taking birth control pills. ? Talk to your health care provider about the risks of taking birth control pills if you are over 69 years old, smoke, get migraines, or have ever had a blood clot.  Get evaluated for sleep disorders (sleep apnea). ? Talk to your health care provider about getting a sleep evaluation if you snore a lot or have excessive sleepiness.  Take medicines only as directed by your health care provider. ? For some people, aspirin or blood thinners (anticoagulants) are helpful in reducing the risk of forming abnormal blood clots that can lead to stroke. If you have the irregular heart rhythm of atrial fibrillation, you should be on a blood thinner unless there is a good reason you cannot take them. ? Understand all your medicine instructions.  Make  sure that other conditions (such as anemia or atherosclerosis) are addressed. Get help right away if:  You have sudden weakness or numbness of the face, arm, or leg, especially on one side of the body.  Your face or eyelid droops to one side.  You have sudden confusion.  You have trouble speaking (aphasia) or understanding.  You have sudden trouble seeing in one or both eyes.  You have sudden trouble walking.  You have dizziness.  You have a loss of balance or coordination.  You have a sudden, severe headache with no known cause.  You have new chest pain or an irregular heartbeat. Any of these symptoms may represent a serious problem that is an emergency. Do not wait to see if the symptoms will go away. Get medical help at once. Call your local emergency services (911 in [place] Do not drive yourself to the hospital. This information is not intended to replace advice given to you by your health care provider. Make sure you discuss any questions you have with your health care provider. Document Released: 02/26/2004 Document Revised: 2020-07-1115 Document Reviewed: 07/21/2012 Elsevier Interactive Patient Education  2017 [employer].

## 2016-10-29 NOTE — Progress Notes (Addendum)
[facility] [place] HISTORY AND PHYSICAL   MRN : [id number]  History of Present Illness:   SHIMIKA AMES is a 71 y.o. female who is s/p right CEA on 01/05/2012, and left CEA using patch angioplasty Vascu-Guard bovine patch 1cm x 6cmon 02/08/2012  by Dr. [name].  She also has PAD. She returns today for follow up.  The patient has no hx of amaurosis fugax or monocular blindness, hemiplegia, or receptive or expressive aphasia. The patient previously had some L corner of mouth droop after her R CEA, which has since resolved.  She continues to have some non-lifestyle limiting intermittent claudication. She has started a walking plan. She notes some intermittent throbbing pain in her right foot at rest, not with walking.  She complains of bilateral calf/thigh/buttocks pain in about 5 minutes with walking at times. Pt denies non healing wounds.    At her visit with Dr. [name] on 01/26/2013 his assessment was the following:  Her sx pattern for her Left leg and ABI are not consistent with L PAD as the etiology for her sx. I would consider spinal evaluation with +/- EMG/NCV studies. The patient needs to stop smoking otherwise her carotid arteries are likely to restenose and her PAD will progress with time. The pt's prior left corner of mouth drooping is NOT consistent with marginal mandibular drooping as it was CONTRALATERAL to the R CEA, suggesting possible small contralateral CVA.   Pt denies any subsequent stroke or TIA symptoms.  She denies steal symptoms in either upper extremity, denies dizziness.  She has CKD. Her last eGFR result on file was 33 on 10-26-16, serum creatinine was 1.77 (improved from 2.06), improved from stage 4 to stage 3B CKD.   She is scheduled for ICD placement on 11-05-16.   Pt Diabetic: Yes, states she does not recall her last A1C, not on file Pt smoker: smoker until February 2018 (1/3 ppd, started smoking at about age 50), states she stopped  several times  Pt meds include:  Statin :no Betablocker: Yes ASA: Yes Other anticoagulants/antiplatelets: no   Current Outpatient Prescriptions  Medication Sig Dispense Refill  . amLODipine (NORVASC) 10 MG tablet Take 10 mg by mouth daily.     Marland Kitchen aspirin EC 81 MG tablet Take 81 mg by mouth daily.      . cloNIDine (CATAPRES) 0.1 MG tablet Take 0.1 mg by mouth 2 (two) times daily.      . furosemide (LASIX) 80 MG tablet Take 1 tablet (80 mg total) by mouth 2 (two) times daily. 180 tablet 2  . isosorbide mononitrate (IMDUR) 30 MG 24 hr tablet Take 1 tablet (30 mg total) by mouth daily. 60 tablet 10  . metFORMIN (GLUCOPHAGE-XR) 500 MG 24 hr tablet Take 500 mg by mouth daily.    . metoprolol succinate (TOPROL-XL) 25 MG 24 hr tablet Take 1 tablet (25 mg total) by mouth daily. 60 tablet 10  . nitroGLYCERIN (NITROSTAT) 0.4 MG SL tablet Place 1 tablet (0.4 mg total) under the tongue every 5 (five) minutes as needed for chest pain. 25 tablet 3  . traMADol (ULTRAM) 50 MG tablet Take 1 tablet by mouth as directed.     No current facility-administered medications for this visit.     Past Medical History:  Diagnosis Date  . Adrenal tumor 08/2007   surgery  . Cancer of right breast [facility])    s/p lumpectomy, XRT  . Carotid artery disease (HCC)    s/p bilateral CEA  .  Hepatitis C    "got treatment for it" (01/29/2016)  . High cholesterol    takes Lopid daily  . History of UTI    takes Diflucan dail--pt states no uti in a couple of yrs though  . Hypertension    takes Losartan,Amlodipine,HCTZ,and Atenolol daily and Clatarpres  . Joint pain    legs  . Renal artery stenosis (HCC)    Right renal artery stent 05/31/06  . Stroke [facility]) Jan. 7, 2014  . Tobacco abuse   . Type II diabetes mellitus ([place])     Social History Social History  Substance Use Topics  . Smoking status: Former Smoker    Years: 53.00    Types: Cigarettes    Quit date: 03/2016  . Smokeless tobacco: Never Used  .  Alcohol use No    Family History Family History  Problem Relation Age of Onset  . Cancer Mother        ? type  . Cancer Father        ? type  . Diabetes Sister   . Hypertension Sister   . Diabetes Brother   . Hypertension Brother   . CAD Neg Hx     Surgical History Past Surgical History:  Procedure Laterality Date  . ABDOMINAL HYSTERECTOMY    . ADRENAL GLAND SURGERY  2009  . BREAST LUMPECTOMY Right 2009  . CARDIAC CATHETERIZATION N/A 02/03/2016   Procedure: Right/Left Heart Cath and Coronary Angiography;  Surgeon: Burnell Blanks, MD;  Location: [place] [facility];  Service: Cardiovascular;  Laterality: N/A;  . ENDARTERECTOMY  01/05/2012   Procedure: ENDARTERECTOMY CAROTID;  Surgeon: Conrad Kanarraville, MD;  Location: [place];  Service: Vascular;  Laterality: Right;  Right Carotid Artery Endarterectomy, right stump pressure, intraoperative ultrasound  . ENDARTERECTOMY  02/08/2012   Procedure: ENDARTERECTOMY CAROTID;  Surgeon: Conrad Lithopolis, MD;  Location: [place];  Service: Vascular;  Laterality: Left;  . LUMBAR DISC SURGERY    . PATCH ANGIOPLASTY  02/08/2012   Procedure: PATCH ANGIOPLASTY;  Surgeon: Conrad [place] Canyon, MD;  Location: [facility];  Service: Vascular;  Laterality: Left;  Carotid artery patch angioplasty using Vascu-Guard bovine patch 1cm x 6cm.  . RENAL ARTERY STENT Right 05/2006    Allergies  Allergen Reactions  . Chlorhexidine Gluconate Itching  . Ace Inhibitors Cough  . Dilaudid [Hydromorphone Hcl] Itching    Current Outpatient Prescriptions  Medication Sig Dispense Refill  . amLODipine (NORVASC) 10 MG tablet Take 10 mg by mouth daily.     Marland Kitchen aspirin EC 81 MG tablet Take 81 mg by mouth daily.      . cloNIDine (CATAPRES) 0.1 MG tablet Take 0.1 mg by mouth 2 (two) times daily.      . furosemide (LASIX) 80 MG tablet Take 1 tablet (80 mg total) by mouth 2 (two) times daily. 180 tablet 2  . isosorbide mononitrate (IMDUR) 30 MG 24 hr tablet Take 1 tablet (30 mg total) by mouth  daily. 60 tablet 10  . metFORMIN (GLUCOPHAGE-XR) 500 MG 24 hr tablet Take 500 mg by mouth daily.    . metoprolol succinate (TOPROL-XL) 25 MG 24 hr tablet Take 1 tablet (25 mg total) by mouth daily. 60 tablet 10  . nitroGLYCERIN (NITROSTAT) 0.4 MG SL tablet Place 1 tablet (0.4 mg total) under the tongue every 5 (five) minutes as needed for chest pain. 25 tablet 3  . traMADol (ULTRAM) 50 MG tablet Take 1 tablet by mouth as directed.  No current facility-administered medications for this visit.      REVIEW OF SYSTEMS: See HPI for pertinent positives and negatives.  Physical Examination Vitals:   10/29/16 1259 10/29/16 1303  BP: 136/67 136/67  Pulse: (!) 51   Resp: 18   Temp: (!) 97.3 F (36.3 C)   TempSrc: Oral   SpO2: 99%   Weight: 142 lb 8 oz (64.6 kg)   Height: 5' 5"  (1.651 m)    Body mass index is 23.71 kg/m.  General: WDWN in NAD Gait: Normal HENT: No gross abnormalities.  Eyes: PERRLA Pulmonary: normal non-labored breathing, no rales, rhonchi, or wheezing, diminished breath sounds in all fields Cardiac: RRR, no murmur detected  Abdomen: soft, NT, no palpable masses Skin: no rashes, no ulcers, no cellulitis.  VASCULAR EXAM  Carotid Bruits Left Right   Positive Positive    Radial pulses are 1+ palpable and = Abdominal aortic pulse is not palpable.   VASCULAR EXAM: Extremitieswithout ischemic changes  without Gangrene; without open wounds.     LE Pulses LEFT RIGHT   FEMORAL 1+ palpable 1+ palpable    POPLITEAL not palpable  not palpable   POSTERIOR TIBIAL not palpable  not palpable    DORSALIS PEDIS  ANTERIOR TIBIAL faintly palpable  1+ palpable     Musculoskeletal: no muscle wasting or  atrophy; no peripheral edema Neurologic:A&O X 3; appropriate affect; sensation is normal, MOTOR FUNCTION: 5/5 Symmetric, CN 2-12 intact, speech is fluent/normal     ASSESSMENT:  ALLANI REBER is a 71 y.o. female who is s/p right CEA on 01/05/2012, and left CEA using patch angioplasty Vascu-Guard bovine patch 1cm x 6cmon 02/08/2012.  She has not had a TIA or stroke since 2013. She also has PAOD. She has moderate claudication, no signs of ischemia in her feet/legs.She has improved her efforts at a graduated walking program.   Her atherosclerotic risk factors include controlled DM,  former smoker x 53 years until February 2018, COPD, and stage 4 CKD   DATA  Carotid Duplex (10/29/16): Right ICA: CEA site with hyperplasia in the proximal segment, 1-39% stenosis.  Left ICA: CEA site with no restenosis.  Bilateral vertebral artery flow is antegrade.  Bilateral subclavian artery waveforms are normal.  New finding of right ICA hyperplasia compared to the last exam on 09-19-15.   ABI (Date: 10/29/2016):  R:   ABI: 0.58 (was 0.51 on 04-27-16),   PT: mono  DP: mono  TBI:  0.44 (was 0.36)  L:   ABI: 0.54 (was 0.43),   PT: mono  DP: mono  TBI: 0.40 (was 0.29) Slightly improved bilateral ABI and TBI, moderate disease with all monophasic waveforms.   PLAN:   Graduated walking program discussed and how to achieve.   Based on today's exam and non-invasive vascular lab results, the patient will follow up in 1 year with the following tests: ABI's, carotid duplex.  I discussed in depth with the patient the nature of atherosclerosis, and emphasized the importance of maximal medical management including strict control of blood pressure, blood glucose, and lipid levels, obtaining regular exercise, and cessation of smoking.  The patient is aware that without maximal medical management the underlying atherosclerotic disease process will progress, limiting the benefit of any  interventions.  The patient was given information about stroke prevention and what symptoms should prompt the patient to seek immediate medical care.  The patient was given information about PAD including signs, symptoms, treatment, what symptoms should prompt the patient to  seek immediate medical care, and risk reduction measures to take.  Thank you for allowing [place] to participate in this patient's care.  Clemon Chambers, RN, MSN, FNP-C [facility] Office: [phone]  Clinic MD: Cain/Chen 10/29/2016 1:12 PM

## 2016-11-05 ENCOUNTER — Encounter (HOSPITAL_COMMUNITY): Payer: Self-pay

## 2016-11-05 ENCOUNTER — Encounter (HOSPITAL_COMMUNITY)
Admission: RE | Disposition: A | Discharge status: Home / Self Care | Payer: Self-pay | Source: Ambulatory Visit | Attending: Internal Medicine

## 2016-11-05 ENCOUNTER — Ambulatory Visit (HOSPITAL_COMMUNITY)
Admission: RE | Admit: 2016-11-05 | Discharge: 2016-11-06 | Disposition: A | Discharge status: Home / Self Care | Payer: Medicare HMO | Source: Ambulatory Visit | Attending: Internal Medicine | Admitting: Internal Medicine

## 2016-11-05 DIAGNOSIS — Z95810 Presence of automatic (implantable) cardiac defibrillator: Secondary | ICD-10-CM

## 2016-11-05 DIAGNOSIS — Z8673 Personal history of transient ischemic attack (TIA), and cerebral infarction without residual deficits: Secondary | ICD-10-CM | POA: Diagnosis not present

## 2016-11-05 DIAGNOSIS — I255 Ischemic cardiomyopathy: Secondary | ICD-10-CM | POA: Insufficient documentation

## 2016-11-05 DIAGNOSIS — Z7984 Long term (current) use of oral hypoglycemic drugs: Secondary | ICD-10-CM | POA: Diagnosis not present

## 2016-11-05 DIAGNOSIS — I252 Old myocardial infarction: Secondary | ICD-10-CM | POA: Diagnosis not present

## 2016-11-05 DIAGNOSIS — E1151 Type 2 diabetes mellitus with diabetic peripheral angiopathy without gangrene: Secondary | ICD-10-CM | POA: Diagnosis not present

## 2016-11-05 DIAGNOSIS — I130 Hypertensive heart and chronic kidney disease with heart failure and stage 1 through stage 4 chronic kidney disease, or unspecified chronic kidney disease: Secondary | ICD-10-CM | POA: Insufficient documentation

## 2016-11-05 DIAGNOSIS — E7800 Pure hypercholesterolemia, unspecified: Secondary | ICD-10-CM | POA: Insufficient documentation

## 2016-11-05 DIAGNOSIS — Z7982 Long term (current) use of aspirin: Secondary | ICD-10-CM | POA: Insufficient documentation

## 2016-11-05 DIAGNOSIS — I452 Bifascicular block: Secondary | ICD-10-CM | POA: Insufficient documentation

## 2016-11-05 DIAGNOSIS — N184 Chronic kidney disease, stage 4 (severe): Secondary | ICD-10-CM | POA: Diagnosis not present

## 2016-11-05 DIAGNOSIS — Z87891 Personal history of nicotine dependence: Secondary | ICD-10-CM | POA: Diagnosis not present

## 2016-11-05 DIAGNOSIS — B1920 Unspecified viral hepatitis C without hepatic coma: Secondary | ICD-10-CM | POA: Diagnosis not present

## 2016-11-05 DIAGNOSIS — I5022 Chronic systolic (congestive) heart failure: Secondary | ICD-10-CM | POA: Diagnosis present

## 2016-11-05 DIAGNOSIS — Z006 Encounter for examination for normal comparison and control in clinical research program: Secondary | ICD-10-CM | POA: Diagnosis not present

## 2016-11-05 DIAGNOSIS — Z853 Personal history of malignant neoplasm of breast: Secondary | ICD-10-CM | POA: Diagnosis not present

## 2016-11-05 DIAGNOSIS — E1122 Type 2 diabetes mellitus with diabetic chronic kidney disease: Secondary | ICD-10-CM | POA: Insufficient documentation

## 2016-11-05 DIAGNOSIS — I2510 Atherosclerotic heart disease of native coronary artery without angina pectoris: Secondary | ICD-10-CM | POA: Insufficient documentation

## 2016-11-05 DIAGNOSIS — Z8249 Family history of ischemic heart disease and other diseases of the circulatory system: Secondary | ICD-10-CM | POA: Insufficient documentation

## 2016-11-05 DIAGNOSIS — I447 Left bundle-branch block, unspecified: Secondary | ICD-10-CM | POA: Diagnosis not present

## 2016-11-05 DIAGNOSIS — I425 Other restrictive cardiomyopathy: Secondary | ICD-10-CM | POA: Diagnosis not present

## 2016-11-05 HISTORY — DX: Unspecified systolic (congestive) heart failure: I50.20

## 2016-11-05 HISTORY — DX: Chronic systolic (congestive) heart failure: I50.22

## 2016-11-05 HISTORY — DX: Ischemic cardiomyopathy: I25.5

## 2016-11-05 HISTORY — PX: BIV ICD INSERTION CRT-D: EP1195

## 2016-11-05 LAB — GLUCOSE, CAPILLARY
Glucose-Capillary: 134 mg/dL — ABNORMAL HIGH (ref 65–99)
Glucose-Capillary: 116 mg/dL — ABNORMAL HIGH (ref 65–99)
Glucose-Capillary: 186 mg/dL — ABNORMAL HIGH (ref 65–99)

## 2016-11-05 LAB — SURGICAL PCR SCREEN
MRSA, PCR: NEGATIVE
STAPHYLOCOCCUS AUREUS: NEGATIVE

## 2016-11-05 SURGERY — BIV ICD INSERTION CRT-D

## 2016-11-05 MED ORDER — MUPIROCIN 2 % EX OINT
TOPICAL_OINTMENT | CUTANEOUS | Status: AC
  Filled 2016-11-05: qty 22

## 2016-11-05 MED ORDER — METOPROLOL SUCCINATE ER 25 MG PO TB24
25.0000 mg | ORAL_TABLET | Freq: Every day | ORAL | Status: DC
  Administered 2016-11-05 – 2016-11-06 (×2): 25 mg via ORAL
  Filled 2016-11-05 (×2): qty 1

## 2016-11-05 MED ORDER — HEPARIN (PORCINE) IN NACL 2-0.9 UNIT/ML-% IJ SOLN
INTRAMUSCULAR | Status: AC | PRN
  Administered 2016-11-05: 500 mL

## 2016-11-05 MED ORDER — CEFAZOLIN SODIUM-DEXTROSE 2-4 GM/100ML-% IV SOLN
INTRAVENOUS | Status: AC
  Filled 2016-11-05: qty 100

## 2016-11-05 MED ORDER — INFLUENZA VAC SPLIT HIGH-DOSE 0.5 ML IM SUSY
0.5000 mL | PREFILLED_SYRINGE | INTRAMUSCULAR | Status: DC
  Filled 2016-11-05: qty 0.5

## 2016-11-05 MED ORDER — FUROSEMIDE 80 MG PO TABS
80.0000 mg | ORAL_TABLET | Freq: Two times a day (BID) | ORAL | Status: DC
  Administered 2016-11-05 – 2016-11-06 (×2): 80 mg via ORAL
  Filled 2016-11-05 (×2): qty 1

## 2016-11-05 MED ORDER — IOPAMIDOL (ISOVUE-370) INJECTION 76%
INTRAVENOUS | Status: DC | PRN
  Administered 2016-11-05: 30 mL via INTRAVENOUS

## 2016-11-05 MED ORDER — MIDAZOLAM HCL 5 MG/5ML IJ SOLN
INTRAMUSCULAR | Status: DC | PRN
  Administered 2016-11-05 (×9): 1 mg via INTRAVENOUS

## 2016-11-05 MED ORDER — SODIUM CHLORIDE 0.9 % IR SOLN
Status: AC
  Filled 2016-11-05: qty 2

## 2016-11-05 MED ORDER — FENTANYL CITRATE (PF) 100 MCG/2ML IJ SOLN
INTRAMUSCULAR | Status: AC
  Filled 2016-11-05: qty 2

## 2016-11-05 MED ORDER — SODIUM CHLORIDE 0.9 % IR SOLN
80.0000 mg | Status: AC
  Administered 2016-11-05: 80 mg

## 2016-11-05 MED ORDER — HEPARIN (PORCINE) IN NACL 2-0.9 UNIT/ML-% IJ SOLN
INTRAMUSCULAR | Status: AC
  Filled 2016-11-05: qty 500

## 2016-11-05 MED ORDER — MIDAZOLAM HCL 5 MG/5ML IJ SOLN
INTRAMUSCULAR | Status: AC
  Filled 2016-11-05: qty 5

## 2016-11-05 MED ORDER — NITROGLYCERIN 0.4 MG SL SUBL: 0.4000 mg | SUBLINGUAL_TABLET | SUBLINGUAL | Status: DC | PRN

## 2016-11-05 MED ORDER — CLONIDINE HCL 0.1 MG PO TABS
0.1000 mg | ORAL_TABLET | Freq: Two times a day (BID) | ORAL | Status: DC
  Administered 2016-11-05 – 2016-11-06 (×3): 0.1 mg via ORAL
  Filled 2016-11-05 (×3): qty 1

## 2016-11-05 MED ORDER — TRAMADOL HCL 50 MG PO TABS
50.0000 mg | ORAL_TABLET | Freq: Two times a day (BID) | ORAL | Status: DC | PRN
  Administered 2016-11-05 – 2016-11-06 (×2): 50 mg via ORAL
  Filled 2016-11-05 (×2): qty 1

## 2016-11-05 MED ORDER — SODIUM CHLORIDE 0.9 % IV SOLN
INTRAVENOUS | Status: DC
  Administered 2016-11-05: 09:00:00 via INTRAVENOUS

## 2016-11-05 MED ORDER — BUPIVACAINE HCL (PF) 0.25 % IJ SOLN
INTRAMUSCULAR | Status: AC
  Filled 2016-11-05: qty 30

## 2016-11-05 MED ORDER — BUPIVACAINE HCL (PF) 0.25 % IJ SOLN
INTRAMUSCULAR | Status: DC | PRN
  Administered 2016-11-05: 45 mL

## 2016-11-05 MED ORDER — ISOSORBIDE MONONITRATE ER 30 MG PO TB24
30.0000 mg | ORAL_TABLET | Freq: Every day | ORAL | Status: DC
  Administered 2016-11-05 – 2016-11-06 (×2): 30 mg via ORAL
  Filled 2016-11-05 (×2): qty 1

## 2016-11-05 MED ORDER — CEFAZOLIN SODIUM-DEXTROSE 2-4 GM/100ML-% IV SOLN
2.0000 g | INTRAVENOUS | Status: AC
  Administered 2016-11-05: 2 g via INTRAVENOUS

## 2016-11-05 MED ORDER — IOPAMIDOL (ISOVUE-370) INJECTION 76%
INTRAVENOUS | Status: AC
  Filled 2016-11-05: qty 50

## 2016-11-05 MED ORDER — AMLODIPINE BESYLATE 10 MG PO TABS
10.0000 mg | ORAL_TABLET | Freq: Every day | ORAL | Status: DC
  Administered 2016-11-05 – 2016-11-06 (×2): 10 mg via ORAL
  Filled 2016-11-05 (×2): qty 1

## 2016-11-05 MED ORDER — ASPIRIN EC 81 MG PO TBEC
81.0000 mg | DELAYED_RELEASE_TABLET | Freq: Every day | ORAL | Status: DC
  Administered 2016-11-05 – 2016-11-06 (×2): 81 mg via ORAL
  Filled 2016-11-05: qty 1

## 2016-11-05 MED ORDER — ONDANSETRON HCL 4 MG/2ML IJ SOLN: 4.0000 mg | Freq: Four times a day (QID) | INTRAMUSCULAR | Status: DC | PRN

## 2016-11-05 MED ORDER — CEFAZOLIN SODIUM-DEXTROSE 1-4 GM/50ML-% IV SOLN
1.0000 g | Freq: Two times a day (BID) | INTRAVENOUS | Status: AC
  Administered 2016-11-05 – 2016-11-06 (×2): 1 g via INTRAVENOUS
  Filled 2016-11-05 (×2): qty 50

## 2016-11-05 MED ORDER — ACETAMINOPHEN 325 MG PO TABS
325.0000 mg | ORAL_TABLET | ORAL | Status: DC | PRN
  Administered 2016-11-05 – 2016-11-06 (×2): 650 mg via ORAL
  Filled 2016-11-05 (×2): qty 2

## 2016-11-05 MED ORDER — FENTANYL CITRATE (PF) 100 MCG/2ML IJ SOLN
INTRAMUSCULAR | Status: DC | PRN
  Administered 2016-11-05 (×3): 12.5 ug via INTRAVENOUS
  Administered 2016-11-05: 25 ug via INTRAVENOUS
  Administered 2016-11-05 (×4): 12.5 ug via INTRAVENOUS

## 2016-11-05 SURGICAL SUPPLY — 19 items
CABLE SURGICAL S-101-97-12 (CABLE) ×2 IMPLANT
CATH ATTAIN COM SURV 6250V-45S (CATHETERS) ×2 IMPLANT
CATH ATTAIN COM SURV 6250V-EH (CATHETERS) ×2 IMPLANT
CATH ATTAIN COM SURV 6250V-MB2 (CATHETERS) ×3 IMPLANT
CATH HEX JOSEPH 2-5-2 65CM 6F (CATHETERS) ×2 IMPLANT
CATH RIGHTSITE C315HIS02 (CATHETERS) ×4 IMPLANT
ICD CLARIA MRI DTMA1D1 (ICD Generator) ×2 IMPLANT
LEAD CAPSURE NOVUS 45CM (Lead) ×2 IMPLANT
LEAD SELECT SECURE 3830 383069 (Lead) IMPLANT
LEAD SPRINT QUAT SEC 6935-58CM (Lead) ×2 IMPLANT
PAD DEFIB LIFELINK (PAD) ×2 IMPLANT
SELECT SECURE 3830 383069 (Lead) ×3 IMPLANT
SHEATH CLASSIC 7F (SHEATH) ×2 IMPLANT
SHEATH CLASSIC 9.5F (SHEATH) ×2 IMPLANT
SHEATH CLASSIC 9F (SHEATH) ×3 IMPLANT
SLITTER 6232ADJ (MISCELLANEOUS) ×2 IMPLANT
TRAY PACEMAKER INSERTION (PACKS) ×2 IMPLANT
WIRE HITORQ VERSACORE ST 145CM (WIRE) ×2 IMPLANT
WIRE MAILMAN 182CM (WIRE) ×2 IMPLANT

## 2016-11-05 NOTE — Discharge Summary (Signed)
ELECTROPHYSIOLOGY PROCEDURE DISCHARGE SUMMARY    Patient ID: Kristen Bradley,  MRN: [id number], DOB/AGE: [birth year] 71 y.o.  Admit date: 11/05/2016 Discharge date: 11/06/2016  Primary Care Physician: [name], MD Primary Cardiologist: Angelena Form Electrophysiologist: Lovena Le  Primary Discharge Diagnosis:  ICM, CHF, LBBB s/p CRTD implant this admission  Secondary Discharge Diagnosis:  1.  Breast cancer 2.  CAD 3.  Hepatitis C 4.  Diabetes 5.  Prior CVA 6.  CKD III-IV  Allergies  Allergen Reactions  . Chlorhexidine Gluconate Itching  . Ace Inhibitors Cough  . Dilaudid [Hydromorphone Hcl] Itching     Procedures This Admission:  1.  Implantation of a MDT CRTD on 11/05/16 by Dr Lovena Le. See op note for full details. There were no immediate post procedure complications. 2.  CXR on 11/06/16 demonstrated no pneumothorax status post device implantation.   Brief HPI: Kristen Bradley is a 71 y.o. female was referred to electrophysiology in the outpatient setting for consideration of ICD implantation.  Past medical history includes ICM, CHF, LBBB.  The patient has persistent LV dysfunction despite guideline directed therapy.  Risks, benefits, and alternatives to ICD implantation were reviewed with the patient who wished to proceed.   Hospital Course:  The patient was admitted and underwent implantation of a MDT CRTD with details as outlined above. She was monitored on telemetry overnight which demonstrated RSR with His bundle pacing.  Left chest was without hematoma or ecchymosis.  The device was interrogated and found to be functioning normally.  CXR was obtained and demonstrated no pneumothorax status post device implantation.  Wound care, arm mobility, and restrictions were reviewed with the patient.  The patient was examined and considered stable for discharge to home.   The patient's discharge medications include a beta blocker (Metoprolol). She is not on an ACEi/ARB/ARNi/MRA  secondary to h/o CKD III-IV.  Physical Exam: Vitals:   11/05/16 2209 11/06/16 0455 11/06/16 0912 11/06/16 0939  BP: (!) 154/70 (!) 150/73 (!) 158/70 (!) 158/70  Pulse: 62 (!) 59 63 64  Resp: 17  14   Temp: 98.5 F (36.9 C) 97.8 F (36.6 C) 98.1 F (36.7 C)   TempSrc: Oral Oral Oral   SpO2: 98% 98% 98%   Weight:      Height:        Labs:   Lab Results  Component Value Date   WBC 4.8 10/26/2016   HGB 10.9 (L) 10/26/2016   HCT 32.4 (L) 10/26/2016   MCV 87 10/26/2016   PLT 117 (L) 10/26/2016    Discharge Medications:  Allergies as of 11/06/2016      Reactions   Chlorhexidine Gluconate Itching   Ace Inhibitors Cough   Dilaudid [hydromorphone Hcl] Itching      Medication List    TAKE these medications   amLODipine 10 MG tablet Commonly known as:  NORVASC Take 10 mg by mouth daily.   aspirin EC 81 MG tablet Take 81 mg by mouth daily.   cloNIDine 0.1 MG tablet Commonly known as:  CATAPRES Take 0.1 mg by mouth 2 (two) times daily.   furosemide 80 MG tablet Commonly known as:  LASIX Take 1 tablet (80 mg total) by mouth 2 (two) times daily.   isosorbide mononitrate 30 MG 24 hr tablet Commonly known as:  IMDUR Take 1 tablet (30 mg total) by mouth daily.   metFORMIN 500 MG 24 hr tablet Commonly known as:  GLUCOPHAGE-XR Take 500 mg by mouth daily.   metoprolol succinate  25 MG 24 hr tablet Commonly known as:  TOPROL-XL Take 1 tablet (25 mg total) by mouth daily.   nitroGLYCERIN 0.4 MG SL tablet Commonly known as:  NITROSTAT Place 1 tablet (0.4 mg total) under the tongue every 5 (five) minutes as needed for chest pain.   traMADol 50 MG tablet Commonly known as:  ULTRAM Take 1 tablet by mouth as directed.       Disposition:   Follow-up Information    [facility] Follow up on 11/15/2016.   Specialty:  Cardiology Why:  at [facility] information: [address], [place] [address] [phone]            Duration of Discharge Encounter: Greater than 30 minutes including physician time.  Signed, Murray Hodgkins, NP 11/06/2016 10:21 AM  EP Attending  Patient seen and examined. Agree with above. See my note as well. She is stable for discharge home. Usual followup.   Mikle Bosworth.D.

## 2016-11-05 NOTE — H&P (View-Only) (Signed)
HPI [name] is referred today to consider prophylactic ICD implantation. The patient is a very pleasant 71 year old woman with a history of an ischemic cardiomyopathy, chronic systolic heart failure, class III, left bundle branch block, carotid vascular disease, peripheral vascular disease, severe left ventricular dysfunction, ejection fraction 25%. She was not thought to be a good candidate for bypass surgery secondary to severe and diffuse disease. She has not had syncope. Allergies  Allergen Reactions  . Chlorhexidine Gluconate Itching  . Ace Inhibitors Cough  . Dilaudid [Hydromorphone Hcl] Itching     Current Outpatient Prescriptions  Medication Sig Dispense Refill  . amLODipine (NORVASC) 10 MG tablet Take 10 mg by mouth daily.     Marland Kitchen aspirin EC 81 MG tablet Take 81 mg by mouth daily.      . cloNIDine (CATAPRES) 0.1 MG tablet Take 0.1 mg by mouth 2 (two) times daily.      . furosemide (LASIX) 80 MG tablet Take 1 tablet (80 mg total) by mouth 2 (two) times daily. 180 tablet 2  . isosorbide mononitrate (IMDUR) 30 MG 24 hr tablet Take 1 tablet (30 mg total) by mouth daily. 60 tablet 10  . metFORMIN (GLUCOPHAGE-XR) 500 MG 24 hr tablet Take 500 mg by mouth daily.    . metoprolol succinate (TOPROL-XL) 25 MG 24 hr tablet Take 1 tablet (25 mg total) by mouth daily. 60 tablet 10  . nitroGLYCERIN (NITROSTAT) 0.4 MG SL tablet Place 1 tablet (0.4 mg total) under the tongue every 5 (five) minutes as needed for chest pain. 25 tablet 3  . traMADol (ULTRAM) 50 MG tablet Take 1 tablet by mouth as directed.     No current facility-administered medications for this visit.      Past Medical History:  Diagnosis Date  . Adrenal tumor 08/2007   surgery  . Cancer of right breast [facility])    s/p lumpectomy, XRT  . Carotid artery disease (HCC)    s/p bilateral CEA  . Hepatitis C    "got treatment for it" (01/29/2016)  . High cholesterol    takes Lopid daily  . History of UTI    takes  Diflucan dail--pt states no uti in a couple of yrs though  . Hypertension    takes Losartan,Amlodipine,HCTZ,and Atenolol daily and Clatarpres  . Joint pain    legs  . Renal artery stenosis (HCC)    Right renal artery stent 05/31/06  . Stroke [facility]) Jan. 7, 2014  . Tobacco abuse   . Type II diabetes mellitus (HCC)     ROS:   All systems reviewed and negative except as noted in the HPI.   Past Surgical History:  Procedure Laterality Date  . ABDOMINAL HYSTERECTOMY    . ADRENAL GLAND SURGERY  2009  . BREAST LUMPECTOMY Right 2009  . CARDIAC CATHETERIZATION N/A 02/03/2016   Procedure: Right/Left Heart Cath and Coronary Angiography;  Surgeon: Burnell Blanks, MD;  Location: [place] [facility];  Service: Cardiovascular;  Laterality: N/A;  . ENDARTERECTOMY  01/05/2012   Procedure: ENDARTERECTOMY CAROTID;  Surgeon: Conrad Turner, MD;  Location: [facility];  Service: Vascular;  Laterality: Right;  Right Carotid Artery Endarterectomy, right stump pressure, intraoperative ultrasound  . ENDARTERECTOMY  02/08/2012   Procedure: ENDARTERECTOMY CAROTID;  Surgeon: Conrad Hillsville, MD;  Location: [place];  Service: Vascular;  Laterality: Left;  . LUMBAR DISC SURGERY    . PATCH ANGIOPLASTY  02/08/2012   Procedure: PATCH ANGIOPLASTY;  Surgeon: Aaron Edelman  Starlyn Skeans, MD;  Location: [place] [facility];  Service: Vascular;  Laterality: Left;  Carotid artery patch angioplasty using Vascu-Guard bovine patch 1cm x 6cm.  . RENAL ARTERY STENT Right 05/2006     Family History  Problem Relation Age of Onset  . Cancer Mother        ? type  . Cancer Father        ? type  . Diabetes Sister   . Hypertension Sister   . Diabetes Brother   . Hypertension Brother   . CAD Neg Hx      Social History   Social History  . Marital status: Divorced    Spouse name: N/A  . Number of children: 1  . Years of education: N/A   Occupational History  . Retired-truck driver Retired   Social History Main Topics  . Smoking status: Former Smoker     Years: 53.00    Types: Cigarettes    Quit date: 03/2016  . Smokeless tobacco: Never Used  . Alcohol use No  . Drug use: No  . Sexual activity: Not on file   Other Topics Concern  . Not on file   Social History Narrative  . No narrative on file     BP (!) 152/78   Pulse 77   Ht 5\' 5"  (1.651 m)   Wt 145 lb 9.6 oz (66 kg)   SpO2 98%   BMI 24.23 kg/m   Physical Exam:  Stable appearing 71 year old woman, NAD HEENT: Unremarkable Neck:  8 cm JVD, no thyromegally Lymphatics:  No adenopathy Back:  No CVA tenderness Lungs:  Clear, with rales in the bases, but no wheezes or rhonchi. HEART:  Regular rate rhythm, no murmurs, no rubs, no clicks, soft S3 is present, and PMI is laterally displaced. Abd:  soft, positive bowel sounds, no organomegally, no rebound, no guarding Ext:  2 plus pulses, no edema, no cyanosis, no clubbing Skin:  No rashes no nodules Neuro:  CN II through XII intact, motor grossly intact  EKG - reviewed - normal sinus rhythm with left bundle branch block, QRS duration 150 ms  DEVICE  Normal device function.  See PaceArt for details.   Assess/Plan: 1. Chronic systolic heart failure - she is on maximal medical therapy. I've discussed the treatment options in detail with the patient and her son. The risk, goals, benefits, and expectations of biventricular ICD insertion were reviewed and she wishes to proceed. 2. Ischemic cardiomyopathy - she has class II anginal symptoms. She will continue her current medications. I would hope to up titrate her beta blocker once her ICD is in place. 3. Peripheral vascular disease - she has multiple lesions. She follows up with Dr. [name]. Smoking cessation is encouraged.  Mikle Bosworth.D.

## 2016-11-05 NOTE — Interval H&P Note (Signed)
History and Physical Interval Note:  11/05/2016 11:55 AM  [name]  has presented today for surgery, with the diagnosis of cm - hf  The various methods of treatment have been discussed with the patient and family. After consideration of risks, benefits and other options for treatment, the patient has consented to  Procedure(s): BIV ICD INSERTION CRT-D (N/A) as a surgical intervention .  The patient's history has been reviewed, patient examined, no change in status, stable for surgery.  I have reviewed the patient's chart and labs.  Questions were answered to the patient's satisfaction.     Kristen Bradley

## 2016-11-06 ENCOUNTER — Ambulatory Visit (HOSPITAL_COMMUNITY): Payer: Medicare HMO

## 2016-11-06 ENCOUNTER — Encounter (HOSPITAL_COMMUNITY): Payer: Self-pay | Admitting: Nurse Practitioner

## 2016-11-06 DIAGNOSIS — I5022 Chronic systolic (congestive) heart failure: Secondary | ICD-10-CM

## 2016-11-06 DIAGNOSIS — Z006 Encounter for examination for normal comparison and control in clinical research program: Secondary | ICD-10-CM | POA: Diagnosis not present

## 2016-11-06 DIAGNOSIS — I252 Old myocardial infarction: Secondary | ICD-10-CM | POA: Diagnosis not present

## 2016-11-06 DIAGNOSIS — I2510 Atherosclerotic heart disease of native coronary artery without angina pectoris: Secondary | ICD-10-CM | POA: Diagnosis not present

## 2016-11-06 DIAGNOSIS — I130 Hypertensive heart and chronic kidney disease with heart failure and stage 1 through stage 4 chronic kidney disease, or unspecified chronic kidney disease: Secondary | ICD-10-CM | POA: Diagnosis not present

## 2016-11-06 DIAGNOSIS — Z4682 Encounter for fitting and adjustment of non-vascular catheter: Secondary | ICD-10-CM | POA: Diagnosis not present

## 2016-11-06 DIAGNOSIS — N184 Chronic kidney disease, stage 4 (severe): Secondary | ICD-10-CM | POA: Diagnosis not present

## 2016-11-06 DIAGNOSIS — I425 Other restrictive cardiomyopathy: Secondary | ICD-10-CM | POA: Diagnosis not present

## 2016-11-06 DIAGNOSIS — I452 Bifascicular block: Secondary | ICD-10-CM | POA: Diagnosis not present

## 2016-11-06 DIAGNOSIS — I447 Left bundle-branch block, unspecified: Secondary | ICD-10-CM | POA: Diagnosis not present

## 2016-11-06 DIAGNOSIS — I255 Ischemic cardiomyopathy: Secondary | ICD-10-CM | POA: Diagnosis not present

## 2016-11-06 DIAGNOSIS — E1122 Type 2 diabetes mellitus with diabetic chronic kidney disease: Secondary | ICD-10-CM | POA: Diagnosis not present

## 2016-11-06 NOTE — Discharge Instructions (Signed)
° ° °  Supplemental Discharge Instructions for  Pacemaker/Defibrillator Patients  Activity No heavy lifting or vigorous activity with your left/right arm for 6 to 8 weeks.  Do not raise your left/right arm above your head for one week.  Gradually raise your affected arm as drawn below.           __      11/09/16                 11/10/16                   11/11/16               11/12/16  NO DRIVING for 1 week    ; you may begin driving on  54/98/26   .  WOUND CARE - Keep the wound area clean and dry.  Do not get this area wet for one week. No showers for one week; you may shower on 11/12/16    . - The tape/steri-strips on your wound will fall off; do not pull them off.  No bandage is needed on the site.  DO  NOT apply any creams, oils, or ointments to the wound area. - If you notice any drainage or discharge from the wound, any swelling or bruising at the site, or you develop a fever > 101? F after you are discharged home, call the office at once.  Special Instructions - You are still able to use cellular telephones; use the ear opposite the side where you have your pacemaker/defibrillator.  Avoid carrying your cellular phone near your device. - When traveling through airports, show security personnel your identification card to avoid being screened in the metal detectors.  Ask the security personnel to use the hand wand. - Avoid arc welding equipment, MRI testing (magnetic resonance imaging), TENS units (transcutaneous nerve stimulators).  Call the office for questions about other devices. - Avoid electrical appliances that are in poor condition or are not properly grounded. - Microwave ovens are safe to be near or to operate.  Additional information for defibrillator patients should your device go off: - If your device goes off ONCE and you feel fine afterward, notify the device clinic nurses. - If your device goes off ONCE and you do not feel well afterward, call 911. - If your device goes  off TWICE, call 911. - If your device goes off THREE times in one day, call 911.  DO NOT DRIVE YOURSELF OR A FAMILY MEMBER WITH A DEFIBRILLATOR TO THE HOSPITAL--CALL 911.

## 2016-11-06 NOTE — Progress Notes (Signed)
Progress Note  Patient Name: Kristen Bradley Date of Encounter: 11/06/2016  Primary Cardiologist: Dr. Angelena Form  Subjective   Minimal to moderate incisional pain  Inpatient Medications    Scheduled Meds: . amLODipine  10 mg Oral Daily  . aspirin EC  81 mg Oral Daily  . cloNIDine  0.1 mg Oral BID  . furosemide  80 mg Oral BID  . Influenza vac split quadrivalent PF  0.5 mL Intramuscular Tomorrow-1000  . isosorbide mononitrate  30 mg Oral Daily  . metoprolol succinate  25 mg Oral Daily   Continuous Infusions:  PRN Meds: acetaminophen, nitroGLYCERIN, ondansetron (ZOFRAN) IV, traMADol   Vital Signs    Vitals:   11/05/16 1411 11/05/16 2209 11/06/16 0455 11/06/16 0912  BP: (!) 175/83 (!) 154/70 (!) 150/73 (!) 158/70  Pulse: 72 62 (!) 59 63  Resp: 11 17  14   Temp: 98.1 F (36.7 C) 98.5 F (36.9 C) 97.8 F (36.6 C) 98.1 F (36.7 C)  TempSrc: Oral Oral Oral Oral  SpO2: 95% 98% 98% 98%  Weight:      Height:        Intake/Output Summary (Last 24 hours) at 11/06/16 0928 Last data filed at 11/05/16 1700  Gross per 24 hour  Intake              360 ml  Output             1000 ml  Net             -640 ml   Filed Weights   11/05/16 0848  Weight: 138 lb (62.6 kg)    Telemetry    Normal sinus rhythm with ventricular pacing - Personally Reviewed  ECG    Normal sinus rhythm with His bundle pacing - Personally Reviewed  Physical Exam   GEN: No acute distress.   Neck:  6 cm JVD Cardiac: RRR, no murmurs, rubs, or gallops.  Respiratory: Clear to auscultation bilaterally. No hematoma at ICD insertion site GI: Soft, nontender, non-distended  MS: No edema; No deformity. Neuro:  Nonfocal  Psych: Normal affect   Labs    ChemistryNo results for input(s): NA, K, CL, CO2, GLUCOSE, BUN, CREATININE, CALCIUM, PROT, ALBUMIN, AST, ALT, ALKPHOS, BILITOT, GFRNONAA, GFRAA, ANIONGAP in the last 168 hours.   HematologyNo results for input(s): WBC, RBC, HGB, HCT, MCV, MCH, MCHC,  RDW, PLT in the last 168 hours.  Cardiac EnzymesNo results for input(s): TROPONINI in the last 168 hours. No results for input(s): TROPIPOC in the last 168 hours.   BNPNo results for input(s): BNP, PROBNP in the last 168 hours.   DDimer No results for input(s): DDIMER in the last 168 hours.   Radiology    Dg Chest 2 View  Result Date: 11/06/2016 CLINICAL DATA:  AICD placement. EXAM: CHEST  2 VIEW COMPARISON:  Chest x-ray dated March 04, 2016. FINDINGS: Interval placement of a left chest wall AICD. Leads are intact. Stable cardiomegaly. Normal pulmonary vascularity. Atherosclerotic calcification of the aortic arch. No focal consolidation, pleural effusion, or pneumothorax. No acute osseous abnormality. IMPRESSION: Left chest wall AICD placement.  No active cardiopulmonary disease. Electronically Signed   By: Titus Dubin M.D.   On: 11/06/2016 08:28    Cardiac Studies   None  Patient Profile     71 y.o. female with an ischemic cardiomyopathy, chronic systolic heart failure, ejection fraction 25%, left bundle branch block, admitted for biventricular ICD insertion  Assessment & Plan    1. ICD -  her Medtronic biventricular ICD with his bundle pacing is working nicely. Her QRS is very narrow. Her device is been interrogated under my direct supervision and is working normally. 2. Chronic systolic heart failure - the patient cannot take ACE inhibitor or ARB drugs. She is otherwise on guideline directed medical therapy, and I've encouraged her to take her medications and maintain a low-sodium diet. 3. Coronary artery disease/atherosclerotic vascular disease - she has extensive calcification of her aorta and coronary arteries. She stopped smoking approximately 8 months ago. She is encouraged to take her medications as prescribed and not return to smoking.  Cristopher Peru, M.D.  For questions or updates, please contact [place] Please consult [email or web address] for contact info under  Cardiology/STEMI.      Signed, Cristopher Peru, MD  11/06/2016, 9:28 AM  Patient ID: Kristen Bradley, female   DOB: Jun 28, 1945, 71 y.o.   MRN: [id number]

## 2016-11-08 ENCOUNTER — Encounter (HOSPITAL_COMMUNITY): Payer: Self-pay | Admitting: Internal Medicine

## 2016-11-15 ENCOUNTER — Ambulatory Visit (INDEPENDENT_AMBULATORY_CARE_PROVIDER_SITE_OTHER): Payer: Medicare HMO | Admitting: *Deleted

## 2016-11-15 DIAGNOSIS — I255 Ischemic cardiomyopathy: Secondary | ICD-10-CM

## 2016-11-15 DIAGNOSIS — I5022 Chronic systolic (congestive) heart failure: Secondary | ICD-10-CM

## 2016-11-15 LAB — CUP PACEART INCLINIC DEVICE CHECK
Brady Statistic AP VP Percent: 27.7 %
Brady Statistic AS VP Percent: 71.89 %
Brady Statistic RA Percent Paced: 27.45 %
Brady Statistic RV Percent Paced: 98.54 %
Date Time Interrogation Session: 20181015145717
HighPow Impedance: 61 Ohm
Implantable Lead Location: 753859
Implantable Lead Location: 753860
Implantable Lead Model: 3830
Implantable Lead Model: 6935
Lead Channel Impedance Value: 304 Ohm
Lead Channel Sensing Intrinsic Amplitude: 14.5 mV
Lead Channel Sensing Intrinsic Amplitude: 19.5 mV
Lead Channel Setting Pacing Amplitude: 0.5 V
Lead Channel Setting Pacing Amplitude: 5 V
Lead Channel Setting Pacing Pulse Width: 1 ms
MDC IDC LEAD IMPLANT DT: 20181005
MDC IDC LEAD IMPLANT DT: 20181005
MDC IDC LEAD IMPLANT DT: 20181005
MDC IDC LEAD LOCATION: 753860
MDC IDC MSMT BATTERY REMAINING LONGEVITY: 34 mo
MDC IDC MSMT BATTERY VOLTAGE: 3.1 V
MDC IDC MSMT LEADCHNL LV IMPEDANCE VALUE: 285 Ohm
MDC IDC MSMT LEADCHNL LV IMPEDANCE VALUE: 304 Ohm
MDC IDC MSMT LEADCHNL LV IMPEDANCE VALUE: 475 Ohm
MDC IDC MSMT LEADCHNL RA IMPEDANCE VALUE: 475 Ohm
MDC IDC MSMT LEADCHNL RA PACING THRESHOLD AMPLITUDE: 0.375 V
MDC IDC MSMT LEADCHNL RA PACING THRESHOLD PULSEWIDTH: 0.4 ms
MDC IDC MSMT LEADCHNL RA SENSING INTR AMPL: 1.625 mV
MDC IDC MSMT LEADCHNL RA SENSING INTR AMPL: 4.25 mV
MDC IDC MSMT LEADCHNL RV IMPEDANCE VALUE: 361 Ohm
MDC IDC PG IMPLANT DT: 20181005
MDC IDC SET LEADCHNL RA PACING AMPLITUDE: 3.5 V
MDC IDC SET LEADCHNL RV PACING PULSEWIDTH: 0.03 ms
MDC IDC SET LEADCHNL RV SENSING SENSITIVITY: 0.3 mV
MDC IDC STAT BRADY AP VS PERCENT: 0.03 %
MDC IDC STAT BRADY AS VS PERCENT: 0.38 %

## 2016-11-15 NOTE — Progress Notes (Signed)
Wound check appointment. Steri-strips removed. Wound without redness or edema. Incision edges approximated, wound well healed. Normal device function. Thresholds, sensing, and impedances consistent with implant measurements. Device programmed at 3.5V for extra safety margin until 3 month visit. RV apical lead programmed sub-threshold. HIS lead in LV port. Histogram distribution appropriate for patient and level of activity. No mode switches or ventricular arrhythmias noted. Patient educated about wound care, arm mobility, lifting restrictions, shock plan. ROV with AS 01/05/17 and GT 02/15/17.

## 2016-11-26 ENCOUNTER — Telehealth: Payer: Self-pay | Admitting: Internal Medicine

## 2016-11-26 NOTE — Telephone Encounter (Signed)
°  New Prob  Pt has some questions regarding upcoming dental work she is preparing for. Please call.

## 2016-11-26 NOTE — Telephone Encounter (Signed)
Per device clinic, Pt does not have to do anything special prior to her dental appt.  Notified Pt that she was fine to have dental work, does not need to do anything out of the ordinary.  Pt indicates understanding.

## 2016-12-10 ENCOUNTER — Ambulatory Visit: Payer: Medicare HMO | Admitting: Cardiovascular Disease

## 2016-12-10 ENCOUNTER — Encounter: Payer: Self-pay | Admitting: Cardiovascular Disease

## 2016-12-10 VITALS — BP 132/80 | HR 74 | Ht 65.0 in | Wt 149.8 lb

## 2016-12-10 DIAGNOSIS — I5022 Chronic systolic (congestive) heart failure: Secondary | ICD-10-CM

## 2016-12-10 DIAGNOSIS — F17201 Nicotine dependence, unspecified, in remission: Secondary | ICD-10-CM

## 2016-12-10 DIAGNOSIS — I255 Ischemic cardiomyopathy: Secondary | ICD-10-CM | POA: Diagnosis not present

## 2016-12-10 DIAGNOSIS — I70213 Atherosclerosis of native arteries of extremities with intermittent claudication, bilateral legs: Secondary | ICD-10-CM

## 2016-12-10 DIAGNOSIS — I2510 Atherosclerotic heart disease of native coronary artery without angina pectoris: Secondary | ICD-10-CM

## 2016-12-10 MED ORDER — ATORVASTATIN CALCIUM 10 MG PO TABS: 10.0000 mg | ORAL_TABLET | Freq: Every day | ORAL | 3 refills | Status: DC

## 2016-12-10 NOTE — Patient Instructions (Addendum)
Medication Instructions:  Your physician has recommended you make the following change in your medication:  Start atorvastatin 10 mg by mouth daily in the evening   Labwork: Your physician recommends that you return for lab work in: 12 weeks.  (lipid and liver profiles).  This will be fasting. The lab opens at 7:30 AM.  Scheduled for February 4,2019   Testing/Procedures: none  Follow-Up: Your physician recommends that you schedule a follow-up appointment in: 6 months. Please call our office in about 3 months to schedule this appointment    Any Other Special Instructions Will Be Listed Below (If Applicable).     If you need a refill on your cardiac medications before your next appointment, please call your pharmacy.

## 2016-12-10 NOTE — Progress Notes (Signed)
Chief Complaint  Patient presents with  . Coronary Artery Disease    History of Present Illness: 71 yo female with history of CAD, ischemic cardiomyopathy s/p ICD, systolic CHF, tobacco abuse, bilateral carotid artery stenosis, renal artery stenosis s/p stenting right renal artery, PAD,  hepatitis C, HTN, HLD, DM who is here today for cardiac follow up. I met her in October 2013 for pre-operative evaluation prior to planned carotid endarterectomy with Dr. [name] with [facility]. She had a stress test December 2012 in [facility] that was reported as normal. I arranged an echo in November 2013 which showed normal LV and RV size and function with no significant valvular issues. Her carotid artery disease, renal artery stenosis and LE PAD is followed by Dr. [name] in the [facility] office. She was admitted to [facility] December 2017 with volume overload, dyspnea. Cardiac cath 02/03/16 with severe 3 vessel CAD with severe calcification of all vessels. Echo with LVEF=25%, mild MR. She was not felt to be a candidate for CABG or PCI. She was readmitted February 2018 with acute CHF exacerbation. She was diuresed and had improvement in symptoms.  Given her LV systolic dysfunction and no plans for revascularization she was referred to EP and had an ICD placed by Dr. Lovena Le on 11/05/16. Her ARB was stopped in Nephrology office. Statin was stopped due to elevated LFTs on Lipitor 80 mg daily.   She is here today for follow up. The patient denies any chest pain, dyspnea, palpitations, lower extremity edema, orthopnea, PND, dizziness, near syncope or syncope. She is feeling great.     Primary Care Physician: [name], MD Vascular Surgery: [name]  Past Medical History:  Diagnosis Date  . Adrenal tumor 08/2007   surgery  . Cancer of right breast [facility])    s/p lumpectomy, XRT  . Carotid artery disease (HCC)    s/p bilateral CEA  . Hepatitis C    "got treatment for it" (01/29/2016)  . HFrEF (heart failure with reduced  ejection fraction) ([place])   . High cholesterol    takes Lopid daily  . History of UTI    takes Diflucan dail--pt states no uti in a couple of yrs though  . Hypertension    takes Losartan,Amlodipine,HCTZ,and Atenolol daily and Clatarpres  . Ischemic cardiomyopathy    a. 11/2016 s/p MDT BiV ICD w/ his bundle pacing.  . Joint pain    legs  . Renal artery stenosis (HCC)    Right renal artery stent 05/31/06  . Stroke [facility]) Jan. 7, 2014  . Tobacco abuse   . Type II diabetes mellitus ([place])     Past Surgical History:  Procedure Laterality Date  . ABDOMINAL HYSTERECTOMY    . ADRENAL GLAND SURGERY  2009  . BREAST LUMPECTOMY Right 2009  . LUMBAR DISC SURGERY    . RENAL ARTERY STENT Right 05/2006    Current Outpatient Medications  Medication Sig Dispense Refill  . amLODipine (NORVASC) 10 MG tablet Take 10 mg by mouth daily.     Marland Kitchen aspirin EC 81 MG tablet Take 81 mg by mouth daily.      . cloNIDine (CATAPRES) 0.1 MG tablet Take 0.1 mg by mouth 2 (two) times daily.      . furosemide (LASIX) 80 MG tablet Take 1 tablet (80 mg total) by mouth 2 (two) times daily. 180 tablet 2  . isosorbide mononitrate (IMDUR) 30 MG 24 hr tablet Take 1 tablet (30 mg total) by mouth daily. 60 tablet 10  .  metFORMIN (GLUCOPHAGE-XR) 500 MG 24 hr tablet Take 500 mg by mouth daily.    . metoprolol succinate (TOPROL-XL) 25 MG 24 hr tablet Take 1 tablet (25 mg total) by mouth daily. 60 tablet 10  . nitroGLYCERIN (NITROSTAT) 0.4 MG SL tablet Place 1 tablet (0.4 mg total) under the tongue every 5 (five) minutes as needed for chest pain. 25 tablet 3  . traMADol (ULTRAM) 50 MG tablet Take 1 tablet by mouth as directed.    Marland Kitchen atorvastatin (LIPITOR) 10 MG tablet Take 1 tablet (10 mg total) daily by mouth. 90 tablet 3   No current facility-administered medications for this visit.     Allergies  Allergen Reactions  . Chlorhexidine Gluconate Itching  . Ace Inhibitors Cough  . Dilaudid [Hydromorphone Hcl] Itching     Social History   Socioeconomic History  . Marital status: Divorced    Spouse name: Not on file  . Number of children: 1  . Years of education: Not on file  . Highest education level: Not on file  Social Needs  . Financial resource strain: Not on file  . Food insecurity - worry: Not on file  . Food insecurity - inability: Not on file  . Transportation needs - medical: Not on file  . Transportation needs - non-medical: Not on file  Occupational History  . Occupation: Retired-truck Education administrator: RETIRED  Tobacco Use  . Smoking status: Former Smoker    Years: 53.00    Types: Cigarettes    Last attempt to quit: 03/2016    Years since quitting: 0.7  . Smokeless tobacco: Never Used  Substance and Sexual Activity  . Alcohol use: No    Alcohol/week: 0.0 oz  . Drug use: No  . Sexual activity: Not on file  Other Topics Concern  . Not on file  Social History Narrative  . Not on file    Family History  Problem Relation Age of Onset  . Cancer Mother        ? type  . Cancer Father        ? type  . Diabetes Sister   . Hypertension Sister   . Diabetes Brother   . Hypertension Brother   . CAD Neg Hx     Review of Systems:  As stated in the HPI and otherwise negative.   BP 132/80   Pulse 74   Ht _0  (1.651 m)   Wt 149 lb 12.8 oz (67.9 kg)   SpO2 98%   BMI 24.93 kg/m   Physical Examination:  General: Well developed, well nourished, NAD  HEENT: OP clear, mucus membranes moist  SKIN: warm, dry. No rashes. Neuro: No focal deficits  Musculoskeletal: Muscle strength 5/5 all ext  Psychiatric: Mood and affect normal  Neck: No JVD, no carotid bruits, no thyromegaly, no lymphadenopathy.  Lungs:Clear bilaterally, no wheezes, rhonci, crackles Cardiovascular: Regular rate and rhythm. Soft systolic murmur. No gallops or rubs. Abdomen:Soft. Bowel sounds present. Non-tender.  Extremities: No lower extremity edema. Pulses are 2 + in the bilateral DP/PT.  Echo  01/30/16: Left ventricle: The cavity size was normal. Wall thickness was   increased in a pattern of mild LVH. The estimated ejection   fraction was 25%. Diffuse severe hypokinesis with septal-lateral   dyssynchrony. Doppler parameters are consistent with restrictive   physiology, indicative of decreased left ventricular diastolic   compliance and/or increased left atrial pressure. - Aortic valve: There was no stenosis. - Mitral valve: There was  mild to moderate regurgitation. - Left atrium: The atrium was moderately dilated. - Right ventricle: The cavity size was normal. Systolic function   was mildly reduced. - Tricuspid valve: Peak RV-RA gradient (S): 44 mm Hg. - Pulmonary arteries: PA peak pressure: 59 mm Hg (S). - Systemic veins: IVC measured 2.1 cm with < 50% respirophasic   variation, suggesting RA pressure 15 mmHg. - Pericardium, extracardiac: A trivial pericardial effusion was   identified posterior to the heart.  Impressions:  - Normal LV size with mild LV hypertrophy. EF 25% with severe   diffuse hypokinesis and septal-lateral dyssynchrony. Restrictive   diastolic function. Normal RV size with mildly decreased systolic   function. Moderate pulmonary hypertension. Mild to moderate MR.   Dilated IVC suggestive of elevated RV filling pressure.  Cardiac cath 02/03/16:  Ost RCA to Prox RCA lesion, 60 %stenosed.  Mid RCA lesion, 80 %stenosed.  Dist RCA lesion, 60 %stenosed.  RPDA lesion, 99 %stenosed.  Ost LM lesion, 50 %stenosed.  Ost Cx to Prox Cx lesion, 70 %stenosed.  Ost 3rd Mrg to 3rd Mrg lesion, 99 %stenosed.  2nd Mrg lesion, 50 %stenosed.  Ost 2nd Mrg to 2nd Mrg lesion, 20 %stenosed.  Prox Cx to Mid Cx lesion, 20 %stenosed.  Mid LAD lesion, 90 %stenosed.  1st Diag lesion, 90 %stenosed.  Dist LAD lesion, 80 %stenosed.  LV end diastolic pressure is normal.  Mid LAD to Dist LAD lesion, 20 %stenosed.  Ost LAD to Prox LAD lesion, 20 %stenosed.   1.  Severe calcific three vessel CAD 2. Normal filling pressures 3. Ischemic cardiomyopathy (LV function reduced by echo)  Recommendations: She has severe calcification in all three vessels. Her anatomy is not favorable for PCI. I will ask CT surgery to see her to discuss CABG.  Diagnostic Diagram        EKG:  EKG is not  ordered today. The ekg ordered today demonstrates   Recent Labs: 01/29/2016: TSH 0.978 03/03/2016: B Natriuretic Peptide 3,214.8 05/18/2016: ALT 13 10/26/2016: BUN 39; Creatinine, Ser 1.77; Hemoglobin 10.9; Platelets 117; Potassium 4.4; Sodium 141   Lipid Panel    Component Value Date/Time   CHOL 129 04/12/2016 0839   TRIG 134 04/12/2016 0839   HDL 40 04/12/2016 0839   CHOLHDL 3.2 04/12/2016 0839   LDLCALC 62 04/12/2016 0839     Wt Readings from Last 3 Encounters:  12/10/16 149 lb 12.8 oz (67.9 kg)  11/05/16 138 lb (62.6 kg)  10/29/16 142 lb 8 oz (64.6 kg)     Other studies Reviewed: Additional studies/ records that were reviewed today include: . Review of the above records demonstrates:   Assessment and Plan:   1. CAD without angina: She has no chest pain suggestive of angina. She has severe, diffuse calcific CAD and is not a candidate for CABG. Her anatomy is not favorable for PCI. Her calcified aorta is a contraindication for CABG.  I will continue medical management of her CAD with ASA, Plavix, Imdur, beta blocker and Norvasc. Her statin was held due to elevated LFTs that resolved off of the high dose statin (Lipitor 80 mg daily). I will start Lipitor 10 mg dailyi and check Lipids and LFTs in 12 weeks.     2. Ischemic cardiomyopathy: LVEF=25-30% by echo in September 2018. She does not have anatomy that is favorable for CABG or PCI. She is now s/p ICD placement in October 2018. She is doing well and remains NYHA class 1. She is on Toprol. She  had been on Cozaar but this was stopped in the Nephrology office. I do not think Delene Loll is an option right now  either. Can consider addition of aldactone.     3. Chronic systolic CHF:  Her weight is up today but she has no LE edema, dyspnea or abdominal swelling. She has not been watching her diet. She appears to be euvolemic. Will continue Lasix 80 mg po BID. I have asked her to follow daily weights, watch her diet and to call if she has any signs of CHF.   4. Tobacco abuse, in remission: She stopped smoking.   5. PAD:She has LE disease, renal artery stenosis and bilateral carotid artery disease. This is followed in the Vascular surgery office.   Current medicines are reviewed at length with the patient today.  The patient does not have concerns regarding medicines.  The following changes have been made:  no change  Labs/ tests ordered today include:   Orders Placed This Encounter  Procedures  . Lipid Profile  . Hepatic function panel   Disposition:   FU with me in 6 months  Signed, [name], MD 12/10/2016 3:28 PM    [facility] [address], [place], [place]  [address] Phone: [phone]; Fax: [phone]

## 2016-12-15 DIAGNOSIS — I701 Atherosclerosis of renal artery: Secondary | ICD-10-CM | POA: Diagnosis not present

## 2016-12-15 DIAGNOSIS — N183 Chronic kidney disease, stage 3 (moderate): Secondary | ICD-10-CM | POA: Diagnosis not present

## 2016-12-15 DIAGNOSIS — I129 Hypertensive chronic kidney disease with stage 1 through stage 4 chronic kidney disease, or unspecified chronic kidney disease: Secondary | ICD-10-CM | POA: Diagnosis not present

## 2016-12-15 DIAGNOSIS — N2581 Secondary hyperparathyroidism of renal origin: Secondary | ICD-10-CM | POA: Diagnosis not present

## 2016-12-15 DIAGNOSIS — D631 Anemia in chronic kidney disease: Secondary | ICD-10-CM | POA: Diagnosis not present

## 2016-12-27 DIAGNOSIS — I129 Hypertensive chronic kidney disease with stage 1 through stage 4 chronic kidney disease, or unspecified chronic kidney disease: Secondary | ICD-10-CM | POA: Diagnosis not present

## 2016-12-28 DIAGNOSIS — I6523 Occlusion and stenosis of bilateral carotid arteries: Secondary | ICD-10-CM | POA: Diagnosis not present

## 2016-12-28 DIAGNOSIS — N183 Chronic kidney disease, stage 3 (moderate): Secondary | ICD-10-CM | POA: Diagnosis not present

## 2016-12-28 DIAGNOSIS — I2510 Atherosclerotic heart disease of native coronary artery without angina pectoris: Secondary | ICD-10-CM | POA: Diagnosis not present

## 2016-12-28 DIAGNOSIS — E119 Type 2 diabetes mellitus without complications: Secondary | ICD-10-CM | POA: Diagnosis not present

## 2016-12-28 DIAGNOSIS — E785 Hyperlipidemia, unspecified: Secondary | ICD-10-CM | POA: Diagnosis not present

## 2016-12-28 DIAGNOSIS — M8588 Other specified disorders of bone density and structure, other site: Secondary | ICD-10-CM | POA: Diagnosis not present

## 2016-12-28 DIAGNOSIS — I10 Essential (primary) hypertension: Secondary | ICD-10-CM | POA: Diagnosis not present

## 2016-12-28 DIAGNOSIS — I7409 Other arterial embolism and thrombosis of abdominal aorta: Secondary | ICD-10-CM | POA: Diagnosis not present

## 2016-12-28 DIAGNOSIS — I5022 Chronic systolic (congestive) heart failure: Secondary | ICD-10-CM | POA: Diagnosis not present

## 2016-12-29 ENCOUNTER — Other Ambulatory Visit: Payer: Self-pay | Admitting: Nephrology

## 2016-12-29 DIAGNOSIS — N183 Chronic kidney disease, stage 3 unspecified: Secondary | ICD-10-CM

## 2016-12-29 NOTE — Addendum Note (Signed)
Addended by: Lianne Cure A on: 12/29/2016 12:54 PM   Modules accepted: Orders

## 2016-12-30 DIAGNOSIS — N183 Chronic kidney disease, stage 3 (moderate): Secondary | ICD-10-CM | POA: Diagnosis not present

## 2016-12-30 DIAGNOSIS — D631 Anemia in chronic kidney disease: Secondary | ICD-10-CM | POA: Diagnosis not present

## 2016-12-30 DIAGNOSIS — I701 Atherosclerosis of renal artery: Secondary | ICD-10-CM | POA: Diagnosis not present

## 2016-12-30 DIAGNOSIS — N2581 Secondary hyperparathyroidism of renal origin: Secondary | ICD-10-CM | POA: Diagnosis not present

## 2016-12-30 DIAGNOSIS — I129 Hypertensive chronic kidney disease with stage 1 through stage 4 chronic kidney disease, or unspecified chronic kidney disease: Secondary | ICD-10-CM | POA: Diagnosis not present

## 2017-01-04 ENCOUNTER — Ambulatory Visit
Admission: RE | Admit: 2017-01-04 | Discharge: 2017-01-04 | Disposition: A | Discharge status: Home / Self Care | Payer: Medicare HMO | Source: Ambulatory Visit | Attending: Nephrology | Admitting: Nephrology

## 2017-01-04 DIAGNOSIS — N184 Chronic kidney disease, stage 4 (severe): Secondary | ICD-10-CM | POA: Diagnosis not present

## 2017-01-04 DIAGNOSIS — N183 Chronic kidney disease, stage 3 unspecified: Secondary | ICD-10-CM

## 2017-01-05 ENCOUNTER — Encounter: Payer: Medicare HMO | Admitting: Nurse Practitioner

## 2017-01-05 DIAGNOSIS — H9191 Unspecified hearing loss, right ear: Secondary | ICD-10-CM | POA: Diagnosis not present

## 2017-01-05 DIAGNOSIS — H6123 Impacted cerumen, bilateral: Secondary | ICD-10-CM | POA: Diagnosis not present

## 2017-01-26 DIAGNOSIS — I255 Ischemic cardiomyopathy: Secondary | ICD-10-CM | POA: Insufficient documentation

## 2017-01-26 DIAGNOSIS — C50911 Malignant neoplasm of unspecified site of right female breast: Secondary | ICD-10-CM | POA: Insufficient documentation

## 2017-01-26 DIAGNOSIS — Z87440 Personal history of urinary (tract) infections: Secondary | ICD-10-CM | POA: Insufficient documentation

## 2017-01-26 DIAGNOSIS — B1920 Unspecified viral hepatitis C without hepatic coma: Secondary | ICD-10-CM | POA: Insufficient documentation

## 2017-01-26 DIAGNOSIS — I779 Disorder of arteries and arterioles, unspecified: Secondary | ICD-10-CM | POA: Insufficient documentation

## 2017-01-26 DIAGNOSIS — I5020 Unspecified systolic (congestive) heart failure: Secondary | ICD-10-CM | POA: Insufficient documentation

## 2017-01-26 DIAGNOSIS — I701 Atherosclerosis of renal artery: Secondary | ICD-10-CM | POA: Insufficient documentation

## 2017-01-26 DIAGNOSIS — E1159 Type 2 diabetes mellitus with other circulatory complications: Secondary | ICD-10-CM | POA: Insufficient documentation

## 2017-01-26 DIAGNOSIS — E7800 Pure hypercholesterolemia, unspecified: Secondary | ICD-10-CM | POA: Insufficient documentation

## 2017-01-26 DIAGNOSIS — M2550 Pain in unspecified joint: Secondary | ICD-10-CM | POA: Insufficient documentation

## 2017-01-26 DIAGNOSIS — Z720 Tobacco use: Secondary | ICD-10-CM | POA: Insufficient documentation

## 2017-01-26 DIAGNOSIS — I739 Peripheral vascular disease, unspecified: Secondary | ICD-10-CM

## 2017-02-15 ENCOUNTER — Ambulatory Visit: Payer: Medicare HMO | Admitting: Internal Medicine

## 2017-02-15 ENCOUNTER — Encounter: Payer: Self-pay | Admitting: Internal Medicine

## 2017-02-15 VITALS — BP 150/67 | HR 69 | Ht 65.0 in | Wt 156.8 lb

## 2017-02-15 DIAGNOSIS — I519 Heart disease, unspecified: Secondary | ICD-10-CM

## 2017-02-15 DIAGNOSIS — I5022 Chronic systolic (congestive) heart failure: Secondary | ICD-10-CM

## 2017-02-15 NOTE — Progress Notes (Signed)
HPI Kristen Bradley returns today for evaluation of her ICD. She is a pleasant 72 yo woman with a h/o ICM, s/p MI, CAD, LBBB, chronic systolic heart failure who underwent BiV ICD insertion with a His bundle lead placed back in October. She has done well in the interim. Her QRS narrowed nicely. She denies chest pain, sob, or edema. No ICD shock.  Allergies  Allergen Reactions  . Chlorhexidine Gluconate Itching  . Ace Inhibitors Cough  . Dilaudid [Hydromorphone Hcl] Itching     Current Outpatient Medications  Medication Sig Dispense Refill  . amLODipine (NORVASC) 10 MG tablet Take 10 mg by mouth daily.     Marland Kitchen aspirin EC 81 MG tablet Take 81 mg by mouth daily.      Marland Kitchen atorvastatin (LIPITOR) 10 MG tablet Take 1 tablet (10 mg total) daily by mouth. 90 tablet 3  . cloNIDine (CATAPRES) 0.1 MG tablet Take 0.1 mg by mouth 2 (two) times daily.      . furosemide (LASIX) 80 MG tablet Take 1 tablet (80 mg total) by mouth 2 (two) times daily. 180 tablet 2  . isosorbide mononitrate (IMDUR) 30 MG 24 hr tablet Take 1 tablet (30 mg total) by mouth daily. 60 tablet 10  . metFORMIN (GLUCOPHAGE-XR) 500 MG 24 hr tablet Take 500 mg by mouth daily.    . metoprolol succinate (TOPROL-XL) 25 MG 24 hr tablet Take 1 tablet (25 mg total) by mouth daily. 60 tablet 10  . nitroGLYCERIN (NITROSTAT) 0.4 MG SL tablet Place 1 tablet (0.4 mg total) under the tongue every 5 (five) minutes as needed for chest pain. 25 tablet 3  . traMADol (ULTRAM) 50 MG tablet Take 1 tablet by mouth as directed.     No current facility-administered medications for this visit.      Past Medical History:  Diagnosis Date  . Adrenal tumor 08/2007   surgery  . Cancer of right breast [facility])    s/p lumpectomy, XRT  . Carotid artery disease (HCC)    s/p bilateral CEA  . Hepatitis C    "got treatment for it" (01/29/2016)  . HFrEF (heart failure with reduced ejection fraction) ([place])   . High cholesterol    takes Lopid daily  . History of  UTI    takes Diflucan dail--pt states no uti in a couple of yrs though  . Hypertension    takes Losartan,Amlodipine,HCTZ,and Atenolol daily and Clatarpres  . Ischemic cardiomyopathy    a. 11/2016 s/p MDT BiV ICD w/ his bundle pacing.  . Joint pain    legs  . Renal artery stenosis (HCC)    Right renal artery stent 05/31/06  . Stroke [facility]) Jan. 7, 2014  . Tobacco abuse   . Type II diabetes mellitus (HCC)     ROS:   All systems reviewed and negative except as noted in the HPI.   Past Surgical History:  Procedure Laterality Date  . ABDOMINAL HYSTERECTOMY    . ADRENAL GLAND SURGERY  2009  . BIV ICD INSERTION CRT-D N/A 11/05/2016   Procedure: BIV ICD INSERTION CRT-D;  Surgeon: Evans Lance, MD;  Location: [place] [facility];  Service: Cardiovascular;  Laterality: N/A;  . BREAST LUMPECTOMY Right 2009  . CARDIAC CATHETERIZATION N/A 02/03/2016   Procedure: Right/Left Heart Cath and Coronary Angiography;  Surgeon: Burnell Blanks, MD;  Location: [place] [facility];  Service: Cardiovascular;  Laterality: N/A;  . ENDARTERECTOMY  01/05/2012   Procedure: ENDARTERECTOMY CAROTID;  Surgeon: Conrad Cavetown, MD;  Location: [facility] OR;  Service: Vascular;  Laterality: Right;  Right Carotid Artery Endarterectomy, right stump pressure, intraoperative ultrasound  . ENDARTERECTOMY  02/08/2012   Procedure: ENDARTERECTOMY CAROTID;  Surgeon: Conrad Mackville, MD;  Location: [place];  Service: Vascular;  Laterality: Left;  . LUMBAR DISC SURGERY    . PATCH ANGIOPLASTY  02/08/2012   Procedure: PATCH ANGIOPLASTY;  Surgeon: Conrad Patchogue, MD;  Location: [facility] OR;  Service: Vascular;  Laterality: Left;  Carotid artery patch angioplasty using Vascu-Guard bovine patch 1cm x 6cm.  . RENAL ARTERY STENT Right 05/2006     Family History  Problem Relation Age of Onset  . Cancer Mother        ? type  . Cancer Father        ? type  . Diabetes Sister   . Hypertension Sister   . Diabetes Brother   . Hypertension Brother   .  CAD Neg Hx      Social History   Socioeconomic History  . Marital status: Divorced    Spouse name: Not on file  . Number of children: 1  . Years of education: Not on file  . Highest education level: Not on file  Social Needs  . Financial resource strain: Not on file  . Food insecurity - worry: Not on file  . Food insecurity - inability: Not on file  . Transportation needs - medical: Not on file  . Transportation needs - non-medical: Not on file  Occupational History  . Occupation: Retired-truck Education administrator: RETIRED  Tobacco Use  . Smoking status: Former Smoker    Years: 53.00    Types: Cigarettes    Last attempt to quit: 03/2016    Years since quitting: 0.9  . Smokeless tobacco: Never Used  Substance and Sexual Activity  . Alcohol use: No    Alcohol/week: 0.0 oz  . Drug use: No  . Sexual activity: Not on file  Other Topics Concern  . Not on file  Social History Narrative  . Not on file     BP (!) 150/67   Pulse 69   Ht 5\' 5"  (1.651 m)   Wt 156 lb 12.8 oz (71.1 kg)   BMI 26.09 kg/m   Physical Exam:  Well appearing NAD HEENT: Unremarkable Neck:  No JVD, no thyromegally Lymphatics:  No adenopathy Back:  No CVA tenderness Lungs:  Clear HEART:  Regular rate rhythm, no murmurs, no rubs, no clicks Abd:  soft, positive bowel sounds, no organomegally, no rebound, no guarding Ext:  2 plus pulses, no edema, no cyanosis, no clubbing Skin:  No rashes no nodules Neuro:  CN II through XII intact, motor grossly intact  EKG - NSR with his bundle pacing  DEVICE  Normal device function.  See PaceArt for details.   Assess/Plan: 1. Chronic systolic heart failure - she appears to be stable. With his bundle pacing her QRS has narrowed nicely. I have asked her to repeat her 2D echo to see how her EF has improved. 2. CAD - she denies anginal symptoms. Will follow. 3. Carotid vascular disease - she is s/p CEA and is asymptomatic. 4. ICD - her medtronic BiV ICD is  working normally. We reprogrammed her his bundle output.  Mikle [place]worth.D.

## 2017-02-15 NOTE — Patient Instructions (Addendum)
Medication Instructions: Your physician recommends that you continue on your current medications as directed. Please refer to the Current Medication list given to you today.  Labwork: None ordered.  Testing/Procedures:  Your physician has requested that you have an echocardiogram. Echocardiography is a painless test that uses sound waves to create images of your heart. It provides your doctor with information about the size and shape of your heart and how well your heart's chambers and valves are working. This procedure takes approximately one hour. There are no restrictions for this procedure.  Please schedule for an echocardiogram.   Follow-Up: Your physician wants you to follow-up in: 9 months with Dr Lovena Le. You will receive a reminder letter in the mail two months in advance. If you don't receive a letter, please call our office to schedule the follow-up appointment.  Remote monitoring is used to monitor your ICD from home. This monitoring reduces the number of office visits required to check your device to one time per year. It allows [place] to keep an eye on the functioning of your device to ensure it is working properly. You are scheduled for a device check from home on 05/17/2017. You may send your transmission at any time that day. If you have a wireless device, the transmission will be sent automatically. After your physician reviews your transmission, you will receive a postcard with your next transmission date.   Any Other Special Instructions Will Be Listed Below (If Applicable).     If you need a refill on your cardiac medications before your next appointment, please call your pharmacy.

## 2017-02-16 DIAGNOSIS — D631 Anemia in chronic kidney disease: Secondary | ICD-10-CM | POA: Diagnosis not present

## 2017-02-16 DIAGNOSIS — N2581 Secondary hyperparathyroidism of renal origin: Secondary | ICD-10-CM | POA: Diagnosis not present

## 2017-02-16 DIAGNOSIS — I129 Hypertensive chronic kidney disease with stage 1 through stage 4 chronic kidney disease, or unspecified chronic kidney disease: Secondary | ICD-10-CM | POA: Diagnosis not present

## 2017-02-16 DIAGNOSIS — N183 Chronic kidney disease, stage 3 (moderate): Secondary | ICD-10-CM | POA: Diagnosis not present

## 2017-02-16 DIAGNOSIS — I701 Atherosclerosis of renal artery: Secondary | ICD-10-CM | POA: Diagnosis not present

## 2017-02-17 LAB — CUP PACEART INCLINIC DEVICE CHECK
Battery Remaining Longevity: 80 mo
Brady Statistic AP VS Percent: 0.04 %
Brady Statistic RA Percent Paced: 47.79 %
Brady Statistic RV Percent Paced: 98.74 %
Date Time Interrogation Session: 20190115215839
HighPow Impedance: 65 Ohm
Implantable Lead Implant Date: 20181005
Implantable Lead Implant Date: 20181005
Implantable Lead Location: 753859
Implantable Lead Location: 753860
Implantable Lead Model: 3830
Implantable Lead Model: 6935
Implantable Pulse Generator Implant Date: 20181005
Lead Channel Impedance Value: 342 Ohm
Lead Channel Impedance Value: 532 Ohm
Lead Channel Impedance Value: 589 Ohm
Lead Channel Pacing Threshold Pulse Width: 0.4 ms
Lead Channel Sensing Intrinsic Amplitude: 15.375 mV
Lead Channel Setting Pacing Amplitude: 0.5 V
Lead Channel Setting Pacing Pulse Width: 1 ms
MDC IDC LEAD IMPLANT DT: 20181005
MDC IDC LEAD LOCATION: 753860
MDC IDC MSMT BATTERY VOLTAGE: 2.96 V
MDC IDC MSMT LEADCHNL LV IMPEDANCE VALUE: 285 Ohm
MDC IDC MSMT LEADCHNL LV IMPEDANCE VALUE: 304 Ohm
MDC IDC MSMT LEADCHNL RA PACING THRESHOLD AMPLITUDE: 0.5 V
MDC IDC MSMT LEADCHNL RA SENSING INTR AMPL: 2.875 mV
MDC IDC MSMT LEADCHNL RA SENSING INTR AMPL: 4.25 mV
MDC IDC MSMT LEADCHNL RV IMPEDANCE VALUE: 361 Ohm
MDC IDC MSMT LEADCHNL RV SENSING INTR AMPL: 25 mV
MDC IDC SET LEADCHNL LV PACING AMPLITUDE: 2.5 V
MDC IDC SET LEADCHNL RA PACING AMPLITUDE: 2 V
MDC IDC SET LEADCHNL RV PACING PULSEWIDTH: 0.03 ms
MDC IDC SET LEADCHNL RV SENSING SENSITIVITY: 0.3 mV
MDC IDC STAT BRADY AP VP PERCENT: 48.2 %
MDC IDC STAT BRADY AS VP PERCENT: 51.46 %
MDC IDC STAT BRADY AS VS PERCENT: 0.3 %

## 2017-02-25 ENCOUNTER — Other Ambulatory Visit (HOSPITAL_COMMUNITY): Payer: Medicare HMO

## 2017-02-28 ENCOUNTER — Other Ambulatory Visit: Payer: Self-pay | Admitting: Physician Assistant

## 2017-02-28 DIAGNOSIS — Z8619 Personal history of other infectious and parasitic diseases: Secondary | ICD-10-CM

## 2017-02-28 DIAGNOSIS — R1084 Generalized abdominal pain: Secondary | ICD-10-CM

## 2017-02-28 DIAGNOSIS — R1900 Intra-abdominal and pelvic swelling, mass and lump, unspecified site: Secondary | ICD-10-CM | POA: Diagnosis not present

## 2017-02-28 DIAGNOSIS — D649 Anemia, unspecified: Secondary | ICD-10-CM | POA: Diagnosis not present

## 2017-02-28 DIAGNOSIS — D696 Thrombocytopenia, unspecified: Secondary | ICD-10-CM | POA: Diagnosis not present

## 2017-02-28 DIAGNOSIS — Z86010 Personal history of colonic polyps: Secondary | ICD-10-CM | POA: Diagnosis not present

## 2017-03-01 ENCOUNTER — Other Ambulatory Visit: Payer: Medicare HMO

## 2017-03-03 ENCOUNTER — Telehealth: Payer: Self-pay

## 2017-03-03 NOTE — Telephone Encounter (Signed)
   [facility] Pre-operative Risk Assessment    Request for surgical clearance:  1. What type of surgery is being performed? colonoscopy   2. When is this surgery scheduled? unknown   3. What type of clearance is required (medical clearance vs. Pharmacy clearance to hold med vs. Both)? Medical clearance  4. Are there any medications that need to be held prior to surgery and how long?none   5. Practice name and name of physician performing surgery? [facility]  6. What is your office phone and fax number? [phone]/  [phone]   7. Anesthesia type (None, local, MAC, general) ? MAC   Frederik Schmidt 03/03/2017, 4:45 PM  _________________________________________________________________   (provider comments below)

## 2017-03-04 ENCOUNTER — Ambulatory Visit
Admission: RE | Admit: 2017-03-04 | Discharge: 2017-03-04 | Disposition: A | Discharge status: Home / Self Care | Payer: Medicare HMO | Source: Ambulatory Visit | Attending: Physician Assistant | Admitting: Physician Assistant

## 2017-03-04 DIAGNOSIS — R1900 Intra-abdominal and pelvic swelling, mass and lump, unspecified site: Secondary | ICD-10-CM

## 2017-03-04 DIAGNOSIS — Z8619 Personal history of other infectious and parasitic diseases: Secondary | ICD-10-CM

## 2017-03-04 DIAGNOSIS — B182 Chronic viral hepatitis C: Secondary | ICD-10-CM | POA: Diagnosis not present

## 2017-03-04 DIAGNOSIS — R1084 Generalized abdominal pain: Secondary | ICD-10-CM

## 2017-03-04 NOTE — Telephone Encounter (Signed)
    Chart reviewed as part of pre-operative protocol coverage. Please provide recommendations regarding clearance. You have seen the patient on 02/15/17.   [place], [place]  03/04/2017, 3:54 PM

## 2017-03-04 NOTE — Telephone Encounter (Signed)
Follow up    Helene Kelp from [facility] is returning call. This is not for a medication hold this is for a cardiac clearance to see if the patient can have the procedure. The procedure is for a colonoscopy and/or endoscopy. As of now the procedure is not scheduled until March. Due to waiting for clearance.

## 2017-03-06 NOTE — Telephone Encounter (Signed)
She may proceed with colonoscopy. GT

## 2017-03-07 ENCOUNTER — Other Ambulatory Visit: Payer: Medicare HMO

## 2017-03-07 NOTE — Telephone Encounter (Signed)
   Chart reviewed as part of pre-operative protocol coverage. Pre-op clearance already addressed by colleagues below. Per Dr. Lovena Le, patient may proceed with colonoscopy.  Will route this bundled recommendation to requesting provider via Epic fax function. Please call with questions.  Charlie Pitter, PA-C 03/07/2017, 9:59 AM

## 2017-03-09 DIAGNOSIS — B182 Chronic viral hepatitis C: Secondary | ICD-10-CM | POA: Diagnosis not present

## 2017-03-14 ENCOUNTER — Other Ambulatory Visit: Payer: Medicare HMO | Admitting: *Deleted

## 2017-03-14 ENCOUNTER — Ambulatory Visit (HOSPITAL_COMMUNITY): Payer: Medicare HMO | Attending: Cardiology

## 2017-03-14 ENCOUNTER — Other Ambulatory Visit: Payer: Self-pay

## 2017-03-14 DIAGNOSIS — I5022 Chronic systolic (congestive) heart failure: Secondary | ICD-10-CM

## 2017-03-14 DIAGNOSIS — I519 Heart disease, unspecified: Secondary | ICD-10-CM

## 2017-03-14 DIAGNOSIS — Z8673 Personal history of transient ischemic attack (TIA), and cerebral infarction without residual deficits: Secondary | ICD-10-CM | POA: Insufficient documentation

## 2017-03-14 DIAGNOSIS — I071 Rheumatic tricuspid insufficiency: Secondary | ICD-10-CM | POA: Insufficient documentation

## 2017-03-14 DIAGNOSIS — I252 Old myocardial infarction: Secondary | ICD-10-CM | POA: Diagnosis not present

## 2017-03-14 DIAGNOSIS — Z950 Presence of cardiac pacemaker: Secondary | ICD-10-CM | POA: Insufficient documentation

## 2017-03-14 DIAGNOSIS — I2510 Atherosclerotic heart disease of native coronary artery without angina pectoris: Secondary | ICD-10-CM

## 2017-03-14 DIAGNOSIS — I110 Hypertensive heart disease with heart failure: Secondary | ICD-10-CM | POA: Insufficient documentation

## 2017-03-14 LAB — HEPATIC FUNCTION PANEL
ALT: 12 IU/L (ref 0–32)
AST: 18 IU/L (ref 0–40)
Albumin: 3.9 g/dL (ref 3.5–4.8)
Alkaline Phosphatase: 135 IU/L — ABNORMAL HIGH (ref 39–117)
BILIRUBIN TOTAL: 0.2 mg/dL (ref 0.0–1.2)
BILIRUBIN, DIRECT: 0.1 mg/dL (ref 0.00–0.40)
TOTAL PROTEIN: 6.7 g/dL (ref 6.0–8.5)

## 2017-03-14 LAB — LIPID PANEL
CHOL/HDL RATIO: 7.2 ratio — AB (ref 0.0–4.4)
Cholesterol, Total: 237 mg/dL — ABNORMAL HIGH (ref 100–199)
HDL: 33 mg/dL — AB (ref >39)
LDL Calculated: 160 mg/dL — ABNORMAL HIGH (ref 0–99)
TRIGLYCERIDES: 219 mg/dL — AB (ref 0–149)
VLDL Cholesterol Cal: 44 mg/dL — ABNORMAL HIGH (ref 5–40)

## 2017-03-16 DIAGNOSIS — E785 Hyperlipidemia, unspecified: Secondary | ICD-10-CM | POA: Diagnosis not present

## 2017-03-16 DIAGNOSIS — I10 Essential (primary) hypertension: Secondary | ICD-10-CM | POA: Diagnosis not present

## 2017-03-16 DIAGNOSIS — M8588 Other specified disorders of bone density and structure, other site: Secondary | ICD-10-CM | POA: Diagnosis not present

## 2017-03-16 DIAGNOSIS — I2510 Atherosclerotic heart disease of native coronary artery without angina pectoris: Secondary | ICD-10-CM | POA: Diagnosis not present

## 2017-03-16 DIAGNOSIS — I739 Peripheral vascular disease, unspecified: Secondary | ICD-10-CM | POA: Diagnosis not present

## 2017-03-16 DIAGNOSIS — I6523 Occlusion and stenosis of bilateral carotid arteries: Secondary | ICD-10-CM | POA: Diagnosis not present

## 2017-03-16 DIAGNOSIS — E119 Type 2 diabetes mellitus without complications: Secondary | ICD-10-CM | POA: Diagnosis not present

## 2017-03-16 DIAGNOSIS — B182 Chronic viral hepatitis C: Secondary | ICD-10-CM | POA: Diagnosis not present

## 2017-03-16 DIAGNOSIS — D509 Iron deficiency anemia, unspecified: Secondary | ICD-10-CM | POA: Diagnosis not present

## 2017-03-28 DIAGNOSIS — D509 Iron deficiency anemia, unspecified: Secondary | ICD-10-CM | POA: Diagnosis not present

## 2017-04-06 ENCOUNTER — Ambulatory Visit: Payer: Medicare HMO

## 2017-05-03 ENCOUNTER — Ambulatory Visit (INDEPENDENT_AMBULATORY_CARE_PROVIDER_SITE_OTHER): Payer: Medicare HMO | Admitting: Pharmacist

## 2017-05-03 DIAGNOSIS — E7800 Pure hypercholesterolemia, unspecified: Secondary | ICD-10-CM | POA: Diagnosis not present

## 2017-05-03 MED ORDER — FENOFIBRATE 48 MG PO TABS: 48.0000 mg | ORAL_TABLET | Freq: Every day | ORAL | 3 refills | Status: DC

## 2017-05-03 MED ORDER — ROSUVASTATIN CALCIUM 20 MG PO TABS: 20.0000 mg | ORAL_TABLET | Freq: Every day | ORAL | 3 refills | Status: DC

## 2017-05-03 NOTE — Progress Notes (Signed)
Patient ID: Kristen Bradley                 DOB: 1945-11-21                    MRN: [id number]     HPI: Kristen Bradley is a 72 y.o. female patient of Dr Lovena Le referred to lipid clinic by PCP. PMH is significant for ICM and CAD s/p MI, stroke, renal artery stenosis, tobacco abuse, T2DM, LBBB, chronic HFrEF who underwent BiV ICD insertion, HLD, and HTN.  Pt presents today in good spirits. She has some medication discrepancies on her medication list - she has not been taking atorvastatin or metformin, and is now taking glipizide. She has atorvastatin, simvastatin, and gemfibrozil listed under previous medications but she does not recall experiencing any side effects with these therapies. She believes her simvastatin was stopped due to renal dysfunction. CrCl is currently 51mL/min. She checks her blood sugar at home which ranges 130-200 in the morning. She does not eat large meals and prefers to snack. Does not drink alcohol.  Current Medications: none Intolerances: simvastatin 40mg  daily, atorvastatin 10mg  and 80mg  daily, gemfibrozil 600mg  BID Risk Factors: CAD s/p MI, stroke, tobacco abuse, DM, HTN, age LDL goal: 70mg /dL, non-HDL goal < 100mg /dL  Diet: Bananas, potato chips, cookies. Likes diet orange soda and regular orange juice   Exercise: Leg pain - has trouble walking  Family History: Sister and brother with DM and HTN.  Social History: Former smoker for 53 years, quit in 2018. Denies alcohol and illicit drug use.  Labs: 03/16/17: TC 251, TG 382, HDL 27, LDL 147, non-HDL 224 (no therapy)  Past Medical History:  Diagnosis Date  . Adrenal tumor 08/2007   surgery  . Cancer of right breast [facility])    s/p lumpectomy, XRT  . Carotid artery disease (HCC)    s/p bilateral CEA  . Hepatitis C    "got treatment for it" (01/29/2016)  . HFrEF (heart failure with reduced ejection fraction) ([employer])   . High cholesterol    takes Lopid daily  . History of UTI    takes Diflucan dail--pt states no uti  in a couple of yrs though  . Hypertension    takes Losartan,Amlodipine,HCTZ,and Atenolol daily and Clatarpres  . Ischemic cardiomyopathy    a. 11/2016 s/p MDT BiV ICD w/ his bundle pacing.  . Joint pain    legs  . Renal artery stenosis (HCC)    Right renal artery stent 05/31/06  . Stroke [facility]) Jan. 7, 2014  . Tobacco abuse   . Type II diabetes mellitus ([place])     Current Outpatient Medications on File Prior to Visit  Medication Sig Dispense Refill  . amLODipine (NORVASC) 10 MG tablet Take 10 mg by mouth daily.     Marland Kitchen aspirin EC 81 MG tablet Take 81 mg by mouth daily.      Marland Kitchen atorvastatin (LIPITOR) 10 MG tablet Take 1 tablet (10 mg total) daily by mouth. 90 tablet 3  . cloNIDine (CATAPRES) 0.1 MG tablet Take 0.1 mg by mouth 2 (two) times daily.      . furosemide (LASIX) 80 MG tablet Take 1 tablet (80 mg total) by mouth 2 (two) times daily. 180 tablet 2  . isosorbide mononitrate (IMDUR) 30 MG 24 hr tablet Take 1 tablet (30 mg total) by mouth daily. 60 tablet 10  . metFORMIN (GLUCOPHAGE-XR) 500 MG 24 hr tablet Take 500 mg by mouth daily.    Marland Kitchen  metoprolol succinate (TOPROL-XL) 25 MG 24 hr tablet Take 1 tablet (25 mg total) by mouth daily. 60 tablet 10  . nitroGLYCERIN (NITROSTAT) 0.4 MG SL tablet Place 1 tablet (0.4 mg total) under the tongue every 5 (five) minutes as needed for chest pain. 25 tablet 3  . traMADol (ULTRAM) 50 MG tablet Take 1 tablet by mouth as directed.     No current facility-administered medications on file prior to visit.     Allergies  Allergen Reactions  . Chlorhexidine Gluconate Itching  . Ace Inhibitors Cough  . Dilaudid [Hydromorphone Hcl] Itching    Assessment/Plan:  1. Hyperlipidemia - LDL 147 above goal < 70 due to hx of ASCVD, TG elevated at 382 above goal < 150. Pt has taken simvastatin, atorvastatin, and gemfibrozil in the past but does not recall any adverse effects. Will start rosuvastatin 20mg  daily and fenofibrate 48mg  daily (lower dose due to renal  dysfunction) to lower LDL and TG. Encouraged pt to look for low sugar orange juice. Recheck lipids and LFTs in 3 months.   Megan E. Supple, PharmD, CPP, [facility] [address] [place], [place] [address] Phone: [phone]; Fax: [phone] 05/03/2017 11:25 AM

## 2017-05-03 NOTE — Patient Instructions (Addendum)
It was nice to meet you today.  We will start Crestor (rosuvastatin) 20mg  once a day to help lower your LDL cholesterol. Your LDL goal is less than 70.  We will start fenofibrate 48mg  once a day to help lower your triglycerides. Your triglyceride goal is less than 150.  Look for low sugar orange juice or add some water to your orange juice - this will help to lower your blood sugar and your triglycerides.  Follow up for fasting lab work in 3 months. Come in any timer after 7:30am on Wednesday, July 3rd for fasting blood work.

## 2017-05-10 ENCOUNTER — Other Ambulatory Visit: Payer: Self-pay | Admitting: Internal Medicine

## 2017-05-10 DIAGNOSIS — R1084 Generalized abdominal pain: Secondary | ICD-10-CM | POA: Diagnosis not present

## 2017-05-10 DIAGNOSIS — D509 Iron deficiency anemia, unspecified: Secondary | ICD-10-CM | POA: Diagnosis not present

## 2017-05-10 DIAGNOSIS — R140 Abdominal distension (gaseous): Secondary | ICD-10-CM | POA: Diagnosis not present

## 2017-05-11 ENCOUNTER — Other Ambulatory Visit: Payer: Self-pay | Admitting: Gastroenterology

## 2017-05-11 DIAGNOSIS — R1084 Generalized abdominal pain: Secondary | ICD-10-CM

## 2017-05-12 ENCOUNTER — Telehealth: Payer: Self-pay | Admitting: Cardiovascular Disease

## 2017-05-12 DIAGNOSIS — K740 Hepatic fibrosis: Secondary | ICD-10-CM | POA: Diagnosis not present

## 2017-05-12 DIAGNOSIS — B182 Chronic viral hepatitis C: Secondary | ICD-10-CM | POA: Diagnosis not present

## 2017-05-12 NOTE — Telephone Encounter (Signed)
Pt reports gaining 5 lbs in the last week.  Inquired how she knew she gained 5lb without a weight log -- pt responds, "I haven't weighed, but know that I have gained weight". Reviewed with Raquel Sarna, device clinic.  Our clinic can address fluid levels through her ICD in place.  However, there has been no transmission since implant.  Serial number to home monitoring box is not in the system.   Pt reports serial number is: [id number] She understands someone from device clinic will call her tomorrow to advise her on sending a transmission so that our ICM clinic can assess Optivol level before making a decision on plan of care. Patient verbalized understanding and agreeable to plan.

## 2017-05-12 NOTE — Telephone Encounter (Signed)
New Message    Pt c/o swelling: STAT is pt has developed SOB within 24 hours  1) How much weight have you gained and in what time span? 5lbs in a week    2) If swelling, where is the swelling located? Thighs, feet and stomach   3) Are you currently taking a fluid pill? Yes, lasix   4) Are you currently SOB? no  5) Do you have a log of your daily weights (if so, list)? no  6) Have you gained 3 pounds in a day or 5 pounds in a week? 5lbs   7) Have you traveled recently? No

## 2017-05-13 NOTE — Telephone Encounter (Signed)
I spoke with pt and gave her information from Dr. McAlhany 

## 2017-05-13 NOTE — Telephone Encounter (Signed)
Device shows no fluid overload but if she thinks her weight is up, I would have her take an extra 40 mg Lasix every morning for 3 days, follow daily weights. Let us know Monday how she feels.   [name]

## 2017-05-13 NOTE — Telephone Encounter (Signed)
Monitor SN imported into [employer]. Assisted patient with manual transmission. Transmission received and reviewed, no episodes, thoracic impedance at baseline.

## 2017-05-17 ENCOUNTER — Ambulatory Visit (INDEPENDENT_AMBULATORY_CARE_PROVIDER_SITE_OTHER): Payer: Medicare HMO | Admitting: *Deleted

## 2017-05-17 DIAGNOSIS — I255 Ischemic cardiomyopathy: Secondary | ICD-10-CM | POA: Diagnosis not present

## 2017-05-17 DIAGNOSIS — I701 Atherosclerosis of renal artery: Secondary | ICD-10-CM | POA: Diagnosis not present

## 2017-05-17 DIAGNOSIS — D631 Anemia in chronic kidney disease: Secondary | ICD-10-CM | POA: Diagnosis not present

## 2017-05-17 DIAGNOSIS — N2581 Secondary hyperparathyroidism of renal origin: Secondary | ICD-10-CM | POA: Diagnosis not present

## 2017-05-17 DIAGNOSIS — I129 Hypertensive chronic kidney disease with stage 1 through stage 4 chronic kidney disease, or unspecified chronic kidney disease: Secondary | ICD-10-CM | POA: Diagnosis not present

## 2017-05-17 DIAGNOSIS — N183 Chronic kidney disease, stage 3 (moderate): Secondary | ICD-10-CM | POA: Diagnosis not present

## 2017-05-17 NOTE — Telephone Encounter (Signed)
I placed call to pt to see how she is feeling but there was no answer.

## 2017-05-18 ENCOUNTER — Ambulatory Visit
Admission: RE | Admit: 2017-05-18 | Discharge: 2017-05-18 | Disposition: A | Discharge status: Home / Self Care | Payer: Medicare HMO | Source: Ambulatory Visit | Attending: Internal Medicine | Admitting: Internal Medicine

## 2017-05-18 ENCOUNTER — Other Ambulatory Visit: Payer: Self-pay | Admitting: Gastroenterology

## 2017-05-18 DIAGNOSIS — R1084 Generalized abdominal pain: Secondary | ICD-10-CM

## 2017-05-18 DIAGNOSIS — K5730 Diverticulosis of large intestine without perforation or abscess without bleeding: Secondary | ICD-10-CM | POA: Diagnosis not present

## 2017-05-18 NOTE — Progress Notes (Signed)
Remote ICD transmission.   

## 2017-05-19 ENCOUNTER — Other Ambulatory Visit: Payer: Self-pay | Admitting: Gastroenterology

## 2017-05-19 ENCOUNTER — Encounter: Payer: Self-pay | Admitting: Cardiology

## 2017-05-19 DIAGNOSIS — R1084 Generalized abdominal pain: Secondary | ICD-10-CM

## 2017-05-19 LAB — CUP PACEART REMOTE DEVICE CHECK
Brady Statistic AP VS Percent: 0.02 %
Brady Statistic AS VP Percent: 64.79 %
Brady Statistic AS VS Percent: 0.39 %
Date Time Interrogation Session: 20190412132843
HighPow Impedance: 60 Ohm
Implantable Lead Location: 753860
Implantable Lead Model: 3830
Implantable Lead Model: 5076
Lead Channel Impedance Value: 304 Ohm
Lead Channel Impedance Value: 304 Ohm
Lead Channel Impedance Value: 418 Ohm
Lead Channel Pacing Threshold Amplitude: 0.625 V
Lead Channel Sensing Intrinsic Amplitude: 3.75 mV
Lead Channel Sensing Intrinsic Amplitude: 3.75 mV
Lead Channel Setting Pacing Pulse Width: 0.03 ms
Lead Channel Setting Sensing Sensitivity: 0.3 mV
MDC IDC LEAD IMPLANT DT: 20181005
MDC IDC LEAD IMPLANT DT: 20181005
MDC IDC LEAD IMPLANT DT: 20181005
MDC IDC LEAD LOCATION: 753859
MDC IDC LEAD LOCATION: 753860
MDC IDC MSMT BATTERY REMAINING LONGEVITY: 79 mo
MDC IDC MSMT BATTERY VOLTAGE: 2.99 V
MDC IDC MSMT LEADCHNL LV IMPEDANCE VALUE: 532 Ohm
MDC IDC MSMT LEADCHNL RA IMPEDANCE VALUE: 532 Ohm
MDC IDC MSMT LEADCHNL RA PACING THRESHOLD PULSEWIDTH: 0.4 ms
MDC IDC MSMT LEADCHNL RV IMPEDANCE VALUE: 342 Ohm
MDC IDC MSMT LEADCHNL RV SENSING INTR AMPL: 19.125 mV
MDC IDC MSMT LEADCHNL RV SENSING INTR AMPL: 19.125 mV
MDC IDC PG IMPLANT DT: 20181005
MDC IDC SET LEADCHNL LV PACING AMPLITUDE: 2.5 V
MDC IDC SET LEADCHNL LV PACING PULSEWIDTH: 1 ms
MDC IDC SET LEADCHNL RA PACING AMPLITUDE: 1.5 V
MDC IDC SET LEADCHNL RV PACING AMPLITUDE: 0.5 V
MDC IDC STAT BRADY AP VP PERCENT: 34.8 %
MDC IDC STAT BRADY RA PERCENT PACED: 34.39 %
MDC IDC STAT BRADY RV PERCENT PACED: 98.36 %

## 2017-05-22 ENCOUNTER — Other Ambulatory Visit: Payer: Self-pay | Admitting: Cardiovascular Disease

## 2017-05-23 NOTE — Telephone Encounter (Signed)
I spoke with pt who reports she is feeling good. Has not weighed for the last few days. States last weight was 153 lbs and this is stable for her.  She is back on normal lasix dose. I asked her to weigh daily and let us know if she gains 3 lbs over night or 5 lbs in a week.

## 2017-06-01 DIAGNOSIS — N183 Chronic kidney disease, stage 3 (moderate): Secondary | ICD-10-CM | POA: Diagnosis not present

## 2017-06-02 DIAGNOSIS — H25099 Other age-related incipient cataract, unspecified eye: Secondary | ICD-10-CM | POA: Diagnosis not present

## 2017-06-02 DIAGNOSIS — E119 Type 2 diabetes mellitus without complications: Secondary | ICD-10-CM | POA: Diagnosis not present

## 2017-06-02 DIAGNOSIS — H524 Presbyopia: Secondary | ICD-10-CM | POA: Diagnosis not present

## 2017-06-02 DIAGNOSIS — H18419 Arcus senilis, unspecified eye: Secondary | ICD-10-CM | POA: Diagnosis not present

## 2017-06-02 DIAGNOSIS — H11159 Pinguecula, unspecified eye: Secondary | ICD-10-CM | POA: Diagnosis not present

## 2017-06-02 DIAGNOSIS — H5203 Hypermetropia, bilateral: Secondary | ICD-10-CM | POA: Diagnosis not present

## 2017-06-02 DIAGNOSIS — H52222 Regular astigmatism, left eye: Secondary | ICD-10-CM | POA: Diagnosis not present

## 2017-06-02 DIAGNOSIS — Z7984 Long term (current) use of oral hypoglycemic drugs: Secondary | ICD-10-CM | POA: Diagnosis not present

## 2017-06-08 ENCOUNTER — Encounter: Payer: Self-pay | Admitting: Pulmonary Disease

## 2017-06-08 ENCOUNTER — Ambulatory Visit: Payer: Medicare HMO | Admitting: Pulmonary Disease

## 2017-06-08 DIAGNOSIS — R911 Solitary pulmonary nodule: Secondary | ICD-10-CM | POA: Diagnosis not present

## 2017-06-08 MED ORDER — ALPRAZOLAM 0.25 MG PO TABS: ORAL_TABLET | ORAL | 0 refills | Status: DC

## 2017-06-08 NOTE — Patient Instructions (Signed)
We will schedule you for pulmonary function test and PET scan for further evaluation of the lung nodule We will give you Xanax to be used before the PET scan for help with your anxiety I will see you back in clinic in 2 to 4 weeks for review of tests and plan for further steps.

## 2017-06-08 NOTE — Progress Notes (Signed)
Kristen Bradley    [id number]    1946-01-01  Primary Care Physician:[name], MD  Referring Physician: [name], MD [address], [place] [address]  Chief complaint: Consult for enlarging lung nodule  HPI: 72 year old smoker with history of breast cancer, coronary artery disease, ischemic heart myopathy, hepatitis C Noted to have an enlarging right lower lobe lung nodule.  She had been previously evaluated in 2015 at the pulmonary clinic by Dr. Gwenette Greet and surgical resection versus 68-month follow-up was discussed.  After long discussion 35-month CT was decided upon however the patient was lost to follow-up  She returns to clinic today after she had a CT abdomen pelvis done for abdominal pain which shows that the right lower lobe nodule is bigger, now measuring 5mm.  She denies any cough, sputum production, dyspnea, fevers.  She quit smoking in February.  She is not on any inhalers History noted for breast cancer status post lumpectomy and radiation in 2009 with no evidence of recurrence  Pets: No pets Occupation: Retired Administrator Exposures: No known exposures, no mold, hot tub Smoking history: 26-pack-year.  Quit in February 2019 Travel history: No significant travel  Outpatient Encounter Medications as of 06/08/2017  Medication Sig  . amLODipine (NORVASC) 10 MG tablet Take 10 mg by mouth daily.   Marland Kitchen aspirin EC 81 MG tablet Take 81 mg by mouth daily.    . cloNIDine (CATAPRES) 0.1 MG tablet Take 0.1 mg by mouth 2 (two) times daily.    . fenofibrate (TRICOR) 48 MG tablet Take 1 tablet (48 mg total) by mouth daily.  . furosemide (LASIX) 80 MG tablet Take 1 tablet (80 mg total) by mouth 2 (two) times daily.  Marland Kitchen glipiZIDE (GLUCOTROL) 10 MG tablet Take 10 mg by mouth daily before breakfast.  . isosorbide mononitrate (IMDUR) 30 MG 24 hr tablet TAKE 1 TABLET BY MOUTH ONCE DAILY  . metoprolol succinate (TOPROL-XL) 25 MG 24 hr tablet TAKE 1 TABLET BY MOUTH ONCE  DAILY  . nitroGLYCERIN (NITROSTAT) 0.4 MG SL tablet Place 1 tablet (0.4 mg total) under the tongue every 5 (five) minutes as needed for chest pain.  . rosuvastatin (CRESTOR) 20 MG tablet Take 1 tablet (20 mg total) by mouth daily.  . traMADol (ULTRAM) 50 MG tablet Take 1 tablet by mouth as directed.   No facility-administered encounter medications on file as of 06/08/2017.     Allergies as of 06/08/2017 - Review Complete 06/08/2017  Allergen Reaction Noted  . Chlorhexidine gluconate Itching 02/03/2012  . Ace inhibitors Cough 08/01/2012  . Dilaudid [hydromorphone hcl] Itching 08/01/2012    Past Medical History:  Diagnosis Date  . Adrenal tumor 08/2007   surgery  . Cancer of right breast [facility])    s/p lumpectomy, XRT  . Carotid artery disease (HCC)    s/p bilateral CEA  . Hepatitis C    "got treatment for it" (01/29/2016)  . HFrEF (heart failure with reduced ejection fraction) ([place])   . High cholesterol    takes Lopid daily  . History of UTI    takes Diflucan dail--pt states no uti in a couple of yrs though  . Hypertension    takes Losartan,Amlodipine,HCTZ,and Atenolol daily and Clatarpres  . Ischemic cardiomyopathy    a. 11/2016 s/p MDT BiV ICD w/ his bundle pacing.  . Joint pain    legs  . Renal artery stenosis (HCC)    Right renal artery stent  05/31/06  . Stroke [facility]) Jan. 7, 2014  . Tobacco abuse   . Type II diabetes mellitus ([place])     Past Surgical History:  Procedure Laterality Date  . ABDOMINAL HYSTERECTOMY    . ADRENAL GLAND SURGERY  2009  . BIV ICD INSERTION CRT-D N/A 11/05/2016   Procedure: BIV ICD INSERTION CRT-D;  Surgeon: Evans Lance, MD;  Location: [facility];  Service: Cardiovascular;  Laterality: N/A;  . BREAST LUMPECTOMY Right 2009  . CARDIAC CATHETERIZATION N/A 02/03/2016   Procedure: Right/Left Heart Cath and Coronary Angiography;  Surgeon: Burnell Blanks, MD;  Location: [place] [facility];  Service: Cardiovascular;  Laterality: N/A;    . ENDARTERECTOMY  01/05/2012   Procedure: ENDARTERECTOMY CAROTID;  Surgeon: Conrad Statesville, MD;  Location: [place] [facility];  Service: Vascular;  Laterality: Right;  Right Carotid Artery Endarterectomy, right stump pressure, intraoperative ultrasound  . ENDARTERECTOMY  02/08/2012   Procedure: ENDARTERECTOMY CAROTID;  Surgeon: Conrad Crawfordsville, MD;  Location: [place];  Service: Vascular;  Laterality: Left;  . LUMBAR DISC SURGERY    . PATCH ANGIOPLASTY  02/08/2012   Procedure: PATCH ANGIOPLASTY;  Surgeon: Conrad Tanaina, MD;  [facility];  Service: Vascular;  Laterality: Left;  Carotid artery patch angioplasty using Vascu-Guard bovine patch 1cm x 6cm.  . RENAL ARTERY STENT Right 05/2006    Family History  Problem Relation Age of Onset  . Cancer Mother        ? type  . Cancer Father        ? type  . Diabetes Sister   . Hypertension Sister   . Diabetes Brother   . Hypertension Brother   . CAD Neg Hx     Social History   Socioeconomic History  . Marital status: Divorced    Spouse name: Not on file  . Number of children: 1  . Years of education: Not on file  . Highest education level: Not on file  Occupational History  . Occupation: Retired-truck Education administrator: RETIRED  Social Needs  . Financial resource strain: Not on file  . Food insecurity:    Worry: Not on file    Inability: Not on file  . Transportation needs:    Medical: Not on file    Non-medical: Not on file  Tobacco Use  . Smoking status: Former Smoker    Packs/day: 0.50    Years: 53.00    Pack years: 26.50    Types: Cigarettes    Last attempt to quit: 03/2016    Years since quitting: 1.2  . Smokeless tobacco: Never Used  Substance and Sexual Activity  . Alcohol use: No    Alcohol/week: 0.0 oz  . Drug use: No  . Sexual activity: Not on file  Lifestyle  . Physical activity:    Days per week: Not on file    Minutes per session: Not on file  . Stress: Not on file  Relationships  . Social connections:    Talks on phone:  Not on file    Gets together: Not on file    Attends religious service: Not on file    Active member of club or organization: Not on file    Attends meetings of clubs or organizations: Not on file    Relationship status: Not on file  . Intimate partner violence:    Fear of current or ex partner: Not on file    Emotionally abused: Not on file  Physically abused: Not on file    Forced sexual activity: Not on file  Other Topics Concern  . Not on file  Social History Narrative  . Not on file    Review of systems: Review of Systems  Constitutional: Negative for fever and chills.  HENT: Negative.   Eyes: Negative for blurred vision.  Respiratory: as per HPI  Cardiovascular: Negative for chest pain and palpitations.  Gastrointestinal: Negative for vomiting, diarrhea, blood per rectum. Genitourinary: Negative for dysuria, urgency, frequency and hematuria.  Musculoskeletal: Negative for myalgias, back pain and joint pain.  Skin: Negative for itching and rash.  Neurological: Negative for dizziness, tremors, focal weakness, seizures and loss of consciousness.  Endo/Heme/Allergies: Negative for environmental allergies.  Psychiatric/Behavioral: Negative for depression, suicidal ideas and hallucinations.  All other systems reviewed and are negative.  Physical Exam: Blood pressure (!) 142/80, pulse 67, height 5\' 6"  (1.676 m), weight 154 lb 3.2 oz (69.9 kg), SpO2 99 %. Gen:      No acute distress HEENT:  EOMI, sclera anicteric Neck:     No masses; no thyromegaly Lungs:    Clear to auscultation bilaterally; normal respiratory effort CV:         Regular rate and rhythm; no murmurs Abd:      + bowel sounds; soft, non-tender; no palpable masses, no distension Ext:    No edema; adequate peripheral perfusion Skin:      Warm and dry; no rash Neuro: alert and oriented x 3 Psych: normal mood and affect  Data Reviewed: CT chest 12/31/2010-4.4 mm right lower lobe nodule CT chest 08/06/2013-8 mm  right lower lobe nodule CT abdomen pelvis 05/18/2017-14 mm right lower lobe nodule.  Multiple indeterminate renal lesions I have reviewed the images personally.  Assessment:  Evaluation for right lower lobe lung nodule This has been noted back in 2012 and she was told in 2015 that she may need a resection or close follow up.  However patient had been noncompliant with follow-up and states that she never heard of the lung nodule before.  I reiterated that this nodule is growing and we need to get this evaluated.  Suspicion is for lung cancer however given her history of breast cancer and renal lesions noted on CT abdomen we need to consider recurrent breast cancer or renal cell cancer Schedule her for a PET scan and pulmonary function tests. Based on the results she may need CT-guided biopsy or referral to cardiothoracic surgery for evaluation of resection.  However given her significant comorbidities including cardiac disease, renal failure she may not be a good candidate for surgery.  More then 1/2 the time of the 40 min visit was spent in counseling and/or coordination of care with the patient and family.  Plan/Recommendations: - PFTs, PET scan - Follow up in 2-4 weeks to discuss results  Marshell Garfinkel MD [facility] 06/08/2017, 9:21 AM  CC: [name], MD

## 2017-06-13 ENCOUNTER — Telehealth: Payer: Self-pay | Admitting: Pulmonary Disease

## 2017-06-13 NOTE — Telephone Encounter (Signed)
Instructions for pt regarding the PET Scan:  Please arrive 30 minutes prior to the scheduled appointment time. The exam will take a minimum of 1 1/2 hours. Please do not eat or drink 6 hours prior to exam. IF YOU ARE DIABETIC-please do not take your insulin the morning of you exam or within 8 hours of your appointment time.   Called pt stating the above information to her. Pt stated she has certain meds that she has to take at a specific time.  I stated the above information to pt more than once. Pt expressed understanding. Nothing further needed at this time.

## 2017-06-14 ENCOUNTER — Telehealth: Payer: Self-pay | Admitting: Pulmonary Disease

## 2017-06-14 ENCOUNTER — Encounter (HOSPITAL_COMMUNITY): Payer: Medicare HMO

## 2017-06-14 MED ORDER — ALPRAZOLAM 0.5 MG PO TABS: ORAL_TABLET | ORAL | 0 refills | Status: DC

## 2017-06-14 NOTE — Telephone Encounter (Signed)
Pt requesting another rx of Xanax and requesting a stronger dose- pt took Xanax as prescribed prior to PET scheduled today at 1:00, but because pt drank orange juice prior to the testing it had to be rescheduled to 06/16/17.  Pt states that she did not feel any effect from the Xanax.   Pt wishes to pick up rx from the office.  Dr Vaughan Browner please advise.  Thanks!

## 2017-06-14 NOTE — Telephone Encounter (Signed)
Prescribe xanax 0.5 mg to be taken 45 min before procedure.

## 2017-06-14 NOTE — Telephone Encounter (Signed)
Pt is aware that Rx for Xanax 0.5 #1 has been placed up front for pick up. Nothing further is needed.

## 2017-06-16 ENCOUNTER — Encounter (HOSPITAL_COMMUNITY)
Admission: RE | Admit: 2017-06-16 | Discharge: 2017-06-16 | Disposition: A | Discharge status: Home / Self Care | Payer: Medicare HMO | Source: Ambulatory Visit | Attending: Pulmonary Disease | Admitting: Pulmonary Disease

## 2017-06-16 DIAGNOSIS — R911 Solitary pulmonary nodule: Secondary | ICD-10-CM | POA: Diagnosis not present

## 2017-06-16 LAB — GLUCOSE, CAPILLARY: Glucose-Capillary: 121 mg/dL — ABNORMAL HIGH (ref 65–99)

## 2017-06-16 MED ORDER — FLUDEOXYGLUCOSE F - 18 (FDG) INJECTION
8.0000 | Freq: Once | INTRAVENOUS | Status: AC | PRN
  Administered 2017-06-16: 8 via INTRAVENOUS

## 2017-06-21 DIAGNOSIS — I10 Essential (primary) hypertension: Secondary | ICD-10-CM | POA: Diagnosis not present

## 2017-06-21 DIAGNOSIS — I6523 Occlusion and stenosis of bilateral carotid arteries: Secondary | ICD-10-CM | POA: Diagnosis not present

## 2017-06-21 DIAGNOSIS — N183 Chronic kidney disease, stage 3 (moderate): Secondary | ICD-10-CM | POA: Diagnosis not present

## 2017-06-21 DIAGNOSIS — B182 Chronic viral hepatitis C: Secondary | ICD-10-CM | POA: Diagnosis not present

## 2017-06-21 DIAGNOSIS — I739 Peripheral vascular disease, unspecified: Secondary | ICD-10-CM | POA: Diagnosis not present

## 2017-06-21 DIAGNOSIS — I2510 Atherosclerotic heart disease of native coronary artery without angina pectoris: Secondary | ICD-10-CM | POA: Diagnosis not present

## 2017-06-21 DIAGNOSIS — E785 Hyperlipidemia, unspecified: Secondary | ICD-10-CM | POA: Diagnosis not present

## 2017-06-21 DIAGNOSIS — E119 Type 2 diabetes mellitus without complications: Secondary | ICD-10-CM | POA: Diagnosis not present

## 2017-06-22 DIAGNOSIS — N183 Chronic kidney disease, stage 3 (moderate): Secondary | ICD-10-CM | POA: Diagnosis not present

## 2017-06-22 DIAGNOSIS — I739 Peripheral vascular disease, unspecified: Secondary | ICD-10-CM | POA: Diagnosis not present

## 2017-06-22 DIAGNOSIS — E1121 Type 2 diabetes mellitus with diabetic nephropathy: Secondary | ICD-10-CM | POA: Diagnosis not present

## 2017-06-22 DIAGNOSIS — I701 Atherosclerosis of renal artery: Secondary | ICD-10-CM | POA: Diagnosis not present

## 2017-06-22 DIAGNOSIS — E785 Hyperlipidemia, unspecified: Secondary | ICD-10-CM | POA: Diagnosis not present

## 2017-06-22 DIAGNOSIS — N2581 Secondary hyperparathyroidism of renal origin: Secondary | ICD-10-CM | POA: Diagnosis not present

## 2017-06-22 DIAGNOSIS — I129 Hypertensive chronic kidney disease with stage 1 through stage 4 chronic kidney disease, or unspecified chronic kidney disease: Secondary | ICD-10-CM | POA: Diagnosis not present

## 2017-06-22 DIAGNOSIS — N189 Chronic kidney disease, unspecified: Secondary | ICD-10-CM | POA: Diagnosis not present

## 2017-06-22 DIAGNOSIS — D631 Anemia in chronic kidney disease: Secondary | ICD-10-CM | POA: Diagnosis not present

## 2017-06-22 DIAGNOSIS — Z9889 Other specified postprocedural states: Secondary | ICD-10-CM | POA: Diagnosis not present

## 2017-06-23 ENCOUNTER — Ambulatory Visit (INDEPENDENT_AMBULATORY_CARE_PROVIDER_SITE_OTHER): Payer: Medicare HMO | Admitting: Pulmonary Disease

## 2017-06-23 DIAGNOSIS — R911 Solitary pulmonary nodule: Secondary | ICD-10-CM | POA: Diagnosis not present

## 2017-06-23 LAB — PULMONARY FUNCTION TEST
DL/VA % PRED: 60 %
DL/VA: 3.06 ml/min/mmHg/L
DLCO UNC % PRED: 45 %
DLCO unc: 12.19 ml/min/mmHg
FEF 25-75 POST: 1.98 L/s
FEF 25-75 PRE: 1.37 L/s
FEF2575-%CHANGE-POST: 44 %
FEF2575-%PRED-PRE: 75 %
FEF2575-%Pred-Post: 108 %
FEV1-%Change-Post: 9 %
FEV1-%PRED-POST: 98 %
FEV1-%Pred-Pre: 90 %
FEV1-PRE: 1.81 L
FEV1-Post: 1.98 L
FEV1FVC-%CHANGE-POST: 4 %
FEV1FVC-%Pred-Pre: 97 %
FEV6-%CHANGE-POST: 5 %
FEV6-%PRED-PRE: 95 %
FEV6-%Pred-Post: 101 %
FEV6-Post: 2.52 L
FEV6-Pre: 2.38 L
FEV6FVC-%Change-Post: 0 %
FEV6FVC-%Pred-Post: 102 %
FEV6FVC-%Pred-Pre: 102 %
FVC-%CHANGE-POST: 5 %
FVC-%PRED-POST: 97 %
FVC-%PRED-PRE: 93 %
FVC-POST: 2.53 L
FVC-PRE: 2.4 L
POST FEV6/FVC RATIO: 100 %
PRE FEV1/FVC RATIO: 75 %
Post FEV1/FVC ratio: 79 %
Pre FEV6/FVC Ratio: 99 %
RV % pred: 86 %
RV: 2 L
TLC % pred: 83 %
TLC: 4.47 L

## 2017-06-23 NOTE — Progress Notes (Signed)
PFT done today. 

## 2017-06-25 ENCOUNTER — Emergency Department (HOSPITAL_COMMUNITY): Payer: Medicare HMO

## 2017-06-25 ENCOUNTER — Encounter (HOSPITAL_COMMUNITY): Payer: Self-pay | Admitting: Emergency Medicine

## 2017-06-25 ENCOUNTER — Other Ambulatory Visit: Payer: Self-pay

## 2017-06-25 ENCOUNTER — Inpatient Hospital Stay (HOSPITAL_COMMUNITY)
Admission: EM | Admit: 2017-06-25 | Discharge: 2017-06-29 | DRG: 280 | Disposition: A | Discharge status: Home / Self Care | Payer: Medicare HMO | Attending: Internal Medicine | Admitting: Internal Medicine

## 2017-06-25 DIAGNOSIS — E1151 Type 2 diabetes mellitus with diabetic peripheral angiopathy without gangrene: Secondary | ICD-10-CM | POA: Diagnosis present

## 2017-06-25 DIAGNOSIS — D696 Thrombocytopenia, unspecified: Secondary | ICD-10-CM | POA: Diagnosis present

## 2017-06-25 DIAGNOSIS — J189 Pneumonia, unspecified organism: Secondary | ICD-10-CM | POA: Diagnosis present

## 2017-06-25 DIAGNOSIS — I714 Abdominal aortic aneurysm, without rupture: Secondary | ICD-10-CM | POA: Diagnosis present

## 2017-06-25 DIAGNOSIS — Z95810 Presence of automatic (implantable) cardiac defibrillator: Secondary | ICD-10-CM | POA: Diagnosis not present

## 2017-06-25 DIAGNOSIS — I25110 Atherosclerotic heart disease of native coronary artery with unstable angina pectoris: Secondary | ICD-10-CM | POA: Diagnosis present

## 2017-06-25 DIAGNOSIS — Z7984 Long term (current) use of oral hypoglycemic drugs: Secondary | ICD-10-CM

## 2017-06-25 DIAGNOSIS — Z853 Personal history of malignant neoplasm of breast: Secondary | ICD-10-CM

## 2017-06-25 DIAGNOSIS — Z8673 Personal history of transient ischemic attack (TIA), and cerebral infarction without residual deficits: Secondary | ICD-10-CM | POA: Diagnosis not present

## 2017-06-25 DIAGNOSIS — N184 Chronic kidney disease, stage 4 (severe): Secondary | ICD-10-CM | POA: Diagnosis present

## 2017-06-25 DIAGNOSIS — Z720 Tobacco use: Secondary | ICD-10-CM | POA: Diagnosis present

## 2017-06-25 DIAGNOSIS — Z66 Do not resuscitate: Secondary | ICD-10-CM | POA: Diagnosis present

## 2017-06-25 DIAGNOSIS — R7989 Other specified abnormal findings of blood chemistry: Secondary | ICD-10-CM | POA: Diagnosis present

## 2017-06-25 DIAGNOSIS — I214 Non-ST elevation (NSTEMI) myocardial infarction: Secondary | ICD-10-CM | POA: Diagnosis not present

## 2017-06-25 DIAGNOSIS — D649 Anemia, unspecified: Secondary | ICD-10-CM | POA: Diagnosis present

## 2017-06-25 DIAGNOSIS — I200 Unstable angina: Secondary | ICD-10-CM | POA: Diagnosis not present

## 2017-06-25 DIAGNOSIS — I129 Hypertensive chronic kidney disease with stage 1 through stage 4 chronic kidney disease, or unspecified chronic kidney disease: Secondary | ICD-10-CM | POA: Diagnosis not present

## 2017-06-25 DIAGNOSIS — Z79899 Other long term (current) drug therapy: Secondary | ICD-10-CM | POA: Diagnosis not present

## 2017-06-25 DIAGNOSIS — I213 ST elevation (STEMI) myocardial infarction of unspecified site: Secondary | ICD-10-CM | POA: Diagnosis not present

## 2017-06-25 DIAGNOSIS — E1122 Type 2 diabetes mellitus with diabetic chronic kidney disease: Secondary | ICD-10-CM | POA: Diagnosis not present

## 2017-06-25 DIAGNOSIS — I5021 Acute systolic (congestive) heart failure: Secondary | ICD-10-CM | POA: Diagnosis present

## 2017-06-25 DIAGNOSIS — I2583 Coronary atherosclerosis due to lipid rich plaque: Secondary | ICD-10-CM | POA: Diagnosis not present

## 2017-06-25 DIAGNOSIS — Z87891 Personal history of nicotine dependence: Secondary | ICD-10-CM | POA: Diagnosis not present

## 2017-06-25 DIAGNOSIS — R0789 Other chest pain: Secondary | ICD-10-CM | POA: Diagnosis not present

## 2017-06-25 DIAGNOSIS — Z7982 Long term (current) use of aspirin: Secondary | ICD-10-CM | POA: Diagnosis not present

## 2017-06-25 DIAGNOSIS — I779 Disorder of arteries and arterioles, unspecified: Secondary | ICD-10-CM | POA: Diagnosis present

## 2017-06-25 DIAGNOSIS — I420 Dilated cardiomyopathy: Secondary | ICD-10-CM | POA: Diagnosis not present

## 2017-06-25 DIAGNOSIS — I5043 Acute on chronic combined systolic (congestive) and diastolic (congestive) heart failure: Secondary | ICD-10-CM | POA: Diagnosis not present

## 2017-06-25 DIAGNOSIS — E118 Type 2 diabetes mellitus with unspecified complications: Secondary | ICD-10-CM | POA: Diagnosis present

## 2017-06-25 DIAGNOSIS — I130 Hypertensive heart and chronic kidney disease with heart failure and stage 1 through stage 4 chronic kidney disease, or unspecified chronic kidney disease: Secondary | ICD-10-CM | POA: Diagnosis not present

## 2017-06-25 DIAGNOSIS — E876 Hypokalemia: Secondary | ICD-10-CM | POA: Diagnosis present

## 2017-06-25 DIAGNOSIS — I5041 Acute combined systolic (congestive) and diastolic (congestive) heart failure: Secondary | ICD-10-CM | POA: Diagnosis not present

## 2017-06-25 DIAGNOSIS — N179 Acute kidney failure, unspecified: Secondary | ICD-10-CM | POA: Diagnosis not present

## 2017-06-25 DIAGNOSIS — I255 Ischemic cardiomyopathy: Secondary | ICD-10-CM | POA: Diagnosis not present

## 2017-06-25 DIAGNOSIS — Z923 Personal history of irradiation: Secondary | ICD-10-CM | POA: Diagnosis not present

## 2017-06-25 DIAGNOSIS — E7800 Pure hypercholesterolemia, unspecified: Secondary | ICD-10-CM | POA: Diagnosis present

## 2017-06-25 DIAGNOSIS — I739 Peripheral vascular disease, unspecified: Secondary | ICD-10-CM | POA: Diagnosis present

## 2017-06-25 DIAGNOSIS — I2510 Atherosclerotic heart disease of native coronary artery without angina pectoris: Secondary | ICD-10-CM | POA: Diagnosis present

## 2017-06-25 DIAGNOSIS — I10 Essential (primary) hypertension: Secondary | ICD-10-CM | POA: Diagnosis present

## 2017-06-25 DIAGNOSIS — I701 Atherosclerosis of renal artery: Secondary | ICD-10-CM | POA: Diagnosis present

## 2017-06-25 DIAGNOSIS — R0602 Shortness of breath: Secondary | ICD-10-CM | POA: Diagnosis not present

## 2017-06-25 DIAGNOSIS — R911 Solitary pulmonary nodule: Secondary | ICD-10-CM | POA: Diagnosis present

## 2017-06-25 DIAGNOSIS — R079 Chest pain, unspecified: Secondary | ICD-10-CM | POA: Diagnosis not present

## 2017-06-25 LAB — HEPATIC FUNCTION PANEL
ALT: 8 U/L — AB (ref 14–54)
AST: 21 U/L (ref 15–41)
Albumin: 3.6 g/dL (ref 3.5–5.0)
Alkaline Phosphatase: 86 U/L (ref 38–126)
BILIRUBIN INDIRECT: 0.8 mg/dL (ref 0.3–0.9)
Bilirubin, Direct: 0.2 mg/dL (ref 0.1–0.5)
TOTAL PROTEIN: 7.2 g/dL (ref 6.5–8.1)
Total Bilirubin: 1 mg/dL (ref 0.3–1.2)

## 2017-06-25 LAB — CBC WITH DIFFERENTIAL/PLATELET
Abs Immature Granulocytes: 0 10*3/uL (ref 0.0–0.1)
BASOS ABS: 0 10*3/uL (ref 0.0–0.1)
BASOS PCT: 0 %
EOS ABS: 0 10*3/uL (ref 0.0–0.7)
EOS PCT: 0 %
HCT: 31 % — ABNORMAL LOW (ref 36.0–46.0)
Hemoglobin: 9.6 g/dL — ABNORMAL LOW (ref 12.0–15.0)
Immature Granulocytes: 0 %
Lymphocytes Relative: 7 %
Lymphs Abs: 0.6 10*3/uL — ABNORMAL LOW (ref 0.7–4.0)
MCH: 28.4 pg (ref 26.0–34.0)
MCHC: 31 g/dL (ref 30.0–36.0)
MCV: 91.7 fL (ref 78.0–100.0)
MONO ABS: 0.5 10*3/uL (ref 0.1–1.0)
Monocytes Relative: 6 %
Neutro Abs: 6.6 10*3/uL (ref 1.7–7.7)
Neutrophils Relative %: 85 %
PLATELETS: 116 10*3/uL — AB (ref 150–400)
RBC: 3.38 MIL/uL — ABNORMAL LOW (ref 3.87–5.11)
RDW: 16.6 % — AB (ref 11.5–15.5)
WBC: 7.7 10*3/uL (ref 4.0–10.5)

## 2017-06-25 LAB — HEMOGLOBIN A1C
HEMOGLOBIN A1C: 6 % — AB (ref 4.8–5.6)
MEAN PLASMA GLUCOSE: 125.5 mg/dL

## 2017-06-25 LAB — COMPREHENSIVE METABOLIC PANEL
ALT: 9 U/L — ABNORMAL LOW (ref 14–54)
ANION GAP: 11 (ref 5–15)
AST: 17 U/L (ref 15–41)
Albumin: 3.4 g/dL — ABNORMAL LOW (ref 3.5–5.0)
Alkaline Phosphatase: 89 U/L (ref 38–126)
BUN: 50 mg/dL — ABNORMAL HIGH (ref 6–20)
CHLORIDE: 116 mmol/L — AB (ref 101–111)
CO2: 17 mmol/L — AB (ref 22–32)
Calcium: 10 mg/dL (ref 8.9–10.3)
Creatinine, Ser: 3.45 mg/dL — ABNORMAL HIGH (ref 0.44–1.00)
GFR calc Af Amer: 14 mL/min — ABNORMAL LOW (ref >60)
GFR, EST NON AFRICAN AMERICAN: 12 mL/min — AB (ref >60)
Glucose, Bld: 132 mg/dL — ABNORMAL HIGH (ref 65–99)
Potassium: 3.6 mmol/L (ref 3.5–5.1)
SODIUM: 144 mmol/L (ref 135–145)
Total Bilirubin: 0.7 mg/dL (ref 0.3–1.2)
Total Protein: 7.2 g/dL (ref 6.5–8.1)

## 2017-06-25 LAB — GLUCOSE, CAPILLARY: GLUCOSE-CAPILLARY: 134 mg/dL — AB (ref 65–99)

## 2017-06-25 LAB — I-STAT TROPONIN, ED: TROPONIN I, POC: 1.2 ng/mL — AB (ref 0.00–0.08)

## 2017-06-25 LAB — TROPONIN I: Troponin I: 2.72 ng/mL (ref <0.03)

## 2017-06-25 LAB — MAGNESIUM: Magnesium: 1.9 mg/dL (ref 1.7–2.4)

## 2017-06-25 LAB — HEPARIN LEVEL (UNFRACTIONATED): HEPARIN UNFRACTIONATED: 0.84 [IU]/mL — AB (ref 0.30–0.70)

## 2017-06-25 LAB — BRAIN NATRIURETIC PEPTIDE: B Natriuretic Peptide: 1355.5 pg/mL — ABNORMAL HIGH (ref 0.0–100.0)

## 2017-06-25 LAB — I-STAT CG4 LACTIC ACID, ED: LACTIC ACID, VENOUS: 0.92 mmol/L (ref 0.5–1.9)

## 2017-06-25 MED ORDER — INSULIN ASPART 100 UNIT/ML ~~LOC~~ SOLN: 0.0000 [IU] | Freq: Every day | SUBCUTANEOUS | Status: DC

## 2017-06-25 MED ORDER — HEPARIN BOLUS VIA INFUSION
4000.0000 [IU] | Freq: Once | INTRAVENOUS | Status: DC
  Filled 2017-06-25: qty 4000

## 2017-06-25 MED ORDER — ROSUVASTATIN CALCIUM 20 MG PO TABS
20.0000 mg | ORAL_TABLET | Freq: Every day | ORAL | Status: DC
  Administered 2017-06-26 – 2017-06-28 (×3): 20 mg via ORAL
  Filled 2017-06-25 (×3): qty 1

## 2017-06-25 MED ORDER — ASPIRIN EC 81 MG PO TBEC: 81.0000 mg | DELAYED_RELEASE_TABLET | Freq: Every day | ORAL | Status: DC

## 2017-06-25 MED ORDER — AMLODIPINE BESYLATE 10 MG PO TABS
10.0000 mg | ORAL_TABLET | Freq: Every day | ORAL | Status: DC
  Administered 2017-06-26 – 2017-06-29 (×4): 10 mg via ORAL
  Filled 2017-06-25 (×4): qty 1

## 2017-06-25 MED ORDER — HEPARIN BOLUS VIA INFUSION
4000.0000 [IU] | Freq: Once | INTRAVENOUS | Status: AC
  Administered 2017-06-25: 4000 [IU] via INTRAVENOUS
  Filled 2017-06-25: qty 4000

## 2017-06-25 MED ORDER — ACETAMINOPHEN 325 MG PO TABS: 650.0000 mg | ORAL_TABLET | ORAL | Status: DC | PRN

## 2017-06-25 MED ORDER — NITROGLYCERIN 0.4 MG SL SUBL: 0.4000 mg | SUBLINGUAL_TABLET | SUBLINGUAL | Status: DC | PRN

## 2017-06-25 MED ORDER — METOPROLOL SUCCINATE ER 25 MG PO TB24
25.0000 mg | ORAL_TABLET | Freq: Every day | ORAL | Status: DC
  Administered 2017-06-26 – 2017-06-29 (×4): 25 mg via ORAL
  Filled 2017-06-25 (×4): qty 1

## 2017-06-25 MED ORDER — METOPROLOL TARTRATE 5 MG/5ML IV SOLN
5.0000 mg | Freq: Once | INTRAVENOUS | Status: AC
  Administered 2017-06-25: 5 mg via INTRAVENOUS
  Filled 2017-06-25: qty 5

## 2017-06-25 MED ORDER — GLECAPREVIR-PIBRENTASVIR 100-40 MG PO TABS
3.0000 | ORAL_TABLET | Freq: Every day | ORAL | Status: DC
  Administered 2017-06-26 – 2017-06-29 (×4): 3 via ORAL
  Filled 2017-06-25 (×3): qty 3

## 2017-06-25 MED ORDER — PNEUMOCOCCAL VAC POLYVALENT 25 MCG/0.5ML IJ INJ: 0.5000 mL | INJECTION | INTRAMUSCULAR | Status: DC

## 2017-06-25 MED ORDER — ASPIRIN 325 MG PO TABS: 325.0000 mg | ORAL_TABLET | Freq: Once | ORAL | Status: DC

## 2017-06-25 MED ORDER — HEPARIN (PORCINE) IN NACL 100-0.45 UNIT/ML-% IJ SOLN
850.0000 [IU]/h | INTRAMUSCULAR | Status: DC
  Filled 2017-06-25: qty 250

## 2017-06-25 MED ORDER — NITROGLYCERIN IN D5W 200-5 MCG/ML-% IV SOLN
0.0000 ug/min | Freq: Once | INTRAVENOUS | Status: AC
  Administered 2017-06-25: 5 ug/min via INTRAVENOUS
  Filled 2017-06-25: qty 250

## 2017-06-25 MED ORDER — FUROSEMIDE 10 MG/ML IJ SOLN
80.0000 mg | Freq: Two times a day (BID) | INTRAMUSCULAR | Status: DC
  Administered 2017-06-25 – 2017-06-26 (×3): 80 mg via INTRAVENOUS
  Filled 2017-06-25 (×3): qty 8

## 2017-06-25 MED ORDER — ASPIRIN EC 81 MG PO TBEC
81.0000 mg | DELAYED_RELEASE_TABLET | Freq: Every day | ORAL | Status: DC
  Administered 2017-06-26 – 2017-06-29 (×4): 81 mg via ORAL
  Filled 2017-06-25 (×4): qty 1

## 2017-06-25 MED ORDER — FUROSEMIDE 10 MG/ML IJ SOLN: 80.0000 mg | Freq: Once | INTRAMUSCULAR | Status: DC

## 2017-06-25 MED ORDER — ORAL CARE MOUTH RINSE
15.0000 mL | Freq: Two times a day (BID) | OROMUCOSAL | Status: DC
  Administered 2017-06-26: 15 mL via OROMUCOSAL

## 2017-06-25 MED ORDER — FUROSEMIDE 10 MG/ML IJ SOLN
40.0000 mg | Freq: Once | INTRAMUSCULAR | Status: AC
  Administered 2017-06-25: 40 mg via INTRAVENOUS
  Filled 2017-06-25: qty 4

## 2017-06-25 MED ORDER — CLONIDINE HCL 0.1 MG PO TABS
0.1000 mg | ORAL_TABLET | Freq: Two times a day (BID) | ORAL | Status: DC
  Administered 2017-06-25 – 2017-06-29 (×8): 0.1 mg via ORAL
  Filled 2017-06-25 (×8): qty 1

## 2017-06-25 MED ORDER — INSULIN ASPART 100 UNIT/ML ~~LOC~~ SOLN
0.0000 [IU] | Freq: Three times a day (TID) | SUBCUTANEOUS | Status: DC
  Administered 2017-06-26 – 2017-06-29 (×4): 1 [IU] via SUBCUTANEOUS

## 2017-06-25 MED ORDER — ONDANSETRON HCL 4 MG/2ML IJ SOLN
4.0000 mg | Freq: Four times a day (QID) | INTRAMUSCULAR | Status: DC | PRN
  Administered 2017-06-26: 4 mg via INTRAVENOUS
  Filled 2017-06-25: qty 2

## 2017-06-25 MED ORDER — FENOFIBRATE 54 MG PO TABS
54.0000 mg | ORAL_TABLET | Freq: Every day | ORAL | Status: DC
  Administered 2017-06-26 – 2017-06-28 (×3): 54 mg via ORAL
  Filled 2017-06-25 (×4): qty 1

## 2017-06-25 MED ORDER — NITROGLYCERIN IN D5W 200-5 MCG/ML-% IV SOLN
0.0000 ug/min | INTRAVENOUS | Status: DC
  Administered 2017-06-26: 18 ug/min via INTRAVENOUS
  Administered 2017-06-26: 100 ug/min via INTRAVENOUS
  Filled 2017-06-25 (×2): qty 250

## 2017-06-25 MED ORDER — HEPARIN (PORCINE) IN NACL 100-0.45 UNIT/ML-% IJ SOLN
950.0000 [IU]/h | INTRAMUSCULAR | Status: DC
Start: 2017-06-25 — End: 2017-06-28
  Administered 2017-06-25 (×2): 1000 [IU]/h via INTRAVENOUS
  Administered 2017-06-26: 850 [IU]/h via INTRAVENOUS
  Administered 2017-06-27: 950 [IU]/h via INTRAVENOUS
  Filled 2017-06-25 (×3): qty 250

## 2017-06-25 MED ORDER — ACETAMINOPHEN 500 MG PO TABS: 1000.0000 mg | ORAL_TABLET | Freq: Four times a day (QID) | ORAL | Status: DC | PRN

## 2017-06-25 NOTE — H&P (Addendum)
Cardiology Consultation:   Patient ID: [id number] [id number]; [id number]; 07-16-1945   Admit date: 15-Nov-202019 Date of Consult: 15-Nov-202019  Primary Care Provider: [name], MD Primary Cardiologist: [name], MD  Primary Electrophysiologist:  Dr. Lovena Le   Patient Profile:   Kristen Bradley is a 72 y.o. female with a hx of CAD, ICM, ICD, chronic systolic HF, Renal artery stenosis with stent, PAD, HTN, HLD, DM and tobacco abuse who is being seen today for the evaluation of chest pain at the request of Dr. Darl Householder.   Chief Complaint:  Chest pain   History of Present Illness:   Kristen Bradley has a hx of CAD, with MI, LBBB, ICM, ICD, chronic systolic HF, Renal artery stenosis with stent, PAD, HTN, HLD, DM and tobacco abuse.  Cardiac cath 02/03/16 with severe 3 vessel disease with severe calcification of all vessels and echo with EF 25%, mild MR.  Not felt to be a candidate for CABG or PCI.  She had BiV ICD (MDT) placed 11/05/16 with a His bundle lead placed and narrowing of her complex.  Her ARB was stopped by Nephrology and statin was stopped due to elevated LFTs on lipitor 80 daily.   Last echo 03/14/17 with improved EF to 55-60%, G1 DD, PA pk pressure 34 mmHg.  Also hx of AAA at 3.6 cm at widest and stable on recent PET scan.  Has seen pulmonary for enlarging Rt LL lung nodule.  This is concerning for lung cancer or recurrent breast cancer or renal cell cancer.  She was scheduled for PET scan.     Pt now presents to ER with more sudden onset of SOB and chest pain. Had been in her usual state of health until 2 pm yesterday then with SOB and throat and chest tightness.  She did take NTG which helped.  When she arrived to ER her chest discomfort was 8-9 and now 5.  She is on IV heparin and IV NTG at 30 mcg per min.  EKG SR with BiV pacing QRS 125 ms  Na 144, K+ 3.6, C02 17, BUN 50  Cr 3.45  BNP 1355 Troponin poc 1.20 Hgb 9.6, WBC 7.7 Plts 116   CXR:  Right basilar airspace disease. Followup PA  and lateral chest X-ray is recommended in 3-4 weeks following trial of antibiotic therapy to ensure resolution and exclude underlying malignancy.  PET SCAN The right lower lobe nodule is blurred by motion artifact and has slightly indistinct nodules but measures about 1.0 by 0.6. Maximum standard uptake value is 1.2. Although the maximum SUV is somewhat reassuring, low-grade adenocarcinoma is not excluded, and the probable increase in size compared to 2015 is still concerning. At a minimum, surveillance by chest CT is likely warranted Please see report.   Past Medical History:  Diagnosis Date  . Adrenal tumor 08/2007   surgery  . Cancer of right breast [facility])    s/p lumpectomy, XRT  . Carotid artery disease (HCC)    s/p bilateral CEA  . Hepatitis C    "got treatment for it" (01/29/2016)  . HFrEF (heart failure with reduced ejection fraction) ([place])   . High cholesterol    takes Lopid daily  . History of UTI    takes Diflucan dail--pt states no uti in a couple of yrs though  . Hypertension    takes Losartan,Amlodipine,HCTZ,and Atenolol daily and Clatarpres  . Ischemic cardiomyopathy    a. 11/2016 s/p MDT BiV ICD w/ his bundle pacing.  . Joint  pain    legs  . Renal artery stenosis (HCC)    Right renal artery stent 05/31/06  . Stroke [facility]) Jan. 7, 2014  . Tobacco abuse   . Type II diabetes mellitus ([place])     Past Surgical History:  Procedure Laterality Date  . ABDOMINAL HYSTERECTOMY    . ADRENAL GLAND SURGERY  2009  . BIV ICD INSERTION CRT-D N/A 11/05/2016   Procedure: BIV ICD INSERTION CRT-D;  Surgeon: Evans Lance, MD;  Location: [facility];  Service: Cardiovascular;  Laterality: N/A;  . BREAST LUMPECTOMY Right 2009  . CARDIAC CATHETERIZATION N/A 02/03/2016   Procedure: Right/Left Heart Cath and Coronary Angiography;  Surgeon: Burnell Blanks, MD;  Location: [facility];  Service: Cardiovascular;  Laterality: N/A;  . ENDARTERECTOMY  01/05/2012    Procedure: ENDARTERECTOMY CAROTID;  Surgeon: Conrad Lake Morton-Berry[place], MD;  Location: [facility];  Service: Vascular;  Laterality: Right;  Right Carotid Artery Endarterectomy, right stump pressure, intraoperative ultrasound  . ENDARTERECTOMY  02/08/2012   Procedure: ENDARTERECTOMY CAROTID;  Surgeon: Conrad [place], MD;  Location: [place];  Service: Vascular;  Laterality: Left;  . LUMBAR DISC SURGERY    . PATCH ANGIOPLASTY  02/08/2012   Procedure: PATCH ANGIOPLASTY;  Surgeon: Conrad [place], MD;  Location: [facility];  Service: Vascular;  Laterality: Left;  Carotid artery patch angioplasty using Vascu-Guard bovine patch 1cm x 6cm.  . RENAL ARTERY STENT Right 05/2006     Home Medications:  Prior to Admission medications   Medication Sig Start Date End Date Taking? Authorizing Provider  acetaminophen (TYLENOL) 500 MG tablet Take 1,000 mg by mouth every 6 (six) hours as needed for mild pain or headache.    Yes [provider]  amLODipine (NORVASC) 10 MG tablet Take 10 mg by mouth daily.    Yes [provider]  aspirin EC 81 MG tablet Take 81 mg by mouth daily.     Yes [provider]  cloNIDine (CATAPRES) 0.1 MG tablet Take 0.1 mg by mouth 2 (two) times daily.     Yes [provider]  fenofibrate (TRICOR) 48 MG tablet Take 1 tablet (48 mg total) by mouth daily. 05/03/17  Yes Evans Lance, MD  glipiZIDE (GLUCOTROL) 5 MG tablet Take 2.5 mg by mouth daily before breakfast.    Yes [provider]  IRON PO Take 1 tablet by mouth daily.   Yes [provider]  isosorbide mononitrate (IMDUR) 30 MG 24 hr tablet TAKE 1 TABLET BY MOUTH ONCE DAILY 05/24/17  Yes Burnell Blanks, MD  MAVYRET 100-40 MG TABS Take 3 tablets by mouth daily. 06/06/17  Yes [provider]  metoprolol succinate (TOPROL-XL) 25 MG 24 hr tablet TAKE 1 TABLET BY MOUTH ONCE DAILY 05/24/17  Yes Burnell Blanks, MD  nitroGLYCERIN (NITROSTAT) 0.4 MG SL tablet Place 1 tablet (0.4 mg total) under the  tongue every 5 (five) minutes as needed for chest pain. 03/02/16  Yes Burnell Blanks, MD  rosuvastatin (CRESTOR) 20 MG tablet Take 1 tablet (20 mg total) by mouth daily. 05/03/17 08/01/17 Yes Evans Lance, MD  ALPRAZolam Duanne Moron) 0.5 MG tablet Take 1 tablet 30-45 mins prior to PET scan. Patient not taking: Reported on 09/29/2017 06/14/17   Marshell Garfinkel, MD  furosemide (LASIX) 80 MG tablet Take 1 tablet (80 mg total) by mouth 2 (two) times daily. Patient not taking: Reported on 09/29/2017 07/30/16   Burnell Blanks, MD  Inpatient Medications: Scheduled Meds: . aspirin  325 mg Oral Once  . furosemide  80 mg Intravenous Once  . metoprolol tartrate  5 mg Intravenous Once   Continuous Infusions: . heparin 1,000 Units/hr (06/25/17 1313)   PRN Meds:   Allergies:    Allergies  Allergen Reactions  . Chlorhexidine Gluconate Itching  . Ace Inhibitors Cough  . Dilaudid [Hydromorphone Hcl] Itching    Social History:   Social History   Socioeconomic History  . Marital status: Divorced    Spouse name: Not on file  . Number of children: 1  . Years of education: Not on file  . Highest education level: Not on file  Occupational History  . Occupation: Retired-truck Education administrator: RETIRED  Social Needs  . Financial resource strain: Not on file  . Food insecurity:    Worry: Not on file    Inability: Not on file  . Transportation needs:    Medical: Not on file    Non-medical: Not on file  Tobacco Use  . Smoking status: Former Smoker    Packs/day: 0.50    Years: 53.00    Pack years: 26.50    Types: Cigarettes    Last attempt to quit: 03/2016    Years since quitting: 1.3  . Smokeless tobacco: Never Used  Substance and Sexual Activity  . Alcohol use: No    Alcohol/week: 0.0 oz  . Drug use: No  . Sexual activity: Not on file  Lifestyle  . Physical activity:    Days per week: Not on file    Minutes per session: Not on file  . Stress: Not on file    Relationships  . Social connections:    Talks on phone: Not on file    Gets together: Not on file    Attends religious service: Not on file    Active member of club or organization: Not on file    Attends meetings of clubs or organizations: Not on file    Relationship status: Not on file  . Intimate partner violence:    Fear of current or ex partner: Not on file    Emotionally abused: Not on file    Physically abused: Not on file    Forced sexual activity: Not on file  Other Topics Concern  . Not on file  Social History Narrative  . Not on file    Family History:    Family History  Problem Relation Age of Onset  . Cancer Mother        ? type  . Cancer Father        ? type  . Diabetes Sister   . Hypertension Sister   . Diabetes Brother   . Hypertension Brother   . CAD Neg Hx      ROS:  Please see the history of present illness.  General:no colds or fevers, no weight changes Skin:no rashes or ulcers HEENT:no blurred vision, no congestion CV:see HPI PUL:see HPI GI:no diarrhea constipation or melena, no indigestion GU:no hematuria, no dysuria MS:no joint pain, no claudication Neuro:no syncope, no lightheadedness Endo:+ diabetes, no thyroid disease  All other ROS reviewed and negative.     Physical Exam/Data:   Vitals:   06/25/17 1400 06/25/17 1415 06/25/17 1430 06/25/17 1445  BP: (!) 144/70 (!) 142/76 136/66 (!) 142/68  Pulse: 85 88 87 86  Resp: (!) 21 20 (!) 22 (!) 21  Temp:      TempSrc:  SpO2: 96% 96% 96% 96%  Weight:       No intake or output data in the 24 hours ending 06/25/17 1503 Filed Weights   06/25/17 1200  Weight: 154 lb (69.9 kg)   Body mass index is 24.86 kg/m.  General:  Well nourished, well developed, in obvious dyspnea and with chest tightness. HEENT: normal Lymph: no adenopathy Neck: ++ JVD Endocrine:  No thryomegaly Vascular: No carotid bruits; pedal pulses 1+ bilaterally  Cardiac:  normal S1, S2; RRR; no murmur gallup or  rub Lungs:  Breath sounds to auscultation bilaterally, no wheezing, rhonchi + rales 1/3 up bil Abd: soft, nontender, no hepatomegaly  Ext: no edema Musculoskeletal:  No deformities, BUE and BLE strength normal and equal Skin: warm and dry  Neuro:  Alert and oriented X 3 MAE follows commands, no focal abnormalities noted Psych:  Normal affect    Relevant CV Studies:  Echo 03/14/17  Study Conclusions  - Left ventricle: The cavity size was normal. Wall thickness was   increased in a pattern of mild LVH. Systolic function was normal.   The estimated ejection fraction was in the range of 55% to 60%.   Doppler parameters are consistent with abnormal left ventricular   relaxation (grade 1 diastolic dysfunction). - Pulmonary arteries: PA peak pressure: 34 mm Hg (S).   Cardiac cath 02/03/16:  Ost RCA to Prox RCA lesion, 60 %stenosed.  Mid RCA lesion, 80 %stenosed.  Dist RCA lesion, 60 %stenosed.  RPDA lesion, 99 %stenosed.  Ost LM lesion, 50 %stenosed.  Ost Cx to Prox Cx lesion, 70 %stenosed.  Ost 3rd Mrg to 3rd Mrg lesion, 99 %stenosed.  2nd Mrg lesion, 50 %stenosed.  Ost 2nd Mrg to 2nd Mrg lesion, 20 %stenosed.  Prox Cx to Mid Cx lesion, 20 %stenosed.  Mid LAD lesion, 90 %stenosed.  1st Diag lesion, 90 %stenosed.  Dist LAD lesion, 80 %stenosed.  LV end diastolic pressure is normal.  Mid LAD to Dist LAD lesion, 20 %stenosed.  Ost LAD to Prox LAD lesion, 20 %stenosed.  1. Severe calcific three vessel CAD 2. Normal filling pressures 3. Ischemic cardiomyopathy (LV function reduced by echo)  Recommendations: She has severe calcification in all three vessels. Her anatomy is not favorable for PCI. I will ask CT surgery to see her to discuss CABG.  Diagnostic Diagram          Laboratory Data:  Chemistry Recent Labs  Lab 06/25/17 1143  NA 144  K 3.6  CL 116*  CO2 17*  GLUCOSE 132*  BUN 50*  CREATININE 3.45*  CALCIUM 10.0  GFRNONAA 12*  GFRAA  14*  ANIONGAP 11    Recent Labs  Lab 06/25/17 1143  PROT 7.2  ALBUMIN 3.4*  AST 17  ALT 9*  ALKPHOS 89  BILITOT 0.7   Hematology Recent Labs  Lab 06/25/17 1143  WBC 7.7  RBC 3.38*  HGB 9.6*  HCT 31.0*  MCV 91.7  MCH 28.4  MCHC 31.0  RDW 16.6*  PLT 116*   Cardiac EnzymesNo results for input(s): TROPONINI in the last 168 hours.  Recent Labs  Lab 06/25/17 1206  TROPIPOC 1.20*    BNP Recent Labs  Lab 06/25/17 1143  BNP 1,355.5*    DDimer No results for input(s): DDIMER in the last 168 hours.  Radiology/Studies:  Dg Chest 2 View  Result Date: 06-20-2017 CLINICAL DATA:  Chest tightness and short of breath EXAM: CHEST - 2 VIEW COMPARISON:  11/16/2016 FINDINGS: The heart is  moderately enlarged. Left subclavian AICD device is stable. Vascular congestion. Right basilar airspace disease. No pneumothorax. No pleural effusion. IMPRESSION: Right basilar airspace disease. Followup PA and lateral chest X-ray is recommended in 3-4 weeks following trial of antibiotic therapy to ensure resolution and exclude underlying malignancy. Electronically Signed   By: Marybelle Killings M.D.   On: 12/08/2017 12:31    Assessment and Plan:   1. Acute CHF last EF was normal in Feb this yr after HIS bundle lead placed for BiV ICD.  Will admit IV diuresis and recheck echo.  She is on IV NTG for chest pain and pre-load.  Will titrate as needed.  BNP 1355 2. NSTEMI, may be more demand with her severe calcified CAD.  Will continue IV NTG and IV heparin.  Serial troponins she has not been felt to be CABG candidate in the past. Pt refuses ASA of 325. 3. Severe CAD treating medically 4. ICM in past but now with BiVicd her EF had improved.   5. AKI Cr is elevated from last one I have from 10/2016 of 1.77 -once diuresed her Cr may improve will follow. She has seen nephrology 6. Chronic anemia hgb now 9.6   Was 10.9 in 10/2016.   7. Thrombocytopenia plts 116  Has seen oncology idiopathic 8. S/p BiV ICD HIS  bundle lead --no ICD shock 9. PAD with hx renal artery stent, and RCEA and LCEA  10. Diabetes -2 hold metformin and add SSI 11. HTN continue home meds   For questions or updates, please contact [place] Please consult [email or web address] for contact info under Cardiology/STEMI.   Signed, Cecilie Kicks, NP  08-24-2017 3:03 PM   Cardiology Attending  Patient seen and examined. Agree with the findings as noted above. The patient was in her usual state of health until yesterday when she developed sob and sscp which has persisted. She presents with those complaints and has been treated with IV ntg and given IV lasix. Her renal function is poor (worse compared to 10/26/16) with stage 5 renal failure. She denies dietary non-compliance. Her ECG is unrevealing in the setting of chronic His bundle pacing. She does have inferolateral ST depression of uncertain clinical significance. After last heart cath she was felt not to be a candidate for any invasive treatment of her severe 3 vessel CAD. Her exam reveals a pleasant but ill appearing woman, mild resp distress. Lungs revealed rales in bases and cv reveals a RRR with an S4. She has warm extremities and soft abdomen. Labs are unrevealing except for the renal function and elevated BNP. Troponin is elevated at 1.2. A/P 1. NSTEMI - her options are limited. We will attempt to diurese, give IV NTG and IV lopressor 2. Acute on chronic systolic heart failure - she had normalization of her LV function but I suspect she has worsened now. We will need to repeat a 2D echo. Her BNP is elevated. IV lasix will have to be given judiciously in the setting of renal failure. 3. Acute on chronic stage 5 renal failure - she may soon be approaching consideration for HD. She will need a consult from the renal service. 4. Disp - her prognosis is guarded in light of multiple comorbidities. We will follow closely   Mikle Bosworth.D.

## 2017-06-25 NOTE — ED Provider Notes (Signed)
[facility] EMERGENCY DEPARTMENT Provider Note   CSN: [id number] Arrival date & time:        History   Chief Complaint Chief Complaint  Patient presents with  . Chest Pain    HPI Kristen Bradley is a 72 y.o. female hx of CAD, heart failure s/p ICD, DM, here presenting with chest pain, chest tightness, shortness of breath.  Patient states that since yesterday, she has been having some chest tightness and pressure.  States that the pain does radiate to her neck.  She states that she is more short of breath with exertion as well as laying down.  She has some subjective weight gain but did not weigh herself.  Patient also states that she has a little bit more edema than usual.  She took 3 nitros at home but pain is down relieved so she called EMS.  Was given another nitro by EMS. Patient had intolerance to Aspirin so no aspirin was given.   The history is provided by the patient.    Past Medical History:  Diagnosis Date  . Adrenal tumor 08/2007   surgery  . Cancer of right breast [facility])    s/p lumpectomy, XRT  . Carotid artery disease (HCC)    s/p bilateral CEA  . Hepatitis C    "got treatment for it" (01/29/2016)  . HFrEF (heart failure with reduced ejection fraction) ([place])   . High cholesterol    takes Lopid daily  . History of UTI    takes Diflucan dail--pt states no uti in a couple of yrs though  . Hypertension    takes Losartan,Amlodipine,HCTZ,and Atenolol daily and Clatarpres  . Ischemic cardiomyopathy    a. 11/2016 s/p MDT BiV ICD w/ his bundle pacing.  . Joint pain    legs  . Renal artery stenosis (HCC)    Right renal artery stent 05/31/06  . Stroke [facility]) Jan. 7, 2014  . Tobacco abuse   . Type II diabetes mellitus [facility])     Patient Active Problem List   Diagnosis Date Noted  . Type II diabetes mellitus ([place])   . Tobacco abuse   . Renal artery stenosis ([place])   . Joint pain   . Ischemic cardiomyopathy   . History of UTI   . High cholesterol     . HFrEF (heart failure with reduced ejection fraction) ([place])   . Hepatitis C   . Carotid artery disease ([place])   . Cancer of right breast ([place])   . Chronic systolic (congestive) heart failure ([place]) 11/05/2016  . Dyspnea   . CKD (chronic kidney disease) stage 4, GFR 15-29 ml/min (HCC)   . CAD, multiple vessel   . Chronic systolic heart failure ([place]) 03/03/2016  . 3-vessel CAD - treated medically  02/26/2016  . Chronic ITP (idiopathic thrombocytopenia) (HCC) 02/24/2016  . Acute renal failure superimposed on stage 3 chronic kidney disease ([place])   . Sinus bradycardia   . Renal failure syndrome   . Sinus pause   . NSTEMI (non-ST elevated myocardial infarction) ([place])   . Cardiomyopathy, ischemic   . LBBB- new 01/30/2016  . Elevated troponin I level 01/30/2016  . Thrombocytopenia- plts 81K 01/30/2016  . Elevated brain natriuretic peptide (BNP) level 01/30/2016  . Acute respiratory failure ([place]) 01/29/2016  . Community acquired pneumonia 01/29/2016  . Hypertension 01/29/2016  . Acute kidney injury ([place]) 01/29/2016  . Diabetes mellitus with complication ([place])   . Solitary pulmonary nodule 08/10/2013  . Atherosclerosis of  native artery of extremity with intermittent claudication ([place]) 01/26/2013  . Pain in limb 09/08/2012  . Aftercare following surgery of the circulatory system, [place] 05/26/2012  . Stroke ([place]) 02/08/2012  . Occlusion and stenosis of carotid artery without mention of cerebral infarction 12/17/2011  . PAD (peripheral artery disease) ([place]) 11/26/2011  . Peripheral vascular disease ([place]) 11/05/2011  . Intermittent claudication ([place]) 11/05/2011  . Carotid stenosis, bilateral-s/p Bilateral CEA 11/05/2011  . Adrenal tumor 08/02/2007    Past Surgical History:  Procedure Laterality Date  . ABDOMINAL HYSTERECTOMY    . ADRENAL GLAND SURGERY  2009  . BIV ICD INSERTION CRT-D N/A 11/05/2016   Procedure: BIV ICD INSERTION CRT-D;  Surgeon: Evans Lance, MD;  Location: [place]  CV LAB;  Service: Cardiovascular;  Laterality: N/A;  . BREAST LUMPECTOMY Right 2009  . CARDIAC CATHETERIZATION N/A 02/03/2016   Procedure: Right/Left Heart Cath and Coronary Angiography;  Surgeon: Burnell Blanks, MD;  Location: [place] CV LAB;  Service: Cardiovascular;  Laterality: N/A;  . ENDARTERECTOMY  01/05/2012   Procedure: ENDARTERECTOMY CAROTID;  Surgeon: Conrad Ness, MD;  Location: [facility] [place] [facility] [place];  Service: Vascular;  Laterality: Right;  Right Carotid Artery Endarterectomy, right stump pressure, intraoperative ultrasound  . ENDARTERECTOMY  02/08/2012   Procedure: ENDARTERECTOMY CAROTID;  Surgeon: Conrad Loudon, MD;  Location: [place];  Service: Vascular;  Laterality: Left;  . LUMBAR DISC SURGERY    . PATCH ANGIOPLASTY  02/08/2012   Procedure: PATCH ANGIOPLASTY;  Surgeon: Conrad Modoc, MD;  Location: [facility] OR;  Service: Vascular;  Laterality: Left;  Carotid artery patch angioplasty using Vascu-Guard bovine patch 1cm x 6cm.  . RENAL ARTERY STENT Right 05/2006     OB History   None      Home Medications    Prior to Admission medications   Medication Sig Start Date End Date Taking? Authorizing Provider  acetaminophen (TYLENOL) 500 MG tablet Take 1,000 mg by mouth every 6 (six) hours as needed for mild pain or headache.    Yes [provider]  amLODipine (NORVASC) 10 MG tablet Take 10 mg by mouth daily.    Yes [provider]  aspirin EC 81 MG tablet Take 81 mg by mouth daily.     Yes [provider]  cloNIDine (CATAPRES) 0.1 MG tablet Take 0.1 mg by mouth 2 (two) times daily.     Yes [provider]  fenofibrate (TRICOR) 48 MG tablet Take 1 tablet (48 mg total) by mouth daily. 05/03/17  Yes Evans Lance, MD  glipiZIDE (GLUCOTROL) 5 MG tablet Take 2.5 mg by mouth daily before breakfast.    Yes [provider]  IRON PO Take 1 tablet by mouth daily.   Yes [provider]  isosorbide mononitrate (IMDUR) 30 MG 24 hr tablet TAKE 1 TABLET BY  MOUTH ONCE DAILY 05/24/17  Yes Burnell Blanks, MD  MAVYRET 100-40 MG TABS Take 3 tablets by mouth daily. 06/06/17  Yes [provider]  metoprolol succinate (TOPROL-XL) 25 MG 24 hr tablet TAKE 1 TABLET BY MOUTH ONCE DAILY 05/24/17  Yes Burnell Blanks, MD  nitroGLYCERIN (NITROSTAT) 0.4 MG SL tablet Place 1 tablet (0.4 mg total) under the tongue every 5 (five) minutes as needed for chest pain. 03/02/16  Yes Burnell Blanks, MD  rosuvastatin (CRESTOR) 20 MG tablet Take 1 tablet (20 mg total) by mouth daily. 05/03/17 08/01/17 Yes Evans Lance, MD  ALPRAZolam Duanne Moron) 0.5 MG tablet Take 1 tablet  30-45 mins prior to PET scan. Patient not taking: Reported on 10/28/2017 06/14/17   Marshell Garfinkel, MD  furosemide (LASIX) 80 MG tablet Take 1 tablet (80 mg total) by mouth 2 (two) times daily. Patient not taking: Reported on 10/28/2017 07/30/16   Burnell Blanks, MD    Family History Family History  Problem Relation Age of Onset  . Cancer Mother        ? type  . Cancer Father        ? type  . Diabetes Sister   . Hypertension Sister   . Diabetes Brother   . Hypertension Brother   . CAD Neg Hx     Social History Social History   Tobacco Use  . Smoking status: Former Smoker    Packs/day: 0.50    Years: 53.00    Pack years: 26.50    Types: Cigarettes    Last attempt to quit: 03/2016    Years since quitting: 1.3  . Smokeless tobacco: Never Used  Substance Use Topics  . Alcohol use: No    Alcohol/week: 0.0 oz  . Drug use: No     Allergies   Chlorhexidine gluconate; Ace inhibitors; and Dilaudid [hydromorphone hcl]   Review of Systems Review of Systems  Cardiovascular: Positive for chest pain.  All other systems reviewed and are negative.    Physical Exam Updated Vital Signs BP (!) 141/70   Pulse 86   Temp 98.7 F (37.1 C) (Oral)   Resp (!) 22   Wt 69.9 kg (154 lb)   SpO2 100%   BMI 24.86 kg/m   Physical Exam  Constitutional: She is  oriented to person, place, and time.  Chronically ill, tachypneic   HENT:  Head: Normocephalic.  Eyes: Pupils are equal, round, and reactive to light.  Neck: Normal range of motion.  Cardiovascular: Regular rhythm and normal pulses.  + JVD  Pulmonary/Chest:  Crackles bilateral bases   Musculoskeletal:  Trace edema bilaterally   Neurological: She is alert and oriented to person, place, and time.  Skin: Skin is warm. Capillary refill takes less than 2 seconds. Rash:   Psychiatric: She has a normal mood and affect.  Nursing note and vitals reviewed.    ED Treatments / Results  Labs (all labs ordered are listed, but only abnormal results are displayed) Labs Reviewed  CBC WITH DIFFERENTIAL/PLATELET - Abnormal; Notable for the following components:      Result Value   RBC 3.38 (*)    Hemoglobin 9.6 (*)    HCT 31.0 (*)    RDW 16.6 (*)    Platelets 116 (*)    Lymphs Abs 0.6 (*)    All other components within normal limits  COMPREHENSIVE METABOLIC PANEL - Abnormal; Notable for the following components:   Chloride 116 (*)    CO2 17 (*)    Glucose, Bld 132 (*)    BUN 50 (*)    Creatinine, Ser 3.45 (*)    Albumin 3.4 (*)    ALT 9 (*)    GFR calc non Af Amer 12 (*)    GFR calc Af Amer 14 (*)    All other components within normal limits  BRAIN NATRIURETIC PEPTIDE - Abnormal; Notable for the following components:   B Natriuretic Peptide 1,355.5 (*)    All other components within normal limits  I-STAT TROPONIN, ED - Abnormal; Notable for the following components:   Troponin i, poc 1.20 (*)    All other components within normal  limits  HEPARIN LEVEL (UNFRACTIONATED)  TROPONIN I  TROPONIN I  TROPONIN I  MAGNESIUM  TSH  I-STAT CG4 LACTIC ACID, ED    EKG EKG Interpretation  Date/Time:  Saturday Jun 25 2017 11:36:19 EDT Ventricular Rate:  96 PR Interval:    QRS Duration: 125 QT Interval:  401 QTC Calculation: 507 R Axis:   57 Text Interpretation:  Ectopic atrial rhythm  Atrial premature complex Short PR interval Probable LVH with secondary repol abnrm Prolonged QT interval paced. unchanged  Confirmed by Wandra Arthurs [phone]) on 2020/04/1817 11:44:13 AM Also confirmed by Wandra Arthurs [phone]), editor Philomena Doheny [phone])  on 2020/04/1817 1:27:19 PM   Radiology Dg Chest 2 View  Result Date: 2020/04/1817 CLINICAL DATA:  Chest tightness and short of breath EXAM: CHEST - 2 VIEW COMPARISON:  11/16/2016 FINDINGS: The heart is moderately enlarged. Left subclavian AICD device is stable. Vascular congestion. Right basilar airspace disease. No pneumothorax. No pleural effusion. IMPRESSION: Right basilar airspace disease. Followup PA and lateral chest X-ray is recommended in 3-4 weeks following trial of antibiotic therapy to ensure resolution and exclude underlying malignancy. Electronically Signed   By: Marybelle Killings M.D.   On: 02020/04/1817 12:31    Procedures Procedures (including critical care time)   CRITICAL CARE Performed by: Wandra Arthurs   Total critical care time: 30  minutes  Critical care time was exclusive of separately billable procedures and treating other patients.  Critical care was necessary to treat or prevent imminent or life-threatening deterioration.  Critical care was time spent personally by me on the following activities: development of treatment plan with patient and/or surrogate as well as nursing, discussions with consultants, evaluation of patient's response to treatment, examination of patient, obtaining history from patient or surrogate, ordering and performing treatments and interventions, ordering and review of laboratory studies, ordering and review of radiographic studies, pulse oximetry and re-evaluation of patient's condition.  Medications Ordered in ED Medications  heparin ADULT infusion 100 units/mL (25000 units/254mL sodium chloride 0.45%) (1,000 Units/hr Intravenous New Bag/Given 06/25/17 1313)  metoprolol tartrate (LOPRESSOR) injection 5 mg  (has no administration in time range)  furosemide (LASIX) injection 40 mg (has no administration in time range)  insulin aspart (novoLOG) injection 0-9 Units (has no administration in time range)  insulin aspart (novoLOG) injection 0-5 Units (has no administration in time range)  nitroGLYCERIN 50 mg in dextrose 5 % 250 mL (0.2 mg/mL) infusion (35 mcg/min Intravenous Rate/Dose Change 06/25/17 1459)  furosemide (LASIX) injection 40 mg (40 mg Intravenous Given 06/25/17 1247)  heparin bolus via infusion 4,000 Units (4,000 Units Intravenous Bolus from Bag 06/25/17 1326)     Initial Impression / Assessment and Plan / ED Course  I have reviewed the triage vital signs and the nursing notes.  Pertinent labs & imaging results that were available during my care of the patient were reviewed by me and considered in my medical decision making (see chart for details).    Kristen Bradley is a 72 y.o. female here with chest pain, SOB. Likely CHF exacerbation vs ACS. Will get labs, BNP, CXR. Still has chest pressure despite given 4 nitros. Will start on nitro drip, lasix.   12:40 pm Trop 1.2. There is new ST depressions on EKG. Still has pain. Started on heparin drip in addition to nitro drip. Consulted cardiology to see patient. Concerned for unstable angina vs NSTEMI vs CHF.   3 pm Cardiology present to admit. Patient has AKI as well.  Final Clinical Impressions(s) / ED Diagnoses   Final diagnoses:  Unstable angina ([place])  AKI (acute kidney injury) [facility])    ED Discharge Orders    None       Drenda Freeze, MD 06/25/17 [phone]

## 2017-06-25 NOTE — ED Notes (Signed)
ED Provider at bedside. 

## 2017-06-25 NOTE — ED Triage Notes (Signed)
Pt arrives from home with gcems for cp that started at 4am. Pt took 3 sl nitro while at home decided to drive to ED and made it almost halfway and pulled over to call 911 due to pain. Pt refused asa with ems. Pt arrives with 18 G L A/C. Pt currently still having pain. Pt given 1 SL nitro PTA with ems.

## 2017-06-25 NOTE — ED Notes (Signed)
Cards at bedside

## 2017-06-25 NOTE — Progress Notes (Signed)
ANTICOAGULATION CONSULT NOTE - Initial Consult  Pharmacy Consult for Heparin Indication: chest pain/ACS  Allergies  Allergen Reactions  . Chlorhexidine Gluconate Itching  . Ace Inhibitors Cough  . Dilaudid [Hydromorphone Hcl] Itching    Patient Measurements: IBW 59.3 kg Weight: 154 lb (69.9 kg) Heparin Dosing Weigh: 69.9 kg  Vital Signs: Temp: 98.7 F (37.1 C) (05/25 1142) Temp Source: Oral (05/25 1142) BP: 147/69 (05/25 1145) Pulse Rate: 87 (05/25 1145)  Labs: Recent Labs    06/25/17 1143  HGB 9.6*  HCT 31.0*  PLT PENDING    CrCl cannot be calculated (Patient's most recent lab result is older than the maximum 21 days allowed.).   Medical History: Past Medical History:  Diagnosis Date  . Adrenal tumor 08/2007   surgery  . Cancer of right breast [facility])    s/p lumpectomy, XRT  . Carotid artery disease (HCC)    s/p bilateral CEA  . Hepatitis C    "got treatment for it" (01/29/2016)  . HFrEF (heart failure with reduced ejection fraction) ([place])   . High cholesterol    takes Lopid daily  . History of UTI    takes Diflucan dail--pt states no uti in a couple of yrs though  . Hypertension    takes Losartan,Amlodipine,HCTZ,and Atenolol daily and Clatarpres  . Ischemic cardiomyopathy    a. 11/2016 s/p MDT BiV ICD w/ his bundle pacing.  . Joint pain    legs  . Renal artery stenosis (HCC)    Right renal artery stent 05/31/06  . Stroke [facility]) Jan. 7, 2014  . Tobacco abuse   . Type II diabetes mellitus (HCC)     Assessment: CC/HPI: CP, tightness, SOB, notes increased weight and edema  PMH: 3VCAD medically managed, heart failure s/p ICD, DM, adrenal tumor, R breast cancer, carotid artery DZ s/p B CEA, PAD/PVD, Hep C treated, HFrEF, HLD, h/o UTI, HTN, ICM, joint pain, RAS, CVA, tobacco, CKD3, chronic ITP  Anticoag: IV heparin for CP/ACS. No anticoag PTA. Hgb 9.6. Platelets pending with h/o ITP noted.  Goal of Therapy:  Heparin level 0.3-0.7 units/ml Monitor  platelets by anticoagulation protocol: Yes   Plan:  Heparin 4000 unit bolus Heparin infusion at 1000 units/hr Heparin level in 6-8 hrs Daily HL and CBC  Haidan Nhan S. Alford Highland, PharmD, BCPS Clinical Staff Pharmacist Pager [phone]  Eilene Ghazi Stillinger 06-05-202019,12:32 PM

## 2017-06-25 NOTE — ED Notes (Signed)
All together patient took 4 SL Nitro

## 2017-06-25 NOTE — Progress Notes (Deleted)
ANTICOAGULATION CONSULT NOTE - Initial Consult  Pharmacy Consult for heparin Indication: chest pain/ACS  Allergies  Allergen Reactions  . Chlorhexidine Gluconate Itching  . Ace Inhibitors Cough  . Dilaudid [Hydromorphone Hcl] Itching    Patient Measurements: Weight: 154 lb (69.9 kg)   Vital Signs: Temp: 98.7 F (37.1 C) (05/25 1142) Temp Source: Oral (05/25 1142) BP: 141/70 (05/25 1515) Pulse Rate: 86 (05/25 1515)  Labs: Recent Labs    06/25/17 1143  HGB 9.6*  HCT 31.0*  PLT 116*  CREATININE 3.45*    Estimated Creatinine Clearance: 14.2 mL/min (A) (by C-G formula based on SCr of 3.45 mg/dL (H)).   Medical History: Past Medical History:  Diagnosis Date  . Adrenal tumor 08/2007   surgery  . Cancer of right breast [facility])    s/p lumpectomy, XRT  . Carotid artery disease (HCC)    s/p bilateral CEA  . Hepatitis C    "got treatment for it" (01/29/2016)  . HFrEF (heart failure with reduced ejection fraction) ([place])   . High cholesterol    takes Lopid daily  . History of UTI    takes Diflucan dail--pt states no uti in a couple of yrs though  . Hypertension    takes Losartan,Amlodipine,HCTZ,and Atenolol daily and Clatarpres  . Ischemic cardiomyopathy    a. 11/2016 s/p MDT BiV ICD w/ his bundle pacing.  . Joint pain    legs  . Renal artery stenosis (HCC)    Right renal artery stent 05/31/06  . Stroke [facility]) Jan. 7, 2014  . Tobacco abuse   . Type II diabetes mellitus [facility])       Assessment: 72 yo female with Chest pain/ACS to start on a heparin drip.   Goal of Therapy:  Heparin level 0.3-0.7 Monitor platelets by anticoagulation protocol: Yes   Plan:  Heparin 4000 units x 1 Heparin 850 units/hr HL in 8 hours HL and CBC daily while on heparin Monitor for S/SX of bleeding  Sharolyn Weber A Emer Onnen 10/06/2017,4:39 PM

## 2017-06-25 NOTE — Progress Notes (Signed)
ANTICOAGULATION CONSULT NOTE - Follow-up Consult  Pharmacy Consult for Heparin Indication: chest pain/ACS  Allergies  Allergen Reactions  . Chlorhexidine Gluconate Itching  . Ace Inhibitors Cough  . Dilaudid [Hydromorphone Hcl] Itching    Patient Measurements: IBW 59.3 kg Height: 5\' 6"  (167.6 cm) Weight: 159 lb 2.8 oz (72.2 kg) IBW/kg (Calculated) : 59.3 Heparin Dosing Weigh: 69.9 kg  Vital Signs: Temp: 99.3 F (37.4 C) (05/25 1920) Temp Source: Oral (05/25 1920) BP: 147/68 (05/25 2215) Pulse Rate: 89 (05/25 2215)  Labs: Recent Labs    06/25/17 1143 06/25/17 1956  HGB 9.6*  --   HCT 31.0*  --   PLT 116*  --   HEPARINUNFRC  --  0.84*  CREATININE 3.45*  --   TROPONINI  --  2.72*    Estimated Creatinine Clearance: 15.4 mL/min (A) (by C-G formula based on SCr of 3.45 mg/dL (H)).   Medical History: Past Medical History:  Diagnosis Date  . Adrenal tumor 08/2007   surgery  . Cancer of right breast [facility])    s/p lumpectomy, XRT  . Carotid artery disease (HCC)    s/p bilateral CEA  . Hepatitis C    "got treatment for it" (01/29/2016)  . HFrEF (heart failure with reduced ejection fraction) ([place])   . High cholesterol    takes Lopid daily  . History of UTI    takes Diflucan dail--pt states no uti in a couple of yrs though  . Hypertension    takes Losartan,Amlodipine,HCTZ,and Atenolol daily and Clatarpres  . Ischemic cardiomyopathy    a. 11/2016 s/p MDT BiV ICD w/ his bundle pacing.  . Joint pain    legs  . Renal artery stenosis (HCC)    Right renal artery stent 05/31/06  . Stroke [facility]) Jan. 7, 2014  . Tobacco abuse   . Type II diabetes mellitus (HCC)     Assessment: CC/HPI: CP, tightness, SOB, notes increased weight and edema  PMH: 3VCAD medically managed, heart failure s/p ICD, DM, adrenal tumor, R breast cancer, carotid artery DZ s/p B CEA, PAD/PVD, Hep C treated, HFrEF, HLD, h/o UTI, HTN, ICM, joint pain, RAS, CVA, tobacco, CKD3, chronic ITP  Anticoag:  IV heparin for CP/ACS. No anticoag PTA. Hgb 9.6. Platelets pending with h/o ITP noted.  Heparin level 0.84   Goal of Therapy:  Heparin level 0.3-0.7 units/ml Monitor platelets by anticoagulation protocol: Yes   Plan:  Decrease Heparin infusion to 850 units/hr Heparin level in 8 hrs Daily HL and CBC   Inocencia Murtaugh A. Levada Dy, PharmD, [place] Pager: [phone]  11-24-202019,10:43 PM

## 2017-06-25 NOTE — Progress Notes (Addendum)
CRITICAL VALUE ALERT  Critical Value: Trop 2.72  Date & Time Notied:  06/25/17  Provider Notified: Dr. Jonah Blue; also notified of 12 lead that was ordered (no different from previous)  Orders Received/Actions taken: no new orders at this time

## 2017-06-26 DIAGNOSIS — I5021 Acute systolic (congestive) heart failure: Secondary | ICD-10-CM

## 2017-06-26 LAB — BASIC METABOLIC PANEL
Anion gap: 11 (ref 5–15)
BUN: 48 mg/dL — ABNORMAL HIGH (ref 6–20)
CALCIUM: 9.5 mg/dL (ref 8.9–10.3)
CO2: 16 mmol/L — AB (ref 22–32)
CREATININE: 3 mg/dL — AB (ref 0.44–1.00)
Chloride: 109 mmol/L (ref 101–111)
GFR calc non Af Amer: 15 mL/min — ABNORMAL LOW (ref >60)
GFR, EST AFRICAN AMERICAN: 17 mL/min — AB (ref >60)
GLUCOSE: 108 mg/dL — AB (ref 65–99)
Potassium: 2.9 mmol/L — ABNORMAL LOW (ref 3.5–5.1)
Sodium: 136 mmol/L (ref 135–145)

## 2017-06-26 LAB — CBC
HEMATOCRIT: 27.3 % — AB (ref 36.0–46.0)
Hemoglobin: 8.5 g/dL — ABNORMAL LOW (ref 12.0–15.0)
MCH: 28.2 pg (ref 26.0–34.0)
MCHC: 31.1 g/dL (ref 30.0–36.0)
MCV: 90.7 fL (ref 78.0–100.0)
Platelets: 93 10*3/uL — ABNORMAL LOW (ref 150–400)
RBC: 3.01 MIL/uL — ABNORMAL LOW (ref 3.87–5.11)
RDW: 16.4 % — AB (ref 11.5–15.5)
WBC: 5.8 10*3/uL (ref 4.0–10.5)

## 2017-06-26 LAB — GLUCOSE, CAPILLARY
GLUCOSE-CAPILLARY: 124 mg/dL — AB (ref 65–99)
GLUCOSE-CAPILLARY: 116 mg/dL — AB (ref 65–99)
GLUCOSE-CAPILLARY: 126 mg/dL — AB (ref 65–99)
Glucose-Capillary: 135 mg/dL — ABNORMAL HIGH (ref 65–99)

## 2017-06-26 LAB — MRSA PCR SCREENING: MRSA by PCR: NEGATIVE

## 2017-06-26 LAB — LIPID PANEL
Cholesterol: 140 mg/dL (ref 0–200)
HDL: 30 mg/dL — ABNORMAL LOW (ref >40)
LDL Cholesterol: 70 mg/dL (ref 0–99)
Total CHOL/HDL Ratio: 4.7 RATIO
Triglycerides: 198 mg/dL — ABNORMAL HIGH (ref <150)
VLDL: 40 mg/dL (ref 0–40)

## 2017-06-26 LAB — TROPONIN I
TROPONIN I: 4.19 ng/mL — AB (ref <0.03)
TROPONIN I: 4.48 ng/mL — AB (ref <0.03)

## 2017-06-26 LAB — HIV ANTIBODY (ROUTINE TESTING W REFLEX): HIV Screen 4th Generation wRfx: NONREACTIVE

## 2017-06-26 LAB — HEPARIN LEVEL (UNFRACTIONATED): HEPARIN UNFRACTIONATED: 0.39 [IU]/mL (ref 0.30–0.70)

## 2017-06-26 LAB — TSH: TSH: 1.9 u[IU]/mL (ref 0.350–4.500)

## 2017-06-26 MED ORDER — POTASSIUM CHLORIDE CRYS ER 20 MEQ PO TBCR: 40.0000 meq | EXTENDED_RELEASE_TABLET | Freq: Once | ORAL | Status: DC

## 2017-06-26 MED ORDER — SODIUM CHLORIDE 0.9 % IV SOLN
1.0000 g | INTRAVENOUS | Status: DC
  Administered 2017-06-26 – 2017-06-27 (×2): 1 g via INTRAVENOUS
  Filled 2017-06-26 (×3): qty 10

## 2017-06-26 MED ORDER — SODIUM CHLORIDE 0.9 % IV SOLN
INTRAVENOUS | Status: DC | PRN
  Administered 2017-06-26: 06:00:00 via INTRAVENOUS

## 2017-06-26 MED ORDER — POTASSIUM CHLORIDE CRYS ER 20 MEQ PO TBCR
20.0000 meq | EXTENDED_RELEASE_TABLET | Freq: Once | ORAL | Status: AC
  Administered 2017-06-26: 20 meq via ORAL
  Filled 2017-06-26: qty 1

## 2017-06-26 MED ORDER — POTASSIUM CHLORIDE 10 MEQ/100ML IV SOLN
10.0000 meq | INTRAVENOUS | Status: DC
  Administered 2017-06-26: 10 meq via INTRAVENOUS
  Filled 2017-06-26 (×2): qty 100

## 2017-06-26 MED ORDER — POTASSIUM CHLORIDE 10 MEQ/100ML IV SOLN: 10.0000 meq | INTRAVENOUS | Status: DC

## 2017-06-26 NOTE — Progress Notes (Signed)
ANTICOAGULATION CONSULT NOTE - Follow-up Consult  Pharmacy Consult for Heparin Indication: chest pain/ACS  Allergies  Allergen Reactions  . Chlorhexidine Gluconate Itching  . Ace Inhibitors Cough  . Dilaudid [Hydromorphone Hcl] Itching    Patient Measurements: IBW 59.3 kg Height: 5\' 6"  (167.6 cm) Weight: 161 lb 2.5 oz (73.1 kg) IBW/kg (Calculated) : 59.3 Heparin Dosing Weigh: 69.9 kg  Vital Signs: Temp: 98.9 F (37.2 C) (05/26 0752) Temp Source: Oral (05/26 0752) BP: 127/68 (05/26 0700) Pulse Rate: 81 (05/26 0700)  Labs: Recent Labs    06/25/17 1143 06/25/17 1956 06/26/17 0255 06/26/17 0702  HGB 9.6*  --  8.5*  --   HCT 31.0*  --  27.3*  --   PLT 116*  --  93*  --   HEPARINUNFRC  --  0.84*  --  0.39  CREATININE 3.45*  --  3.00*  --   TROPONINI  --  2.72* 4.19* 4.48*    Estimated Creatinine Clearance: 17.9 mL/min (A) (by C-G formula based on SCr of 3 mg/dL (H)).   Medical History: Past Medical History:  Diagnosis Date  . Adrenal tumor 08/2007   surgery  . Cancer of right breast [facility])    s/p lumpectomy, XRT  . Carotid artery disease (HCC)    s/p bilateral CEA  . Hepatitis C    "got treatment for it" (01/29/2016)  . HFrEF (heart failure with reduced ejection fraction) ([place])   . High cholesterol    takes Lopid daily  . History of UTI    takes Diflucan dail--pt states no uti in a couple of yrs though  . Hypertension    takes Losartan,Amlodipine,HCTZ,and Atenolol daily and Clatarpres  . Ischemic cardiomyopathy    a. 11/2016 s/p MDT BiV ICD w/ his bundle pacing.  . Joint pain    legs  . Renal artery stenosis (HCC)    Right renal artery stent 05/31/06  . Stroke [facility]) Jan. 7, 2014  . Tobacco abuse   . Type II diabetes mellitus (HCC)     Assessment: 89 yoF admitted with CP and positive troponins. Pt with known 3vCAD with no interventional or surgical options - to treat with IV heparin and NTG for now. Heparin level therapeutic, CBC stable.  Goal of  Therapy:  Heparin level 0.3-0.7 units/ml Monitor platelets by anticoagulation protocol: Yes   Plan:  Continue heparin 850 units/hr F/U heparin length of therapy Daily heparin level and CBC   Arrie Senate, PharmD, BCPS PGY-2 Cardiology Pharmacy Resident Pager: [phone] 06/26/2017

## 2017-06-26 NOTE — Progress Notes (Signed)
Son reports patient is coughing up brown/tan sputum, which is new from yesterday. He asked that I notify doctor to start an antibiotic. He also asked if Dr. Vaughan Browner could come see her and give them the results of her PET scan. Patient saw him in the office. I called Dr. Vaughan Browner and he said he would read the scan and talk with them in the morning. I let patient and son know what he said. I spoke with Cecilie Kicks, NP with cardiology who started her on Rocephin. Will continue to monitor.

## 2017-06-26 NOTE — Progress Notes (Signed)
On call MD notified regarding patients AM lab work with K+ result of 2.9. Patient noted to have received two doses of 80mg  IV lasix. Orders obtained for PO and IV K+ replacement. PO K+ administered as ordered and tolerated well. IV K+ initiated however, patient did not tolerate infusion due to infusion causing burning sensation up patients arm. Attempts made to reduce rate and add NS carrier to assist with dilution; however rate reduced to 45mL/hr before infusion stopped burning (which would have taken 8 hours to complete 6mEq replacement). On call MD updated and orders received to discontinue K+ IV infusion but replace another 32mEq PO. Orders received and carried out. Patient provided second PO K+ tablet without issue.

## 2017-06-26 NOTE — Progress Notes (Signed)
Pt is now coughing up yellow sputum, one temp of 99, WBC is normal.  Discussed with DR. Tilley and reviewed CXR,  Will send sputum culture and add rocephin.

## 2017-06-26 NOTE — Progress Notes (Signed)
Son reports that the patient wishes to be a DNR/DNI and doesn't want dialysis. RN relayed message to MD.

## 2017-06-26 NOTE — Significant Event (Signed)
I was notified by the nurse that the patient spoke with her family and wishes to change her code status to DNR. I came and discussed this with the patient. She expressed understanding of the implications of this change.

## 2017-06-26 NOTE — Progress Notes (Signed)
Progress Note  Patient Name: Kristen Bradley Date of Encounter: 06/26/2017  Primary Cardiologist: [name], MD   Subjective   Chest pain resolved.   Inpatient Medications    Scheduled Meds: . amLODipine  10 mg Oral Daily  . aspirin EC  81 mg Oral Daily  . cloNIDine  0.1 mg Oral BID  . fenofibrate  54 mg Oral Daily  . furosemide  80 mg Intravenous Q12H  . Glecaprevir-Pibrentasvir  3 tablet Oral Daily  . insulin aspart  0-5 Units Subcutaneous QHS  . insulin aspart  0-9 Units Subcutaneous TID WC  . mouth rinse  15 mL Mouth Rinse BID  . metoprolol succinate  25 mg Oral Daily  . pneumococcal 23 valent vaccine  0.5 mL Intramuscular Tomorrow-1000  . rosuvastatin  20 mg Oral Daily   Continuous Infusions: . sodium chloride Stopped (06/26/17 0601)  . heparin 850 Units/hr (06/26/17 0000)  . nitroGLYCERIN 100 mcg/min (06/26/17 0233)   PRN Meds: sodium chloride, acetaminophen, acetaminophen, nitroGLYCERIN, nitroGLYCERIN, ondansetron (ZOFRAN) IV   Vital Signs    Vitals:   06/26/17 0500 06/26/17 0600 06/26/17 0700 06/26/17 0752  BP: 122/62 134/63 127/68   Pulse: 87 84 81   Resp: 17 20 (!) 27   Temp:    98.9 F (37.2 C)  TempSrc:    Oral  SpO2: 97% 99% 97%   Weight: 161 lb 2.5 oz (73.1 kg)     Height:        Intake/Output Summary (Last 24 hours) at 06/26/2017 0900 Last data filed at 06/26/2017 0815 Gross per 24 hour  Intake 1052.69 ml  Output 925 ml  Net 127.69 ml   Filed Weights   06/25/17 1200 06/25/17 2000 06/26/17 0500  Weight: 154 lb (69.9 kg) 159 lb 2.8 oz (72.2 kg) 161 lb 2.5 oz (73.1 kg)    Telemetry    nsr with his bundle pacing - Personally Reviewed  ECG    none - Personally Reviewed  Physical Exam   GEN: No acute distress.   Neck: 6 cm JVD Cardiac: RRR, no murmurs, rubs, Soft S4 remains.  Respiratory: Clear to auscultation bilaterally. GI: Soft, nontender, non-distended  MS: No edema; No deformity. Neuro:  Nonfocal  Psych: Normal  affect   Labs    Chemistry Recent Labs  Lab 06/25/17 1143 06/25/17 1956 06/26/17 0255  NA 144  --  136  K 3.6  --  2.9*  CL 116*  --  109  CO2 17*  --  16*  GLUCOSE 132*  --  108*  BUN 50*  --  48*  CREATININE 3.45*  --  3.00*  CALCIUM 10.0  --  9.5  PROT 7.2 7.2  --   ALBUMIN 3.4* 3.6  --   AST 17 21  --   ALT 9* 8*  --   ALKPHOS 89 86  --   BILITOT 0.7 1.0  --   GFRNONAA 12*  --  15*  GFRAA 14*  --  17*  ANIONGAP 11  --  11     Hematology Recent Labs  Lab 06/25/17 1143 06/26/17 0255  WBC 7.7 5.8  RBC 3.38* 3.01*  HGB 9.6* 8.5*  HCT 31.0* 27.3*  MCV 91.7 90.7  MCH 28.4 28.2  MCHC 31.0 31.1  RDW 16.6* 16.4*  PLT 116* 93*    Cardiac Enzymes Recent Labs  Lab 06/25/17 1956 06/26/17 0255 06/26/17 0702  TROPONINI 2.72* 4.19* 4.48*    Recent Labs  Lab 06/25/17  1206  TROPIPOC 1.20*     BNP Recent Labs  Lab 06/25/17 1143  BNP 1,355.5*     DDimer No results for input(s): DDIMER in the last 168 hours.   Radiology    Dg Chest 2 View  Result Date: 2020/06/1717 CLINICAL DATA:  Chest tightness and short of breath EXAM: CHEST - 2 VIEW COMPARISON:  11/16/2016 FINDINGS: The heart is moderately enlarged. Left subclavian AICD device is stable. Vascular congestion. Right basilar airspace disease. No pneumothorax. No pleural effusion. IMPRESSION: Right basilar airspace disease. Followup PA and lateral chest X-ray is recommended in 3-4 weeks following trial of antibiotic therapy to ensure resolution and exclude underlying malignancy. Electronically Signed   By: Marybelle Killings M.D.   On: 02020/06/1717 12:31    Cardiac Studies   Troponin - all positive  Patient Profile     72 y.o. female admitted with acute on chronic systolic heart failure due to NSTEMI  Assessment & Plan    1. NSTEMI - her symptoms have resolved. No invasive options for her at this time based on prior cath and renal function. Will uptitrate her meds as tolerated. Continue IV heparin for today.    2. Acute on chronic systolic and diastolic heart failure - she is improved. Will continue judicious diuresis 3. Hypokalemia - the patients potassium will be repleted. 4. Acute on chronic stage 4 renal failure - her creatinine has come down from 3.4 to 3.0. We will follow.  5. Dyslipidemia - her cholesterol numbers are markedly improved from February, likely due to crestor.  For questions or updates, please contact [place] Please consult [email or web address] for contact info under Cardiology/STEMI.      Signed, Cristopher Peru, MD  06/26/2017, 9:00 AM  Patient ID: Kristen Bradley, female   DOB: [birth year], 72 y.o.   MRN: [id number]

## 2017-06-27 DIAGNOSIS — I2510 Atherosclerotic heart disease of native coronary artery without angina pectoris: Secondary | ICD-10-CM

## 2017-06-27 DIAGNOSIS — I10 Essential (primary) hypertension: Secondary | ICD-10-CM

## 2017-06-27 DIAGNOSIS — I255 Ischemic cardiomyopathy: Secondary | ICD-10-CM

## 2017-06-27 LAB — GLUCOSE, CAPILLARY
GLUCOSE-CAPILLARY: 109 mg/dL — AB (ref 65–99)
GLUCOSE-CAPILLARY: 145 mg/dL — AB (ref 65–99)
GLUCOSE-CAPILLARY: 122 mg/dL — AB (ref 65–99)
Glucose-Capillary: 106 mg/dL — ABNORMAL HIGH (ref 65–99)

## 2017-06-27 LAB — CBC
HCT: 27.8 % — ABNORMAL LOW (ref 36.0–46.0)
HEMOGLOBIN: 8.8 g/dL — AB (ref 12.0–15.0)
MCH: 28.7 pg (ref 26.0–34.0)
MCHC: 31.7 g/dL (ref 30.0–36.0)
MCV: 90.6 fL (ref 78.0–100.0)
Platelets: 94 10*3/uL — ABNORMAL LOW (ref 150–400)
RBC: 3.07 MIL/uL — ABNORMAL LOW (ref 3.87–5.11)
RDW: 16.1 % — ABNORMAL HIGH (ref 11.5–15.5)
WBC: 4.5 10*3/uL (ref 4.0–10.5)

## 2017-06-27 LAB — HEPARIN LEVEL (UNFRACTIONATED)
HEPARIN UNFRACTIONATED: 0.39 [IU]/mL (ref 0.30–0.70)
Heparin Unfractionated: 0.25 IU/mL — ABNORMAL LOW (ref 0.30–0.70)

## 2017-06-27 MED ORDER — FUROSEMIDE 80 MG PO TABS
80.0000 mg | ORAL_TABLET | Freq: Two times a day (BID) | ORAL | Status: DC
  Administered 2017-06-27 – 2017-06-28 (×3): 80 mg via ORAL
  Filled 2017-06-27 (×3): qty 1

## 2017-06-27 MED ORDER — ISOSORBIDE MONONITRATE ER 30 MG PO TB24
30.0000 mg | ORAL_TABLET | Freq: Every day | ORAL | Status: DC
  Administered 2017-06-27 – 2017-06-29 (×3): 30 mg via ORAL
  Filled 2017-06-27 (×3): qty 1

## 2017-06-27 NOTE — Plan of Care (Signed)
  Problem: Education: Goal: Knowledge of General Education information will improve Outcome: Progressing   Problem: Health Behavior/Discharge Planning: Goal: Ability to manage health-related needs will improve Outcome: Progressing   Problem: Clinical Measurements: Goal: Ability to maintain clinical measurements within normal limits will improve Outcome: Progressing Goal: Will remain free from infection Outcome: Progressing Goal: Diagnostic test results will improve Outcome: Progressing Goal: Respiratory complications will improve Outcome: Progressing Goal: Cardiovascular complication will be avoided Outcome: Progressing   Problem: Activity: Goal: Risk for activity intolerance will decrease Outcome: Progressing   Problem: Nutrition: Goal: Adequate nutrition will be maintained Outcome: Progressing   Problem: Coping: Goal: Level of anxiety will decrease Outcome: Progressing   Problem: Elimination: Goal: Will not experience complications related to bowel motility Outcome: Progressing Goal: Will not experience complications related to urinary retention Outcome: Progressing   Problem: Pain Managment: Goal: General experience of comfort will improve Outcome: Progressing   Problem: Safety: Goal: Ability to remain free from injury will improve Outcome: Progressing   Problem: Skin Integrity: Goal: Risk for impaired skin integrity will decrease Outcome: Progressing   Problem: Education: Goal: Understanding of cardiac disease, CV risk reduction, and recovery process will improve Outcome: Progressing   Problem: Activity: Goal: Ability to tolerate increased activity will improve Outcome: Progressing   Problem: Cardiac: Goal: Ability to achieve and maintain adequate cardiovascular perfusion will improve Outcome: Progressing   Problem: Health Behavior/Discharge Planning: Goal: Ability to safely manage health-related needs after discharge will improve Outcome:  Progressing   Problem: Education: Goal: Ability to demonstrate management of disease process will improve Outcome: Progressing Goal: Ability to verbalize understanding of medication therapies will improve Outcome: Progressing   Problem: Activity: Goal: Capacity to carry out activities will improve Outcome: Progressing   Problem: Cardiac: Goal: Ability to achieve and maintain adequate cardiopulmonary perfusion will improve Outcome: Progressing

## 2017-06-27 NOTE — Progress Notes (Signed)
ANTICOAGULATION CONSULT NOTE - Follow-up Consult  Pharmacy Consult for Heparin Indication: chest pain/ACS  Allergies  Allergen Reactions  . Chlorhexidine Gluconate Itching  . Ace Inhibitors Cough  . Dilaudid [Hydromorphone Hcl] Itching    Patient Measurements: IBW 59.3 kg Height: 5\' 6"  (167.6 cm) Weight: 161 lb 2.5 oz (73.1 kg) IBW/kg (Calculated) : 59.3 Heparin Dosing Weigh: 69.9 kg  Vital Signs: Temp: 98.5 F (36.9 C) (05/27 0914) Temp Source: Oral (05/27 0914) BP: 125/61 (05/27 0900) Pulse Rate: 61 (05/27 0900)  Labs: Recent Labs    06/25/17 1143  06/25/17 1956 06/26/17 0255 06/26/17 0702 06/27/17 0219 06/27/17 1121  HGB 9.6*  --   --  8.5*  --  8.8*  --   HCT 31.0*  --   --  27.3*  --  27.8*  --   PLT 116*  --   --  93*  --  94*  --   HEPARINUNFRC  --    < > 0.84*  --  0.39 0.25* 0.39  CREATININE 3.45*  --   --  3.00*  --   --   --   TROPONINI  --   --  2.72* 4.19* 4.48*  --   --    < > = values in this interval not displayed.    Estimated Creatinine Clearance: 17.9 mL/min (A) (by C-G formula based on SCr of 3 mg/dL (H)).   Medical History: Past Medical History:  Diagnosis Date  . Adrenal tumor 08/2007   surgery  . Cancer of right breast [facility])    s/p lumpectomy, XRT  . Carotid artery disease (HCC)    s/p bilateral CEA  . Hepatitis C    "got treatment for it" (01/29/2016)  . HFrEF (heart failure with reduced ejection fraction) ([place])   . High cholesterol    takes Lopid daily  . History of UTI    takes Diflucan dail--pt states no uti in a couple of yrs though  . Hypertension    takes Losartan,Amlodipine,HCTZ,and Atenolol daily and Clatarpres  . Ischemic cardiomyopathy    a. 11/2016 s/p MDT BiV ICD w/ his bundle pacing.  . Joint pain    legs  . Renal artery stenosis (HCC)    Right renal artery stent 05/31/06  . Stroke [facility]) Jan. 7, 2014  . Tobacco abuse   . Type II diabetes mellitus (HCC)     Assessment: 41 yoF admitted with CP and positive  troponins. Pt with known 3vCAD with no interventional or surgical options - to treat medically with IV heparin for now. Heparin therapeutic, CBC stable.  Goal of Therapy:  Heparin level 0.3-0.7 units/ml Monitor platelets by anticoagulation protocol: Yes   Plan:  Continue heparin 950 units/hr F/U heparin length of therapy - would stop after 48-72 hr Daily heparin level and CBC   Arrie Senate, PharmD, BCPS PGY-2 Cardiology Pharmacy Resident Pager: [phone] 06/27/2017

## 2017-06-27 NOTE — Progress Notes (Signed)
ANTICOAGULATION CONSULT NOTE - Follow-up Consult  Pharmacy Consult for Heparin Indication: chest pain/ACS  Allergies  Allergen Reactions  . Chlorhexidine Gluconate Itching  . Ace Inhibitors Cough  . Dilaudid [Hydromorphone Hcl] Itching    Patient Measurements: IBW 59.3 kg Height: 5\' 6"  (167.6 cm) Weight: 161 lb 2.5 oz (73.1 kg) IBW/kg (Calculated) : 59.3 Heparin Dosing Weigh: 72.2 kg  Vital Signs: Temp: 98.6 F (37 C) (05/26 2335) Temp Source: Oral (05/26 2335) BP: 132/66 (05/27 0200) Pulse Rate: 74 (05/27 0200)  Labs: Recent Labs    06/25/17 1143 06/25/17 1956 06/26/17 0255 06/26/17 0702 06/27/17 0219  HGB 9.6*  --  8.5*  --  8.8*  HCT 31.0*  --  27.3*  --  27.8*  PLT 116*  --  93*  --  94*  HEPARINUNFRC  --  0.84*  --  0.39 0.25*  CREATININE 3.45*  --  3.00*  --   --   TROPONINI  --  2.72* 4.19* 4.48*  --     Estimated Creatinine Clearance: 17.9 mL/min (A) (by C-G formula based on SCr of 3 mg/dL (H)).   Medical History: Past Medical History:  Diagnosis Date  . Adrenal tumor 08/2007   surgery  . Cancer of right breast [facility])    s/p lumpectomy, XRT  . Carotid artery disease (HCC)    s/p bilateral CEA  . Hepatitis C    "got treatment for it" (01/29/2016)  . HFrEF (heart failure with reduced ejection fraction) ([place])   . High cholesterol    takes Lopid daily  . History of UTI    takes Diflucan dail--pt states no uti in a couple of yrs though  . Hypertension    takes Losartan,Amlodipine,HCTZ,and Atenolol daily and Clatarpres  . Ischemic cardiomyopathy    a. 11/2016 s/p MDT BiV ICD w/ his bundle pacing.  . Joint pain    legs  . Renal artery stenosis (HCC)    Right renal artery stent 05/31/06  . Stroke [facility]) Jan. 7, 2014  . Tobacco abuse   . Type II diabetes mellitus (HCC)     Assessment: 41 yoF admitted with CP and positive troponins. Pt with known 3vCAD with no interventional or surgical options - to treat with IV heparin and NTG for now. Heparin  level SUBtherapeutic this morning at 0.25, CBC low but stable. Per RN, no problems with bleeding or interruptions with infusion.  Goal of Therapy:  Heparin level 0.3-0.7 units/ml Monitor platelets by anticoagulation protocol: Yes   Plan:  Increase heparin to 950 units/hr F/U heparin length of therapy 8 hour and Daily heparin level and CBC   Thank you for allowing [place] to participate in this patients care.  Jens Som, PharmD Main pharmacy at: [phone] 06/27/2017 3:02 AM

## 2017-06-27 NOTE — Progress Notes (Signed)
Visited with patient and her family.  Working towards completion of Scientist, physiological.  Witnesses will return tomorrow and we will notarize.  Please page should additional assistance be needed.  Will follow through with completion of notarization on tomorrow.    06/27/17 1150  Clinical Encounter Type  Visited With Patient and family together  Visit Type Initial;Spiritual support  Spiritual Encounters  Spiritual Needs Literature Financial planner Education)

## 2017-06-27 NOTE — Evaluation (Signed)
Physical Therapy Evaluation/Discharge Patient Details Name: Kristen Bradley MRN: [id number] DOB: [birth year] Today's Date: 06/27/2017   History of Present Illness  72 yo admitted with CHF due to NSTEMI. Pt with elevated troponin but no surgical or interventional options and managed medically. PMHx: CAD, ischemic cardiomyopathy, chronic renal failure, ICD, PAD, HTN, DM, breast CA, adrenal tumor  Clinical Impression  Pt demonstrates mild impairments related to gait quality and reports that she has leg pain when ambulating longer distances. No instability noted with gait without AD. Pt educated regarding ambulating to point of pain to help improve LE circulation and decrease pain long term. Deficits noted are present at baseline and there are not acute PT needs present. Pt and family agreeable to D/C from PT services.    Follow Up Recommendations No PT follow up    Equipment Recommendations  None recommended by PT    Recommendations for Other Services       Precautions / Restrictions Precautions Precautions: Fall Restrictions Weight Bearing Restrictions: No      Mobility  Bed Mobility Overal bed mobility: Modified Independent             General bed mobility comments: Pt performed supine>sit EOB Mod I with increased time taken and HOB slightly elevated  Transfers Overall transfer level: Independent               General transfer comment: Pt independent with sit>stand and deferred use of RW  Ambulation/Gait Ambulation/Gait assistance: Supervision Ambulation Distance (Feet): 160 Feet Assistive device: None Gait Pattern/deviations: Step-through pattern Gait velocity: Mildy decreased   General Gait Details: Pt ambulates at baseline level without AD. Pt exhibits slight lean over stance leg during gait. No difficulties with balance noted. Pt states that when she walks longer distances her legs begin to hurt due to poor circulation.  Stairs            Wheelchair  Mobility    Modified Rankin (Stroke Patients Only)       Balance Overall balance assessment: No apparent balance deficits (not formally assessed)                                           Pertinent Vitals/Pain Pain Assessment: No/denies pain    Home Living Family/patient expects to be discharged to:: Private residence Living Arrangements: Spouse/significant other Available Help at Discharge: Family;Available PRN/intermittently Type of Home: House Home Access: Stairs to enter Entrance Stairs-Rails: None Entrance Stairs-Number of Steps: 4 Home Layout: One level Home Equipment: None      Prior Function Level of Independence: Independent         Comments: No AD. Able to ambulate in house and short community distances. No falls in past year     Hand Dominance        Extremity/Trunk Assessment   Upper Extremity Assessment Upper Extremity Assessment: Overall WFL for tasks assessed    Lower Extremity Assessment Lower Extremity Assessment: Overall WFL for tasks assessed       Communication   Communication: No difficulties  Cognition Arousal/Alertness: Awake/alert Behavior During Therapy: WFL for tasks assessed/performed Overall Cognitive Status: Within Functional Limits for tasks assessed                                        General  Comments      Exercises     Assessment/Plan    PT Assessment Patent does not need any further PT services  PT Problem List         PT Treatment Interventions      PT Goals (Current goals can be found in the Care Plan section)  Acute Rehab PT Goals Patient Stated Goal: To go home PT Goal Formulation: With patient/family Potential to Achieve Goals: Good    Frequency     Barriers to discharge        Co-evaluation               AM-PAC PT "6 Clicks" Daily Activity  Outcome Measure Difficulty turning over in bed (including adjusting bedclothes, sheets and blankets)?:  None Difficulty moving from lying on back to sitting on the side of the bed? : None Difficulty sitting down on and standing up from a chair with arms (e.g., wheelchair, bedside commode, etc,.)?: None Help needed moving to and from a bed to chair (including a wheelchair)?: None Help needed walking in hospital room?: None Help needed climbing 3-5 steps with a railing? : A Little 6 Click Score: 23    End of Session Equipment Utilized During Treatment: Gait belt Activity Tolerance: Patient tolerated treatment well Patient left: in chair;with chair alarm set;with call bell/phone within reach;with family/visitor present   PT Visit Diagnosis: Other abnormalities of gait and mobility (R26.89)    Time: 1188-6773 PT Time Calculation (min) (ACUTE ONLY): 15 min   Charges:   PT Evaluation $PT Eval Low Complexity: 1 Low     PT G Codes:        Gabe Yezenia Fredrick, SPT  [employer] 06/27/2017, 1:41 PM

## 2017-06-27 NOTE — Progress Notes (Signed)
Progress Note  Patient Name: Kristen Bradley Date of Encounter: 06/27/2017  Primary Cardiologist: [name], MD   Subjective   Shortness of breath has improved.  She did have some mild throat and jaw pain last night.  She remains on IV heparin.  Inpatient Medications    Scheduled Meds: . amLODipine  10 mg Oral Daily  . aspirin EC  81 mg Oral Daily  . cloNIDine  0.1 mg Oral BID  . fenofibrate  54 mg Oral Daily  . furosemide  80 mg Intravenous Q12H  . Glecaprevir-Pibrentasvir  3 tablet Oral Daily  . insulin aspart  0-5 Units Subcutaneous QHS  . insulin aspart  0-9 Units Subcutaneous TID WC  . metoprolol succinate  25 mg Oral Daily  . pneumococcal 23 valent vaccine  0.5 mL Intramuscular Tomorrow-1000  . rosuvastatin  20 mg Oral Daily   Continuous Infusions: . sodium chloride Stopped (06/26/17 0601)  . cefTRIAXone (ROCEPHIN)  IV Stopped (06/26/17 1900)  . heparin 950 Units/hr (06/27/17 0322)  . nitroGLYCERIN Stopped (06/27/17 0130)   PRN Meds: sodium chloride, acetaminophen, acetaminophen, nitroGLYCERIN, ondansetron (ZOFRAN) IV   Vital Signs    Vitals:   06/27/17 0500 06/27/17 0600 06/27/17 0700 06/27/17 0730  BP: 138/62 140/62 (!) 144/62 136/61  Pulse: 78 73 74 71  Resp: 17 18 19 17   Temp:      TempSrc:      SpO2: 92% 90% 93% 96%  Weight:      Height:        Intake/Output Summary (Last 24 hours) at 06/27/2017 0758 Last data filed at 06/27/2017 0600 Gross per 24 hour  Intake 1426.22 ml  Output 3400 ml  Net -1973.78 ml   Filed Weights   06/25/17 1200 06/25/17 2000 06/26/17 0500  Weight: 154 lb (69.9 kg) 159 lb 2.8 oz (72.2 kg) 161 lb 2.5 oz (73.1 kg)    Telemetry    Atrially sensed, ventricularly paced rhythm- Personally Reviewed  ECG    Atrially sensed, ventricular paced rhythm at 75- Personally Reviewed  Physical Exam   GEN: No acute distress.   Neck: No JVD Cardiac: RRR, no murmurs, rubs, or gallops.  Respiratory: Clear to auscultation  bilaterally. GI: Soft, nontender, non-distended  MS: No edema; No deformity. Neuro:  Nonfocal  Psych: Normal affect   Labs    Chemistry Recent Labs  Lab 06/25/17 1143 06/25/17 1956 06/26/17 0255  NA 144  --  136  K 3.6  --  2.9*  CL 116*  --  109  CO2 17*  --  16*  GLUCOSE 132*  --  108*  BUN 50*  --  48*  CREATININE 3.45*  --  3.00*  CALCIUM 10.0  --  9.5  PROT 7.2 7.2  --   ALBUMIN 3.4* 3.6  --   AST 17 21  --   ALT 9* 8*  --   ALKPHOS 89 86  --   BILITOT 0.7 1.0  --   GFRNONAA 12*  --  15*  GFRAA 14*  --  17*  ANIONGAP 11  --  11     Hematology Recent Labs  Lab 06/25/17 1143 06/26/17 0255 06/27/17 0219  WBC 7.7 5.8 4.5  RBC 3.38* 3.01* 3.07*  HGB 9.6* 8.5* 8.8*  HCT 31.0* 27.3* 27.8*  MCV 91.7 90.7 90.6  MCH 28.4 28.2 28.7  MCHC 31.0 31.1 31.7  RDW 16.6* 16.4* 16.1*  PLT 116* 93* 94*    Cardiac Enzymes Recent Labs  Lab 06/25/17 1956 06/26/17 0255 06/26/17 0702  TROPONINI 2.72* 4.19* 4.48*    Recent Labs  Lab 06/25/17 1206  TROPIPOC 1.20*     BNP Recent Labs  Lab 06/25/17 1143  BNP 1,355.5*     DDimer No results for input(s): DDIMER in the last 168 hours.   Radiology    Dg Chest 2 View  Result Date: Feb 07, 202019 CLINICAL DATA:  Chest tightness and short of breath EXAM: CHEST - 2 VIEW COMPARISON:  11/16/2016 FINDINGS: The heart is moderately enlarged. Left subclavian AICD device is stable. Vascular congestion. Right basilar airspace disease. No pneumothorax. No pleural effusion. IMPRESSION: Right basilar airspace disease. Followup PA and lateral chest X-ray is recommended in 3-4 weeks following trial of antibiotic therapy to ensure resolution and exclude underlying malignancy. Electronically Signed   By: Marybelle Killings M.D.   On: 0Feb 07, 202019 12:31    Cardiac Studies   None performed  Patient Profile     72 y.o. female with a history of diffuse CAD by cath thought not to be revascularizable because of diffuse calcification.  She does  have ischemic cardia myopathy which improved with biventricular pacing/ICD placed by Dr. Lovena Le in October.  Her EF improved to the 50 to 55% range.  She was admitted with unstable angina and heart failure.  Her enzymes are minimally positive and her BNP was elevated at 1300.  Apparently she has decided that she wishes to be a "DNR".  Assessment & Plan    1: Coronary artery disease- history of CAD by cath with three-vessel calcified disease not amenable to PCI or bypass grafting.  She was admitted with unstable angina and had medically positive enzymes.  I am going to begin her on a low-dose long-acting oral nitrate in addition to her other medications.  2: Ischemic hernia myopathy- her EF was initially depressed but improved significantly with Bi V- ICD up to the 50 to 55% range.  She was admitted with congestive heart failure.  Her BNP was 1300.  She has been diuresed proximately 2 L with 80 mg of Lasix IV twice daily which we will transition to p.o.  Her renal function however at baseline was 1.77 which increased to 3.452 days ago and slowly came down to 3.0 yesterday.  She is feeling clinically improved and appears dry on exam.  3: Acute on chronic renal failure: Baseline creatinine was 1.778 months ago, currently 3.0 down from 3.45.  We will transition from IV to p.o. diuretics.  4: Essential hypertension- stable on current medications  The patient is a DNR.  We will change her IV to oral diuretics, and I am going to transfer her to a telemetry bed.  We will add low-dose long-acting oral nitrates.  I will get physical therapy to work with her.  She apparently has a PET scan which will be discussed by her pulmonologist.  We may want to address palliative care.  For questions or updates, please contact [place] Please consult [email or web address] for contact info under Cardiology/STEMI.      Signed, Quay Burow, MD  06/27/2017, 7:58 AM

## 2017-06-27 NOTE — Progress Notes (Addendum)
I was asked to update the patient and family on the results of the PET scan that she got recently for evaluation of lung nodule.  The RLL lung nodule has low uptake but is growing since 2015. This could still be slow growing cancer. She is not a candidate for surgery given her co morbidiites of CAD, CKD and by preference of the family. We will continue to monitor lung nodule in clinic.  I advised them that she may need a colonoscope as outpatient to evaluate the uptake in the abdomen. We will follow up in clinic  Marshell Garfinkel MD [facility] 06/27/2017, 4:29 PM

## 2017-06-28 ENCOUNTER — Ambulatory Visit: Payer: Medicare HMO | Admitting: Pulmonary Disease

## 2017-06-28 DIAGNOSIS — I5043 Acute on chronic combined systolic (congestive) and diastolic (congestive) heart failure: Secondary | ICD-10-CM

## 2017-06-28 DIAGNOSIS — I2583 Coronary atherosclerosis due to lipid rich plaque: Secondary | ICD-10-CM

## 2017-06-28 DIAGNOSIS — I420 Dilated cardiomyopathy: Secondary | ICD-10-CM

## 2017-06-28 LAB — BASIC METABOLIC PANEL
ANION GAP: 11 (ref 5–15)
BUN: 55 mg/dL — ABNORMAL HIGH (ref 6–20)
CALCIUM: 10.2 mg/dL (ref 8.9–10.3)
CO2: 19 mmol/L — AB (ref 22–32)
Chloride: 107 mmol/L (ref 101–111)
Creatinine, Ser: 3.35 mg/dL — ABNORMAL HIGH (ref 0.44–1.00)
GFR calc Af Amer: 15 mL/min — ABNORMAL LOW (ref >60)
GFR, EST NON AFRICAN AMERICAN: 13 mL/min — AB (ref >60)
GLUCOSE: 123 mg/dL — AB (ref 65–99)
Potassium: 3.8 mmol/L (ref 3.5–5.1)
Sodium: 137 mmol/L (ref 135–145)

## 2017-06-28 LAB — CBC
HCT: 29.2 % — ABNORMAL LOW (ref 36.0–46.0)
HEMOGLOBIN: 9.2 g/dL — AB (ref 12.0–15.0)
MCH: 28.1 pg (ref 26.0–34.0)
MCHC: 31.5 g/dL (ref 30.0–36.0)
MCV: 89.3 fL (ref 78.0–100.0)
PLATELETS: 96 10*3/uL — AB (ref 150–400)
RBC: 3.27 MIL/uL — AB (ref 3.87–5.11)
RDW: 15.9 % — ABNORMAL HIGH (ref 11.5–15.5)
WBC: 4.6 10*3/uL (ref 4.0–10.5)

## 2017-06-28 LAB — HEPARIN LEVEL (UNFRACTIONATED): Heparin Unfractionated: 0.41 IU/mL (ref 0.30–0.70)

## 2017-06-28 LAB — GLUCOSE, CAPILLARY
GLUCOSE-CAPILLARY: 118 mg/dL — AB (ref 65–99)
GLUCOSE-CAPILLARY: 140 mg/dL — AB (ref 65–99)
GLUCOSE-CAPILLARY: 116 mg/dL — AB (ref 65–99)
Glucose-Capillary: 111 mg/dL — ABNORMAL HIGH (ref 65–99)
Glucose-Capillary: 109 mg/dL — ABNORMAL HIGH (ref 65–99)

## 2017-06-28 MED ORDER — ALUM & MAG HYDROXIDE-SIMETH 200-200-20 MG/5ML PO SUSP
30.0000 mL | ORAL | Status: DC | PRN
  Administered 2017-06-29: 30 mL via ORAL
  Filled 2017-06-28: qty 30

## 2017-06-28 MED ORDER — ROSUVASTATIN CALCIUM 10 MG PO TABS
10.0000 mg | ORAL_TABLET | Freq: Every day | ORAL | Status: DC
  Administered 2017-06-29: 10 mg via ORAL
  Filled 2017-06-28: qty 1

## 2017-06-28 MED ORDER — HEPARIN SODIUM (PORCINE) 5000 UNIT/ML IJ SOLN
5000.0000 [IU] | Freq: Three times a day (TID) | INTRAMUSCULAR | Status: DC
  Administered 2017-06-28 – 2017-06-29 (×3): 5000 [IU] via SUBCUTANEOUS
  Filled 2017-06-28 (×3): qty 1

## 2017-06-28 NOTE — Progress Notes (Signed)
Alert and oriented x 4 female admitted to unilt. Son with patient. Patient independent with ambulation. Patient makes no requests at this time.  Patient ambulated to bathroom for voiding. No distress noted. Call bell at side

## 2017-06-28 NOTE — Progress Notes (Addendum)
ANTICOAGULATION CONSULT NOTE - Follow-up Consult  Pharmacy Consult for Heparin Indication: chest pain/ACS  Allergies  Allergen Reactions  . Chlorhexidine Gluconate Itching  . Ace Inhibitors Cough  . Dilaudid [Hydromorphone Hcl] Itching    Patient Measurements: IBW 59.3 kg Height: 5\' 6"  (167.6 cm) Weight: 150 lb 8 oz (68.3 kg) IBW/kg (Calculated) : 59.3 Heparin Dosing Weigh: 69.9 kg  Vital Signs: Temp: 98.2 F (36.8 C) (05/28 1558) Temp Source: Oral (05/28 1558) BP: 124/65 (05/28 1558) Pulse Rate: 73 (05/28 1558)  Labs: Recent Labs    06/25/17 1956  06/26/17 0255 06/26/17 0702 06/27/17 0219 06/27/17 1121 06/28/17 0323  HGB  --    < > 8.5*  --  8.8*  --  9.2*  HCT  --   --  27.3*  --  27.8*  --  29.2*  PLT  --   --  93*  --  94*  --  96*  HEPARINUNFRC 0.84*  --   --  0.39 0.25* 0.39 0.41  CREATININE  --   --  3.00*  --   --   --  3.35*  TROPONINI 2.72*  --  4.19* 4.48*  --   --   --    < > = values in this interval not displayed.    Estimated Creatinine Clearance: 14.6 mL/min (A) (by C-G formula based on SCr of 3.35 mg/dL (H)).   Medical History: Past Medical History:  Diagnosis Date  . Adrenal tumor 08/2007   surgery  . Cancer of right breast [facility])    s/p lumpectomy, XRT  . Carotid artery disease (HCC)    s/p bilateral CEA  . Hepatitis C    "got treatment for it" (01/29/2016)  . HFrEF (heart failure with reduced ejection fraction) ([place])   . High cholesterol    takes Lopid daily  . History of UTI    takes Diflucan dail--pt states no uti in a couple of yrs though  . Hypertension    takes Losartan,Amlodipine,HCTZ,and Atenolol daily and Clatarpres  . Ischemic cardiomyopathy    a. 11/2016 s/p MDT BiV ICD w/ his bundle pacing.  . Joint pain    legs  . Renal artery stenosis (HCC)    Right renal artery stent 05/31/06  . Stroke [facility]) Jan. 7, 2014  . Tobacco abuse   . Type II diabetes mellitus (HCC)     Assessment: 20 yoF admitted with CP and positive  troponins. Pt with known 3vCAD with no interventional or surgical options - to treat medically with IV heparin for now>> consult changed to dose heparin for DVT prophylaxis. CBC stable.   Plan:  Start heparin 5000 units three times daily starting tonight Discontinue heparin infusion  Discussed with cardiology: given current renal function, fenofibrate is contraindicated and max dose of crestor is 10 mg. Adjustments were made to reflect this.  Doylene Canard, PharmD Clinical Pharmacist  Pager: [phone] Phone: [phone] 06/28/2017

## 2017-06-28 NOTE — Progress Notes (Addendum)
Progress Note  Patient Name: Kristen Bradley Date of Encounter: 06/28/2017  Primary Cardiologist: [name], MD   Subjective   Denies any chest pain or SOB  Inpatient Medications    Scheduled Meds: . amLODipine  10 mg Oral Daily  . aspirin EC  81 mg Oral Daily  . cloNIDine  0.1 mg Oral BID  . fenofibrate  54 mg Oral Daily  . furosemide  80 mg Oral BID  . Glecaprevir-Pibrentasvir  3 tablet Oral Daily  . insulin aspart  0-5 Units Subcutaneous QHS  . insulin aspart  0-9 Units Subcutaneous TID WC  . isosorbide mononitrate  30 mg Oral Daily  . metoprolol succinate  25 mg Oral Daily  . pneumococcal 23 valent vaccine  0.5 mL Intramuscular Tomorrow-1000  . rosuvastatin  20 mg Oral Daily   Continuous Infusions: . sodium chloride Stopped (06/26/17 0601)  . cefTRIAXone (ROCEPHIN)  IV Stopped (06/27/17 1807)  . heparin 950 Units/hr (06/28/17 0900)  . nitroGLYCERIN Stopped (06/27/17 0130)   PRN Meds: sodium chloride, acetaminophen, acetaminophen, alum & mag hydroxide-simeth, nitroGLYCERIN, ondansetron (ZOFRAN) IV   Vital Signs    Vitals:   06/28/17 0500 06/28/17 0747 06/28/17 0800 06/28/17 0912  BP: 134/66  134/69 132/63  Pulse: 71  80 76  Resp: (!) 24  20 18   Temp:  98.7 F (37.1 C)    TempSrc:  Oral    SpO2: 97%  96% 100%  Weight: 151 lb 0.2 oz (68.5 kg)     Height: 5\' 6"  (1.676 m)       Intake/Output Summary (Last 24 hours) at 06/28/2017 1014 Last data filed at 06/28/2017 0916 Gross per 24 hour  Intake 578.5 ml  Output 3600 ml  Net -3021.5 ml   Filed Weights   06/27/17 0600 06/27/17 1100 06/28/17 0500  Weight: 160 lb 7.9 oz (72.8 kg) 161 lb 2.5 oz (73.1 kg) 151 lb 0.2 oz (68.5 kg)    Telemetry    NSR - Personally Reviewed  ECG    No new EKG to review - Personally Reviewed  Physical Exam   GEN: No acute distress.   Neck: No JVD Cardiac: RRR, no murmurs, rubs, or gallops.  Respiratory: Clear to auscultation bilaterally. GI: Soft, nontender,  non-distended  MS: No edema; No deformity. Neuro:  Nonfocal  Psych: Normal affect   Labs    Chemistry Recent Labs  Lab 06/25/17 1143 06/25/17 1956 06/26/17 0255 06/28/17 0323  NA 144  --  136 137  K 3.6  --  2.9* 3.8  CL 116*  --  109 107  CO2 17*  --  16* 19*  GLUCOSE 132*  --  108* 123*  BUN 50*  --  48* 55*  CREATININE 3.45*  --  3.00* 3.35*  CALCIUM 10.0  --  9.5 10.2  PROT 7.2 7.2  --   --   ALBUMIN 3.4* 3.6  --   --   AST 17 21  --   --   ALT 9* 8*  --   --   ALKPHOS 89 86  --   --   BILITOT 0.7 1.0  --   --   GFRNONAA 12*  --  15* 13*  GFRAA 14*  --  17* 15*  ANIONGAP 11  --  11 11     Hematology Recent Labs  Lab 06/26/17 0255 06/27/17 0219 06/28/17 0323  WBC 5.8 4.5 4.6  RBC 3.01* 3.07* 3.27*  HGB 8.5* 8.8* 9.2*  HCT 27.3* 27.8* 29.2*  MCV 90.7 90.6 89.3  MCH 28.2 28.7 28.1  MCHC 31.1 31.7 31.5  RDW 16.4* 16.1* 15.9*  PLT 93* 94* 96*    Cardiac Enzymes Recent Labs  Lab 06/25/17 1956 06/26/17 0255 06/26/17 0702  TROPONINI 2.72* 4.19* 4.48*    Recent Labs  Lab 06/25/17 1206  TROPIPOC 1.20*     BNP Recent Labs  Lab 06/25/17 1143  BNP 1,355.5*     DDimer No results for input(s): DDIMER in the last 168 hours.   Radiology    No results found.  Cardiac Studies   none  Patient Profile     72 y.o. female with a history of diffuse CAD by cath thought not to be revascularizable because of diffuse calcification.  She does have ischemic cardia myopathy which improved with biventricular pacing/ICD placed by Dr. Lovena Le in October.  Her EF improved to the 50 to 55% range.  She was admitted with unstable angina and heart failure.  Her enzymes are minimally positive and her BNP was elevated at 1300.  Apparently she has decided that she wishes to be a "DNR".  Assessment & Plan    1: Coronary artery disease -history of CAD by cath with three-vessel calcified disease not amenable to PCI or bypass grafting.   -She was admitted with unstable  angina and had medically positive enzymes.  -she is tolerating addition of long acting nitrates with no further CP -continue ASA 81mg  daily, Imdur 30mg  daily, Toprol XL 25mg  daily and crestor 20mg  daily -transfer to tele bed -stop IV Heparin gtt and change to SQ for DVT prophylaxis  2: Acute on chronic combined systolic/diastolic CHF with Ischemic cardiomyopathy -her EF was initially depressed but improved significantly with Bi V- ICD up to the 50 to 55% range.   -She was admitted with congestive heart failure and BNP was 1300.   -She was diuresed proximately 2 L with 80 mg of Lasix IV twice daily and changed to PO -she diuresed 3.1L yesterday and is net neg 4.55L -weight is down 10lbs -Her renal function however at baseline was 1.77 which increased to 3.452 days ago and slowly came down to 3.0 on 5/26 but now back up to 3.35 -will hold Lasix as she appears dry and will allow to equilibrate  3: Acute on chronic renal failure -Baseline creatinine was 1.778 months ago -creatinine now bumped back up to 3.35 from 3 on 5/26 -Hold diuretics for now  4: Essential hypertension -BP stable at 132/79mmHg -continue amlodipine 10mg  daily, Clonidine 0.1mg  BID and Toprol XL 25mg  daily.    5. Lung nodule -She apparently has a PET scan which will be discussed by her pulmonologist.  We may want to address palliative care.  6.  CAP - on IV antibx day # 4 - will change to PO -she remains afebrile and WBC is normal at 4.6.   For questions or updates, please contact [place] Please consult [email or web address] for contact info under Cardiology/STEMI.      Signed, Fransico Him, MD  06/28/2017, 10:14 AM

## 2017-06-28 NOTE — Progress Notes (Signed)
ANTICOAGULATION CONSULT NOTE - Follow-up Consult  Pharmacy Consult for Heparin Indication: chest pain/ACS  Allergies  Allergen Reactions  . Chlorhexidine Gluconate Itching  . Ace Inhibitors Cough  . Dilaudid [Hydromorphone Hcl] Itching    Patient Measurements: IBW 59.3 kg Height: 5\' 6"  (167.6 cm) Weight: 151 lb 0.2 oz (68.5 kg) IBW/kg (Calculated) : 59.3 Heparin Dosing Weigh: 69.9 kg  Vital Signs: Temp: 98.5 F (36.9 C) (05/28 0348) Temp Source: Oral (05/28 0348) BP: 134/66 (05/28 0500) Pulse Rate: 71 (05/28 0500)  Labs: Recent Labs    06/25/17 1143  06/25/17 1956 06/26/17 0255 06/26/17 0702 06/27/17 0219 06/27/17 1121 06/28/17 0323  HGB 9.6*  --   --  8.5*  --  8.8*  --  9.2*  HCT 31.0*  --   --  27.3*  --  27.8*  --  29.2*  PLT 116*  --   --  93*  --  94*  --  96*  HEPARINUNFRC  --    < > 0.84*  --  0.39 0.25* 0.39 0.41  CREATININE 3.45*  --   --  3.00*  --   --   --  3.35*  TROPONINI  --   --  2.72* 4.19* 4.48*  --   --   --    < > = values in this interval not displayed.    Estimated Creatinine Clearance: 14.6 mL/min (A) (by C-G formula based on SCr of 3.35 mg/dL (H)).   Medical History: Past Medical History:  Diagnosis Date  . Adrenal tumor 08/2007   surgery  . Cancer of right breast [facility])    s/p lumpectomy, XRT  . Carotid artery disease (HCC)    s/p bilateral CEA  . Hepatitis C    "got treatment for it" (01/29/2016)  . HFrEF (heart failure with reduced ejection fraction) ([place])   . High cholesterol    takes Lopid daily  . History of UTI    takes Diflucan dail--pt states no uti in a couple of yrs though  . Hypertension    takes Losartan,Amlodipine,HCTZ,and Atenolol daily and Clatarpres  . Ischemic cardiomyopathy    a. 11/2016 s/p MDT BiV ICD w/ his bundle pacing.  . Joint pain    legs  . Renal artery stenosis (HCC)    Right renal artery stent 05/31/06  . Stroke [facility]) Jan. 7, 2014  . Tobacco abuse   . Type II diabetes mellitus (HCC)      Assessment: 38 yoF admitted with CP and positive troponins. Pt with known 3vCAD with no interventional or surgical options - to treat medically with IV heparin for now. Heparin therapeutic, CBC stable.  Goal of Therapy:  Heparin level 0.3-0.7 units/ml Monitor platelets by anticoagulation protocol: Yes   Plan:  Continue heparin 950 units/hr F/U heparin length of therapy - potentially stop today Daily heparin level and CBC   Nida Boatman, PharmD PGY1 Acute Care Pharmacy Resident 06/28/2017

## 2017-06-29 DIAGNOSIS — I200 Unstable angina: Secondary | ICD-10-CM

## 2017-06-29 DIAGNOSIS — N179 Acute kidney failure, unspecified: Secondary | ICD-10-CM

## 2017-06-29 LAB — BASIC METABOLIC PANEL
Anion gap: 14 (ref 5–15)
BUN: 60 mg/dL — AB (ref 6–20)
CO2: 19 mmol/L — ABNORMAL LOW (ref 22–32)
CREATININE: 3.37 mg/dL — AB (ref 0.44–1.00)
Calcium: 10.1 mg/dL (ref 8.9–10.3)
Chloride: 105 mmol/L (ref 101–111)
GFR, EST AFRICAN AMERICAN: 15 mL/min — AB (ref >60)
GFR, EST NON AFRICAN AMERICAN: 13 mL/min — AB (ref >60)
Glucose, Bld: 112 mg/dL — ABNORMAL HIGH (ref 65–99)
Potassium: 3.8 mmol/L (ref 3.5–5.1)
SODIUM: 138 mmol/L (ref 135–145)

## 2017-06-29 LAB — CBC
HCT: 29.7 % — ABNORMAL LOW (ref 36.0–46.0)
Hemoglobin: 9.4 g/dL — ABNORMAL LOW (ref 12.0–15.0)
MCH: 28.5 pg (ref 26.0–34.0)
MCHC: 31.6 g/dL (ref 30.0–36.0)
MCV: 90 fL (ref 78.0–100.0)
PLATELETS: 97 10*3/uL — AB (ref 150–400)
RBC: 3.3 MIL/uL — AB (ref 3.87–5.11)
RDW: 15.8 % — ABNORMAL HIGH (ref 11.5–15.5)
WBC: 4.1 10*3/uL (ref 4.0–10.5)

## 2017-06-29 LAB — GLUCOSE, CAPILLARY
GLUCOSE-CAPILLARY: 112 mg/dL — AB (ref 65–99)
Glucose-Capillary: 115 mg/dL — ABNORMAL HIGH (ref 65–99)
Glucose-Capillary: 137 mg/dL — ABNORMAL HIGH (ref 65–99)

## 2017-06-29 MED ORDER — ROSUVASTATIN CALCIUM 10 MG PO TABS: 10.0000 mg | ORAL_TABLET | Freq: Every day | ORAL | 3 refills | Status: DC

## 2017-06-29 MED ORDER — CEFDINIR 300 MG PO CAPS
300.0000 mg | ORAL_CAPSULE | Freq: Every day | ORAL | Status: DC
  Administered 2017-06-29: 300 mg via ORAL
  Filled 2017-06-29: qty 1

## 2017-06-29 MED ORDER — DOCUSATE SODIUM 100 MG PO CAPS: 100.0000 mg | ORAL_CAPSULE | Freq: Every day | ORAL | Status: DC | PRN

## 2017-06-29 MED ORDER — CEFDINIR 300 MG PO CAPS: 300.0000 mg | ORAL_CAPSULE | Freq: Every day | ORAL | 0 refills | Status: AC

## 2017-06-29 NOTE — Discharge Summary (Signed)
Discharge Summary    Patient ID: Kristen Bradley,  MRN: [id number], DOB/AGE: 08-20-1945 72 y.o.  Admit date: 06-Dec-202019 Discharge date: 06/29/2017  Primary Care Provider: Aura Bradley Primary Cardiologist: Kristen Chandler, MD  Discharge Diagnoses    Principal Problem:   Acute systolic HF (heart failure) ([place]) Active Problems:   PAD (peripheral artery disease) ([place])   Community acquired pneumonia   Hypertension   Acute kidney injury ([place])   Diabetes mellitus with complication ([place])   Thrombocytopenia- plts 81K   Elevated brain natriuretic peptide (BNP) level   NSTEMI (non-ST elevated myocardial infarction) (H[facility])   CAD, multiple vessel   Tobacco abuse   Carotid artery disease (H[facility])   AKI (acute kidney injury) ([place])   Unstable angina (H[facility])  Allergies Allergies  Allergen Reactions  . Chlorhexidine Gluconate Itching  . Ace Inhibitors Cough  . Dilaudid [Hydromorphone Hcl] Itching   Diagnostic Studies/Procedures    None  History of Present Illness     Kristen Bradley has a history of CAD with MI, LBBB, ICM, ICD, chronic systolic heart failure, renal artery stenosis with stent placement, PAD, HTN, HLD, DM 2 tobacco abuse.  Cardiac cath 02/03/2016 revealed severe three-vessel disease with severe calcification of all vessels and subsequent echocardiogram with EF of 25%, mild MR.  She was not felt to be a candidate for CABG or PCI.  She had a by BiV ICD (MDT) placed 11/05/2016 with a His bundle lead placed in narrowing of her complex.  Her ARB was stopped by nephrology and statin was stopped due to elevated LFTs on Lipitor 80 mg daily.  Her last echocardiogram 03/14/2017 with improved EF to 55 to 60% with G1 DD and PA peak pressures 34 mmHg.  Also history of AAA at 3.6 cm at widest and stable on recent scan.  She has seen pulmonary for enlarging right LL lung nodule just concerning for lung cancer recurrent breast cancer.  Patient presented to [facility] ED on 06-Dec-202019 with sudden onset of  shortness of breath and chest pain. She had previously been in her usual state of health until 2 PM on the previous day. She describes discomfort as throat and chest tightness. She did take one nitroglycerin with resolution.      Cardiology was contacted to admit for IV diuresis and recheck echocardiogram.  Hospital Course    When she arrived to the ED her chest discomfort was approximately an 8-9/10 in severity down to a 5.  She was started on IV heparin and IV nitroglycerin at 40mcg.  EKG revealed sinus rhythm with by V pacing QRS 125 ms.  Her troponin was noted to be 1.20 (peak 4.48) with a BNP of 1355.  Chest x-ray revealed right basilar airspace disease.  Follow-up PA and lateral chest x-ray is recommended in 3 to 4 weeks following a trial of antibiotic therapy to ensure resolution and exclude underlying malignancy.  See PET scan for results. She was ultimately admitted per cardiology for IV Lasix for acute CHF exacerbation and placed on IV nitroglycerin and IV heparin for chest pain.  Her troponins were positive (2.72, 4.19, 4.48) and she is being treated for unstable angina with medical treatment.  She has put out 9.2 L with 5.2 L since admission for net negative balance of 4 L.  Creatinine was 3.45 on admission is now down slightly to 3.37 on 06/29/2017.  Her baseline creatinine from 2018 was approximately 1.7- 2.1.  Problem list as outlined below during her hospitalization:  CAD:  -History of CAD per cath with three-vessel calcified disease not amenable to PCI or bypass grafting.  She was admitted with unstable angina had medically positive enzymes.  She was started on low-dose long-acting oral nitrate in addition to her other medications.  She is remained chest pain-free and tolerating long-acting nitrates. -Continue ASA, Imdur 30, Toprol 25 and Crestor   Acute on chronic combined systolic and diastolic CHF with ischemic cardiomyopathy: -Her EF was initially depressed but improved  significantly with IV ICD up to the 50 to 55% range -She was admitted with congestive heart failure with BNP was 1300 and was diuresed approximately 2 L with 80 mg of Lasix IV twice daily and changed to p.o. -Lasix was eventually stopped on 06/28/2017 due to worsening renal function -Net -3.9 L, with 24-hour output 1.8 L -Discharge weight 150lb -Her renal function however at baseline was 1.77 which is been slowly increasing and is now 3.37 despite holding Lasix -Nephrology was consulted, see note below  Acute on chronic renal failure stage III: -Nephrology consulted for increased creatinine at the peak of 3.45 this admission.  Baseline appears to be in the 1.7-2.1 range.  IV diuretics were changed to PO on 06/27/2017. -She was last seen by Kristen Bradley 06/22/2017.  Creatinine at that time was 3.36 and patient stated she did not want dialysis so would not be referred to dialysis education classes.   -Nephrology recommends following as regularly scheduled in clinic with Kristen Bradley.  Patient will need to call make appointment.  Hypertension: -BP stable at 120/70 2 mmHg -Continue amlodipine 10 mg daily, clonidine 0.1 mg twice daily and Toprol XL 25 mg daily  Lung nodule: -Patient underwent PET scan which will be discussed with her pulmonologist as an outpatient.  May want to address palliative care during this time  CAP: -he remains afebrile and WCB is normal at 4.6 -Omnicef 300 mg daily for 6 days, day 1>>>she will stop on 07/05/17  DM2: -Stable, patient was treated with SSI for glucose control -Will need to follow further with PCP  Consultants: Nephrology, pulmonology  Patient was seen and examined by Kristen Bradley who feels that she is stable and ready for discharge on 06/29/2017 after being seen by nephrology.  We will make follow-up appointment she will need to be seen by Kristen Bradley. _____________  Discharge Vitals Blood pressure 112/66, pulse 67, temperature 98.6 F (37 C), temperature source  Oral, resp. rate 20, height 5\' 6"  (1.676 m), weight 150 lb 9.6 oz (68.3 kg), SpO2 100 %.  Filed Weights   06/28/17 0500 06/28/17 1558 06/29/17 0439  Weight: 151 lb 0.2 oz (68.5 kg) 150 lb 8 oz (68.3 kg) 150 lb 9.6 oz (68.3 kg)   Labs & Radiologic Studies    CBC Recent Labs    06/28/17 0323 06/29/17 0750  WBC 4.6 4.1  HGB 9.2* 9.4*  HCT 29.2* 29.7*  MCV 89.3 90.0  PLT 96* 97*   Basic Metabolic Panel Recent Labs    06/28/17 0323 06/29/17 0750  NA 137 138  K 3.8 3.8  CL 107 105  CO2 19* 19*  GLUCOSE 123* 112*  BUN 55* 60*  CREATININE 3.35* 3.37*  CALCIUM 10.2 10.1   _____________  Dg Chest 2 View  Result Date: March 01, 202019 CLINICAL DATA:  Chest tightness and short of breath EXAM: CHEST - 2 VIEW COMPARISON:  11/16/2016 FINDINGS: The heart is moderately enlarged. Left subclavian AICD device is stable. Vascular congestion. Right basilar airspace disease. No  pneumothorax. No pleural effusion. IMPRESSION: Right basilar airspace disease. Followup PA and lateral chest X-ray is recommended in 3-4 weeks following trial of antibiotic therapy to ensure resolution and exclude underlying malignancy. Electronically Signed   By: Marybelle Killings M.D.   On: 04/26/202019 12:31   Nm Pet Image Initial (pi) Skull Base To Thigh  Result Date: 06/17/2017 CLINICAL DATA:  Initial treatment strategy for lung nodule. EXAM: NUCLEAR MEDICINE PET SKULL BASE TO THIGH TECHNIQUE: 8.0 mCi F-18 FDG was injected intravenously. Full-ring PET imaging was performed from the skull base to thigh after the radiotracer. CT data was obtained and used for attenuation correction and anatomic localization. Fasting blood glucose: 121 mg/dl COMPARISON:  Multiple exams, including CT scan 05/18/2017 and 12/31/2010 FINDINGS: Mediastinal blood pool activity: SUV max 2.6 Note: Misregistered CT and PET data in the head/neck region. I carefully reviewed the non attenuation corrected images. NECK: No significant abnormal hypermetabolic activity  in this region. Incidental CT findings: Carotid atherosclerotic calcifications. CHEST: The right lower lobe nodule has indistinct margins, probably because it is blurred by motion artifact. This nodule measures approximately 1.0 by 0.6 cm on today's exam, with maximum standard uptake value of this indistinct density 1.2. Incidental CT findings: Right anterior subpleural reticulation along the right middle lobe without hypermetabolic activity, compatible with prior radiation therapy port. Coronary, aortic arch, and branch vessel atherosclerotic vascular disease. Chronically stable saccular outpouching of the aortic arch measuring approximately 1.7 by 1.1 cm on image 54/4, similar to that shown on prior chest CT. Mild-to-moderate cardiomegaly. Pacer/AICD device noted. 0.7 cm right paratracheal lymph node on image 54/4. No hypermetabolic nodal activity in the chest. ABDOMEN/PELVIS: No significant abnormal hypermetabolic activity in this region. Incidental CT findings: Postoperative findings along the pancreatic tail and adjacent to the spleen. Aortoiliac atherosclerotic vascular disease. Bilateral renal lesions of varying complexity and density; no obvious lesions hypermetabolic to the renal parenchyma but this does not necessarily exclude small renal neoplasm. Infrarenal abdominal aortic aneurysm measuring up to 3.8 cm obliquely, slight saccular component, not changed from previous. No pathologic adenopathy. Descending and sigmoid colon diverticulosis. There continues to be focal accentuated density adjacent to the sigmoid colon measuring 2.6 by 1.6 cm on image 152/4. This has low-grade accentuated metabolic activity with maximum SUV 3.6. This has a slightly and slightly tethering appearance on multiplanar imaging. SKELETON: No significant abnormal hypermetabolic activity in this region. Incidental CT findings: Degenerative glenohumeral arthropathy on the left. Lumbar spondylosis and degenerative disc disease at L3-4  and L4-5. IMPRESSION: 1. The right lower lobe nodule is blurred by motion artifact and has slightly indistinct nodules but measures about 1.0 by 0.6. Maximum standard uptake value is 1.2. Although the maximum SUV is somewhat reassuring, low-grade adenocarcinoma is not excluded, and the probable increase in size compared to 2015 is still concerning. At a minimum, surveillance by chest CT is likely warranted. 2. Again observed is a bandlike density adjacent to the sigmoid colon in the upper pelvis with low-grade metabolic activity, maximum SUV 3.6. This has an appearance suggesting postinflammatory findings related to prior diverticulitis. Strictly speaking, a carcinoid tumor or local colon cancer could have a similar appearance, and I would recommend correlation with colon cancer screening and continued surveillance. 3. Other imaging findings of potential clinical significance: Aortic Atherosclerosis (ICD10-I70.0). Coronary atherosclerosis. Chronic saccular outpouching of the aortic arch. Aortic aneurysm NOS (ICD10-I71.9). Abdominal aortic aneurysm 3.6 cm at its widest. This is stable. Postoperative findings along the pancreatic tail and spleen. Mild-to-moderate cardiomegaly. Bilateral renal  lesions of varying complexity, most likely to be cysts although not technically specific in some cases. Descending and sigmoid colon diverticulosis. Lumbar spondylosis and degenerative disc disease. Electronically Signed   By: Van Clines M.D.   On: 06/17/2017 08:58   Disposition   Pt is being discharged home today in good condition.  Follow-up Plans & Appointments    Follow-up Information    [place] [facility] Follow up.   Specialty:  Cardiology Why:  Office will call with appointment date and time soon.  Expect appointment to be within the next 7 to 10 days. Contact information: [address] [place] [phone]         Discharge Instructions      Call MD for:  persistant nausea and vomiting   Complete by:  As directed    Call MD for:  severe uncontrolled pain   Complete by:  As directed    Call MD for:  temperature >100.4   Complete by:  As directed    Diet - low sodium heart healthy   Complete by:  As directed    Discharge instructions   Complete by:  As directed    You will need to stop taking your Lasix for now.  Please follow-up with Kristen Bradley with nephrology.  Heart care office will be calling you for an appointment date and time.  Please expect this appointment to be within the next 7 to 10 days.  If you have not heard from our office please call.  You will need to take your antibiotic until 07/05/2017.  I have sent your medications in to [facility] on [facility]   Increase activity slowly   Complete by:  As directed      Discharge Medications   Allergies as of 06/29/2017      Reactions   Chlorhexidine Gluconate Itching   Ace Inhibitors Cough   Dilaudid [hydromorphone Hcl] Itching      Medication List    STOP taking these medications   furosemide 80 MG tablet Commonly known as:  LASIX     TAKE these medications   acetaminophen 500 MG tablet Commonly known as:  TYLENOL Take 1,000 mg by mouth every 6 (six) hours as needed for mild pain or headache.   ALPRAZolam 0.5 MG tablet Commonly known as:  XANAX Take 1 tablet 30-45 mins prior to PET scan.   amLODipine 10 MG tablet Commonly known as:  NORVASC Take 10 mg by mouth daily.   aspirin EC 81 MG tablet Take 81 mg by mouth daily.   cefdinir 300 MG capsule Commonly known as:  OMNICEF Take 1 capsule (300 mg total) by mouth daily for 5 days.   cloNIDine 0.1 MG tablet Commonly known as:  CATAPRES Take 0.1 mg by mouth 2 (two) times daily.   fenofibrate 48 MG tablet Commonly known as:  TRICOR Take 1 tablet (48 mg total) by mouth daily.   glipiZIDE 5 MG tablet Commonly known as:  GLUCOTROL Take 2.5 mg by mouth daily before breakfast.   IRON  PO Take 1 tablet by mouth daily.   isosorbide mononitrate 30 MG 24 hr tablet Commonly known as:  IMDUR TAKE 1 TABLET BY MOUTH ONCE DAILY   MAVYRET 100-40 MG Tabs Generic drug:  Glecaprevir-Pibrentasvir Take 3 tablets by mouth daily.   metoprolol succinate 25 MG 24 hr tablet Commonly known as:  TOPROL-XL TAKE 1 TABLET BY MOUTH ONCE DAILY   nitroGLYCERIN  0.4 MG SL tablet Commonly known as:  NITROSTAT Place 1 tablet (0.4 mg total) under the tongue every 5 (five) minutes as needed for chest pain.   rosuvastatin 10 MG tablet Commonly known as:  CRESTOR Take 1 tablet (10 mg total) by mouth daily. Start taking on:  06/30/2017 What changed:    medication strength  how much to take        Outstanding Labs/Studies   Follow with Kristen Bradley  Duration of Discharge Encounter   Greater than 30 minutes including physician time.  Signed, [place], NP 06/29/2017, 4:53 PM

## 2017-06-29 NOTE — Progress Notes (Addendum)
Progress Note  Patient Name: Kristen Bradley Date of Encounter: 06/29/2017  Primary Cardiologist: [name], MD   Subjective   Denies any chest pain or SOB  Inpatient Medications    Scheduled Meds: . amLODipine  10 mg Oral Daily  . aspirin EC  81 mg Oral Daily  . cloNIDine  0.1 mg Oral BID  . Glecaprevir-Pibrentasvir  3 tablet Oral Daily  . heparin injection (subcutaneous)  5,000 Units Subcutaneous Q8H  . insulin aspart  0-5 Units Subcutaneous QHS  . insulin aspart  0-9 Units Subcutaneous TID WC  . isosorbide mononitrate  30 mg Oral Daily  . metoprolol succinate  25 mg Oral Daily  . pneumococcal 23 valent vaccine  0.5 mL Intramuscular Tomorrow-1000  . rosuvastatin  10 mg Oral Daily   Continuous Infusions: . sodium chloride Stopped (06/26/17 0601)   PRN Meds: sodium chloride, acetaminophen, acetaminophen, alum & mag hydroxide-simeth, nitroGLYCERIN, ondansetron (ZOFRAN) IV   Vital Signs    Vitals:   06/28/17 2100 06/28/17 2346 06/29/17 0439 06/29/17 0700  BP: 124/65 132/61 139/66 120/72  Pulse: 75 73 69 71  Resp:  20 20 18   Temp: 98 F (36.7 C)  98.4 F (36.9 C) 98.5 F (36.9 C)  TempSrc: Oral  Oral Oral  SpO2: 100% 98% 100% 99%  Weight:   150 lb 9.6 oz (68.3 kg)   Height:        Intake/Output Summary (Last 24 hours) at 06/29/2017 0955 Last data filed at 06/29/2017 0900 Gross per 24 hour  Intake 1649.14 ml  Output 1300 ml  Net 349.14 ml   Filed Weights   06/28/17 0500 06/28/17 1558 06/29/17 0439  Weight: 151 lb 0.2 oz (68.5 kg) 150 lb 8 oz (68.3 kg) 150 lb 9.6 oz (68.3 kg)    Telemetry    NSR - Personally Reviewed  ECG    No new EKG to review - Personally Reviewed  Physical Exam   GEN: No acute distress.   Neck: No JVD Cardiac: RRR, no murmurs, rubs, or gallops.  Respiratory: Clear to auscultation bilaterally. GI: Soft, nontender, non-distended  MS: No edema; No deformity. Neuro:  Nonfocal  Psych: Normal affect   Labs      Chemistry Recent Labs  Lab 06/25/17 1143 06/25/17 1956 06/26/17 0255 06/28/17 0323 06/29/17 0750  NA 144  --  136 137 138  K 3.6  --  2.9* 3.8 3.8  CL 116*  --  109 107 105  CO2 17*  --  16* 19* 19*  GLUCOSE 132*  --  108* 123* 112*  BUN 50*  --  48* 55* 60*  CREATININE 3.45*  --  3.00* 3.35* 3.37*  CALCIUM 10.0  --  9.5 10.2 10.1  PROT 7.2 7.2  --   --   --   ALBUMIN 3.4* 3.6  --   --   --   AST 17 21  --   --   --   ALT 9* 8*  --   --   --   ALKPHOS 89 86  --   --   --   BILITOT 0.7 1.0  --   --   --   GFRNONAA 12*  --  15* 13* 13*  GFRAA 14*  --  17* 15* 15*  ANIONGAP 11  --  11 11 14      Hematology Recent Labs  Lab 06/27/17 0219 06/28/17 0323 06/29/17 0750  WBC 4.5 4.6 4.1  RBC 3.07* 3.27* 3.30*  HGB 8.8* 9.2* 9.4*  HCT 27.8* 29.2* 29.7*  MCV 90.6 89.3 90.0  MCH 28.7 28.1 28.5  MCHC 31.7 31.5 31.6  RDW 16.1* 15.9* 15.8*  PLT 94* 96* 97*    Cardiac Enzymes Recent Labs  Lab 06/25/17 1956 06/26/17 0255 06/26/17 0702  TROPONINI 2.72* 4.19* 4.48*    Recent Labs  Lab 06/25/17 1206  TROPIPOC 1.20*     BNP Recent Labs  Lab 06/25/17 1143  BNP 1,355.5*     DDimer No results for input(s): DDIMER in the last 168 hours.   Radiology    No results found.  Cardiac Studies   none  Patient Profile     72 y.o. female with a history of diffuse CAD by cath thought not to be revascularizable because of diffuse calcification. She does have ischemic cardia myopathy which improved with biventricular pacing/ICD placed by Dr. Lovena Le in October. Her EF improved to the 50 to 55% range. She was admitted with unstable angina and heart failure. Her enzymes are minimally positive and her BNP was elevated at 1300. Apparently she has decided that she wishes to be a "DNR".  Assessment & Plan    1: Coronary artery disease -history of CAD by cath with three-vessel calcified disease not amenable to PCI or bypass grafting.  -She was admitted with unstable angina  and had medically positive enzymes.  -she is tolerating addition of long acting nitrates with no further CP -continue ASA 81mg  daily, Imdur 30mg  daily, Toprol XL 25mg  daily and crestor 20mg  daily -SQ heparin for DVT prophylaxis  2: Acute on chronic combined systolic/diastolic CHF with Ischemic cardiomyopathy -her EF was initially depressed but improved significantly with BiV-ICD up to the 50 to 55% range.  -She was admitted with congestive heart failure and BNP was 1300.  -She was diuresed proximately 2 L with 80 mg of Lasix IV twice daily and changed to PO -Lasix stopped yesterday due to worsening renal function -She put out 1.8 L yesterday and is net -4.2 L -weight is down 10lbs with no change in weight overnight. -Her renal function however at baseline was 1.77 which has been slowly increasing and is now 3.37 despite holding Lasix yesterday  3: Acute on chronic renal failure -Baseline creatinine was 1.778 months ago -creatinine now bumped back up to 3.37 from 3 on 5/26 -Diuretics stopped yesterday -will consult Renal for recommendations.  4: Essential hypertension -BP stable at 120/72 mmHg today -continue amlodipine 10mg  daily, Clonidine 0.1mg  BID and Toprol XL 25mg  daily.    5. Lung nodule -She apparently has a PET scan which will be discussed by her pulmonologist. We may want to address palliative care.  6.  CAP - on IV antibx day # 5 -she remains afebrile and WBC is normal at 4.6. -stop IV Antibx and change to Omnicef 300mg  daily for 6 days -encouraged her to ambulate and sit up in chair  Patient notified the nurse and myself today that she would like to be a partial code with everything done except no intubation.    For questions or updates, please contact [place] Please consult [email or web address] for contact info under Cardiology/STEMI.      Signed, Fransico Him, MD  06/29/2017, 9:55 AM

## 2017-06-29 NOTE — Consult Note (Signed)
Renal Service Consult Note [facility]  Kristen Bradley 06/29/2017 Sol Blazing Requesting Physician:  Dr Lovena Le  Reason for Consult:  Renal failure HPI: The patient is a 72 y.o. year-old with history of CAD / LBBB/ chron systolic chf/ ICD/ ICM./ hx RAS with renal artery stent, hepatitis C, who presented from home by ems for chest pain and dyspnea on may 25th.  History of cardiac cath on jan 2019 admit which showed severe 3 vessel disease not a candidate for cabg or pci.  Pt was admitted and started on IV lasix for acute chf exacerbation as well as IV ntg and IV heparin for CP.  Troponins were + and she is being treated for unstable angina w/ medical Rx.  She has put out 9.2 L urine w/ 5.2 liters in since admission for net negative balance of 4 L.  Creat was 3.45 on admission and is now down slightly at 3.37.  Baseline creat from 2018 was 1.7- 2.1 (egfr 24- 32 ml/min).  Patient is on room air now, diuretics held after creat dropped to 3.0 then bumped back up to 3.0.  CXR from admission showed vasc congestion no gross edema. History of renal artery stenosis w renal artery stent. Asked to see for renal failure.   BP's here were borderline high 142/68 on admission and now are down to 112/ 65 range.  Pt on room air w/ SpO2 100%. Patient currently denies any CP or SOB and says her breathing is back to normal.  No abd swelling or LE edema.   Current meds:   - norvasc 10/ clonidine 0.1 bid/ toprol xl 25 qd/ imdur 30 qd  - crestor/ insulin ssi/ sq heparin/ glecaprevir- pibrentasvir   last [facility] visit 06/22/17 w/ Dr Justin Mend > creat was 3.36 and patient said she doesn't want dialysis so would not be referred to dialysis education class   date   Creat  eGFR   2008- 13   0.5- 0.9  2014- 17  1.2- 1.9  2018   1.6- 2.1 23- 34  Jun 25 2017  3.45  may 28  Jun 29 2017   3.37   15    last renal US > feb 2019  >> 9.5 / 9.2 cm kidneys w/ no hydro    ROS  denies CP  no joint pain   no HA  no  blurry vision  no rash  no diarrhea  no nausea/ vomiting  no dysuria  no difficulty voiding  no change in urine color    Past Medical History  Past Medical History:  Diagnosis Date  . Adrenal tumor 08/2007   surgery  . Cancer of right breast [facility])    s/p lumpectomy, XRT  . Carotid artery disease (HCC)    s/p bilateral CEA  . Hepatitis C    "got treatment for it" (01/29/2016)  . HFrEF (heart failure with reduced ejection fraction) ([place])   . High cholesterol    takes Lopid daily  . History of UTI    takes Diflucan dail--pt states no uti in a couple of yrs though  . Hypertension    takes Losartan,Amlodipine,HCTZ,and Atenolol daily and Clatarpres  . Ischemic cardiomyopathy    a. 11/2016 s/p MDT BiV ICD w/ his bundle pacing.  . Joint pain    legs  . Renal artery stenosis (HCC)    Right renal artery stent 05/31/06  . Stroke [facility]) Jan. 7, 2014  . Tobacco abuse   . Type  II diabetes mellitus [facility])    Past Surgical History  Past Surgical History:  Procedure Laterality Date  . ABDOMINAL HYSTERECTOMY    . ADRENAL GLAND SURGERY  2009  . BIV ICD INSERTION CRT-D N/A 11/05/2016   Procedure: BIV ICD INSERTION CRT-D;  Surgeon: Evans Lance, MD;  Location: [place] CV LAB;  Service: Cardiovascular;  Laterality: N/A;  . BREAST LUMPECTOMY Right 2009  . CARDIAC CATHETERIZATION N/A 02/03/2016   Procedure: Right/Left Heart Cath and Coronary Angiography;  Surgeon: Burnell Blanks, MD;  Location: [place] CV LAB;  Service: Cardiovascular;  Laterality: N/A;  . ENDARTERECTOMY  01/05/2012   Procedure: ENDARTERECTOMY CAROTID;  Surgeon: Conrad Meriden, MD;  Location: [place];  Service: Vascular;  Laterality: Right;  Right Carotid Artery Endarterectomy, right stump pressure, intraoperative ultrasound  . ENDARTERECTOMY  02/08/2012   Procedure: ENDARTERECTOMY CAROTID;  Surgeon: Conrad Deferiet, MD;  Location: [place];  Service: Vascular;  Laterality: Left;  . LUMBAR DISC SURGERY    . PATCH  ANGIOPLASTY  02/08/2012   Procedure: PATCH ANGIOPLASTY;  Surgeon: Conrad Kodiak, MD;  Location: [facility] OR;  Service: Vascular;  Laterality: Left;  Carotid artery patch angioplasty using Vascu-Guard bovine patch 1cm x 6cm.  . RENAL ARTERY STENT Right 05/2006   Family History  Family History  Problem Relation Age of Onset  . Cancer Mother        ? type  . Cancer Father        ? type  . Diabetes Sister   . Hypertension Sister   . Diabetes Brother   . Hypertension Brother   . CAD Neg Hx    Social History  reports that she quit smoking about 15 months ago. Her smoking use included cigarettes. She has a 26.50 pack-year smoking history. She has never used smokeless tobacco. She reports that she does not drink alcohol or use drugs. Allergies  Allergies  Allergen Reactions  . Chlorhexidine Gluconate Itching  . Ace Inhibitors Cough  . Dilaudid [Hydromorphone Hcl] Itching   Home medications Prior to Admission medications   Medication Sig Start Date End Date Taking? Authorizing Provider  acetaminophen (TYLENOL) 500 MG tablet Take 1,000 mg by mouth every 6 (six) hours as needed for mild pain or headache.    Yes [provider]  amLODipine (NORVASC) 10 MG tablet Take 10 mg by mouth daily.    Yes [provider]  aspirin EC 81 MG tablet Take 81 mg by mouth daily.     Yes [provider]  cloNIDine (CATAPRES) 0.1 MG tablet Take 0.1 mg by mouth 2 (two) times daily.     Yes [provider]  fenofibrate (TRICOR) 48 MG tablet Take 1 tablet (48 mg total) by mouth daily. 05/03/17  Yes Evans Lance, MD  glipiZIDE (GLUCOTROL) 5 MG tablet Take 2.5 mg by mouth daily before breakfast.    Yes [provider]  IRON PO Take 1 tablet by mouth daily.   Yes [provider]  isosorbide mononitrate (IMDUR) 30 MG 24 hr tablet TAKE 1 TABLET BY MOUTH ONCE DAILY 05/24/17  Yes Burnell Blanks, MD  MAVYRET 100-40 MG TABS Take 3 tablets by mouth daily. 06/06/17  Yes  [provider]  metoprolol succinate (TOPROL-XL) 25 MG 24 hr tablet TAKE 1 TABLET BY MOUTH ONCE DAILY 05/24/17  Yes Burnell Blanks, MD  nitroGLYCERIN (NITROSTAT) 0.4 MG SL tablet Place 1 tablet (0.4 mg total) under the tongue every 5 (five)  minutes as needed for chest pain. 03/02/16  Yes Burnell Blanks, MD  rosuvastatin (CRESTOR) 20 MG tablet Take 1 tablet (20 mg total) by mouth daily. 05/03/17 08/01/17 Yes Evans Lance, MD  ALPRAZolam Duanne Moron) 0.5 MG tablet Take 1 tablet 30-45 mins prior to PET scan. Patient not taking: Reported on 06/23/202019 06/14/17   Marshell Garfinkel, MD  furosemide (LASIX) 80 MG tablet Take 1 tablet (80 mg total) by mouth 2 (two) times daily. Patient not taking: Reported on 06/23/202019 07/30/16   Burnell Blanks, MD   Liver Function Tests Recent Labs  Lab 06/25/17 1143 06/25/17 1956  AST 17 21  ALT 9* 8*  ALKPHOS 89 86  BILITOT 0.7 1.0  PROT 7.2 7.2  ALBUMIN 3.4* 3.6   No results for input(s): LIPASE, AMYLASE in the last 168 hours. CBC Recent Labs  Lab 06/25/17 1143  06/27/17 0219 06/28/17 0323 06/29/17 0750  WBC 7.7   < > 4.5 4.6 4.1  NEUTROABS 6.6  --   --   --   --   HGB 9.6*   < > 8.8* 9.2* 9.4*  HCT 31.0*   < > 27.8* 29.2* 29.7*  MCV 91.7   < > 90.6 89.3 90.0  PLT 116*   < > 94* 96* 97*   < > = values in this interval not displayed.   Basic Metabolic Panel Recent Labs  Lab 06/25/17 1143 06/26/17 0255 06/28/17 0323 06/29/17 0750  NA 144 136 137 138  K 3.6 2.9* 3.8 3.8  CL 116* 109 107 105  CO2 17* 16* 19* 19*  GLUCOSE 132* 108* 123* 112*  BUN 50* 48* 55* 60*  CREATININE 3.45* 3.00* 3.35* 3.37*  CALCIUM 10.0 9.5 10.2 10.1   Iron/TIBC/Ferritin/ %Sat    Component Value Date/Time   IRON 39 03/04/2016 1532   TIBC 332 03/04/2016 1532   IRONPCTSAT 12 03/04/2016 1532    Vitals:   06/29/17 0439 06/29/17 0700 06/29/17 0958 06/29/17 1157  BP: 139/66 120/72 135/65 112/66  Pulse: 69 71 71 67  Resp: _0 Temp: 98.4 F (36.9 C) 98.5 F (36.9 C)  98.6 F (37 C)  TempSrc: Oral Oral  Oral  SpO2: 100% 99% 99% 100%  Weight: 68.3 kg (150 lb 9.6 oz)     Height:       Exam Gen alert not in distress lying flat No rash, cyanosis or gangrene Sclera anicteric, throat clear No jvd or bruits bilat scars from cea surgeries Chest clear bilat to bases RRR no MRG Abd soft ntnd no mass or ascites +bs GU defer MS no joint effusions or deformity Ext no LE edema, no wounds or ulcers Neuro is alert, Ox 3 , nf     home meds:  - norvasc 10/ clonidine 0.1 bid/ lasix 80 bid/ imdur 30/ toprol xl 25 qd  - mavyret 100/40 qd  - glucotrol 2.5 qd  - crestor/ sl ntg/ tricor/ ecasa/ xanax prn/ prn's    5/25 CXR - vasc congestion not gross edema    Impression: 1  CKD stage IV - patient's creatinine is at new baseline and diuresis has been accomplished, no need for further renal w/u, she was seen recently in renal clinic w/ creat 3.3 and patient does not want dialysis so is not being referred for access or dialysis education.  She can f/u in clinic w/ dr Justin Mend as scheduled.  2  HTN - stable cont meds 3  Hep C 4  DM2 5  CAD / unstable angina - chest pain resolved w medical Rx 6  ICM sp ICD 7  CAP - sp IV abx now on po abx   Plan - as above  Kelly Splinter MD [employer] pager [phone]   06/29/2017, 2:42 PM

## 2017-06-29 NOTE — Progress Notes (Signed)
Discharge patient home with family.  Gave 2 remaining doses of glecaprevir-pibrenstavir to patient son.   Documentation in chart.   Discussed discharge instruction and follow up/ medication with family and patient.  Sent home instructions.  Removed IVs.

## 2017-07-06 DIAGNOSIS — N184 Chronic kidney disease, stage 4 (severe): Secondary | ICD-10-CM | POA: Diagnosis not present

## 2017-07-06 DIAGNOSIS — I5022 Chronic systolic (congestive) heart failure: Secondary | ICD-10-CM | POA: Diagnosis not present

## 2017-07-06 DIAGNOSIS — I10 Essential (primary) hypertension: Secondary | ICD-10-CM | POA: Diagnosis not present

## 2017-07-06 DIAGNOSIS — R948 Abnormal results of function studies of other organs and systems: Secondary | ICD-10-CM | POA: Diagnosis not present

## 2017-07-06 DIAGNOSIS — I739 Peripheral vascular disease, unspecified: Secondary | ICD-10-CM | POA: Diagnosis not present

## 2017-07-06 DIAGNOSIS — I2584 Coronary atherosclerosis due to calcified coronary lesion: Secondary | ICD-10-CM | POA: Diagnosis not present

## 2017-07-06 DIAGNOSIS — R911 Solitary pulmonary nodule: Secondary | ICD-10-CM | POA: Diagnosis not present

## 2017-07-09 ENCOUNTER — Encounter (HOSPITAL_COMMUNITY): Payer: Self-pay | Admitting: Emergency Medicine

## 2017-07-09 ENCOUNTER — Other Ambulatory Visit: Payer: Self-pay

## 2017-07-09 ENCOUNTER — Inpatient Hospital Stay (HOSPITAL_COMMUNITY)
Admission: EM | Admit: 2017-07-09 | Discharge: 2017-07-12 | DRG: 291 | Disposition: A | Discharge status: Home / Self Care | Payer: Medicare HMO | Attending: Internal Medicine | Admitting: Internal Medicine

## 2017-07-09 ENCOUNTER — Emergency Department (HOSPITAL_COMMUNITY): Payer: Medicare HMO

## 2017-07-09 DIAGNOSIS — Z87891 Personal history of nicotine dependence: Secondary | ICD-10-CM | POA: Diagnosis not present

## 2017-07-09 DIAGNOSIS — N185 Chronic kidney disease, stage 5: Secondary | ICD-10-CM | POA: Diagnosis not present

## 2017-07-09 DIAGNOSIS — E1122 Type 2 diabetes mellitus with diabetic chronic kidney disease: Secondary | ICD-10-CM | POA: Diagnosis present

## 2017-07-09 DIAGNOSIS — R0602 Shortness of breath: Secondary | ICD-10-CM | POA: Diagnosis not present

## 2017-07-09 DIAGNOSIS — I5023 Acute on chronic systolic (congestive) heart failure: Secondary | ICD-10-CM | POA: Diagnosis present

## 2017-07-09 DIAGNOSIS — Z960 Presence of urogenital implants: Secondary | ICD-10-CM | POA: Diagnosis present

## 2017-07-09 DIAGNOSIS — B182 Chronic viral hepatitis C: Secondary | ICD-10-CM | POA: Diagnosis present

## 2017-07-09 DIAGNOSIS — Z79899 Other long term (current) drug therapy: Secondary | ICD-10-CM | POA: Diagnosis not present

## 2017-07-09 DIAGNOSIS — E1159 Type 2 diabetes mellitus with other circulatory complications: Secondary | ICD-10-CM | POA: Diagnosis not present

## 2017-07-09 DIAGNOSIS — I509 Heart failure, unspecified: Secondary | ICD-10-CM

## 2017-07-09 DIAGNOSIS — N179 Acute kidney failure, unspecified: Secondary | ICD-10-CM | POA: Diagnosis not present

## 2017-07-09 DIAGNOSIS — I252 Old myocardial infarction: Secondary | ICD-10-CM | POA: Diagnosis not present

## 2017-07-09 DIAGNOSIS — I5021 Acute systolic (congestive) heart failure: Secondary | ICD-10-CM

## 2017-07-09 DIAGNOSIS — Z7984 Long term (current) use of oral hypoglycemic drugs: Secondary | ICD-10-CM | POA: Diagnosis not present

## 2017-07-09 DIAGNOSIS — I447 Left bundle-branch block, unspecified: Secondary | ICD-10-CM | POA: Diagnosis present

## 2017-07-09 DIAGNOSIS — I200 Unstable angina: Secondary | ICD-10-CM | POA: Diagnosis not present

## 2017-07-09 DIAGNOSIS — Z8673 Personal history of transient ischemic attack (TIA), and cerebral infarction without residual deficits: Secondary | ICD-10-CM

## 2017-07-09 DIAGNOSIS — I255 Ischemic cardiomyopathy: Secondary | ICD-10-CM | POA: Diagnosis present

## 2017-07-09 DIAGNOSIS — E872 Acidosis: Secondary | ICD-10-CM | POA: Diagnosis not present

## 2017-07-09 DIAGNOSIS — Z8679 Personal history of other diseases of the circulatory system: Secondary | ICD-10-CM

## 2017-07-09 DIAGNOSIS — E1151 Type 2 diabetes mellitus with diabetic peripheral angiopathy without gangrene: Secondary | ICD-10-CM | POA: Diagnosis present

## 2017-07-09 DIAGNOSIS — Z853 Personal history of malignant neoplasm of breast: Secondary | ICD-10-CM

## 2017-07-09 DIAGNOSIS — I25110 Atherosclerotic heart disease of native coronary artery with unstable angina pectoris: Secondary | ICD-10-CM | POA: Diagnosis present

## 2017-07-09 DIAGNOSIS — I361 Nonrheumatic tricuspid (valve) insufficiency: Secondary | ICD-10-CM | POA: Diagnosis not present

## 2017-07-09 DIAGNOSIS — I110 Hypertensive heart disease with heart failure: Secondary | ICD-10-CM | POA: Diagnosis not present

## 2017-07-09 DIAGNOSIS — I132 Hypertensive heart and chronic kidney disease with heart failure and with stage 5 chronic kidney disease, or end stage renal disease: Principal | ICD-10-CM | POA: Diagnosis present

## 2017-07-09 DIAGNOSIS — N184 Chronic kidney disease, stage 4 (severe): Secondary | ICD-10-CM

## 2017-07-09 DIAGNOSIS — Z883 Allergy status to other anti-infective agents status: Secondary | ICD-10-CM

## 2017-07-09 DIAGNOSIS — Z888 Allergy status to other drugs, medicaments and biological substances status: Secondary | ICD-10-CM

## 2017-07-09 DIAGNOSIS — R402413 Glasgow coma scale score 13-15, at hospital admission: Secondary | ICD-10-CM | POA: Diagnosis present

## 2017-07-09 DIAGNOSIS — I10 Essential (primary) hypertension: Secondary | ICD-10-CM | POA: Diagnosis not present

## 2017-07-09 DIAGNOSIS — Z7902 Long term (current) use of antithrombotics/antiplatelets: Secondary | ICD-10-CM

## 2017-07-09 DIAGNOSIS — Z923 Personal history of irradiation: Secondary | ICD-10-CM

## 2017-07-09 DIAGNOSIS — D631 Anemia in chronic kidney disease: Secondary | ICD-10-CM | POA: Diagnosis not present

## 2017-07-09 DIAGNOSIS — R0902 Hypoxemia: Secondary | ICD-10-CM | POA: Diagnosis present

## 2017-07-09 DIAGNOSIS — Z95810 Presence of automatic (implantable) cardiac defibrillator: Secondary | ICD-10-CM

## 2017-07-09 DIAGNOSIS — R062 Wheezing: Secondary | ICD-10-CM | POA: Diagnosis not present

## 2017-07-09 DIAGNOSIS — R911 Solitary pulmonary nodule: Secondary | ICD-10-CM | POA: Diagnosis present

## 2017-07-09 DIAGNOSIS — Z8719 Personal history of other diseases of the digestive system: Secondary | ICD-10-CM

## 2017-07-09 DIAGNOSIS — E785 Hyperlipidemia, unspecified: Secondary | ICD-10-CM | POA: Diagnosis present

## 2017-07-09 DIAGNOSIS — R0601 Orthopnea: Secondary | ICD-10-CM | POA: Diagnosis not present

## 2017-07-09 DIAGNOSIS — Z7982 Long term (current) use of aspirin: Secondary | ICD-10-CM | POA: Diagnosis not present

## 2017-07-09 DIAGNOSIS — I739 Peripheral vascular disease, unspecified: Secondary | ICD-10-CM | POA: Diagnosis present

## 2017-07-09 DIAGNOSIS — Z885 Allergy status to narcotic agent status: Secondary | ICD-10-CM

## 2017-07-09 LAB — BASIC METABOLIC PANEL
ANION GAP: 10 (ref 5–15)
BUN: 54 mg/dL — AB (ref 6–20)
CO2: 16 mmol/L — ABNORMAL LOW (ref 22–32)
Calcium: 10.1 mg/dL (ref 8.9–10.3)
Chloride: 114 mmol/L — ABNORMAL HIGH (ref 101–111)
Creatinine, Ser: 3.77 mg/dL — ABNORMAL HIGH (ref 0.44–1.00)
GFR calc Af Amer: 13 mL/min — ABNORMAL LOW (ref >60)
GFR, EST NON AFRICAN AMERICAN: 11 mL/min — AB (ref >60)
Glucose, Bld: 116 mg/dL — ABNORMAL HIGH (ref 65–99)
POTASSIUM: 4.9 mmol/L (ref 3.5–5.1)
SODIUM: 140 mmol/L (ref 135–145)

## 2017-07-09 LAB — CBC WITH DIFFERENTIAL/PLATELET
ABS IMMATURE GRANULOCYTES: 0 10*3/uL (ref 0.0–0.1)
BASOS ABS: 0 10*3/uL (ref 0.0–0.1)
Basophils Relative: 0 %
Eosinophils Absolute: 0 10*3/uL (ref 0.0–0.7)
Eosinophils Relative: 1 %
HCT: 30.2 % — ABNORMAL LOW (ref 36.0–46.0)
Hemoglobin: 9 g/dL — ABNORMAL LOW (ref 12.0–15.0)
IMMATURE GRANULOCYTES: 0 %
Lymphocytes Relative: 8 %
Lymphs Abs: 0.6 10*3/uL — ABNORMAL LOW (ref 0.7–4.0)
MCH: 28.5 pg (ref 26.0–34.0)
MCHC: 29.8 g/dL — ABNORMAL LOW (ref 30.0–36.0)
MCV: 95.6 fL (ref 78.0–100.0)
Monocytes Absolute: 0.6 10*3/uL (ref 0.1–1.0)
Monocytes Relative: 9 %
NEUTROS ABS: 5.7 10*3/uL (ref 1.7–7.7)
NEUTROS PCT: 82 %
PLATELETS: 119 10*3/uL — AB (ref 150–400)
RBC: 3.16 MIL/uL — AB (ref 3.87–5.11)
RDW: 15.8 % — ABNORMAL HIGH (ref 11.5–15.5)
WBC: 7 10*3/uL (ref 4.0–10.5)

## 2017-07-09 LAB — MAGNESIUM: Magnesium: 2.4 mg/dL (ref 1.7–2.4)

## 2017-07-09 LAB — TROPONIN I
TROPONIN I: 0.03 ng/mL — AB (ref <0.03)
TROPONIN I: 0.04 ng/mL — AB (ref <0.03)
Troponin I: 0.04 ng/mL (ref <0.03)
Troponin I: 0.04 ng/mL (ref <0.03)

## 2017-07-09 LAB — GLUCOSE, CAPILLARY
GLUCOSE-CAPILLARY: 171 mg/dL — AB (ref 65–99)
GLUCOSE-CAPILLARY: 89 mg/dL (ref 65–99)
GLUCOSE-CAPILLARY: 116 mg/dL — AB (ref 65–99)

## 2017-07-09 LAB — HEPARIN LEVEL (UNFRACTIONATED)
HEPARIN UNFRACTIONATED: 0.21 [IU]/mL — AB (ref 0.30–0.70)
Heparin Unfractionated: 0.56 IU/mL (ref 0.30–0.70)

## 2017-07-09 LAB — CBG MONITORING, ED: Glucose-Capillary: 94 mg/dL (ref 65–99)

## 2017-07-09 LAB — TSH: TSH: 2.513 u[IU]/mL (ref 0.350–4.500)

## 2017-07-09 LAB — BRAIN NATRIURETIC PEPTIDE: B Natriuretic Peptide: 1552.5 pg/mL — ABNORMAL HIGH (ref 0.0–100.0)

## 2017-07-09 MED ORDER — ACETAMINOPHEN 325 MG PO TABS
650.0000 mg | ORAL_TABLET | Freq: Four times a day (QID) | ORAL | Status: DC | PRN
  Administered 2017-07-11: 650 mg via ORAL
  Filled 2017-07-09: qty 2

## 2017-07-09 MED ORDER — GLECAPREVIR-PIBRENTASVIR 100-40 MG PO TABS
3.0000 | ORAL_TABLET | ORAL | Status: DC
  Administered 2017-07-09 – 2017-07-10 (×2): 3 via ORAL
  Administered 2017-07-11: 11:00:00 via ORAL
  Administered 2017-07-12: 3 via ORAL
  Filled 2017-07-09 (×4): qty 3

## 2017-07-09 MED ORDER — HEPARIN (PORCINE) IN NACL 100-0.45 UNIT/ML-% IJ SOLN
1000.0000 [IU]/h | INTRAMUSCULAR | Status: DC
Start: 2017-07-09 — End: 2017-07-11
  Administered 2017-07-09: 950 [IU]/h via INTRAVENOUS
  Administered 2017-07-10: 1000 [IU]/h via INTRAVENOUS
  Filled 2017-07-09 (×3): qty 250

## 2017-07-09 MED ORDER — CLOPIDOGREL BISULFATE 300 MG PO TABS
600.0000 mg | ORAL_TABLET | Freq: Once | ORAL | Status: AC
  Administered 2017-07-09: 600 mg via ORAL
  Filled 2017-07-09: qty 2

## 2017-07-09 MED ORDER — AMLODIPINE BESYLATE 10 MG PO TABS
10.0000 mg | ORAL_TABLET | Freq: Every day | ORAL | Status: DC
  Administered 2017-07-09 – 2017-07-10 (×2): 10 mg via ORAL
  Filled 2017-07-09: qty 2
  Filled 2017-07-09: qty 1

## 2017-07-09 MED ORDER — INSULIN ASPART 100 UNIT/ML ~~LOC~~ SOLN
0.0000 [IU] | Freq: Three times a day (TID) | SUBCUTANEOUS | Status: DC
  Administered 2017-07-09 – 2017-07-11 (×2): 2 [IU] via SUBCUTANEOUS

## 2017-07-09 MED ORDER — NITROGLYCERIN 2 % TD OINT
1.0000 [in_us] | TOPICAL_OINTMENT | Freq: Once | TRANSDERMAL | Status: AC
  Administered 2017-07-09: 1 [in_us] via TOPICAL
  Filled 2017-07-09: qty 1

## 2017-07-09 MED ORDER — FENOFIBRATE 54 MG PO TABS
54.0000 mg | ORAL_TABLET | Freq: Every day | ORAL | Status: DC
  Administered 2017-07-09 – 2017-07-12 (×4): 54 mg via ORAL
  Filled 2017-07-09 (×5): qty 1

## 2017-07-09 MED ORDER — ROSUVASTATIN CALCIUM 10 MG PO TABS
10.0000 mg | ORAL_TABLET | Freq: Every day | ORAL | Status: DC
  Administered 2017-07-09 – 2017-07-12 (×4): 10 mg via ORAL
  Filled 2017-07-09 (×5): qty 1

## 2017-07-09 MED ORDER — FUROSEMIDE 10 MG/ML IJ SOLN
40.0000 mg | Freq: Once | INTRAMUSCULAR | Status: AC
  Administered 2017-07-09: 40 mg via INTRAVENOUS
  Filled 2017-07-09: qty 4

## 2017-07-09 MED ORDER — CLONIDINE HCL 0.1 MG PO TABS
0.1000 mg | ORAL_TABLET | Freq: Two times a day (BID) | ORAL | Status: DC
  Administered 2017-07-09 – 2017-07-10 (×3): 0.1 mg via ORAL
  Filled 2017-07-09 (×3): qty 1

## 2017-07-09 MED ORDER — NITROGLYCERIN 0.4 MG SL SUBL: 0.4000 mg | SUBLINGUAL_TABLET | SUBLINGUAL | Status: DC | PRN

## 2017-07-09 MED ORDER — ONDANSETRON HCL 4 MG/2ML IJ SOLN: 4.0000 mg | Freq: Four times a day (QID) | INTRAMUSCULAR | Status: DC | PRN

## 2017-07-09 MED ORDER — ACETAMINOPHEN 650 MG RE SUPP: 650.0000 mg | Freq: Four times a day (QID) | RECTAL | Status: DC | PRN

## 2017-07-09 MED ORDER — ISOSORBIDE MONONITRATE ER 30 MG PO TB24
30.0000 mg | ORAL_TABLET | Freq: Every day | ORAL | Status: DC
  Administered 2017-07-09 – 2017-07-10 (×2): 30 mg via ORAL
  Filled 2017-07-09 (×2): qty 1

## 2017-07-09 MED ORDER — ASPIRIN EC 81 MG PO TBEC
81.0000 mg | DELAYED_RELEASE_TABLET | Freq: Every day | ORAL | Status: DC
  Administered 2017-07-09 – 2017-07-10 (×2): 81 mg via ORAL
  Filled 2017-07-09 (×2): qty 1

## 2017-07-09 MED ORDER — ONDANSETRON HCL 4 MG PO TABS: 4.0000 mg | ORAL_TABLET | Freq: Four times a day (QID) | ORAL | Status: DC | PRN

## 2017-07-09 MED ORDER — METOPROLOL SUCCINATE ER 25 MG PO TB24
25.0000 mg | ORAL_TABLET | Freq: Every day | ORAL | Status: DC
  Administered 2017-07-09 – 2017-07-10 (×2): 25 mg via ORAL
  Filled 2017-07-09 (×3): qty 1

## 2017-07-09 MED ORDER — CLOPIDOGREL BISULFATE 75 MG PO TABS
75.0000 mg | ORAL_TABLET | Freq: Every day | ORAL | Status: DC
  Administered 2017-07-09 – 2017-07-10 (×2): 75 mg via ORAL
  Filled 2017-07-09 (×2): qty 1

## 2017-07-09 NOTE — Progress Notes (Signed)
ANTICOAGULATION CONSULT NOTE - Follow Up Consult  Pharmacy Consult for Heparin Indication: chest pain/ACS  Allergies  Allergen Reactions  . Chlorhexidine Gluconate Itching  . Ace Inhibitors Cough  . Dilaudid [Hydromorphone Hcl] Itching    Patient Measurements: Height: 5\' 6"  (167.6 cm) Weight: 153 lb (69.4 kg) IBW/kg (Calculated) : 59.3 Heparin Dosing Weight: 69.4 kg  Vital Signs: BP: 141/56 (06/08 1000) Pulse Rate: 93 (06/08 1000)  Labs: Recent Labs    07/09/17 0324 07/09/17 0806 07/09/17 1206  HGB 9.0*  --   --   HCT 30.2*  --   --   PLT 119*  --   --   HEPARINUNFRC  --   --  0.21*  CREATININE 3.77*  --   --   TROPONINI 0.03* 0.04* 0.04*    Estimated Creatinine Clearance: 13 mL/min (A) (by C-G formula based on SCr of 3.77 mg/dL (H)).   Assessment: 72 year old female with known multi-vessel CAD not amendable to PCI or CABG who was recently discharge <10 days prior now readmitted for ACS. Hgb 9, Platelets 119.   Heparin level (drawn little early) is 0.21 on 950 units/hr. Had been previously therapeutic at this rate. CKD-CrCl ~ 13 ml/min.  Goal of Therapy:  Heparin level 0.3-0.7 units/ml Monitor platelets by anticoagulation protocol: Yes   Plan:  Increase Heparin to 1000 units/hr.  Recheck Heparin level in 8 hours.  Daily Heparin level and CBC.  Sloan Leiter, PharmD, BCPS, BCCCP Clinical Pharmacist Clinical phone 07/09/2017 until 3:30PM [phone] After hours, please call [phone] 07/09/2017,3:12 PM

## 2017-07-09 NOTE — Progress Notes (Signed)
[facility]                                                                              Patient Demographics  Kristen Bradley, is a 72 y.o. female, DOB - 06-23-1945, [place] date - 07/09/2017   Admitting Physician No admitting provider for patient encounter.  Outpatient Primary MD for the patient is [name], MD  Outpatient specialists:   LOS - 0  days   Medical records reviewed and are as summarized below:    Chief Complaint  Patient presents with  . Shortness of Breath       Brief summary   Patient is a 72 year old female with history of CAD, cardiac cath 01/20/2017, felt not to be candidate for any intervention, recently discharged from cardiology service on 5/29, admitted with CHF and NSTEMI, presented to ED with sudden onset of chest pain and shortness of breath.  Patient reported walking to the bathroom and on the way back having chest pressure radiating to neck with shortness of breath.  Chest x-ray showed pulmonary congestion and pleural effusion.  Cardiology was consulted and patient was admitted for unstable angina and acute on chronic CHF.  Assessment & Plan    Principal Problem:   Unstable angina (HCC)/chest pain in the setting of known obstructive CAD, ischemic cardiomyopathy, medically managed -Placed on Plavix, IV heparin, nitro patch.  Continue Toprol -Cardiology consulted, recommended repeat echocardiogram and serial cardiac enzymes -Continue Crestor, fenofibrate -Chest pain currently improving  Active Problems:   Acute on chronic systolic CHF (congestive heart failure) (HCC) -BNP 1552 at the time of admission -Patient received 40 mg IV Lasix x1 in ED, states significant improvement in her respiratory status since then -Continue strict I's and O's and daily weights, negative balance of 150 cc -Follow 2D echo, will follow cardiology recommendations regarding diuresis    Peripheral vascular disease ([place]) status post  CEA Continue aspirin, Plavix, statin    CKD (chronic kidney disease) stage 4, GFR 15-29 ml/min (HCC) -Not on ARB due to renal insufficiency, baseline creatinine 3-3.3 -Currently 3.7, follow closely    Type 2 diabetes mellitus with vascular disease (HCC) -CBG is currently stable, continue sliding scale insulin  Hypertension -Currently stable, continue Imdur, Norvasc, metoprolol, clonidine     Code Status: Partial okay for BiPAP, CPR, ACLS but no intubation DVT Prophylaxis: IV heparin Family Communication: Discussed in detail with the patient, all imaging results, lab results explained to the patient and family member at the bedside   Disposition Plan: When medically ready  Time Spent in minutes 54minutes  Procedures:  None Consultants:   Cardiology  Antimicrobials:   None   Medications  Scheduled Meds: . amLODipine  10 mg Oral Daily  . aspirin EC  81 mg Oral Daily  . cloNIDine  0.1 mg Oral BID  . [START ON 07/10/2017] clopidogrel  75 mg Oral Daily  . fenofibrate  54 mg Oral Daily  . Glecaprevir-Pibrentasvir  3 tablet Oral Daily  . insulin aspart  0-9 Units Subcutaneous TID WC  . isosorbide mononitrate  30 mg Oral Daily  . metoprolol succinate  25 mg Oral Daily  .  nitroGLYCERIN  1 inch Topical Once  . rosuvastatin  10 mg Oral Daily   Continuous Infusions: . heparin 950 Units/hr (07/09/17 0733)   PRN Meds:.acetaminophen **OR** acetaminophen, nitroGLYCERIN, ondansetron **OR** ondansetron (ZOFRAN) IV   Antibiotics   Anti-infectives (From admission, onward)   Start     Dose/Rate Route Frequency Ordered Stop   07/09/17 1000  Glecaprevir-Pibrentasvir 100-40 MG TABS 3 tablet     3 tablet Oral Daily 07/09/17 0735          Subjective:   Anaid Aust was seen and examined today.  Feeling better, chest pain much improved at the time of my examination, breathing better.  No fevers or chills or coughing. Patient denies dizziness,  abdominal pain, N/V/D/C, new  weakness, numbess, tingling.   Objective:   Vitals:   07/09/17 0737 07/09/17 0745 07/09/17 0800 07/09/17 0830  BP:   (!) 126/53 139/60  Pulse: 78 77 78 89  Resp: 20 (!) 22 (!) 22 (!) 25  Temp:      TempSrc:      SpO2: 94% 95% 94% 97%  Weight:      Height:        Intake/Output Summary (Last 24 hours) at 07/09/2017 0947 Last data filed at 07/09/2017 0546 Gross per 24 hour  Intake -  Output 150 ml  Net -150 ml     Wt Readings from Last 3 Encounters:  07/09/17 69.4 kg (153 lb)  06/29/17 68.3 kg (150 lb 9.6 oz)  06/08/17 69.9 kg (154 lb 3.2 oz)     Exam  General: Alert and oriented x 3, NAD  Eyes:   HEENT:  Atraumatic, normocephalic, + JVD  Cardiovascular: S1 S2 auscultated, Regular rate and rhythm.  Respiratory: Decreased breath sound at the bases otherwise no rhonchi or wheezing  Gastrointestinal: Soft, nontender, nondistended, + bowel sounds  Ext: no pedal edema bilaterally  Neuro: no new deficit  Musculoskeletal: No digital cyanosis, clubbing  Skin: No rashes  Psych: Normal affect and demeanor, alert and oriented x3    Data Reviewed:  I have personally reviewed following labs and imaging studies  Micro Results No results found for this or any previous visit (from the past 240 hour(s)).  Radiology Reports Dg Chest 2 View  Result Date: 07/09/2017 CLINICAL DATA:  Acute onset of shortness of breath with exertion and orthopnea. Decreased O2 saturation. EXAM: CHEST - 2 VIEW COMPARISON:  Chest radiograph performed March 21, 202019 FINDINGS: The lungs are well-aerated. Vascular congestion is noted. Increased interstitial markings raise concern for pulmonary edema. A small left pleural effusion is noted. There is no evidence of pneumothorax. The heart is mildly enlarged. A pacemaker/AICD is noted at the left chest wall, with leads ending at the right atrium and right ventricle. No acute osseous abnormalities are seen. Clips are noted within the right upper quadrant,  reflecting prior cholecystectomy. IMPRESSION: Vascular congestion and mild cardiomegaly. Increased interstitial markings raise concern for pulmonary edema. Small left pleural effusion noted. Electronically Signed   By: Garald Balding M.D.   On: 07/09/2017 03:34   Dg Chest 2 View  Result Date: 2020/01/2218 CLINICAL DATA:  Chest tightness and short of breath EXAM: CHEST - 2 VIEW COMPARISON:  11/16/2016 FINDINGS: The heart is moderately enlarged. Left subclavian AICD device is stable. Vascular congestion. Right basilar airspace disease. No pneumothorax. No pleural effusion. IMPRESSION: Right basilar airspace disease. Followup PA and lateral chest X-ray is recommended in 3-4 weeks following trial of antibiotic therapy to ensure resolution and exclude underlying malignancy.  Electronically Signed   By: Marybelle Killings M.D.   On: 09/01/202019 12:31   Nm Pet Image Initial (pi) Skull Base To Thigh  Result Date: 06/17/2017 CLINICAL DATA:  Initial treatment strategy for lung nodule. EXAM: NUCLEAR MEDICINE PET SKULL BASE TO THIGH TECHNIQUE: 8.0 mCi F-18 FDG was injected intravenously. Full-ring PET imaging was performed from the skull base to thigh after the radiotracer. CT data was obtained and used for attenuation correction and anatomic localization. Fasting blood glucose: 121 mg/dl COMPARISON:  Multiple exams, including CT scan 05/18/2017 and 12/31/2010 FINDINGS: Mediastinal blood pool activity: SUV max 2.6 Note: Misregistered CT and PET data in the head/neck region. I carefully reviewed the non attenuation corrected images. NECK: No significant abnormal hypermetabolic activity in this region. Incidental CT findings: Carotid atherosclerotic calcifications. CHEST: The right lower lobe nodule has indistinct margins, probably because it is blurred by motion artifact. This nodule measures approximately 1.0 by 0.6 cm on today's exam, with maximum standard uptake value of this indistinct density 1.2. Incidental CT findings:  Right anterior subpleural reticulation along the right middle lobe without hypermetabolic activity, compatible with prior radiation therapy port. Coronary, aortic arch, and branch vessel atherosclerotic vascular disease. Chronically stable saccular outpouching of the aortic arch measuring approximately 1.7 by 1.1 cm on image 54/4, similar to that shown on prior chest CT. Mild-to-moderate cardiomegaly. Pacer/AICD device noted. 0.7 cm right paratracheal lymph node on image 54/4. No hypermetabolic nodal activity in the chest. ABDOMEN/PELVIS: No significant abnormal hypermetabolic activity in this region. Incidental CT findings: Postoperative findings along the pancreatic tail and adjacent to the spleen. Aortoiliac atherosclerotic vascular disease. Bilateral renal lesions of varying complexity and density; no obvious lesions hypermetabolic to the renal parenchyma but this does not necessarily exclude small renal neoplasm. Infrarenal abdominal aortic aneurysm measuring up to 3.8 cm obliquely, slight saccular component, not changed from previous. No pathologic adenopathy. Descending and sigmoid colon diverticulosis. There continues to be focal accentuated density adjacent to the sigmoid colon measuring 2.6 by 1.6 cm on image 152/4. This has low-grade accentuated metabolic activity with maximum SUV 3.6. This has a slightly and slightly tethering appearance on multiplanar imaging. SKELETON: No significant abnormal hypermetabolic activity in this region. Incidental CT findings: Degenerative glenohumeral arthropathy on the left. Lumbar spondylosis and degenerative disc disease at L3-4 and L4-5. IMPRESSION: 1. The right lower lobe nodule is blurred by motion artifact and has slightly indistinct nodules but measures about 1.0 by 0.6. Maximum standard uptake value is 1.2. Although the maximum SUV is somewhat reassuring, low-grade adenocarcinoma is not excluded, and the probable increase in size compared to 2015 is still  concerning. At a minimum, surveillance by chest CT is likely warranted. 2. Again observed is a bandlike density adjacent to the sigmoid colon in the upper pelvis with low-grade metabolic activity, maximum SUV 3.6. This has an appearance suggesting postinflammatory findings related to prior diverticulitis. Strictly speaking, a carcinoid tumor or local colon cancer could have a similar appearance, and I would recommend correlation with colon cancer screening and continued surveillance. 3. Other imaging findings of potential clinical significance: Aortic Atherosclerosis (ICD10-I70.0). Coronary atherosclerosis. Chronic saccular outpouching of the aortic arch. Aortic aneurysm NOS (ICD10-I71.9). Abdominal aortic aneurysm 3.6 cm at its widest. This is stable. Postoperative findings along the pancreatic tail and spleen. Mild-to-moderate cardiomegaly. Bilateral renal lesions of varying complexity, most likely to be cysts although not technically specific in some cases. Descending and sigmoid colon diverticulosis. Lumbar spondylosis and degenerative disc disease. Electronically Signed   By:  Van Clines M.D.   On: 06/17/2017 08:58    Lab Data:  CBC: Recent Labs  Lab 07/09/17 0324  WBC 7.0  NEUTROABS 5.7  HGB 9.0*  HCT 30.2*  MCV 95.6  PLT 390*   Basic Metabolic Panel: Recent Labs  Lab 07/09/17 0324  NA 140  K 4.9  CL 114*  CO2 16*  GLUCOSE 116*  BUN 54*  CREATININE 3.77*  CALCIUM 10.1   GFR: Estimated Creatinine Clearance: 13 mL/min (A) (by C-G formula based on SCr of 3.77 mg/dL (H)). Liver Function Tests: No results for input(s): AST, ALT, ALKPHOS, BILITOT, PROT, ALBUMIN in the last 168 hours. No results for input(s): LIPASE, AMYLASE in the last 168 hours. No results for input(s): AMMONIA in the last 168 hours. Coagulation Profile: No results for input(s): INR, PROTIME in the last 168 hours. Cardiac Enzymes: Recent Labs  Lab 07/09/17 0324  TROPONINI 0.03*   BNP (last 3  results) No results for input(s): PROBNP in the last 8760 hours. HbA1C: No results for input(s): HGBA1C in the last 72 hours. CBG: Recent Labs  Lab 07/09/17 0746  GLUCAP 94   Lipid Profile: No results for input(s): CHOL, HDL, LDLCALC, TRIG, CHOLHDL, LDLDIRECT in the last 72 hours. Thyroid Function Tests: No results for input(s): TSH, T4TOTAL, FREET4, T3FREE, THYROIDAB in the last 72 hours. Anemia Panel: No results for input(s): VITAMINB12, FOLATE, FERRITIN, TIBC, IRON, RETICCTPCT in the last 72 hours. Urine analysis:    Component Value Date/Time   COLORURINE YELLOW 02/08/2012 1100   APPEARANCEUR CLOUDY (A) 02/08/2012 1100   LABSPEC 1.020 02/08/2012 1100   PHURINE 5.0 02/08/2012 1100   GLUCOSEU NEGATIVE 02/08/2012 1100   HGBUR TRACE (A) 02/08/2012 1100   BILIRUBINUR SMALL (A) 02/08/2012 1100   KETONESUR NEGATIVE 02/08/2012 1100   PROTEINUR >300 (A) 02/08/2012 1100   UROBILINOGEN 1.0 02/08/2012 1100   NITRITE NEGATIVE 02/08/2012 1100   LEUKOCYTESUR NEGATIVE 02/08/2012 1100     Johnathon Mittal M.D. [facility] 07/09/2017, 9:47 AM  Pager: [phone] Between 7am to 7pm - call Pager - [phone]  After 7pm go to [email or web address] - password [email or web address]  Call night coverage person covering after 7pm

## 2017-07-09 NOTE — ED Notes (Signed)
Attempted report 

## 2017-07-09 NOTE — ED Notes (Signed)
Patient transported to X-ray 

## 2017-07-09 NOTE — H&P (Signed)
History and Physical    Kristen Bradley EXN:[id number] DOB: 07/22/1945 DOA: 07/09/2017  PCP: [name], MD  Patient coming from: Home.  Chief Complaint: Chest pain and shortness of breath.  HPI: Kristen Bradley is a 72 y.o. female with history of CAD last cardiac cath in December 2018 and at the time patient was felt not to be a candidate for any intervention who was recently admitted for CHF and non-ST elevation MI medically treated discharge on Jun 30, 2018 1910 days ago presents to the ER after patient started developing sudden onset of chest pain and shortness of breath last night.  Patient states she was going to the bed and had walked to the bathroom and on the way back patient started developing chest pressure radiating to her neck with shortness of breath.  Denies any fever chills or productive cough.  The pressure persisted and patient was brought to the ER.  ED Course: In the ER chest x-ray shows congestion and pleural effusion.  Opponent mildly elevated EKG shows paced rhythm.  On-call cardiology was consulted and patient admitted for unstable angina/non-ST elevation MI.  Patient per the report had not been taking her Plavix.  Cardiology recommended loading patient with Plavix and starting heparin.  Lasix 40 mg IV 1 dose was given for CHF.  Review of Systems: As per HPI, rest all negative.   Past Medical History:  Diagnosis Date  . Adrenal tumor 08/2007   surgery  . Cancer of right breast [facility])    s/p lumpectomy, XRT  . Carotid artery disease (HCC)    s/p bilateral CEA  . Hepatitis C    "got treatment for it" (01/29/2016)  . HFrEF (heart failure with reduced ejection fraction) ([place])   . High cholesterol    takes Lopid daily  . History of UTI    takes Diflucan dail--pt states no uti in a couple of yrs though  . Hypertension    takes Losartan,Amlodipine,HCTZ,and Atenolol daily and Clatarpres  . Ischemic cardiomyopathy    a. 11/2016 s/p MDT BiV ICD w/ his bundle pacing.    . Joint pain    legs  . Renal artery stenosis (HCC)    Right renal artery stent 05/31/06  . Stroke [facility]) Jan. 7, 2014  . Tobacco abuse   . Type II diabetes mellitus ([place])     Past Surgical History:  Procedure Laterality Date  . ABDOMINAL HYSTERECTOMY    . ADRENAL GLAND SURGERY  2009  . BIV ICD INSERTION CRT-D N/A 11/05/2016   Procedure: BIV ICD INSERTION CRT-D;  Surgeon: Evans Lance, MD;  Location: [place] CV [facility];  Service: Cardiovascular;  Laterality: N/A;  . BREAST LUMPECTOMY Right 2009  . CARDIAC CATHETERIZATION N/A 02/03/2016   Procedure: Right/Left Heart Cath and Coronary Angiography;  Surgeon: Burnell Blanks, MD;  Location: [place] [facility];  Service: Cardiovascular;  Laterality: N/A;  . ENDARTERECTOMY  01/05/2012   Procedure: ENDARTERECTOMY CAROTID;  Surgeon: Conrad Wildwood, MD;  Location: [place];  Service: Vascular;  Laterality: Right;  Right Carotid Artery Endarterectomy, right stump pressure, intraoperative ultrasound  . ENDARTERECTOMY  02/08/2012   Procedure: ENDARTERECTOMY CAROTID;  Surgeon: Conrad Coal, MD;  Location: [place];  Service: Vascular;  Laterality: Left;  . LUMBAR DISC SURGERY    . PATCH ANGIOPLASTY  02/08/2012   Procedure: PATCH ANGIOPLASTY;  Surgeon: Conrad Gaines, MD;  Location: [facility] OR;  Service: Vascular;  Laterality: Left;  Carotid artery patch angioplasty using  Vascu-Guard bovine patch 1cm x 6cm.  . RENAL ARTERY STENT Right 05/2006     reports that she quit smoking about 16 months ago. Her smoking use included cigarettes. She has a 26.50 pack-year smoking history. She has never used smokeless tobacco. She reports that she does not drink alcohol or use drugs.  Allergies  Allergen Reactions  . Chlorhexidine Gluconate Itching  . Ace Inhibitors Cough  . Dilaudid [Hydromorphone Hcl] Itching    Family History  Problem Relation Age of Onset  . Cancer Mother        ? type  . Cancer Father        ? type  . Diabetes Sister   . Hypertension Sister    . Diabetes Brother   . Hypertension Brother   . CAD Neg Hx     Prior to Admission medications   Medication Sig Start Date End Date Taking? Authorizing Provider  acetaminophen (TYLENOL) 500 MG tablet Take 1,000 mg by mouth every 6 (six) hours as needed for mild pain or headache.    Yes [provider]  amLODipine (NORVASC) 10 MG tablet Take 10 mg by mouth daily.    Yes [provider]  aspirin EC 81 MG tablet Take 81 mg by mouth daily.     Yes [provider]  cloNIDine (CATAPRES) 0.1 MG tablet Take 0.1 mg by mouth 2 (two) times daily.     Yes [provider]  fenofibrate (TRICOR) 48 MG tablet Take 1 tablet (48 mg total) by mouth daily. 05/03/17  Yes Evans Lance, MD  glipiZIDE (GLUCOTROL) 5 MG tablet Take 2.5 mg by mouth daily before breakfast.    Yes [provider]  IRON PO Take 1 tablet by mouth daily.   Yes [provider]  isosorbide mononitrate (IMDUR) 30 MG 24 hr tablet TAKE 1 TABLET BY MOUTH ONCE DAILY 05/24/17  Yes Burnell Blanks, MD  MAVYRET 100-40 MG TABS Take 3 tablets by mouth daily. 06/06/17  Yes [provider]  metoprolol succinate (TOPROL-XL) 25 MG 24 hr tablet TAKE 1 TABLET BY MOUTH ONCE DAILY 05/24/17  Yes Burnell Blanks, MD  nitroGLYCERIN (NITROSTAT) 0.4 MG SL tablet Place 1 tablet (0.4 mg total) under the tongue every 5 (five) minutes as needed for chest pain. 03/02/16  Yes Burnell Blanks, MD  rosuvastatin (CRESTOR) 10 MG tablet Take 1 tablet (10 mg total) by mouth daily. 06/30/17  Yes Kathyrn Drown D, NP    Physical Exam: Vitals:   07/09/17 0500 07/09/17 0530 07/09/17 0600 07/09/17 0722  BP: (!) 125/56 (!) 130/58 (!) 129/55   Pulse: 79 80 75 78  Resp: (!) 21   (!) 22  Temp:      TempSrc:      SpO2: 94% 92% 92% 92%  Weight:      Height:          Constitutional: Moderately built and nourished. Vitals:   07/09/17 0500 07/09/17 0530 07/09/17 0600 07/09/17 0722  BP: (!)  125/56 (!) 130/58 (!) 129/55   Pulse: 79 80 75 78  Resp: (!) 21   (!) 22  Temp:      TempSrc:      SpO2: 94% 92% 92% 92%  Weight:      Height:       Eyes: Anicteric no pallor. ENMT: No discharge from the ears eyes nose or mouth. Neck: Elevated JVD no mass felt. Respiratory: No rhonchi or crepitations. Cardiovascular: S1-S2 heard no murmurs  appreciated. Abdomen: Soft nontender bowel sounds present. Musculoskeletal: No edema. Skin: No rash. Neurologic: Alert awake oriented to time place and person.  Moves all extremities. Psychiatric: Appears normal per normal affect.   Labs on Admission: I have personally reviewed following labs and imaging studies  CBC: Recent Labs  Lab 07/09/17 0324  WBC 7.0  NEUTROABS 5.7  HGB 9.0*  HCT 30.2*  MCV 95.6  PLT 505*   Basic Metabolic Panel: Recent Labs  Lab 07/09/17 0324  NA 140  K 4.9  CL 114*  CO2 16*  GLUCOSE 116*  BUN 54*  CREATININE 3.77*  CALCIUM 10.1   GFR: Estimated Creatinine Clearance: 13 mL/min (A) (by C-G formula based on SCr of 3.77 mg/dL (H)). Liver Function Tests: No results for input(s): AST, ALT, ALKPHOS, BILITOT, PROT, ALBUMIN in the last 168 hours. No results for input(s): LIPASE, AMYLASE in the last 168 hours. No results for input(s): AMMONIA in the last 168 hours. Coagulation Profile: No results for input(s): INR, PROTIME in the last 168 hours. Cardiac Enzymes: Recent Labs  Lab 07/09/17 0324  TROPONINI 0.03*   BNP (last 3 results) No results for input(s): PROBNP in the last 8760 hours. HbA1C: No results for input(s): HGBA1C in the last 72 hours. CBG: No results for input(s): GLUCAP in the last 168 hours. Lipid Profile: No results for input(s): CHOL, HDL, LDLCALC, TRIG, CHOLHDL, LDLDIRECT in the last 72 hours. Thyroid Function Tests: No results for input(s): TSH, T4TOTAL, FREET4, T3FREE, THYROIDAB in the last 72 hours. Anemia Panel: No results for input(s): VITAMINB12, FOLATE, FERRITIN, TIBC,  IRON, RETICCTPCT in the last 72 hours. Urine analysis:    Component Value Date/Time   COLORURINE YELLOW 02/08/2012 1100   APPEARANCEUR CLOUDY (A) 02/08/2012 1100   LABSPEC 1.020 02/08/2012 1100   PHURINE 5.0 02/08/2012 1100   GLUCOSEU NEGATIVE 02/08/2012 1100   HGBUR TRACE (A) 02/08/2012 1100   BILIRUBINUR SMALL (A) 02/08/2012 1100   KETONESUR NEGATIVE 02/08/2012 1100   PROTEINUR >300 (A) 02/08/2012 1100   UROBILINOGEN 1.0 02/08/2012 1100   NITRITE NEGATIVE 02/08/2012 1100   LEUKOCYTESUR NEGATIVE 02/08/2012 1100   Sepsis Labs: @LABRCNTIP (procalcitonin:4,lacticidven:4) )No results found for this or any previous visit (from the past 240 hour(s)).   Radiological Exams on Admission: Dg Chest 2 View  Result Date: 07/09/2017 CLINICAL DATA:  Acute onset of shortness of breath with exertion and orthopnea. Decreased O2 saturation. EXAM: CHEST - 2 VIEW COMPARISON:  Chest radiograph performed 06-Jan-202019 FINDINGS: The lungs are well-aerated. Vascular congestion is noted. Increased interstitial markings raise concern for pulmonary edema. A small left pleural effusion is noted. There is no evidence of pneumothorax. The heart is mildly enlarged. A pacemaker/AICD is noted at the left chest wall, with leads ending at the right atrium and right ventricle. No acute osseous abnormalities are seen. Clips are noted within the right upper quadrant, reflecting prior cholecystectomy. IMPRESSION: Vascular congestion and mild cardiomegaly. Increased interstitial markings raise concern for pulmonary edema. Small left pleural effusion noted. Electronically Signed   By: Garald Balding M.D.   On: 07/09/2017 03:34    EKG: Independently reviewed.  Paced rhythm.  Assessment/Plan Principal Problem:   Unstable angina (HCC) Active Problems:   Peripheral vascular disease (HCC)   CKD (chronic kidney disease) stage 4, GFR 15-29 ml/min (HCC)   Type 2 diabetes mellitus with vascular disease (HCC)   Ischemic  cardiomyopathy   Acute CHF (congestive heart failure) ([place])    1. Unstable angina/non-ST elevation MI -appreciate cardiology consult.  Patient is placed on heparin Plavix loading dose followed by 75 g daily along with aspirin Imdur statins and fenofibrate.  Patient is also on metoprolol.  Cycle cardiac markers. 2. Acute systolic CHF last EF measured was 55 to 60% with grade 1 diastolic dysfunction in February 2019.  Lasix 40 mg IV 1 dose has been given.  Based on response and metabolic panel further doses.  Closely follow intake output and metabolic panel daily weights. 3. Diabetes mellitus type 2 placed on sliding scale coverage. 4. Chronic kidney disease stage IV -creatinine appears to be at baseline. 5. Anemia and thrombus cytopenia appears to be chronic.  Follow CBC. 6. Hypertension on clonidine Imdur Norvasc metoprolol.   DVT prophylaxis: Heparin. Code Status: DO NOT INTUBATE. Family Communication: Family at the bedside. Disposition Plan: Home. Consults called: Cardiology. Admission status: Inpatient.   Rise Patience MD [facility] Pager [phone].  If 7PM-7AM, please contact night-coverage [email or web address] Password [facility]  07/09/2017, 7:37 AM

## 2017-07-09 NOTE — ED Notes (Signed)
Ordered breakfast 

## 2017-07-09 NOTE — ED Notes (Signed)
Placed pt on 2L of O2

## 2017-07-09 NOTE — Progress Notes (Signed)
ANTICOAGULATION CONSULT NOTE - Initial Consult  Pharmacy Consult for Heparin  Indication: chest pain/ACS  Allergies  Allergen Reactions  . Chlorhexidine Gluconate Itching  . Ace Inhibitors Cough  . Dilaudid [Hydromorphone Hcl] Itching    Patient Measurements: Height: 5\' 6"  (167.6 cm) Weight: 153 lb (69.4 kg) IBW/kg (Calculated) : 59.3  Vital Signs: Temp: 98.2 F (36.8 C) (06/08 0138) Temp Source: Oral (06/08 0138) BP: 129/55 (06/08 0600) Pulse Rate: 75 (06/08 0600)  Labs: Recent Labs    07/09/17 0324  HGB 9.0*  HCT 30.2*  PLT 119*  CREATININE 3.77*  TROPONINI 0.03*    Estimated Creatinine Clearance: 13 mL/min (A) (by C-G formula based on SCr of 3.77 mg/dL (H)).   Medical History: Past Medical History:  Diagnosis Date  . Adrenal tumor 08/2007   surgery  . Cancer of right breast [facility])    s/p lumpectomy, XRT  . Carotid artery disease (HCC)    s/p bilateral CEA  . Hepatitis C    "got treatment for it" (01/29/2016)  . HFrEF (heart failure with reduced ejection fraction) ([place])   . High cholesterol    takes Lopid daily  . History of UTI    takes Diflucan dail--pt states no uti in a couple of yrs though  . Hypertension    takes Losartan,Amlodipine,HCTZ,and Atenolol daily and Clatarpres  . Ischemic cardiomyopathy    a. 11/2016 s/p MDT BiV ICD w/ his bundle pacing.  . Joint pain    legs  . Renal artery stenosis (HCC)    Right renal artery stent 05/31/06  . Stroke [facility]) Jan. 7, 2014  . Tobacco abuse   . Type II diabetes mellitus [facility])     Assessment: 72 y/o F with known multi-vessel CAD not amendable to PCI or CABG. Recent discharge <10 days ago. PTA meds reviewed. Hgb 9. Plts 119. Noted renal dysfunction.   Goal of Therapy:  Heparin level 0.3-0.7 units/ml Monitor platelets by anticoagulation protocol: Yes   Plan:  -Start heparin at 950 units/hr (previously therapeutic rate) -1500 HL -Daily CBC/HL -Monitor for bleeding  Narda Bonds 07/09/2017,6:41  AM

## 2017-07-09 NOTE — Consult Note (Addendum)
Cardiology Consultation:   Patient ID: [name]; [id number]; 1945/07/06   Admit date: 07/09/2017 Date of Consult: 07/09/2017  Referring Physician: Dr. Ezequiel [place] Primary Care Provider: [name], MD Primary Cardiologist: [name], MD  Primary Electrophysiologist:   Dr. Lovena Le   Patient Profile:   Kristen Bradley is a 72 y.o. female with a hx of CAD, ICM, ICD, chronic systolic HF with recovery of LVEF after CRT, Renal artery stenosis with stent, PAD, HTN, HLD, DM and tobacco abuse who is being seen today for the evaluation of sudden onset chest pain and shortness of breath.   History of Present Illness:    Kristen Bradley has a hx of CAD with prior MI, LBBB, ICM, ICD, chronic systolic HF, Renal artery stenosis with stent, PAD, HTN, HLD, DM and tobacco abuse.  Cardiac cath 02/03/16 with severe 3 vessel disease with severe calcification of all vessels and echo with EF 25%.  She was not felt to be a candidate for CABG or PCI.  She had BiV ICD (MDT) placed 11/05/16 with a His bundle lead placed and narrowing of her complex.  Her ARB was stopped by Nephrology and statin was stopped due to elevated LFTs on lipitor 80 daily.   Last echo 03/14/17 with improved EF to 55-60%, G1 DD, PA pk pressure 34 mmHg.  Also hx of AAA at 3.6 cm at widest and stable on recent PET scan.  Has seen pulmonary for enlarging Rt LL lung nodule. She was recently admitted with NSTEMI and acute heart failure with troponin peak of 4.1 and was medically managed.   Pt now presents to ER with more sudden onset of SOB and chest pain. She was discharged about one week ago for chest pain and resulting troponin elevation to 4.5. She was treated with heparin and IV nitro during that admission. She was not discharged on Plavix. She reports it was stopped during the admission rprior to that stating that it was related to her kidney function. She reports no angina with walking around the house over the past few days. She  does use 3-4 pillows at night but that hasn't significantly changed over the past week. She was walking to the bathroom this evening when she developed sudden onset central chest tightness that radiates to the neck. The pain was 7/10. It was associated with shortness of breath. It feels exactly similar to her presentation appox one week ago. She denies PND and lower extremity edema.     Past Medical History:  Diagnosis Date  . Adrenal tumor 08/2007   surgery  . Cancer of right breast [facility])    s/p lumpectomy, XRT  . Carotid artery disease (HCC)    s/p bilateral CEA  . Hepatitis C    "got treatment for it" (01/29/2016)  . HFrEF (heart failure with reduced ejection fraction) ([place])   . High cholesterol    takes Lopid daily  . History of UTI    takes Diflucan dail--pt states no uti in a couple of yrs though  . Hypertension    takes Losartan,Amlodipine,HCTZ,and Atenolol daily and Clatarpres  . Ischemic cardiomyopathy    a. 11/2016 s/p MDT BiV ICD w/ his bundle pacing.  . Joint pain    legs  . Renal artery stenosis (HCC)    Right renal artery stent 05/31/06  . Stroke [facility]) Jan. 7, 2014  . Tobacco abuse   . Type II diabetes mellitus ([place])     Past Surgical History:  Procedure Laterality Date  .  ABDOMINAL HYSTERECTOMY    . ADRENAL GLAND SURGERY  2009  . BIV ICD INSERTION CRT-D N/A 11/05/2016   Procedure: BIV ICD INSERTION CRT-D;  Surgeon: Evans Lance, MD;  Location: [place] CV LAB;  Service: Cardiovascular;  Laterality: N/A;  . BREAST LUMPECTOMY Right 2009  . CARDIAC CATHETERIZATION N/A 02/03/2016   Procedure: Right/Left Heart Cath and Coronary Angiography;  Surgeon: Burnell Blanks, MD;  Location: [place] CV LAB;  Service: Cardiovascular;  Laterality: N/A;  . ENDARTERECTOMY  01/05/2012   Procedure: ENDARTERECTOMY CAROTID;  Surgeon: Conrad Clarks, MD;  Location: [place];  Service: Vascular;  Laterality: Right;  Right Carotid Artery Endarterectomy, right stump pressure,  intraoperative ultrasound  . ENDARTERECTOMY  02/08/2012   Procedure: ENDARTERECTOMY CAROTID;  Surgeon: Conrad [place], MD;  Location: [place];  Service: Vascular;  Laterality: Left;  . LUMBAR DISC SURGERY    . PATCH ANGIOPLASTY  02/08/2012   Procedure: PATCH ANGIOPLASTY;  Surgeon: Conrad Glasgow, MD;  Location: [facility] OR;  Service: Vascular;  Laterality: Left;  Carotid artery patch angioplasty using Vascu-Guard bovine patch 1cm x 6cm.  . RENAL ARTERY STENT Right 05/2006     Home Medications:  Prior to Admission medications   Medication Sig Start Date End Date Taking? Authorizing Provider  acetaminophen (TYLENOL) 500 MG tablet Take 1,000 mg by mouth every 6 (six) hours as needed for mild pain or headache.     [provider]  ALPRAZolam Duanne Moron) 0.5 MG tablet Take 1 tablet 30-45 mins prior to PET scan. Patient not taking: Reported on 2020/09/1317 06/14/17   Marshell Garfinkel, MD  amLODipine (NORVASC) 10 MG tablet Take 10 mg by mouth daily.     [provider]  aspirin EC 81 MG tablet Take 81 mg by mouth daily.      [provider]  cloNIDine (CATAPRES) 0.1 MG tablet Take 0.1 mg by mouth 2 (two) times daily.      [provider]  fenofibrate (TRICOR) 48 MG tablet Take 1 tablet (48 mg total) by mouth daily. 05/03/17   Evans Lance, MD  glipiZIDE (GLUCOTROL) 5 MG tablet Take 2.5 mg by mouth daily before breakfast.     [provider]  IRON PO Take 1 tablet by mouth daily.    [provider]  isosorbide mononitrate (IMDUR) 30 MG 24 hr tablet TAKE 1 TABLET BY MOUTH ONCE DAILY 05/24/17   Burnell Blanks, MD  MAVYRET 100-40 MG TABS Take 3 tablets by mouth daily. 06/06/17   [provider]  metoprolol succinate (TOPROL-XL) 25 MG 24 hr tablet TAKE 1 TABLET BY MOUTH ONCE DAILY 05/24/17   Burnell Blanks, MD  nitroGLYCERIN (NITROSTAT) 0.4 MG SL tablet Place 1 tablet (0.4 mg total) under the tongue every 5 (five) minutes as needed for chest pain.  03/02/16   Burnell Blanks, MD  rosuvastatin (CRESTOR) 10 MG tablet Take 1 tablet (10 mg total) by mouth daily. 06/30/17   Kathyrn Drown D, NP    Inpatient Medications: Scheduled Meds: . furosemide  40 mg Intravenous Once    Allergies:    Allergies  Allergen Reactions  . Chlorhexidine Gluconate Itching  . Ace Inhibitors Cough  . Dilaudid [Hydromorphone Hcl] Itching    Social History:   Social History   Socioeconomic History  . Marital status: Divorced    Spouse name: Not on file  . Number of children: 1  . Years of education: Not on file  . Highest education  level: Not on file  Occupational History  . Occupation: Retired-truck Education administrator: RETIRED  Social Needs  . Financial resource strain: Not on file  . Food insecurity:    Worry: Not on file    Inability: Not on file  . Transportation needs:    Medical: Not on file    Non-medical: Not on file  Tobacco Use  . Smoking status: Former Smoker    Packs/day: 0.50    Years: 53.00    Pack years: 26.50    Types: Cigarettes    Last attempt to quit: 03/2016    Years since quitting: 1.3  . Smokeless tobacco: Never Used  Substance and Sexual Activity  . Alcohol use: No    Alcohol/week: 0.0 oz  . Drug use: No  . Sexual activity: Not on file  Lifestyle  . Physical activity:    Days per week: Not on file    Minutes per session: Not on file  . Stress: Not on file  Relationships  . Social connections:    Talks on phone: Not on file    Gets together: Not on file    Attends religious service: Not on file    Active member of club or organization: Not on file    Attends meetings of clubs or organizations: Not on file    Relationship status: Not on file  . Intimate partner violence:    Fear of current or ex partner: Not on file    Emotionally abused: Not on file    Physically abused: Not on file    Forced sexual activity: Not on file  Other Topics Concern  . Not on file  Social History Narrative  . Not  on file    Family History:   Family History  Problem Relation Age of Onset  . Cancer Mother        ? type  . Cancer Father        ? type  . Diabetes Sister   . Hypertension Sister   . Diabetes Brother   . Hypertension Brother   . CAD Neg Hx      Review of Systems: [y] = yes, [ ]  = no   . General: Weight gain [ ] ; Weight loss [ ] ; Anorexia [ ] ; Fatigue [ ] ; Fever [ ] ; Chills [ ] ; Weakness [ ]   . Cardiac: Chest pain/pressure Blue.Reese ]; Resting SOB Blue.Reese ]; Exertional SOB [ ] ; Orthopnea [ ] ; Pedal Edema [ ] ; Palpitations [ ] ; Syncope [ ] ; Presyncope [ ] ; Paroxysmal nocturnal dyspnea[ ]   . Pulmonary: Cough [ ] ; Wheezing[ ] ; Hemoptysis[ ] ; Sputum [ ] ; Snoring [ ]   . GI: Vomiting[ ] ; Dysphagia[ ] ; Melena[ ] ; Hematochezia [ ] ; Heartburn[ ] ; Abdominal pain [ ] ; Constipation [ ] ; Diarrhea [ ] ; BRBPR [ ]   . GU: Hematuria[ ] ; Dysuria [ ] ; Nocturia[ ]   . Vascular: Pain in legs with walking [ ] ; Pain in feet with lying flat [ ] ; Non-healing sores [ ] ; Stroke [ ] ; TIA [ ] ; Slurred speech [ ] ;  . Neuro: Headaches[ ] ; Vertigo[ ] ; Seizures[ ] ; Paresthesias[ ] ;Blurred vision [ ] ; Diplopia [ ] ; Vision changes [ ]   . Ortho/Skin: Arthritis [ ] ; Joint pain [ ] ; Muscle pain [ ] ; Joint swelling [ ] ; Back Pain [ ] ; Rash [ ]   . Psych: Depression[ ] ; Anxiety[ ]   . Heme: Bleeding problems [ ] ; Clotting disorders [ ] ; Anemia [ ]   . Endocrine: Diabetes [ ] ; Thyroid  dysfunction[ ]   Physical Exam/Data:   Vitals:   07/09/17 0330 07/09/17 0345 07/09/17 0400 07/09/17 0415  BP: (!) 143/73 131/65 (!) 143/70 134/69  Pulse: 76 74 74 93  Resp: (!) 22 (!) 21 (!) 21 (!) 23  Temp:      TempSrc:      SpO2: 93% 92% 91% 93%  Weight:      Height:       No intake or output data in the 24 hours ending 07/09/17 0449 Filed Weights   07/09/17 0202  Weight: 69.4 kg (153 lb)   Body mass index is 24.69 kg/m.  General:  Well nourished, well developed, in no acute distress HEENT: normal Lymph: no adenopathy Neck: no  JVD Endocrine:  No thryomegaly Vascular: No carotid bruits; FA pulses 2+ bilaterally without bruits  Cardiac:  normal S1, S2; RRR; no murmur Lungs:  Bibasilar rales Abd: soft, nontender, no hepatomegaly  Ext: no edema Musculoskeletal:  No deformities, BUE and BLE strength normal and equal Skin: warm and dry  Neuro:  CNs 2-12 intact, no focal abnormalities noted Psych:  Normal affect   EKG:  The EKG was personally reviewed and demonstrates:  Paced Telemetry:  Telemetry was personally reviewed and demonstrates:  paced  Relevant CV Studies: Echo 03/14/17  Study Conclusions  - Left ventricle: The cavity size was normal. Wall thickness was increased in a pattern of mild LVH. Systolic function was normal. The estimated ejection fraction was in the range of 55% to 60%. Doppler parameters are consistent with abnormal left ventricular relaxation (grade 1 diastolic dysfunction). - Pulmonary arteries: PA peak pressure: 34 mm Hg (S).   Cardiac cath 02/03/16:  Ost RCA to Prox RCA lesion, 60 %stenosed.  Mid RCA lesion, 80 %stenosed.  Dist RCA lesion, 60 %stenosed.  RPDA lesion, 99 %stenosed.  Ost LM lesion, 50 %stenosed.  Ost Cx to Prox Cx lesion, 70 %stenosed.  Ost 3rd Mrg to 3rd Mrg lesion, 99 %stenosed.  2nd Mrg lesion, 50 %stenosed.  Ost 2nd Mrg to 2nd Mrg lesion, 20 %stenosed.  Prox Cx to Mid Cx lesion, 20 %stenosed.  Mid LAD lesion, 90 %stenosed.  1st Diag lesion, 90 %stenosed.  Dist LAD lesion, 80 %stenosed.  LV end diastolic pressure is normal.  Mid LAD to Dist LAD lesion, 20 %stenosed.  Ost LAD to Prox LAD lesion, 20 %stenosed.  1. Severe calcific three vessel CAD 2. Normal filling pressures 3. Ischemic cardiomyopathy (LV function reduced by echo)  Recommendations: She has severe calcification in all three vessels. Her anatomy is not favorable for PCI. I will ask CT surgery to see her to discuss CABG  Laboratory Data:  Chemistry Recent Labs   Lab 07/09/17 0324  NA 140  K 4.9  CL 114*  CO2 16*  GLUCOSE 116*  BUN 54*  CREATININE 3.77*  CALCIUM 10.1  GFRNONAA 11*  GFRAA 13*  ANIONGAP 10    Hematology Recent Labs  Lab 07/09/17 0324  WBC 7.0  RBC 3.16*  HGB 9.0*  HCT 30.2*  MCV 95.6  MCH 28.5  MCHC 29.8*  RDW 15.8*  PLT PENDING   Cardiac Enzymes Recent Labs  Lab 07/09/17 0324  TROPONINI 0.03*   BNP Recent Labs  Lab 07/09/17 0324  BNP 1,552.5*     Radiology/Studies:  Dg Chest 2 View  Result Date: 07/09/2017 CLINICAL DATA:  Acute onset of shortness of breath with exertion and orthopnea. Decreased O2 saturation. EXAM: CHEST - 2 VIEW COMPARISON:  Chest radiograph performed 12-24-202019  FINDINGS: The lungs are well-aerated. Vascular congestion is noted. Increased interstitial markings raise concern for pulmonary edema. A small left pleural effusion is noted. There is no evidence of pneumothorax. The heart is mildly enlarged. A pacemaker/AICD is noted at the left chest wall, with leads ending at the right atrium and right ventricle. No acute osseous abnormalities are seen. Clips are noted within the right upper quadrant, reflecting prior cholecystectomy. IMPRESSION: Vascular congestion and mild cardiomegaly. Increased interstitial markings raise concern for pulmonary edema. Small left pleural effusion noted. Electronically Signed   By: Garald Balding M.D.   On: 07/09/2017 03:34    Assessment and Plan:   1. Unstable Angina with known multivessel obstructive coronary artery disease - Unclear why Plavix was stopped? (she denies falls, bleeding, affordability and was turned down for CABG) - I would suggest restarting Plavix with loading dose 600 mg and then daily at 75 mg - Start Heparin ACS protocol for suspected unstable angina  - Nitrates for chest pain (improved from 7 to 3 with Nitro patch)  - Continue Toprol and consider further up-titration - LDL at goal with current medications Crestor and fenofibrate (had  transaminase elevation with high intensity statin dosage) - Recommend repeat echocardiogram and serial troponin measurements  2. Acute on Chronic systolic heart failure with recovery of LVEF - Mild volume overload on exam this morning - Would get daily standing weights (discharge weight after diuresis last admission was 150 and current 153) - See response to IV Lasix 40  - Strict I/O's, low Na diet  3. Ischemic Cardiomyopathy  - Unfortunately, limited from revascularization options and left with medical therapy -   4. S/p CRT with His bundle lead and recovery of LVEF - Recent PPM check in April shows device working normally with 98% pacing  5. PAD s/p CEA - Continue ASA, Statin  6. CKD III/IV - Judicious monitoring of I/O's and daily BMP - Not on ARB due to progressive renal dysfunction  7. HTN - Slightly elevated today possibly related to slight volume overload  8. HLD - LDL at goal with Crestor 10 and Fenofibrate  9. DMII - Reasonably well controlled with last A1C    For questions or updates, please contact [place] Please consult [email or web address] for contact info under Cardiology/STEMI.   Signed, Roselyn Meier, MD  07/09/2017 4:49 AM

## 2017-07-09 NOTE — ED Notes (Signed)
Pt eating breakfast 

## 2017-07-09 NOTE — Progress Notes (Signed)
Attempted report 

## 2017-07-09 NOTE — ED Provider Notes (Addendum)
[facility] EMERGENCY DEPARTMENT Provider Note   CSN: [id number] Arrival date & time: 07/09/17  0138     History   Chief Complaint Chief Complaint  Patient presents with  . Shortness of Breath    HPI Kristen Bradley is a 72 y.o. female.   Patient brought in by EMS with acute onset of shortness of breath and chest tightness that onset while she was trying to walk to the bathroom this evening.  She took one nitroglycerin and is feeling better.  She was recently admitted for CHF exacerbation and STEMI in May.  She has not been on Lasix since due to kidney function.  She was found to be hypoxic on room air and placed on nonrebreather.  She denies chest pain feels that her shortness of breath has improved. Denies any change in her weight.  Denies any leg swelling.  States compliance with her medications.  No cough or fever.  history of CAD with MI, LBBB, ICM, ICD, chronic systolic heart failure, renal artery stenosis with stent placement, PAD, HTN, HLD, DM 2 tobacco abuse.  Cardiac cath 02/03/2016 revealed severe three-vessel disease with severe calcification of all vessels and subsequent echocardiogram with EF of 25%, mild MR.  She was not felt to be a candidate for CABG or PCI.   The history is provided by the patient and the EMS personnel.  Shortness of Breath  Associated symptoms include cough and leg swelling. Pertinent negatives include no fever, no chest pain, no vomiting, no abdominal pain and no rash.    Past Medical History:  Diagnosis Date  . Adrenal tumor 08/2007   surgery  . Cancer of right breast [facility])    s/p lumpectomy, XRT  . Carotid artery disease (HCC)    s/p bilateral CEA  . Hepatitis C    "got treatment for it" (01/29/2016)  . HFrEF (heart failure with reduced ejection fraction) ([place])   . High cholesterol    takes Lopid daily  . History of UTI    takes Diflucan dail--pt states no uti in a couple of yrs though  . Hypertension    takes  Losartan,Amlodipine,HCTZ,and Atenolol daily and Clatarpres  . Ischemic cardiomyopathy    a. 11/2016 s/p MDT BiV ICD w/ his bundle pacing.  . Joint pain    legs  . Renal artery stenosis (HCC)    Right renal artery stent 05/31/06  . Stroke [facility]) Jan. 7, 2014  . Tobacco abuse   . Type II diabetes mellitus [facility])     Patient Active Problem List   Diagnosis Date Noted  . AKI (acute kidney injury) ([place])   . Unstable angina ([place])   . Acute systolic HF (heart failure) ([place]) 2020-05-217  . Type II diabetes mellitus ([place])   . Tobacco abuse   . Renal artery stenosis ([place])   . Joint pain   . Ischemic cardiomyopathy   . History of UTI   . High cholesterol   . HFrEF (heart failure with reduced ejection fraction) ([place])   . Hepatitis C   . Carotid artery disease ([place])   . Cancer of right breast ([place])   . Chronic systolic (congestive) heart failure ([place]) 11/05/2016  . Dyspnea   . CKD (chronic kidney disease) stage 4, GFR 15-29 ml/min (HCC)   . CAD, multiple vessel   . Chronic systolic heart failure ([place]) 03/03/2016  . 3-vessel CAD - treated medically  02/26/2016  . Chronic ITP (idiopathic thrombocytopenia) (HCC) 02/24/2016  . Acute  renal failure superimposed on stage 3 chronic kidney disease ([place])   . Sinus bradycardia   . Renal failure syndrome   . Sinus pause   . NSTEMI (non-ST elevated myocardial infarction) ([place])   . Cardiomyopathy, ischemic   . LBBB- new 01/30/2016  . Elevated troponin I level 01/30/2016  . Thrombocytopenia- plts 81K 01/30/2016  . Elevated brain natriuretic peptide (BNP) level 01/30/2016  . Acute respiratory failure ([place]) 01/29/2016  . Community acquired pneumonia 01/29/2016  . Hypertension 01/29/2016  . Acute kidney injury ([place]) 01/29/2016  . Diabetes mellitus with complication ([place])   . Solitary pulmonary nodule 08/10/2013  . Atherosclerosis of native artery of extremity with intermittent claudication ([place]) 01/26/2013  . Pain in limb 09/08/2012  . Aftercare  following surgery of the circulatory system, [place] 05/26/2012  . Stroke ([place]) 02/08/2012  . Occlusion and stenosis of carotid artery without mention of cerebral infarction 12/17/2011  . PAD (peripheral artery disease) ([place]) 11/26/2011  . Peripheral vascular disease ([place]) 11/05/2011  . Intermittent claudication ([place]) 11/05/2011  . Carotid stenosis, bilateral-s/p Bilateral CEA 11/05/2011  . Adrenal tumor 08/02/2007    Past Surgical History:  Procedure Laterality Date  . ABDOMINAL HYSTERECTOMY    . ADRENAL GLAND SURGERY  2009  . BIV ICD INSERTION CRT-D N/A 11/05/2016   Procedure: BIV ICD INSERTION CRT-D;  Surgeon: Evans Lance, MD;  Location: [facility] CV LAB;  Service: Cardiovascular;  Laterality: N/A;  . BREAST LUMPECTOMY Right 2009  . CARDIAC CATHETERIZATION N/A 02/03/2016   Procedure: Right/Left Heart Cath and Coronary Angiography;  Surgeon: Burnell Blanks, MD;  Location: [facility] CV LAB;  Service: Cardiovascular;  Laterality: N/A;  . ENDARTERECTOMY  01/05/2012   Procedure: ENDARTERECTOMY CAROTID;  Surgeon: Conrad [place], MD;  Location: [facility];  Service: Vascular;  Laterality: Right;  Right Carotid Artery Endarterectomy, right stump pressure, intraoperative ultrasound  . ENDARTERECTOMY  02/08/2012   Procedure: ENDARTERECTOMY CAROTID;  Surgeon: Conrad [place], MD;  Location: [facility];  Service: Vascular;  Laterality: Left;  . LUMBAR DISC SURGERY    . PATCH ANGIOPLASTY  02/08/2012   Procedure: PATCH ANGIOPLASTY;  Surgeon: Conrad [place], MD;  Location[facility] OR;  Service: Vascular;  Laterality: Left;  Carotid artery patch angioplasty using Vascu-Guard bovine patch 1cm x 6cm.  . RENAL ARTERY STENT Right 05/2006     OB History   None      Home Medications    Prior to Admission medications   Medication Sig Start Date End Date Taking? Authorizing Provider  acetaminophen (TYLENOL) 500 MG tablet Take 1,000 mg by mouth every 6 (six) hours as needed for mild pain or headache.     [provider]  ALPRAZolam Duanne Moron) 0.5 MG tablet Take 1 tablet 30-45 mins prior to PET scan. Patient not taking: Reported on June 12, 202019 06/14/17   Marshell Garfinkel, MD  amLODipine (NORVASC) 10 MG tablet Take 10 mg by mouth daily.     [provider]  aspirin EC 81 MG tablet Take 81 mg by mouth daily.      [provider]  cloNIDine (CATAPRES) 0.1 MG tablet Take 0.1 mg by mouth 2 (two) times daily.      [provider]  fenofibrate (TRICOR) 48 MG tablet Take 1 tablet (48 mg total) by mouth daily. 05/03/17   Evans Lance, MD  glipiZIDE (GLUCOTROL) 5 MG tablet Take 2.5 mg by mouth daily before breakfast.     [provider]  IRON PO Take  1 tablet by mouth daily.    [provider]  isosorbide mononitrate (IMDUR) 30 MG 24 hr tablet TAKE 1 TABLET BY MOUTH ONCE DAILY 05/24/17   Burnell Blanks, MD  MAVYRET 100-40 MG TABS Take 3 tablets by mouth daily. 06/06/17   [provider]  metoprolol succinate (TOPROL-XL) 25 MG 24 hr tablet TAKE 1 TABLET BY MOUTH ONCE DAILY 05/24/17   Burnell Blanks, MD  nitroGLYCERIN (NITROSTAT) 0.4 MG SL tablet Place 1 tablet (0.4 mg total) under the tongue every 5 (five) minutes as needed for chest pain. 03/02/16   Burnell Blanks, MD  rosuvastatin (CRESTOR) 10 MG tablet Take 1 tablet (10 mg total) by mouth daily. 06/30/17   Tommie Raymond, NP    Family History Family History  Problem Relation Age of Onset  . Cancer Mother        ? type  . Cancer Father        ? type  . Diabetes Sister   . Hypertension Sister   . Diabetes Brother   . Hypertension Brother   . CAD Neg Hx     Social History Social History   Tobacco Use  . Smoking status: Former Smoker    Packs/day: 0.50    Years: 53.00    Pack years: 26.50    Types: Cigarettes    Last attempt to quit: 03/2016    Years since quitting: 1.3  . Smokeless tobacco: Never Used  Substance Use Topics  . Alcohol use: No    Alcohol/week: 0.0 oz    . Drug use: No     Allergies   Chlorhexidine gluconate; Ace inhibitors; and Dilaudid [hydromorphone hcl]   Review of Systems Review of Systems  Constitutional: Negative for activity change, appetite change and fever.  HENT: Negative for congestion and postnasal drip.   Eyes: Negative for visual disturbance.  Respiratory: Positive for cough, chest tightness and shortness of breath.   Cardiovascular: Positive for leg swelling. Negative for chest pain.  Gastrointestinal: Negative for abdominal pain, nausea and vomiting.  Genitourinary: Negative for dysuria, hematuria, vaginal bleeding and vaginal discharge.  Musculoskeletal: Negative for arthralgias and myalgias.  Skin: Negative for rash.  Neurological: Negative for dizziness, tremors and light-headedness.    all other systems are negative except as noted in the HPI and PMH.    Physical Exam Updated Vital Signs BP 130/74 (BP Location: Right Arm)   Pulse 91   Temp 98.2 F (36.8 C) (Oral)   Resp (!) 30   Ht 5\' 6"  (1.676 m)   Wt 69.4 kg (153 lb)   SpO2 100%   BMI 24.69 kg/m   Physical Exam  Constitutional: She is oriented to person, place, and time. She appears well-developed and well-nourished. No distress.  HENT:  Head: Normocephalic and atraumatic.  Mouth/Throat: Oropharynx is clear and moist. No oropharyngeal exudate.  Eyes: Pupils are equal, round, and reactive to light. Conjunctivae and EOM are normal.  Neck: Normal range of motion. Neck supple.  No meningismus.  Cardiovascular: Normal rate, regular rhythm, normal heart sounds and intact distal pulses.  No murmur heard. Pulmonary/Chest: Effort normal. No respiratory distress. She has rales.  Bibasilar crackles  Abdominal: Soft. There is no tenderness. There is no rebound and no guarding.  Musculoskeletal: Normal range of motion. She exhibits no edema or tenderness.  Neurological: She is alert and oriented to person, place, and time. No cranial nerve deficit. She  exhibits normal muscle tone. Coordination normal.  No ataxia  on finger to nose bilaterally. No pronator drift. 5/5 strength throughout. CN 2-12 intact.Equal grip strength. Sensation intact.   Skin: Skin is warm.  Psychiatric: She has a normal mood and affect. Her behavior is normal.  Nursing note and vitals reviewed.    ED Treatments / Results  Labs (all labs ordered are listed, but only abnormal results are displayed) Labs Reviewed  CBC WITH DIFFERENTIAL/PLATELET - Abnormal; Notable for the following components:      Result Value   RBC 3.16 (*)    Hemoglobin 9.0 (*)    HCT 30.2 (*)    MCHC 29.8 (*)    RDW 15.8 (*)    Platelets 119 (*)    Lymphs Abs 0.6 (*)    All other components within normal limits  BASIC METABOLIC PANEL - Abnormal; Notable for the following components:   Chloride 114 (*)    CO2 16 (*)    Glucose, Bld 116 (*)    BUN 54 (*)    Creatinine, Ser 3.77 (*)    GFR calc non Af Amer 11 (*)    GFR calc Af Amer 13 (*)    All other components within normal limits  TROPONIN I - Abnormal; Notable for the following components:   Troponin I 0.03 (*)    All other components within normal limits  BRAIN NATRIURETIC PEPTIDE - Abnormal; Notable for the following components:   B Natriuretic Peptide 1,552.5 (*)    All other components within normal limits  HEPARIN LEVEL (UNFRACTIONATED)    EKG EKG Interpretation  Date/Time:  Saturday July 09 2017 01:49:02 EDT Ventricular Rate:  76 PR Interval:  126 QRS Duration: 152 QT Interval:  470 QTC Calculation: 528 R Axis:   47 Text Interpretation:  Atrial-sensed ventricular-paced rhythm Abnormal ECG No significant change was found Confirmed by Ezequiel Essex [phone]) on 07/09/2017 2:51:55 AM   Radiology Dg Chest 2 View  Result Date: 07/09/2017 CLINICAL DATA:  Acute onset of shortness of breath with exertion and orthopnea. Decreased O2 saturation. EXAM: CHEST - 2 VIEW COMPARISON:  Chest radiograph performed 03-09-2017  FINDINGS: The lungs are well-aerated. Vascular congestion is noted. Increased interstitial markings raise concern for pulmonary edema. A small left pleural effusion is noted. There is no evidence of pneumothorax. The heart is mildly enlarged. A pacemaker/AICD is noted at the left chest wall, with leads ending at the right atrium and right ventricle. No acute osseous abnormalities are seen. Clips are noted within the right upper quadrant, reflecting prior cholecystectomy. IMPRESSION: Vascular congestion and mild cardiomegaly. Increased interstitial markings raise concern for pulmonary edema. Small left pleural effusion noted. Electronically Signed   By: Garald Balding M.D.   On: 07/09/2017 03:34    Procedures Procedures (including critical care time)  Medications Ordered in ED Medications - No data to display   Initial Impression / Assessment and Plan / ED Course  I have reviewed the triage vital signs and the nursing notes.  Pertinent labs & imaging results that were available during my care of the patient were reviewed by me and considered in my medical decision making (see chart for details).    Patient with known CAD and ischemic cardiopathy presenting with chest tightness and shortness of breath now resolved after nitroglycerin.  EKG is paced and unchanged  Patient with known CAD presenting with dyspnea with exertion and chest tightness.  Pills improved after nitroglycerin.  EKG is unchanged.  Troponin is minimally elevated  Creatinine is worse to 3.7.  Appears her  baseline is around 3.  Patient does appear volume overloaded and is given a dose of Lasix.  Concern for unstable angina.  Discussed with cardiology fellow Dr. Pasty Arch who will see patient and requests medical admission.  Admission discussed with Dr. Hal Hope.  CRITICAL CARE Performed by: Ezequiel Essex Total critical care time: 33 minutes Critical care time was exclusive of separately billable procedures and treating  other patients. Critical care was necessary to treat or prevent imminent or life-threatening deterioration. Critical care was time spent personally by me on the following activities: development of treatment plan with patient and/or surrogate as well as nursing, discussions with consultants, evaluation of patient's response to treatment, examination of patient, obtaining history from patient or surrogate, ordering and performing treatments and interventions, ordering and review of laboratory studies, ordering and review of radiographic studies, pulse oximetry and re-evaluation of patient's condition.   Final Clinical Impressions(s) / ED Diagnoses   Final diagnoses:  Unstable angina ([place])  Acute systolic congestive heart failure ([place])  AKI (acute kidney injury) [facility])    ED Discharge Orders    None       Ezequiel Essex, MD 07/09/17 5208    Ezequiel Essex, MD 07/19/17 0840

## 2017-07-09 NOTE — ED Triage Notes (Addendum)
Pt BIB EMS from home. Pt d/c'd on 6/5 after admission for MI on 5/27. Told to hold lasix. Around 2200 this evening, pt began feeling incr'd SOB with exertion and orthopnea. Per EMS, GFD found pt SpO2 82% on RA, and placed her on NRB at 10Lpm. Pt denies CP, but took 1 nitro at home, prior to EMS arrival, hoping to aid breathing.

## 2017-07-10 ENCOUNTER — Inpatient Hospital Stay (HOSPITAL_COMMUNITY): Payer: Medicare HMO

## 2017-07-10 DIAGNOSIS — I200 Unstable angina: Secondary | ICD-10-CM

## 2017-07-10 DIAGNOSIS — I361 Nonrheumatic tricuspid (valve) insufficiency: Secondary | ICD-10-CM

## 2017-07-10 LAB — ECHOCARDIOGRAM COMPLETE
Height: 66 in
Weight: 2416 oz

## 2017-07-10 LAB — GLUCOSE, CAPILLARY
GLUCOSE-CAPILLARY: 113 mg/dL — AB (ref 65–99)
GLUCOSE-CAPILLARY: 124 mg/dL — AB (ref 65–99)
Glucose-Capillary: 115 mg/dL — ABNORMAL HIGH (ref 65–99)
Glucose-Capillary: 122 mg/dL — ABNORMAL HIGH (ref 65–99)

## 2017-07-10 LAB — CBC
HCT: 27.4 % — ABNORMAL LOW (ref 36.0–46.0)
Hemoglobin: 8.3 g/dL — ABNORMAL LOW (ref 12.0–15.0)
MCH: 28.3 pg (ref 26.0–34.0)
MCHC: 30.3 g/dL (ref 30.0–36.0)
MCV: 93.5 fL (ref 78.0–100.0)
PLATELETS: 105 10*3/uL — AB (ref 150–400)
RBC: 2.93 MIL/uL — AB (ref 3.87–5.11)
RDW: 15.8 % — ABNORMAL HIGH (ref 11.5–15.5)
WBC: 3.5 10*3/uL — ABNORMAL LOW (ref 4.0–10.5)

## 2017-07-10 LAB — HEPARIN LEVEL (UNFRACTIONATED): Heparin Unfractionated: 0.65 IU/mL (ref 0.30–0.70)

## 2017-07-10 LAB — BASIC METABOLIC PANEL
Anion gap: 9 (ref 5–15)
BUN: 51 mg/dL — ABNORMAL HIGH (ref 6–20)
CO2: 17 mmol/L — AB (ref 22–32)
CREATININE: 3.76 mg/dL — AB (ref 0.44–1.00)
Calcium: 10.2 mg/dL (ref 8.9–10.3)
Chloride: 113 mmol/L — ABNORMAL HIGH (ref 101–111)
GFR calc non Af Amer: 11 mL/min — ABNORMAL LOW (ref >60)
GFR, EST AFRICAN AMERICAN: 13 mL/min — AB (ref >60)
Glucose, Bld: 108 mg/dL — ABNORMAL HIGH (ref 65–99)
Potassium: 4.5 mmol/L (ref 3.5–5.1)
Sodium: 139 mmol/L (ref 135–145)

## 2017-07-10 LAB — FERRITIN: Ferritin: 188 ng/mL (ref 11–307)

## 2017-07-10 MED ORDER — POLYETHYLENE GLYCOL 3350 17 G PO PACK
17.0000 g | PACK | Freq: Every day | ORAL | Status: DC | PRN
  Administered 2017-07-10: 17 g via ORAL
  Filled 2017-07-10: qty 1

## 2017-07-10 MED ORDER — CLOPIDOGREL BISULFATE 75 MG PO TABS
75.0000 mg | ORAL_TABLET | Freq: Every day | ORAL | Status: DC
  Administered 2017-07-11 – 2017-07-12 (×2): 75 mg via ORAL
  Filled 2017-07-10 (×2): qty 1

## 2017-07-10 MED ORDER — ISOSORBIDE MONONITRATE ER 60 MG PO TB24
60.0000 mg | ORAL_TABLET | Freq: Every day | ORAL | Status: DC
  Administered 2017-07-11 – 2017-07-12 (×2): 60 mg via ORAL
  Filled 2017-07-10 (×2): qty 1

## 2017-07-10 MED ORDER — METOPROLOL SUCCINATE ER 50 MG PO TB24
50.0000 mg | ORAL_TABLET | Freq: Every day | ORAL | Status: DC
  Administered 2017-07-11: 50 mg via ORAL
  Filled 2017-07-10: qty 1

## 2017-07-10 MED ORDER — ISOSORBIDE MONONITRATE ER 30 MG PO TB24
30.0000 mg | ORAL_TABLET | Freq: Once | ORAL | Status: AC
  Administered 2017-07-10: 30 mg via ORAL
  Filled 2017-07-10: qty 1

## 2017-07-10 NOTE — Progress Notes (Signed)
[place] for Heparin  Indication: chest pain/ACS  Allergies  Allergen Reactions  . Chlorhexidine Gluconate Itching  . Ace Inhibitors Cough  . Dilaudid [Hydromorphone Hcl] Itching    Patient Measurements: Height: 5\' 6"  (167.6 cm) Weight: 151 lb (68.5 kg) IBW/kg (Calculated) : 59.3  Vital Signs: Temp: 98.4 F (36.9 C) (06/08 2036) Temp Source: Oral (06/08 2036) BP: 130/64 (06/09 0439) Pulse Rate: 79 (06/09 0008)  Labs: Recent Labs    07/09/17 0324 07/09/17 0806 07/09/17 1206 07/09/17 1901 07/09/17 2321 07/10/17 0408  HGB 9.0*  --   --   --   --  8.3*  HCT 30.2*  --   --   --   --  27.4*  PLT 119*  --   --   --   --  105*  HEPARINUNFRC  --   --  0.21*  --  0.56 0.65  CREATININE 3.77*  --   --   --   --  3.76*  TROPONINI 0.03* 0.04* 0.04* 0.04*  --   --     Estimated Creatinine Clearance: 13 mL/min (A) (by C-G formula based on SCr of 3.76 mg/dL (H)).   Medical History: Past Medical History:  Diagnosis Date  . Adrenal tumor 08/2007   surgery  . Cancer of right breast [facility])    s/p lumpectomy, XRT  . Carotid artery disease (HCC)    s/p bilateral CEA  . Hepatitis C    "got treatment for it" (01/29/2016)  . HFrEF (heart failure with reduced ejection fraction) ([place])   . High cholesterol    takes Lopid daily  . History of UTI    takes Diflucan dail--pt states no uti in a couple of yrs though  . Hypertension    takes Losartan,Amlodipine,HCTZ,and Atenolol daily and Clatarpres  . Ischemic cardiomyopathy    a. 11/2016 s/p MDT BiV ICD w/ his bundle pacing.  . Joint pain    legs  . Renal artery stenosis (HCC)    Right renal artery stent 05/31/06  . Stroke [facility]) Jan. 7, 2014  . Tobacco abuse   . Type II diabetes mellitus [facility])     Assessment: 72 y/o F with known multi-vessel CAD not amendable to PCI or CABG. Recent discharge <10 days ago. PTA meds reviewed. Hemoglobin and platelets low, stable this morning. Heparin level  therapeutic at 0.65. Noted no concerns with heparin drip or bleeding overnight.   Goal of Therapy:  Heparin level 0.3-0.7 units/ml Monitor platelets by anticoagulation protocol: Yes   Plan:  -Continue heparin drip at 1000 units/hr -Daily CBC/HL -Monitor for bleeding  Jalene Mullet, Pharm.D. PGY1 Pharmacy Resident 07/10/2017 7:27 AM Phone: [phone]

## 2017-07-10 NOTE — Progress Notes (Signed)
  Echocardiogram 2D Echocardiogram has been performed.  Darlina Sicilian M 07/10/2017, 9:52 AM

## 2017-07-10 NOTE — Progress Notes (Signed)
[facility]                                                                              Patient Demographics  Kristen Bradley, is a 72 y.o. female, DOB - 1945/05/03, SWN:[id number]  Admit date - 07/09/2017   Admitting Physician Rise Patience, MD  Outpatient Primary MD for the patient is [name], MD  Outpatient specialists:   LOS - 1  days   Medical records reviewed and are as summarized below:    Chief Complaint  Patient presents with  . Shortness of Breath       Brief summary   Patient is a 72 year old female with history of CAD, cardiac cath 01/20/2017, felt not to be candidate for any intervention, recently discharged from cardiology service on 5/29, admitted with CHF and NSTEMI, presented to ED with sudden onset of chest pain and shortness of breath.  Patient reported walking to the bathroom and on the way back having chest pressure radiating to neck with shortness of breath.  Chest x-ray showed pulmonary congestion and pleural effusion.  Cardiology was consulted and patient was admitted for unstable angina and acute on chronic CHF.  Assessment & Plan    Principal Problem:   Unstable angina (HCC)/chest pain in the setting of known obstructive CAD, ischemic cardiomyopathy, medically managed -Continues to complain of exertional chest pain, patient was placed on Plavix, IV heparin and nitro patch, beta-blocker.  -Continue Crestor, fenofibrate -2D echo today showed EF of 20 to 25% with diffuse hypokinesis with disproportionately severe anteroapical and lateral hypokinesis, grade 2 diastolic dysfunction (prior 2D echo 2/19 had shown normal EF 50 to 55%)   Active Problems:   Acute on chronic systolic CHF (congestive heart failure) (HCC) -BNP 1552 at the time of admission -Patient received 40 mg IV Lasix x1 in ED, states significant improvement in her respiratory status since then -Negative balance of 2.2 L, weight down from 153 -> 151 lbs -EF down 20  to 25% with diffuse hypokinesis per echo today, will follow cardiology recommendations     Peripheral vascular disease ([place]) status post CEA Continue aspirin, Plavix, statin    CKD (chronic kidney disease) stage 4, GFR 15-29 ml/min (HCC) -Not on ARB due to renal insufficiency, baseline creatinine 3-3.3 -Creatinine stable at 3.7    Type 2 diabetes mellitus with vascular disease (HCC) -CBG is currently stable, continue sliding scale insulin  Hypertension -Currently stable, continue amlodipine, Imdur, Toprol-XL    Code Status: Partial okay for BiPAP, CPR, ACLS but no intubation DVT Prophylaxis: IV heparin Family Communication: Discussed in detail with the patient, all imaging results, lab results explained to the patient   Disposition Plan: When medically ready  Time Spent in minutes  25 minutes  Procedures:  2d echo:  Study Conclusions  - Left ventricle: The cavity size was mildly dilated. Wall   thickness was normal. Systolic function was severely reduced. The   estimated ejection fraction was in the range of 20% to 25%.   Diffuse hypokinesis with disproportionately severe anteroapical   and lateral hypokinesis. Wall motion suggests CAD in the   distribution of multiple vessels. Features  are consistent with a   pseudonormal left ventricular filling pattern, with concomitant   abnormal relaxation and increased filling pressure (grade 2   diastolic dysfunction). - Mitral valve: There was mild to moderate regurgitation directed   centrally. The acceleration rate of the regurgitant jet was   reduced, consistent with a low dP/dt. - Left atrium: The atrium was mildly dilated. - Tricuspid valve: There was mild-moderate regurgitation directed   centrally. - Pulmonary arteries: Systolic pressure was moderately increased.   PA peak pressure: 55 mm Hg (S). Impressions: - Unusually brisk systolic and diastolic flow is seen in the ostium   of the right coronary artery. Consider  coronary AV fistula.  Consultants:   Cardiology  Antimicrobials:   None   Medications  Scheduled Meds: . amLODipine  10 mg Oral Daily  . clopidogrel  75 mg Oral Daily  . fenofibrate  54 mg Oral Daily  . Glecaprevir-Pibrentasvir  3 tablet Oral Q24H  . insulin aspart  0-9 Units Subcutaneous TID WC  . [START ON 07/11/2017] isosorbide mononitrate  60 mg Oral Daily  . [START ON 07/11/2017] metoprolol succinate  50 mg Oral Daily  . rosuvastatin  10 mg Oral Daily   Continuous Infusions: . heparin 1,000 Units/hr (07/10/17 0835)   PRN Meds:.acetaminophen **OR** acetaminophen, nitroGLYCERIN, ondansetron **OR** ondansetron (ZOFRAN) IV   Antibiotics   Anti-infectives (From admission, onward)   Start     Dose/Rate Route Frequency Ordered Stop   07/09/17 1100  Glecaprevir-Pibrentasvir 100-40 MG TABS 3 tablet     3 tablet Oral Every 24 hours 07/09/17 0735          Subjective:   Kristen Bradley was seen and examined today. Still feeling some shortness of breath with chest pain on minimal exertion.  No fevers or chills or coughing. Patient denies dizziness,  abdominal pain, N/V/D/C, new weakness, numbess, tingling.   Objective:   Vitals:   07/10/17 0008 07/10/17 0439 07/10/17 0737 07/10/17 1135  BP: 127/73 130/64 (!) 122/91 (!) 121/92  Pulse: 79  75 75  Resp: (!) 26  18 18   Temp:   97.7 F (36.5 C) (!) 97.5 F (36.4 C)  TempSrc:   Oral Oral  SpO2: 100% 96% 96% 98%  Weight:  68.5 kg (151 lb)    Height:        Intake/Output Summary (Last 24 hours) at 07/10/2017 1312 Last data filed at 07/10/2017 1000 Gross per 24 hour  Intake 720 ml  Output 2300 ml  Net -1580 ml     Wt Readings from Last 3 Encounters:  07/10/17 68.5 kg (151 lb)  06/29/17 68.3 kg (150 lb 9.6 oz)  06/08/17 69.9 kg (154 lb 3.2 oz)     Exam   General: Alert and oriented x 3, NAD  Eyes:   HEENT:  Atraumatic, normocephalic  Cardiovascular: S1 S2 auscultated, 2/6 systolic murmur, RRR. No pedal edema  b/l  Respiratory: dec BS at bases   Gastrointestinal: Soft, nontender, nondistended, + bowel sounds  Ext: no pedal edema bilaterally  Neuro: no new deficit  Musculoskeletal: No digital cyanosis, clubbing  Skin: No rashes  Psych: Normal affect and demeanor, alert and oriented x3    Data Reviewed:  I have personally reviewed following labs and imaging studies  Micro Results No results found for this or any previous visit (from the past 240 hour(s)).  Radiology Reports Dg Chest 2 View  Result Date: 07/09/2017 CLINICAL DATA:  Acute onset of shortness of breath with exertion and  orthopnea. Decreased O2 saturation. EXAM: CHEST - 2 VIEW COMPARISON:  Chest radiograph performed 03-Dec-202019 FINDINGS: The lungs are well-aerated. Vascular congestion is noted. Increased interstitial markings raise concern for pulmonary edema. A small left pleural effusion is noted. There is no evidence of pneumothorax. The heart is mildly enlarged. A pacemaker/AICD is noted at the left chest wall, with leads ending at the right atrium and right ventricle. No acute osseous abnormalities are seen. Clips are noted within the right upper quadrant, reflecting prior cholecystectomy. IMPRESSION: Vascular congestion and mild cardiomegaly. Increased interstitial markings raise concern for pulmonary edema. Small left pleural effusion noted. Electronically Signed   By: Garald Balding M.D.   On: 07/09/2017 03:34   Dg Chest 2 View  Result Date: 03/22/202019 CLINICAL DATA:  Chest tightness and short of breath EXAM: CHEST - 2 VIEW COMPARISON:  11/16/2016 FINDINGS: The heart is moderately enlarged. Left subclavian AICD device is stable. Vascular congestion. Right basilar airspace disease. No pneumothorax. No pleural effusion. IMPRESSION: Right basilar airspace disease. Followup PA and lateral chest X-ray is recommended in 3-4 weeks following trial of antibiotic therapy to ensure resolution and exclude underlying malignancy.  Electronically Signed   By: Marybelle Killings M.D.   On: 03-Dec-202019 12:31   Nm Pet Image Initial (pi) Skull Base To Thigh  Result Date: 06/17/2017 CLINICAL DATA:  Initial treatment strategy for lung nodule. EXAM: NUCLEAR MEDICINE PET SKULL BASE TO THIGH TECHNIQUE: 8.0 mCi F-18 FDG was injected intravenously. Full-ring PET imaging was performed from the skull base to thigh after the radiotracer. CT data was obtained and used for attenuation correction and anatomic localization. Fasting blood glucose: 121 mg/dl COMPARISON:  Multiple exams, including CT scan 05/18/2017 and 12/31/2010 FINDINGS: Mediastinal blood pool activity: SUV max 2.6 Note: Misregistered CT and PET data in the head/neck region. I carefully reviewed the non attenuation corrected images. NECK: No significant abnormal hypermetabolic activity in this region. Incidental CT findings: Carotid atherosclerotic calcifications. CHEST: The right lower lobe nodule has indistinct margins, probably because it is blurred by motion artifact. This nodule measures approximately 1.0 by 0.6 cm on today's exam, with maximum standard uptake value of this indistinct density 1.2. Incidental CT findings: Right anterior subpleural reticulation along the right middle lobe without hypermetabolic activity, compatible with prior radiation therapy port. Coronary, aortic arch, and branch vessel atherosclerotic vascular disease. Chronically stable saccular outpouching of the aortic arch measuring approximately 1.7 by 1.1 cm on image 54/4, similar to that shown on prior chest CT. Mild-to-moderate cardiomegaly. Pacer/AICD device noted. 0.7 cm right paratracheal lymph node on image 54/4. No hypermetabolic nodal activity in the chest. ABDOMEN/PELVIS: No significant abnormal hypermetabolic activity in this region. Incidental CT findings: Postoperative findings along the pancreatic tail and adjacent to the spleen. Aortoiliac atherosclerotic vascular disease. Bilateral renal lesions of  varying complexity and density; no obvious lesions hypermetabolic to the renal parenchyma but this does not necessarily exclude small renal neoplasm. Infrarenal abdominal aortic aneurysm measuring up to 3.8 cm obliquely, slight saccular component, not changed from previous. No pathologic adenopathy. Descending and sigmoid colon diverticulosis. There continues to be focal accentuated density adjacent to the sigmoid colon measuring 2.6 by 1.6 cm on image 152/4. This has low-grade accentuated metabolic activity with maximum SUV 3.6. This has a slightly and slightly tethering appearance on multiplanar imaging. SKELETON: No significant abnormal hypermetabolic activity in this region. Incidental CT findings: Degenerative glenohumeral arthropathy on the left. Lumbar spondylosis and degenerative disc disease at L3-4 and L4-5. IMPRESSION: 1. The right lower  lobe nodule is blurred by motion artifact and has slightly indistinct nodules but measures about 1.0 by 0.6. Maximum standard uptake value is 1.2. Although the maximum SUV is somewhat reassuring, low-grade adenocarcinoma is not excluded, and the probable increase in size compared to 2015 is still concerning. At a minimum, surveillance by chest CT is likely warranted. 2. Again observed is a bandlike density adjacent to the sigmoid colon in the upper pelvis with low-grade metabolic activity, maximum SUV 3.6. This has an appearance suggesting postinflammatory findings related to prior diverticulitis. Strictly speaking, a carcinoid tumor or local colon cancer could have a similar appearance, and I would recommend correlation with colon cancer screening and continued surveillance. 3. Other imaging findings of potential clinical significance: Aortic Atherosclerosis (ICD10-I70.0). Coronary atherosclerosis. Chronic saccular outpouching of the aortic arch. Aortic aneurysm NOS (ICD10-I71.9). Abdominal aortic aneurysm 3.6 cm at its widest. This is stable. Postoperative findings  along the pancreatic tail and spleen. Mild-to-moderate cardiomegaly. Bilateral renal lesions of varying complexity, most likely to be cysts although not technically specific in some cases. Descending and sigmoid colon diverticulosis. Lumbar spondylosis and degenerative disc disease. Electronically Signed   By: Van Clines M.D.   On: 06/17/2017 08:58    Lab Data:  CBC: Recent Labs  Lab 07/09/17 0324 07/10/17 0408  WBC 7.0 3.5*  NEUTROABS 5.7  --   HGB 9.0* 8.3*  HCT 30.2* 27.4*  MCV 95.6 93.5  PLT 119* 638*   Basic Metabolic Panel: Recent Labs  Lab 07/09/17 0324 07/09/17 0806 07/10/17 0408  NA 140  --  139  K 4.9  --  4.5  CL 114*  --  113*  CO2 16*  --  17*  GLUCOSE 116*  --  108*  BUN 54*  --  51*  CREATININE 3.77*  --  3.76*  CALCIUM 10.1  --  10.2  MG  --  2.4  --    GFR: Estimated Creatinine Clearance: 13 mL/min (A) (by C-G formula based on SCr of 3.76 mg/dL (H)). Liver Function Tests: No results for input(s): AST, ALT, ALKPHOS, BILITOT, PROT, ALBUMIN in the last 168 hours. No results for input(s): LIPASE, AMYLASE in the last 168 hours. No results for input(s): AMMONIA in the last 168 hours. Coagulation Profile: No results for input(s): INR, PROTIME in the last 168 hours. Cardiac Enzymes: Recent Labs  Lab 07/09/17 0324 07/09/17 0806 07/09/17 1206 07/09/17 1901  TROPONINI 0.03* 0.04* 0.04* 0.04*   BNP (last 3 results) No results for input(s): PROBNP in the last 8760 hours. HbA1C: No results for input(s): HGBA1C in the last 72 hours. CBG: Recent Labs  Lab 07/09/17 1125 07/09/17 1614 07/09/17 2035 07/10/17 0735 07/10/17 1134  GLUCAP 171* 89 116* 113* 115*   Lipid Profile: No results for input(s): CHOL, HDL, LDLCALC, TRIG, CHOLHDL, LDLDIRECT in the last 72 hours. Thyroid Function Tests: Recent Labs    07/09/17 0806  TSH 2.513   Anemia Panel: No results for input(s): VITAMINB12, FOLATE, FERRITIN, TIBC, IRON, RETICCTPCT in the last 72  hours. Urine analysis:    Component Value Date/Time   COLORURINE YELLOW 02/08/2012 1100   APPEARANCEUR CLOUDY (A) 02/08/2012 1100   LABSPEC 1.020 02/08/2012 1100   PHURINE 5.0 02/08/2012 1100   GLUCOSEU NEGATIVE 02/08/2012 1100   HGBUR TRACE (A) 02/08/2012 1100   BILIRUBINUR SMALL (A) 02/08/2012 1100   KETONESUR NEGATIVE 02/08/2012 1100   PROTEINUR >300 (A) 02/08/2012 1100   UROBILINOGEN 1.0 02/08/2012 1100   NITRITE NEGATIVE 02/08/2012 1100  LEUKOCYTESUR NEGATIVE 02/08/2012 1100     Rainee Sweatt M.D. [facility] 07/10/2017, 1:12 PM  Pager: [phone] Between 7am to 7pm - call Pager - [phone]  After 7pm go to [email or web address] - password [email or web address]  Call night coverage person covering after 7pm

## 2017-07-10 NOTE — Progress Notes (Signed)
[place] for Heparin  Indication: chest pain/ACS  Allergies  Allergen Reactions  . Chlorhexidine Gluconate Itching  . Ace Inhibitors Cough  . Dilaudid [Hydromorphone Hcl] Itching    Patient Measurements: Height: 5\' 6"  (167.6 cm) Weight: 153 lb (69.4 kg) IBW/kg (Calculated) : 59.3  Vital Signs: Temp: 98.4 F (36.9 C) (06/08 2036) Temp Source: Oral (06/08 2036) BP: 127/73 (06/09 0008) Pulse Rate: 79 (06/09 0008)  Labs: Recent Labs    07/09/17 0324 07/09/17 0806 07/09/17 1206 07/09/17 1901 07/09/17 2321  HGB 9.0*  --   --   --   --   HCT 30.2*  --   --   --   --   PLT 119*  --   --   --   --   HEPARINUNFRC  --   --  0.21*  --  0.56  CREATININE 3.77*  --   --   --   --   TROPONINI 0.03* 0.04* 0.04* 0.04*  --     Estimated Creatinine Clearance: 13 mL/min (A) (by C-G formula based on SCr of 3.77 mg/dL (H)).   Medical History: Past Medical History:  Diagnosis Date  . Adrenal tumor 08/2007   surgery  . Cancer of right breast [facility])    s/p lumpectomy, XRT  . Carotid artery disease (HCC)    s/p bilateral CEA  . Hepatitis C    "got treatment for it" (01/29/2016)  . HFrEF (heart failure with reduced ejection fraction) ([place])   . High cholesterol    takes Lopid daily  . History of UTI    takes Diflucan dail--pt states no uti in a couple of yrs though  . Hypertension    takes Losartan,Amlodipine,HCTZ,and Atenolol daily and Clatarpres  . Ischemic cardiomyopathy    a. 11/2016 s/p MDT BiV ICD w/ his bundle pacing.  . Joint pain    legs  . Renal artery stenosis (HCC)    Right renal artery stent 05/31/06  . Stroke [facility]) Jan. 7, 2014  . Tobacco abuse   . Type II diabetes mellitus [facility])     Assessment: 72 y/o F with known multi-vessel CAD not amendable to PCI or CABG. Recent discharge <10 days ago. PTA meds reviewed. Hgb 9. Plts 119. Noted renal dysfunction.   6/9 AM update: heparin level therapeutic x 1 after small rate  increase  Goal of Therapy:  Heparin level 0.3-0.7 units/ml Monitor platelets by anticoagulation protocol: Yes   Plan:  -Cont heparin at 1000 units/hr -Confirmatory heparin level with AM labs -Daily CBC/HL -Monitor for bleeding  Narda Bonds 07/10/2017,12:14 AM

## 2017-07-10 NOTE — Progress Notes (Addendum)
Progress Note  Patient Name: Kristen Bradley Date of Encounter: 07/10/2017  Primary Cardiologist: [name], MD  Subjective   Still having intermittent exertional chest pain and dyspnea when getting up to walk to the bathroom.  Breathing improved @ rest.  Inpatient Medications    Scheduled Meds: . amLODipine  10 mg Oral Daily  . aspirin EC  81 mg Oral Daily  . cloNIDine  0.1 mg Oral BID  . clopidogrel  75 mg Oral Daily  . fenofibrate  54 mg Oral Daily  . Glecaprevir-Pibrentasvir  3 tablet Oral Q24H  . insulin aspart  0-9 Units Subcutaneous TID WC  . isosorbide mononitrate  30 mg Oral Daily  . metoprolol succinate  25 mg Oral Daily  . rosuvastatin  10 mg Oral Daily   Continuous Infusions: . heparin 1,000 Units/hr (07/10/17 0835)   PRN Meds: acetaminophen **OR** acetaminophen, nitroGLYCERIN, ondansetron **OR** ondansetron (ZOFRAN) IV   Vital Signs    Vitals:   07/09/17 2124 07/10/17 0008 07/10/17 0439 07/10/17 0737  BP: (!) 151/74 127/73 130/64 (!) 122/91  Pulse:  79  75  Resp:  (!) 26  18  Temp:    97.7 F (36.5 C)  TempSrc:    Oral  SpO2:  100% 96% 96%  Weight:   151 lb (68.5 kg)   Height:        Intake/Output Summary (Last 24 hours) at 07/10/2017 1052 Last data filed at 07/10/2017 0600 Gross per 24 hour  Intake 762.28 ml  Output 2500 ml  Net -1737.72 ml   Filed Weights   07/09/17 0202 07/10/17 0439  Weight: 153 lb (69.4 kg) 151 lb (68.5 kg)    Physical Exam   GEN: Well nourished, well developed, in no acute distress.  HEENT: Grossly normal.  Neck: Supple, no JVD, loud L carotid bruit, no masses. Cardiac: RRR, 2/6 syst murmur @ LUSB, no rubs, or gallops. No clubbing, cyanosis, edema.  Radials/DP/PT 1+ and equal bilaterally.  Respiratory:  Respirations regular and unlabored, clear to auscultation bilaterally. GI: Soft, nontender, nondistended, BS + x 4. MS: no deformity or atrophy. Skin: warm and dry, no rash. Neuro:  Strength and sensation are  intact. Psych: AAOx3.  Normal affect.  Labs    Chemistry Recent Labs  Lab 07/09/17 0324 07/10/17 0408  NA 140 139  K 4.9 4.5  CL 114* 113*  CO2 16* 17*  GLUCOSE 116* 108*  BUN 54* 51*  CREATININE 3.77* 3.76*  CALCIUM 10.1 10.2  GFRNONAA 11* 11*  GFRAA 13* 13*  ANIONGAP 10 9     Hematology Recent Labs  Lab 07/09/17 0324 07/10/17 0408  WBC 7.0 3.5*  RBC 3.16* 2.93*  HGB 9.0* 8.3*  HCT 30.2* 27.4*  MCV 95.6 93.5  MCH 28.5 28.3  MCHC 29.8* 30.3  RDW 15.8* 15.8*  PLT 119* 105*    Cardiac Enzymes Recent Labs  Lab 07/09/17 0324 07/09/17 0806 07/09/17 1206 07/09/17 1901  TROPONINI 0.03* 0.04* 0.04* 0.04*      BNP Recent Labs  Lab 07/09/17 0324  BNP 1,552.5*     Radiology    Dg Chest 2 View  Result Date: 07/09/2017 CLINICAL DATA:  Acute onset of shortness of breath with exertion and orthopnea. Decreased O2 saturation. EXAM: CHEST - 2 VIEW COMPARISON:  Chest radiograph performed 11-28-202019 FINDINGS: The lungs are well-aerated. Vascular congestion is noted. Increased interstitial markings raise concern for pulmonary edema. A small left pleural effusion is noted. There is no evidence of pneumothorax.  The heart is mildly enlarged. A pacemaker/AICD is noted at the left chest wall, with leads ending at the right atrium and right ventricle. No acute osseous abnormalities are seen. Clips are noted within the right upper quadrant, reflecting prior cholecystectomy. IMPRESSION: Vascular congestion and mild cardiomegaly. Increased interstitial markings raise concern for pulmonary edema. Small left pleural effusion noted. Electronically Signed   By: Garald Balding M.D.   On: 07/09/2017 03:34    Telemetry    A sensed, V paced - Personally Reviewed  Cardiac Studies   Cardiac cath 02/03/16:  Ost RCA to Prox RCA lesion, 60 %stenosed.  Mid RCA lesion, 80 %stenosed.  Dist RCA lesion, 60 %stenosed.  RPDA lesion, 99 %stenosed.  Ost LM lesion, 50 %stenosed.  Ost Cx to  Prox Cx lesion, 70 %stenosed.  Ost 3rd Mrg to 3rd Mrg lesion, 99 %stenosed.  2nd Mrg lesion, 50 %stenosed.  Ost 2nd Mrg to 2nd Mrg lesion, 20 %stenosed.  Prox Cx to Mid Cx lesion, 20 %stenosed.  Mid LAD lesion, 90 %stenosed.  1st Diag lesion, 90 %stenosed.  Dist LAD lesion, 80 %stenosed.  LV end diastolic pressure is normal.  Mid LAD to Dist LAD lesion, 20 %stenosed.  Ost LAD to Prox LAD lesion, 20 %stenosed.  1. Severe calcific three vessel CAD 2. Normal filling pressures 3. Ischemic cardiomyopathy (LV function reduced by echo)  Recommendations: She has severe calcification in all three vessels. Her anatomy is not favorable for PCI. I will ask CT surgery to see her to discuss CABG _____________   Echo 03/14/17 Study Conclusions  - Left ventricle: The cavity size was normal. Wall thickness was increased in a pattern of mild LVH. Systolic function was normal. The estimated ejection fraction was in the range of 55% to 60%. Doppler parameters are consistent with abnormal left ventricular relaxation (grade 1 diastolic dysfunction). - Pulmonary arteries: PA peak pressure: 34 mm Hg (S). _____________    Patient Profile     Kristen Bradley is a 72 y.o. female with a hx of CAD, ICM, ICD, chronic systolic HF with recovery of LVEF after CRT, Renal artery stenosis with stent, PAD, HTN, HLD, DM and tobacco abusewho was readmitted 6/8 with sudden onset chest pain and shortness of breath.  Assessment & Plan    1.  Unstable angina with mild troponin elevation/CAD: Pt with known severe multivessel CAD by cath in 02/2016, not felt to be a candidate for PCI or CABG.  Most recently admitted in May with NSTEMI and trop to 4.5.  She was medically managed and d/c'd on  blocker, nitrate, ccb, statin, and asa therapy.  She developed recurrent exertional c/p and dyspnea on 6/7, prompting readmission.  Trops are mildly elevated @ 0.04 but trend is flat.  She cont to have exertional c/p  with minimal exertion in room.  HRs and BPs stable though progressive anemia likely playing a role.  Plavix was resumed on admission, but I am going to d/c in setting of anemia.  She says she was pending GI eval with Dr. Michail Sermon but it was delayed 2/2 cardiac issues.  She does have dark stools but also takes FeSO4 @ home.  I will titrate imdur to 60mg  daily.  With progressive renal failure, ranexa not ideal.  2.  Acute on chronic combined systolic and diastolic chf/ICM: Echo 02/930 with improvement in LV fxn to 55-60%, Gr1 DD. D/c wt 5/29 - 150 lbs. Admitted 6/8 @ 153 lbs, now 151.  Minus 2.3L overnight and 2.2L  for admission after receiving IV lasix following admission.  JVP flat this AM.  She is not on diuretic @ home.  Cont  blocker, nitrate.  BP stable  consider hydralazine for additional afterload reduction.  3.  PAD s/p CEA: patent by carotid u/s in 2015.  Prev followed by vasc surgery.  Cont asa, statin.  4.  CKD III/IV: stable since admission but worsened since 10/2016 when creat was 1.77 (was in 2.0 range prior to that).  She sees nephrology as outpt.  5.  Essential HTN: stable.  6.  HL:  LDL 70.  Cont statin.  7.  DMII: A1c 6.0 06/2017.  8.  Normocytic anemia:  Stable dating back to May but progressive since Sept 2018.  Likely contributing to angina in setting of severe 3VD.  Was planned for outpt colonoscopy with Dr. Michail Sermon but cancelled 2/2 heart issues earlier this year.  Will need to f/u.  Cont iron.   Signed, Murray Hodgkins, NP  07/10/2017, 10:52 AM    For questions or updates, please contact   Please consult [email or web address] for contact info under Cardiology/STEMI.  Seen and examined  Based on Cryptogenic Stroke data and [place], and with hx of ?GI blood loss, will switch fromASA>> clopidogrel  With negative enzymes, will discontinue heparin in a.m.  I am concerned about the degree of chest pain with exertion not withstanding flat troponins. We will check ferritin for her  anemia

## 2017-07-11 DIAGNOSIS — I255 Ischemic cardiomyopathy: Secondary | ICD-10-CM

## 2017-07-11 DIAGNOSIS — I5021 Acute systolic (congestive) heart failure: Secondary | ICD-10-CM

## 2017-07-11 DIAGNOSIS — N179 Acute kidney failure, unspecified: Secondary | ICD-10-CM

## 2017-07-11 LAB — GLUCOSE, CAPILLARY
GLUCOSE-CAPILLARY: 157 mg/dL — AB (ref 65–99)
GLUCOSE-CAPILLARY: 105 mg/dL — AB (ref 65–99)
Glucose-Capillary: 110 mg/dL — ABNORMAL HIGH (ref 65–99)
Glucose-Capillary: 118 mg/dL — ABNORMAL HIGH (ref 65–99)

## 2017-07-11 LAB — BASIC METABOLIC PANEL
ANION GAP: 7 (ref 5–15)
BUN: 53 mg/dL — AB (ref 6–20)
CHLORIDE: 114 mmol/L — AB (ref 101–111)
CO2: 18 mmol/L — AB (ref 22–32)
Calcium: 10.4 mg/dL — ABNORMAL HIGH (ref 8.9–10.3)
Creatinine, Ser: 3.74 mg/dL — ABNORMAL HIGH (ref 0.44–1.00)
GFR calc Af Amer: 13 mL/min — ABNORMAL LOW (ref >60)
GFR calc non Af Amer: 11 mL/min — ABNORMAL LOW (ref >60)
GLUCOSE: 114 mg/dL — AB (ref 65–99)
POTASSIUM: 4.9 mmol/L (ref 3.5–5.1)
Sodium: 139 mmol/L (ref 135–145)

## 2017-07-11 LAB — CBC
HEMATOCRIT: 28.7 % — AB (ref 36.0–46.0)
Hemoglobin: 8.8 g/dL — ABNORMAL LOW (ref 12.0–15.0)
MCH: 28.5 pg (ref 26.0–34.0)
MCHC: 30.7 g/dL (ref 30.0–36.0)
MCV: 92.9 fL (ref 78.0–100.0)
Platelets: 111 10*3/uL — ABNORMAL LOW (ref 150–400)
RBC: 3.09 MIL/uL — ABNORMAL LOW (ref 3.87–5.11)
RDW: 15.6 % — ABNORMAL HIGH (ref 11.5–15.5)
WBC: 4.3 10*3/uL (ref 4.0–10.5)

## 2017-07-11 LAB — HEPARIN LEVEL (UNFRACTIONATED): HEPARIN UNFRACTIONATED: 0.76 [IU]/mL — AB (ref 0.30–0.70)

## 2017-07-11 MED ORDER — FUROSEMIDE 80 MG PO TABS
80.0000 mg | ORAL_TABLET | Freq: Two times a day (BID) | ORAL | Status: DC
  Administered 2017-07-11 – 2017-07-12 (×2): 80 mg via ORAL
  Filled 2017-07-11 (×2): qty 1

## 2017-07-11 MED ORDER — BISACODYL 5 MG PO TBEC
5.0000 mg | DELAYED_RELEASE_TABLET | Freq: Every day | ORAL | Status: DC | PRN
  Administered 2017-07-11: 5 mg via ORAL
  Filled 2017-07-11: qty 1

## 2017-07-11 MED ORDER — HYDRALAZINE HCL 25 MG PO TABS
25.0000 mg | ORAL_TABLET | Freq: Three times a day (TID) | ORAL | Status: DC
  Administered 2017-07-11 – 2017-07-12 (×4): 25 mg via ORAL
  Filled 2017-07-11 (×5): qty 1

## 2017-07-11 MED ORDER — CARVEDILOL 6.25 MG PO TABS
6.2500 mg | ORAL_TABLET | Freq: Two times a day (BID) | ORAL | Status: DC
  Administered 2017-07-11 – 2017-07-12 (×2): 6.25 mg via ORAL
  Filled 2017-07-11 (×2): qty 1

## 2017-07-11 NOTE — Progress Notes (Signed)
Progress Note  Patient Name: Kristen Bradley Date of Encounter: 07/11/2017  Primary Cardiologist: [name], MD   Subjective   Still with some SOB and neck tightness with exertion, improved from admission. No CP or SOB at rest.  Inpatient Medications    Scheduled Meds: . amLODipine  10 mg Oral Daily  . clopidogrel  75 mg Oral Daily  . fenofibrate  54 mg Oral Daily  . Glecaprevir-Pibrentasvir  3 tablet Oral Q24H  . insulin aspart  0-9 Units Subcutaneous TID WC  . isosorbide mononitrate  60 mg Oral Daily  . metoprolol succinate  50 mg Oral Daily  . rosuvastatin  10 mg Oral Daily   Continuous Infusions: . heparin 1,000 Units/hr (07/10/17 0835)   PRN Meds: acetaminophen **OR** acetaminophen, nitroGLYCERIN, ondansetron **OR** ondansetron (ZOFRAN) IV, polyethylene glycol   Vital Signs    Vitals:   07/10/17 1608 07/10/17 2037 07/11/17 0318 07/11/17 0736  BP: (!) 124/57 130/61 (!) 149/73 (!) 130/57  Pulse: 72 75 78 77  Resp: 20 16  18   Temp: 98.2 F (36.8 C)  98 F (36.7 C) 98.4 F (36.9 C)  TempSrc: Oral  Oral Oral  SpO2: 98% 97% 99% 97%  Weight:   152 lb 6.4 oz (69.1 kg)   Height:        Intake/Output Summary (Last 24 hours) at 07/11/2017 0753 Last data filed at 07/11/2017 0600 Gross per 24 hour  Intake 1070 ml  Output 1500 ml  Net -430 ml   Filed Weights   07/09/17 0202 07/10/17 0439 07/11/17 0318  Weight: 153 lb (69.4 kg) 151 lb (68.5 kg) 152 lb 6.4 oz (69.1 kg)    Telemetry    Paced rhythm with occasional PVCs - Personally Reviewed  Physical Exam   GEN: Sitting upright in bed in no acute distress.   Neck: No JVD, no carotid bruits Cardiac: RRR, no murmurs, rubs, or gallops.  Respiratory: Clear to auscultation bilaterally, no wheezes/ rales/ rhonchi GI: NABS, Soft, nontender, non-distended  MS: No edema; No deformity. Neuro:  Nonfocal, moving all extremities spontaneously Psych: Normal affect   Labs    Chemistry Recent Labs  Lab  07/09/17 0324 07/10/17 0408 07/11/17 0629  NA 140 139 139  K 4.9 4.5 4.9  CL 114* 113* 114*  CO2 16* 17* 18*  GLUCOSE 116* 108* 114*  BUN 54* 51* 53*  CREATININE 3.77* 3.76* 3.74*  CALCIUM 10.1 10.2 10.4*  GFRNONAA 11* 11* 11*  GFRAA 13* 13* 13*  ANIONGAP 10 9 7      Hematology Recent Labs  Lab 07/09/17 0324 07/10/17 0408 07/11/17 0629  WBC 7.0 3.5* 4.3  RBC 3.16* 2.93* 3.09*  HGB 9.0* 8.3* 8.8*  HCT 30.2* 27.4* 28.7*  MCV 95.6 93.5 92.9  MCH 28.5 28.3 28.5  MCHC 29.8* 30.3 30.7  RDW 15.8* 15.8* 15.6*  PLT 119* 105* 111*    Cardiac Enzymes Recent Labs  Lab 07/09/17 0324 07/09/17 0806 07/09/17 1206 07/09/17 1901  TROPONINI 0.03* 0.04* 0.04* 0.04*   No results for input(s): TROPIPOC in the last 168 hours.   BNP Recent Labs  Lab 07/09/17 0324  BNP 1,552.5*     DDimer No results for input(s): DDIMER in the last 168 hours.   Radiology    No results found.  Cardiac Studies   Study Conclusions  - Left ventricle: The cavity size was mildly dilated. Wall   thickness was normal. Systolic function was severely reduced. The   estimated ejection fraction was  in the range of 20% to 25%.   Diffuse hypokinesis with disproportionately severe anteroapical   and lateral hypokinesis. Wall motion suggests CAD in the   distribution of multiple vessels. Features are consistent with a   pseudonormal left ventricular filling pattern, with concomitant   abnormal relaxation and increased filling pressure (grade 2   diastolic dysfunction). - Mitral valve: There was mild to moderate regurgitation directed   centrally. The acceleration rate of the regurgitant jet was   reduced, consistent with a low dP/dt. - Left atrium: The atrium was mildly dilated. - Tricuspid valve: There was mild-moderate regurgitation directed   centrally. - Pulmonary arteries: Systolic pressure was moderately increased.   PA peak pressure: 55 mm Hg (S).  Impressions:  - Unusually brisk  systolic and diastolic flow is seen in the ostium   of the right coronary artery. Consider coronary AV fistula.  Patient Profile     72 y.o. female with PMH of CAD, ICM, ICD, chronic combined CHF with recovery of LVEF after CRT, renal artery stenosis with stent, PAD, HTN, HLD, DM, and tobacco abuse who was readmitted 6/8 with sudden onset chest pain and SOB.   Assessment & Plan    1. Unstable angina in patient with CAD: known multivessel CAD on cath 02/2016 not felt to be a candidate for PCI or CABG. NSTEMI 06/2017 with trop peak 4.5. Medically managed on Bblocker, nitrate, CCB, statin, and ASA. She presented 6/7 with exertional CP and dyspnea. Trop 0.04, flat trend. Imdur was increased to 60mg  daily 6/9 and switched from ASA to plavix given stroke history and ?GI bleed. Echo yesterday with EF 20-25%, G2DD, and diffuse hypokinesis with disproportionately severe anteroapical and lateral hypokinesis concerning for progression of disease. - Continue plavix, bblocker, nitrate, and statin - Will discontinue CCB in the setting of reduced EF of 20-25% on Echo yesterday  2. Acute on chronic combined CHF: Echo this admission with EF 20-25% (previously 55-60% 03/2017), G2DD, diffuse hypokinesis with disproportionately severe anteroapical and lateral hypokinesis. She was diuresed with IV lasix x1 dose 6/8. UOP with net -436mL in the last 24hrs and -2.8L this admission. She continues to appear euvolemic.  - Continue BBlocker and nitrate - Will start low dose hydralazine TID today - Will discontinue CCB in the setting of newly reduced EF.   3.  PAD s/p CEA: patent by carotid u/s in 2015.  Prev followed by vasc surgery.  Cont asa, statin.  4.  CKD III/IV: stable since admission but worsened since 10/2016 when creat was 1.77 (was in 2.0 range prior to that).   - Continue to follow with nephrology as outpt.  5.  Essential HTN: stable. - Continue BBlocker and nitrate - Will discontinue CCB and start  hydralazine today  6.  HL:  LDL 70.  - Continue statin.  7.  DMII: A1c 6.0 06/2017. - Continue management per primary team  8.  Normocytic anemia:  Stable dating back to May but progressive since Sept 2018.  Likely contributing to angina in setting of severe 3VD.  Was planned for outpt colonoscopy with Dr. Michail Sermon but cancelled 2/2 heart issues earlier this year.  - Continue management per primary team and outpatient follow-up with GI - Continue po iron.    For questions or updates, please contact [place] Please consult [email or web address] for contact info under Cardiology/STEMI.      Signed, Abigail Butts, PA-C  07/11/2017, 7:53 AM   [phone]

## 2017-07-11 NOTE — Progress Notes (Signed)
   07/11/17 1500  Clinical Encounter Type  Visited With Patient;Patient and family together  Visit Type Initial  Referral From Nurse  Consult/Referral To Chaplain  Spiritual Encounters  Spiritual Needs Prayer;Emotional  Stress Factors  Patient Stress Factors Exhausted  Family Stress Factors Exhausted  Pt was laying on her bed watching TV. Pt's son and his wife on-site. Pt's son expressed concern for his mother being given lunch at wrong times. Chaplain apologized on behalf of kitchen management and had a reflective, empathic listening and compassionate presence.  Kiri Hinderliter a [place]

## 2017-07-11 NOTE — Plan of Care (Signed)
Ambulating in hall with ambulation specialist, compliant with use of call light to request assistance, verbalizes understanding of prescribed cardiac medications, call light within reach, family at bedside.  Edward Qualia RN

## 2017-07-11 NOTE — Consult Note (Signed)
Kristen Bradley Signs Admit Date: 07/09/2017 07/11/2017 Rexene Agent Requesting Physician:  Radford Pax MD  Reason for Consult:  Progressive CKD, Hypervolemia / AoC sCHF exacerbation HPI:  72 year old female seen at the request of Dr. Radford Pax for evaluation of the above.  Patient is seen with her son, Kristen Bradley.  PMH Incudes:  CAD with history of severe diffuse multivessel disease not amenable to PCI therapy or CABG; recent NSTEMI last month managed with education adjustment  Ischemic cardiomyopathy with history of systolic heart failure status post cardiac resynchronization therapy and improved LVEF; unfortunately recurrence of loss of ejection fraction recently  Peripheral vascular disease with history of carotid endarterectomy; history of renal artery stenosis with stenting  Chronic hepatitis C undergoing therapy  Tobacco user  Type 2 diabetes  Anemia  Hypertension  Hyperlipidemia  Patient was admitted on 6/8 from home when she presented with worsening angina and exertional dyspnea.  She also had PND.  Imaging was consistent with pulmonary edema/effusion, she received parenteral diuretics and has improved her respiratory status.  She continues to have some exertional symptoms and orthopnea.  Cardiology has been following closely, medications have been adjusted especially given recent finding of loss of ejection fraction.  She follows with Dr. Justin Mend at [facility].  She was seen last month.  At that time renal replacement therapies were broached and the patient responded stating that she would not receive any form of dialysis in the future.  When she was admitted to the hospital thereafter, she was seen by Dr. Jonnie Finner; again she confirmed plans for no dialysis.  Patient's kidney function has continued to worsen, creatinine is now increased to 3.7 whereas last month it was 3.3.  She has a mild normal anion gap metabolic acidosis.  Potassium is normal.  BUN is 53.  UOP has achieved net negative  3L.  Currently not on diuretics.  The patient and her son reviewed some materials related to dialysis and today tells me that she would be more open to receiving dialysis if it was the last possible option.  We discussed HD and PD.  In clear terms I expressed concerned that she would be a poor candidate for hemodialysis given her cardiac disease.  She potentially could do well on peritoneal dialysis.  She lives independently for the most part, does all of her finances, and has close family support nearby.   Creat (mg/dL)  Date Value  11/05/2011 0.82   Creatinine, Ser (mg/dL)  Date Value  07/11/2017 3.74 (H)  07/10/2017 3.76 (H)  07/09/2017 3.77 (H)  06/29/2017 3.37 (H)  06/28/2017 3.35 (H)  06/26/2017 3.00 (H)  Apr 08, 202019 3.45 (H)  10/26/2016 1.77 (H)  03/05/2016 2.06 (H)  03/04/2016 2.09 (H)  ] I/Os: I/O last 3 completed shifts: In: 26 [P.O.:960; I.V.:350] Out: 3100 [Urine:3100]   ROS Balance of 12 systems is negative w/ exceptions as above  PMH  Past Medical History:  Diagnosis Date  . Adrenal tumor 08/2007   surgery  . Cancer of right breast [facility])    s/p lumpectomy, XRT  . Carotid artery disease (HCC)    s/p bilateral CEA  . Hepatitis C    "got treatment for it" (01/29/2016)  . HFrEF (heart failure with reduced ejection fraction) ([place])   . High cholesterol    takes Lopid daily  . History of UTI    takes Diflucan dail--pt states no uti in a couple of yrs though  . Hypertension    takes Losartan,Amlodipine,HCTZ,and Atenolol daily and Clatarpres  .  Ischemic cardiomyopathy    a. 11/2016 s/p MDT BiV ICD w/ his bundle pacing.  . Joint pain    legs  . Renal artery stenosis (HCC)    Right renal artery stent 05/31/06  . Stroke [facility]) Jan. 7, 2014  . Tobacco abuse   . Type II diabetes mellitus (HCC)    PSH  Past Surgical History:  Procedure Laterality Date  . ABDOMINAL HYSTERECTOMY    . ADRENAL GLAND SURGERY  2009  . BIV ICD INSERTION CRT-D N/A 11/05/2016    Procedure: BIV ICD INSERTION CRT-D;  Surgeon: Evans Lance, MD;  Location: [place] [facility];  Service: Cardiovascular;  Laterality: N/A;  . BREAST LUMPECTOMY Right 2009  . CARDIAC CATHETERIZATION N/A 02/03/2016   Procedure: Right/Left Heart Cath and Coronary Angiography;  Surgeon: Burnell Blanks, MD;  Location: [place] [facility];  Service: Cardiovascular;  Laterality: N/A;  . ENDARTERECTOMY  01/05/2012   Procedure: ENDARTERECTOMY CAROTID;  Surgeon: Conrad [place], MD;  Location: [facility];  Service: Vascular;  Laterality: Right;  Right Carotid Artery Endarterectomy, right stump pressure, intraoperative ultrasound  . ENDARTERECTOMY  02/08/2012   Procedure: ENDARTERECTOMY CAROTID;  Surgeon: Conrad Maria Antonia, MD;  Location: [place];  Service: Vascular;  Laterality: Left;  . LUMBAR DISC SURGERY    . PATCH ANGIOPLASTY  02/08/2012   Procedure: PATCH ANGIOPLASTY;  Surgeon: Conrad Bromide, MD;  [facility];  Service: Vascular;  Laterality: Left;  Carotid artery patch angioplasty using Vascu-Guard bovine patch 1cm x 6cm.  . RENAL ARTERY STENT Right 05/2006   FH  Family History  Problem Relation Age of Onset  . Cancer Mother        ? type  . Cancer Father        ? type  . Diabetes Sister   . Hypertension Sister   . Diabetes Brother   . Hypertension Brother   . CAD Neg Hx    SH  reports that she quit smoking about 16 months ago. Her smoking use included cigarettes. She has a 26.50 pack-year smoking history. She has never used smokeless tobacco. She reports that she does not drink alcohol or use drugs. Allergies  Allergies  Allergen Reactions  . Chlorhexidine Gluconate Itching  . Ace Inhibitors Cough  . Dilaudid [Hydromorphone Hcl] Itching   Home medications Prior to Admission medications   Medication Sig Start Date End Date Taking? Authorizing Provider  acetaminophen (TYLENOL) 500 MG tablet Take 1,000 mg by mouth every 6 (six) hours as needed for mild pain or headache.    Yes [provider]  amLODipine (NORVASC) 10 MG tablet Take 10 mg by mouth daily.    Yes [provider]  aspirin EC 81 MG tablet Take 81 mg by mouth daily.     Yes [provider]  cloNIDine (CATAPRES) 0.1 MG tablet Take 0.1 mg by mouth 2 (two) times daily.     Yes [provider]  fenofibrate (TRICOR) 48 MG tablet Take 1 tablet (48 mg total) by mouth daily. 05/03/17  Yes Evans Lance, MD  glipiZIDE (GLUCOTROL) 5 MG tablet Take 2.5 mg by mouth daily before breakfast.    Yes [provider]  IRON PO Take 1 tablet by mouth daily.   Yes [provider]  isosorbide mononitrate (IMDUR) 30 MG 24 hr tablet TAKE 1 TABLET BY MOUTH ONCE DAILY 05/24/17  Yes Burnell Blanks, MD  MAVYRET 100-40 MG TABS Take 3 tablets by mouth daily.  06/06/17  Yes [provider]  metoprolol succinate (TOPROL-XL) 25 MG 24 hr tablet TAKE 1 TABLET BY MOUTH ONCE DAILY 05/24/17  Yes Burnell Blanks, MD  nitroGLYCERIN (NITROSTAT) 0.4 MG SL tablet Place 1 tablet (0.4 mg total) under the tongue every 5 (five) minutes as needed for chest pain. 03/02/16  Yes Burnell Blanks, MD  rosuvastatin (CRESTOR) 10 MG tablet Take 1 tablet (10 mg total) by mouth daily. 06/30/17  Yes Kathyrn Drown D, NP    Current Medications Scheduled Meds: . carvedilol  6.25 mg Oral BID WC  . clopidogrel  75 mg Oral Daily  . fenofibrate  54 mg Oral Daily  . Glecaprevir-Pibrentasvir  3 tablet Oral Q24H  . hydrALAZINE  25 mg Oral Q8H  . insulin aspart  0-9 Units Subcutaneous TID WC  . isosorbide mononitrate  60 mg Oral Daily  . rosuvastatin  10 mg Oral Daily   Continuous Infusions: PRN Meds:.acetaminophen **OR** acetaminophen, nitroGLYCERIN, ondansetron **OR** ondansetron (ZOFRAN) IV, polyethylene glycol  CBC Recent Labs  Lab 07/09/17 0324 07/10/17 0408 07/11/17 0629  WBC 7.0 3.5* 4.3  NEUTROABS 5.7  --   --   HGB 9.0* 8.3* 8.8*  HCT 30.2* 27.4* 28.7*  MCV 95.6 93.5 92.9  PLT  119* 105* 494*   Basic Metabolic Panel Recent Labs  Lab 07/09/17 0324 07/10/17 0408 07/11/17 0629  NA 140 139 139  K 4.9 4.5 4.9  CL 114* 113* 114*  CO2 16* 17* 18*  GLUCOSE 116* 108* 114*  BUN 54* 51* 53*  CREATININE 3.77* 3.76* 3.74*  CALCIUM 10.1 10.2 10.4*    Physical Exam  Blood pressure 140/64, pulse 74, temperature 98.1 F (36.7 C), temperature source Oral, resp. rate 18, height 5\' 6"  (1.676 m), weight 69.1 kg (152 lb 6.4 oz), SpO2 98 %. GEN: NAD lying in bed at 60eg ENT: NCAT  EYES: EOMI CV: RRR, normal S1 and S2 PULM: Bibasilar crackles present, normal work of breathing ABD: Soft, nontender SKIN: No rashes or lesions EXT: No lower extremity edema   Assessment 40F with progressive CKD5 likley cardiorenal in etiology; CAD / chronic angina, and chronic systolic CHF from ICM  1. Progressive CKD5, preseumed cardiorenal 2. ICM, AoC sCHF exacerbation; hx/o ICD/CRT 3. CAD, severe and diffuse; not CABG or PCI candidate, med mgmt 4. HTN 5. PAD, hx/o RAS ans stent, hx/o CEA 6. HCV on therpay 7. Chronic mild TCP 8. Mild NAG metabolic acidosis  Plan 1. Discussed dialysis modalities in great length.  I do not think that she would do very well with hemodialysis giving her significant/serious cardiovascular disease.  Potentially, she would do better with peritoneal dialysis done nightly.  She and her son seem interested in this. 2. At this point I will let them continue to consider and discuss.  I will revisit with them tomorrow.  If they wish to move forward on peritoneal dialysis will need close follow-up with Dr. Justin Mend as well as immediate steps to initiate the process of home visits and surgical evaluation; she should start sooner than later given her cardiac disease. 3. She probably does need daily diuretics, would start Lasix 80 mg p.o. twice daily 4. Would not provide sodium bicarbonates in the setting of heart failure and hypervolemia to treat her metabolic  acidosis   Pearson Grippe MD [phone] pgr 07/11/2017, 1:30 PM

## 2017-07-11 NOTE — Progress Notes (Signed)
[facility]                                                                              Patient Demographics  Kristen Bradley, is a 72 y.o. female, DOB - 1945-06-26, PTW:[id number]  Admit date - 07/09/2017   Admitting Physician Rise Patience, MD  Outpatient Primary MD for the patient is [name], MD  Outpatient specialists:   LOS - 2  days   Medical records reviewed and are as summarized below:    Chief Complaint  Patient presents with  . Shortness of Breath       Brief summary   Patient is a 72 year old female with history of CAD, cardiac cath 01/20/2017, felt not to be candidate for any intervention, recently discharged from cardiology service on 5/29, admitted with CHF and NSTEMI, presented to ED with sudden onset of chest pain and shortness of breath.  Patient reported walking to the bathroom and on the way back having chest pressure radiating to neck with shortness of breath.  Chest x-ray showed pulmonary congestion and pleural effusion.  Cardiology was consulted and patient was admitted for unstable angina and acute on chronic CHF.  Assessment & Plan    Principal Problem:   Unstable angina (HCC)/chest pain in the setting of known obstructive CAD, ischemic cardiomyopathy, medically managed -2D echo 6/9 showed EF of 20 to 25% with diffuse hypokinesis with disproportionately severe anteroapical and lateral hypokinesis, grade 2 diastolic dysfunction (prior 2D echo 2/19 had shown normal EF 50 to 55%)  -Continue Plavix, beta-blocker, nitrate and statin.  Active Problems:   Acute on chronic systolic CHF (congestive heart failure) (HCC) -BNP 1552 at the time of admission -Patient received 40 mg IV Lasix x1 in ED, states significant improvement in her respiratory status since then.  Negative balance of 3.2 L. -Negative balance of 2.2 L -Per cardiology, not a candidate for PCI/CABG, continue medical therapy. -CCB discontinued, started hydralazine,  continue nitrates.  Change to Coreg 6.25 mg twice a day.     Peripheral vascular disease ([place]) status post CEA Continue plavix, statin    CKD (chronic kidney disease) stage 4, GFR 15-29 ml/min (HCC) -Not on ARB due to renal insufficiency, baseline creatinine 3-3.3 -Nephrology consulted, was seen by renal during the previous admission, patient does not want dialysis and hence was not referred for access or dialysis education.  Follow-up outpatient with Dr. Justin Mend.  Creatinine has remained stable at 3.7    Type 2 diabetes mellitus with vascular disease ([place]) -Tina sliding scale insulin, CBGs stable  Hypertension -Currently stable, continue hydralazine, nitrate, Coreg Amlodipine discontinued  Anemia, normocytic Hemoglobin 8.8, was planned for outpatient colonoscopy however canceled due to cardiac issues, GI Dr. Michail Sermon Continue oral iron, ferritin 188 Transfuse for Hb < less than 8   Code Status: Partial okay for BiPAP, CPR, ACLS but no intubation DVT Prophylaxis: IV heparin Family Communication: Discussed in detail with the patient, all imaging results, lab results explained to the patient   Disposition Plan: When cleared by cardiology  Time Spent in minutes  25 minutes  Procedures:  2d echo:  Study Conclusions  - Left ventricle:  The cavity size was mildly dilated. Wall   thickness was normal. Systolic function was severely reduced. The   estimated ejection fraction was in the range of 20% to 25%.   Diffuse hypokinesis with disproportionately severe anteroapical   and lateral hypokinesis. Wall motion suggests CAD in the   distribution of multiple vessels. Features are consistent with a   pseudonormal left ventricular filling pattern, with concomitant   abnormal relaxation and increased filling pressure (grade 2   diastolic dysfunction). - Mitral valve: There was mild to moderate regurgitation directed   centrally. The acceleration rate of the regurgitant jet was   reduced,  consistent with a low dP/dt. - Left atrium: The atrium was mildly dilated. - Tricuspid valve: There was mild-moderate regurgitation directed   centrally. - Pulmonary arteries: Systolic pressure was moderately increased.   PA peak pressure: 55 mm Hg (S). Impressions: - Unusually brisk systolic and diastolic flow is seen in the ostium   of the right coronary artery. Consider coronary AV fistula.  Consultants:   Cardiology  Antimicrobials:   None   Medications  Scheduled Meds: . carvedilol  6.25 mg Oral BID WC  . clopidogrel  75 mg Oral Daily  . fenofibrate  54 mg Oral Daily  . Glecaprevir-Pibrentasvir  3 tablet Oral Q24H  . hydrALAZINE  25 mg Oral Q8H  . insulin aspart  0-9 Units Subcutaneous TID WC  . isosorbide mononitrate  60 mg Oral Daily  . rosuvastatin  10 mg Oral Daily   Continuous Infusions: . heparin Stopped (07/11/17 0906)   PRN Meds:.acetaminophen **OR** acetaminophen, nitroGLYCERIN, ondansetron **OR** ondansetron (ZOFRAN) IV, polyethylene glycol   Antibiotics   Anti-infectives (From admission, onward)   Start     Dose/Rate Route Frequency Ordered Stop   07/09/17 1100  Glecaprevir-Pibrentasvir 100-40 MG TABS 3 tablet     3 tablet Oral Every 24 hours 07/09/17 0735          Subjective:   Kristen Bradley was seen and examined today.  Still feels shortness of breath on exertion, no fevers or chills, nausea vomiting or abdominal pain.   Objective:   Vitals:   07/10/17 1608 07/10/17 2037 07/11/17 0318 07/11/17 0736  BP: (!) 124/57 130/61 (!) 149/73 (!) 130/57  Pulse: 72 75 78 77  Resp: 20 16  18   Temp: 98.2 F (36.8 C)  98 F (36.7 C) 98.4 F (36.9 C)  TempSrc: Oral  Oral Oral  SpO2: 98% 97% 99% 97%  Weight:   69.1 kg (152 lb 6.4 oz)   Height:        Intake/Output Summary (Last 24 hours) at 07/11/2017 1045 Last data filed at 07/11/2017 1000 Gross per 24 hour  Intake 1070 ml  Output 1700 ml  Net -630 ml     Wt Readings from Last 3 Encounters:    07/11/17 69.1 kg (152 lb 6.4 oz)  06/29/17 68.3 kg (150 lb 9.6 oz)  06/08/17 69.9 kg (154 lb 3.2 oz)     Exam   General: Alert and oriented x 3, NAD  Eyes:   HEENT:   Cardiovascular: S1 S2 auscultated. Regular rate and rhythm. No pedal edema b/l  Respiratory: Fairly CTA B  Gastrointestinal: Soft, nontender, nondistended, + bowel sounds  Ext: no pedal edema bilaterally  Neuro: no FND  Musculoskeletal: No digital cyanosis, clubbing  Skin: No rashes  Psych: Normal affect and demeanor, alert and oriented x3    Data Reviewed:  I have personally reviewed following labs and imaging  studies  Micro Results No results found for this or any previous visit (from the past 240 hour(s)).  Radiology Reports Dg Chest 2 View  Result Date: 07/09/2017 CLINICAL DATA:  Acute onset of shortness of breath with exertion and orthopnea. Decreased O2 saturation. EXAM: CHEST - 2 VIEW COMPARISON:  Chest radiograph performed Feb 24, 202019 FINDINGS: The lungs are well-aerated. Vascular congestion is noted. Increased interstitial markings raise concern for pulmonary edema. A small left pleural effusion is noted. There is no evidence of pneumothorax. The heart is mildly enlarged. A pacemaker/AICD is noted at the left chest wall, with leads ending at the right atrium and right ventricle. No acute osseous abnormalities are seen. Clips are noted within the right upper quadrant, reflecting prior cholecystectomy. IMPRESSION: Vascular congestion and mild cardiomegaly. Increased interstitial markings raise concern for pulmonary edema. Small left pleural effusion noted. Electronically Signed   By: Garald Balding M.D.   On: 07/09/2017 03:34   Dg Chest 2 View  Result Date: 03-10-202019 CLINICAL DATA:  Chest tightness and short of breath EXAM: CHEST - 2 VIEW COMPARISON:  11/16/2016 FINDINGS: The heart is moderately enlarged. Left subclavian AICD device is stable. Vascular congestion. Right basilar airspace disease. No  pneumothorax. No pleural effusion. IMPRESSION: Right basilar airspace disease. Followup PA and lateral chest X-ray is recommended in 3-4 weeks following trial of antibiotic therapy to ensure resolution and exclude underlying malignancy. Electronically Signed   By: Marybelle Killings M.D.   On: Feb 24, 202019 12:31   Nm Pet Image Initial (pi) Skull Base To Thigh  Result Date: 06/17/2017 CLINICAL DATA:  Initial treatment strategy for lung nodule. EXAM: NUCLEAR MEDICINE PET SKULL BASE TO THIGH TECHNIQUE: 8.0 mCi F-18 FDG was injected intravenously. Full-ring PET imaging was performed from the skull base to thigh after the radiotracer. CT data was obtained and used for attenuation correction and anatomic localization. Fasting blood glucose: 121 mg/dl COMPARISON:  Multiple exams, including CT scan 05/18/2017 and 12/31/2010 FINDINGS: Mediastinal blood pool activity: SUV max 2.6 Note: Misregistered CT and PET data in the head/neck region. I carefully reviewed the non attenuation corrected images. NECK: No significant abnormal hypermetabolic activity in this region. Incidental CT findings: Carotid atherosclerotic calcifications. CHEST: The right lower lobe nodule has indistinct margins, probably because it is blurred by motion artifact. This nodule measures approximately 1.0 by 0.6 cm on today's exam, with maximum standard uptake value of this indistinct density 1.2. Incidental CT findings: Right anterior subpleural reticulation along the right middle lobe without hypermetabolic activity, compatible with prior radiation therapy port. Coronary, aortic arch, and branch vessel atherosclerotic vascular disease. Chronically stable saccular outpouching of the aortic arch measuring approximately 1.7 by 1.1 cm on image 54/4, similar to that shown on prior chest CT. Mild-to-moderate cardiomegaly. Pacer/AICD device noted. 0.7 cm right paratracheal lymph node on image 54/4. No hypermetabolic nodal activity in the chest. ABDOMEN/PELVIS: No  significant abnormal hypermetabolic activity in this region. Incidental CT findings: Postoperative findings along the pancreatic tail and adjacent to the spleen. Aortoiliac atherosclerotic vascular disease. Bilateral renal lesions of varying complexity and density; no obvious lesions hypermetabolic to the renal parenchyma but this does not necessarily exclude small renal neoplasm. Infrarenal abdominal aortic aneurysm measuring up to 3.8 cm obliquely, slight saccular component, not changed from previous. No pathologic adenopathy. Descending and sigmoid colon diverticulosis. There continues to be focal accentuated density adjacent to the sigmoid colon measuring 2.6 by 1.6 cm on image 152/4. This has low-grade accentuated metabolic activity with maximum SUV 3.6. This has a  slightly and slightly tethering appearance on multiplanar imaging. SKELETON: No significant abnormal hypermetabolic activity in this region. Incidental CT findings: Degenerative glenohumeral arthropathy on the left. Lumbar spondylosis and degenerative disc disease at L3-4 and L4-5. IMPRESSION: 1. The right lower lobe nodule is blurred by motion artifact and has slightly indistinct nodules but measures about 1.0 by 0.6. Maximum standard uptake value is 1.2. Although the maximum SUV is somewhat reassuring, low-grade adenocarcinoma is not excluded, and the probable increase in size compared to 2015 is still concerning. At a minimum, surveillance by chest CT is likely warranted. 2. Again observed is a bandlike density adjacent to the sigmoid colon in the upper pelvis with low-grade metabolic activity, maximum SUV 3.6. This has an appearance suggesting postinflammatory findings related to prior diverticulitis. Strictly speaking, a carcinoid tumor or local colon cancer could have a similar appearance, and I would recommend correlation with colon cancer screening and continued surveillance. 3. Other imaging findings of potential clinical significance:  Aortic Atherosclerosis (ICD10-I70.0). Coronary atherosclerosis. Chronic saccular outpouching of the aortic arch. Aortic aneurysm NOS (ICD10-I71.9). Abdominal aortic aneurysm 3.6 cm at its widest. This is stable. Postoperative findings along the pancreatic tail and spleen. Mild-to-moderate cardiomegaly. Bilateral renal lesions of varying complexity, most likely to be cysts although not technically specific in some cases. Descending and sigmoid colon diverticulosis. Lumbar spondylosis and degenerative disc disease. Electronically Signed   By: Van Clines M.D.   On: 06/17/2017 08:58    Lab Data:  CBC: Recent Labs  Lab 07/09/17 0324 07/10/17 0408 07/11/17 0629  WBC 7.0 3.5* 4.3  NEUTROABS 5.7  --   --   HGB 9.0* 8.3* 8.8*  HCT 30.2* 27.4* 28.7*  MCV 95.6 93.5 92.9  PLT 119* 105* 130*   Basic Metabolic Panel: Recent Labs  Lab 07/09/17 0324 07/09/17 0806 07/10/17 0408 07/11/17 0629  NA 140  --  139 139  K 4.9  --  4.5 4.9  CL 114*  --  113* 114*  CO2 16*  --  17* 18*  GLUCOSE 116*  --  108* 114*  BUN 54*  --  51* 53*  CREATININE 3.77*  --  3.76* 3.74*  CALCIUM 10.1  --  10.2 10.4*  MG  --  2.4  --   --    GFR: Estimated Creatinine Clearance: 13.1 mL/min (A) (by C-G formula based on SCr of 3.74 mg/dL (H)). Liver Function Tests: No results for input(s): AST, ALT, ALKPHOS, BILITOT, PROT, ALBUMIN in the last 168 hours. No results for input(s): LIPASE, AMYLASE in the last 168 hours. No results for input(s): AMMONIA in the last 168 hours. Coagulation Profile: No results for input(s): INR, PROTIME in the last 168 hours. Cardiac Enzymes: Recent Labs  Lab 07/09/17 0324 07/09/17 0806 07/09/17 1206 07/09/17 1901  TROPONINI 0.03* 0.04* 0.04* 0.04*   BNP (last 3 results) No results for input(s): PROBNP in the last 8760 hours. HbA1C: No results for input(s): HGBA1C in the last 72 hours. CBG: Recent Labs  Lab 07/10/17 0735 07/10/17 1134 07/10/17 1605 07/10/17 2117  07/11/17 0735  GLUCAP 113* 115* 124* 122* 110*   Lipid Profile: No results for input(s): CHOL, HDL, LDLCALC, TRIG, CHOLHDL, LDLDIRECT in the last 72 hours. Thyroid Function Tests: Recent Labs    07/09/17 0806  TSH 2.513   Anemia Panel: Recent Labs    07/09/17 0806  FERRITIN 188   Urine analysis:    Component Value Date/Time   COLORURINE YELLOW 02/08/2012 1100   APPEARANCEUR CLOUDY (A)  02/08/2012 1100   LABSPEC 1.020 02/08/2012 1100   PHURINE 5.0 02/08/2012 1100   GLUCOSEU NEGATIVE 02/08/2012 1100   HGBUR TRACE (A) 02/08/2012 1100   BILIRUBINUR SMALL (A) 02/08/2012 1100   KETONESUR NEGATIVE 02/08/2012 1100   PROTEINUR >300 (A) 02/08/2012 1100   UROBILINOGEN 1.0 02/08/2012 1100   NITRITE NEGATIVE 02/08/2012 1100   LEUKOCYTESUR NEGATIVE 02/08/2012 1100     Ripudeep Rai M.D. [facility] 07/11/2017, 10:45 AM  Pager: [phone] Between 7am to 7pm - call Pager - [phone]  After 7pm go to [email or web address] - password [email or web address]  Call night coverage person covering after 7pm

## 2017-07-12 ENCOUNTER — Ambulatory Visit: Payer: Medicare HMO | Admitting: Cardiology

## 2017-07-12 ENCOUNTER — Telehealth: Payer: Self-pay | Admitting: Physician Assistant

## 2017-07-12 DIAGNOSIS — N184 Chronic kidney disease, stage 4 (severe): Secondary | ICD-10-CM

## 2017-07-12 LAB — GLUCOSE, CAPILLARY
GLUCOSE-CAPILLARY: 116 mg/dL — AB (ref 65–99)
Glucose-Capillary: 104 mg/dL — ABNORMAL HIGH (ref 65–99)

## 2017-07-12 LAB — CBC
HCT: 28 % — ABNORMAL LOW (ref 36.0–46.0)
HEMOGLOBIN: 8.7 g/dL — AB (ref 12.0–15.0)
MCH: 29 pg (ref 26.0–34.0)
MCHC: 31.1 g/dL (ref 30.0–36.0)
MCV: 93.3 fL (ref 78.0–100.0)
PLATELETS: 103 10*3/uL — AB (ref 150–400)
RBC: 3 MIL/uL — ABNORMAL LOW (ref 3.87–5.11)
RDW: 15.6 % — ABNORMAL HIGH (ref 11.5–15.5)
WBC: 4 10*3/uL (ref 4.0–10.5)

## 2017-07-12 LAB — BASIC METABOLIC PANEL
ANION GAP: 9 (ref 5–15)
BUN: 53 mg/dL — ABNORMAL HIGH (ref 6–20)
CO2: 17 mmol/L — ABNORMAL LOW (ref 22–32)
CREATININE: 3.76 mg/dL — AB (ref 0.44–1.00)
Calcium: 10.6 mg/dL — ABNORMAL HIGH (ref 8.9–10.3)
Chloride: 113 mmol/L — ABNORMAL HIGH (ref 101–111)
GFR, EST AFRICAN AMERICAN: 13 mL/min — AB (ref >60)
GFR, EST NON AFRICAN AMERICAN: 11 mL/min — AB (ref >60)
GLUCOSE: 106 mg/dL — AB (ref 65–99)
Potassium: 4.5 mmol/L (ref 3.5–5.1)
Sodium: 139 mmol/L (ref 135–145)

## 2017-07-12 MED ORDER — POLYETHYLENE GLYCOL 3350 17 G PO PACK: 17.0000 g | PACK | Freq: Every day | ORAL | 3 refills | Status: DC | PRN

## 2017-07-12 MED ORDER — CARVEDILOL 6.25 MG PO TABS: 6.2500 mg | ORAL_TABLET | Freq: Two times a day (BID) | ORAL | 3 refills | Status: DC

## 2017-07-12 MED ORDER — ISOSORBIDE MONONITRATE ER 60 MG PO TB24: 60.0000 mg | ORAL_TABLET | Freq: Every day | ORAL | 3 refills | Status: AC

## 2017-07-12 MED ORDER — MAGNESIUM CITRATE PO SOLN
1.0000 | Freq: Once | ORAL | Status: AC
  Administered 2017-07-12: 1 via ORAL
  Filled 2017-07-12: qty 296

## 2017-07-12 MED ORDER — CLOPIDOGREL BISULFATE 75 MG PO TABS
75.0000 mg | ORAL_TABLET | Freq: Every day | ORAL | 3 refills | Status: DC
Start: 2017-07-12 — End: 2018-02-15

## 2017-07-12 MED ORDER — FUROSEMIDE 80 MG PO TABS: 80.0000 mg | ORAL_TABLET | Freq: Two times a day (BID) | ORAL | 3 refills | Status: DC

## 2017-07-12 MED ORDER — HYDRALAZINE HCL 25 MG PO TABS: 25.0000 mg | ORAL_TABLET | Freq: Three times a day (TID) | ORAL | 3 refills | Status: DC

## 2017-07-12 NOTE — Evaluation (Signed)
Physical Therapy Evaluation Patient Details Name: Kristen Bradley MRN: [id number] DOB: Jul 07, 1945 Today's Date: 07/12/2017   History of Present Illness  pt is a 72 y/o female with h/o CAD, Cath12/20/18, recent admision for CHF and NSTEMI, admitted through ED with sudden onset of CP and SOB.  Clinical Impression  Pt is close to baseline functioning and should be safe at home, but I feel is just getting by and needs some follow up HHPT to get her better conditioned to be able to stay independent and stay out of the hospital. There are no further acute PT needs.  Will sign off at this time.     Follow Up Recommendations Home health PT;Other (comment)(pt very deconditioned for 72 y/o )    Equipment Recommendations  None recommended by PT    Recommendations for Other Services       Precautions / Restrictions Precautions Precautions: Fall Restrictions Weight Bearing Restrictions: No      Mobility  Bed Mobility Overal bed mobility: Modified Independent                Transfers Overall transfer level: Modified independent                  Ambulation/Gait Ambulation/Gait assistance: Supervision Ambulation Distance (Feet): 100 Feet Assistive device: None Gait Pattern/deviations: Step-through pattern Gait velocity: decreased Gait velocity interpretation: <1.31 ft/sec, indicative of household ambulator General Gait Details: pt is generally steady, but moves slow with no ability to increase speed to cuing and very limited in distance before legs get tight and sore.  Stairs Stairs: (pt deferred)          Wheelchair Mobility    Modified Rankin (Stroke Patients Only)       Balance Overall balance assessment: No apparent balance deficits (not formally assessed)(pt able to turn and look behine, but has to slow to look)                                           Pertinent Vitals/Pain Pain Assessment: No/denies pain    Home Living  Family/patient expects to be discharged to:: Private residence Living Arrangements: Spouse/significant other Available Help at Discharge: Family;Available PRN/intermittently Type of Home: House Home Access: Stairs to enter Entrance Stairs-Rails: None Entrance Stairs-Number of Steps: 4 Home Layout: One level Home Equipment: None      Prior Function Level of Independence: Independent         Comments: No AD. Able to ambulate in house and short community distances. No falls in past year     Hand Dominance        Extremity/Trunk Assessment   Upper Extremity Assessment Upper Extremity Assessment: Overall WFL for tasks assessed    Lower Extremity Assessment Lower Extremity Assessment: Overall WFL for tasks assessed       Communication   Communication: No difficulties  Cognition Arousal/Alertness: Awake/alert Behavior During Therapy: WFL for tasks assessed/performed Overall Cognitive Status: Within Functional Limits for tasks assessed                                        General Comments General comments (skin integrity, edema, etc.): VSS,   HR 90's, sats 95% on RA    Exercises     Assessment/Plan    PT Assessment All further  PT needs can be met in the next venue of care(pt looks to be falling through the cracks with mobility)  PT Problem List Decreased activity tolerance;Decreased mobility;Cardiopulmonary status limiting activity       PT Treatment Interventions      PT Goals (Current goals can be found in the Care Plan section)  Acute Rehab PT Goals Patient Stated Goal: To go home    Frequency     Barriers to discharge        Co-evaluation               AM-PAC PT "6 Clicks" Daily Activity  Outcome Measure Difficulty turning over in bed (including adjusting bedclothes, sheets and blankets)?: None Difficulty moving from lying on back to sitting on the side of the bed? : None Difficulty sitting down on and standing up from a  chair with arms (e.g., wheelchair, bedside commode, etc,.)?: None Help needed moving to and from a bed to chair (including a wheelchair)?: None Help needed walking in hospital room?: None Help needed climbing 3-5 steps with a railing? : A Little 6 Click Score: 23    End of Session   Activity Tolerance: Patient tolerated treatment well Patient left: in bed;with call bell/phone within reach;with family/visitor present Nurse Communication: Mobility status PT Visit Diagnosis: Other abnormalities of gait and mobility (R26.89)    Time: 7867-6720 PT Time Calculation (min) (ACUTE ONLY): 17 min   Charges:   PT Evaluation $PT Eval Moderate Complexity: 1 Mod     PT G Codes:        2017/07/17  Donnella Sham, PT [phone]  (pager)  Tessie Fass Shanasia Ibrahim 07-17-17, 3:50 PM

## 2017-07-12 NOTE — Care Management Note (Addendum)
Case Management Note  Patient Details  Name: Kristen Bradley MRN: [id number] Date of Birth: 09-Feb-1945  Subjective/Objective:    CHF, CAD, CKD, HTN, unstable angina                Action/Plan: NCM spoke to pt and lives with friend at home. States she is independent at home. No DME needed. Waiting PT evaluation.    Pt agreeable to [facility] for home. Offered choice for HH/list provided. Pt agreeable to [facility] for [facility] and PT. Contacted Alvis Lemmings with new referral.    Expected Discharge Date:  07/12/17               Expected Discharge Plan:  Home/Self Care  In-House Referral:  NA  Discharge planning Services  CM Consult  Post Acute Care Choice:  NA Choice offered to:  NA  DME Arranged:  N/A DME Agency:  NA  HH Arranged:  NA HH Agency:  NA  Status of Service:  Completed, signed off  If discussed at [place] of Stay Meetings, dates discussed:    Additional Comments:  Erenest Rasher, RN 07/12/2017, 2:36 PM

## 2017-07-12 NOTE — Progress Notes (Signed)
Pt's home medication picked up from pharmacy and returned to pt.

## 2017-07-12 NOTE — Telephone Encounter (Signed)
TOC Kristen Bradley- Please call Kristen Bradley- Kristen Bradley has an appointment on 07-20-17 with Vin.

## 2017-07-12 NOTE — Progress Notes (Signed)
Pt states that she is starting to feel distended and uncomfortable since she hasn't had a BM or a few days, got Myrilax ordered but was ineffective, will attempt to get something else ordered and continue to monitor.

## 2017-07-12 NOTE — Progress Notes (Signed)
Progress Note  Patient Name: Kristen Bradley Date of Encounter: 07/12/2017  Primary Cardiologist: [name], MD   Subjective   Denies any chest pain and SOB has improved.    Inpatient Medications    Scheduled Meds: . carvedilol  6.25 mg Oral BID WC  . clopidogrel  75 mg Oral Daily  . fenofibrate  54 mg Oral Daily  . furosemide  80 mg Oral BID  . Glecaprevir-Pibrentasvir  3 tablet Oral Q24H  . hydrALAZINE  25 mg Oral Q8H  . insulin aspart  0-9 Units Subcutaneous TID WC  . isosorbide mononitrate  60 mg Oral Daily  . rosuvastatin  10 mg Oral Daily   Continuous Infusions:  PRN Meds: acetaminophen **OR** acetaminophen, bisacodyl, nitroGLYCERIN, ondansetron **OR** ondansetron (ZOFRAN) IV, polyethylene glycol   Vital Signs    Vitals:   07/12/17 0003 07/12/17 0330 07/12/17 0621 07/12/17 0744  BP: (!) 123/55 132/75 132/64 121/71  Pulse: 74 78  75  Resp: 16 16    Temp: 98.7 F (37.1 C) 98.4 F (36.9 C)  98.5 F (36.9 C)  TempSrc: Oral Oral  Oral  SpO2: 97% 100%  100%  Weight:  149 lb 14.4 oz (68 kg)    Height:        Intake/Output Summary (Last 24 hours) at 07/12/2017 0816 Last data filed at 07/12/2017 0300 Gross per 24 hour  Intake 840 ml  Output 2050 ml  Net -1210 ml   Filed Weights   07/10/17 0439 07/11/17 0318 07/12/17 0330  Weight: 151 lb (68.5 kg) 152 lb 6.4 oz (69.1 kg) 149 lb 14.4 oz (68 kg)    Telemetry    NSR - Personally Reviewed  ECG    No new EKG to review - Personally Reviewed  Physical Exam   GEN: No acute distress.   Neck: No JVD Cardiac: RRR, no murmurs, rubs, or gallops.  Respiratory: Clear to auscultation bilaterally. GI: Soft, nontender, non-distended  MS: No edema; No deformity. Neuro:  Nonfocal  Psych: Normal affect   Labs    Chemistry Recent Labs  Lab 07/09/17 0324 07/10/17 0408 07/11/17 0629  NA 140 139 139  K 4.9 4.5 4.9  CL 114* 113* 114*  CO2 16* 17* 18*  GLUCOSE 116* 108* 114*  BUN 54* 51* 53*    CREATININE 3.77* 3.76* 3.74*  CALCIUM 10.1 10.2 10.4*  GFRNONAA 11* 11* 11*  GFRAA 13* 13* 13*  ANIONGAP 10 9 7      Hematology Recent Labs  Lab 07/09/17 0324 07/10/17 0408 07/11/17 0629  WBC 7.0 3.5* 4.3  RBC 3.16* 2.93* 3.09*  HGB 9.0* 8.3* 8.8*  HCT 30.2* 27.4* 28.7*  MCV 95.6 93.5 92.9  MCH 28.5 28.3 28.5  MCHC 29.8* 30.3 30.7  RDW 15.8* 15.8* 15.6*  PLT 119* 105* 111*    Cardiac Enzymes Recent Labs  Lab 07/09/17 0324 07/09/17 0806 07/09/17 1206 07/09/17 1901  TROPONINI 0.03* 0.04* 0.04* 0.04*   No results for input(s): TROPIPOC in the last 168 hours.   BNP Recent Labs  Lab 07/09/17 0324  BNP 1,552.5*     DDimer No results for input(s): DDIMER in the last 168 hours.   Radiology    No results found.  Cardiac Studies   Study Conclusions  - Left ventricle: The cavity size was mildly dilated. Wall thickness was normal. Systolic function was severely reduced. The estimated ejection fraction was in the range of 20% to 25%. Diffuse hypokinesis with disproportionately severe anteroapical and lateral hypokinesis.  Wall motion suggests CAD in the distribution of multiple vessels. Features are consistent with a pseudonormal left ventricular filling pattern, with concomitant abnormal relaxation and increased filling pressure (grade 2 diastolic dysfunction). - Mitral valve: There was mild to moderate regurgitation directed centrally. The acceleration rate of the regurgitant jet was reduced, consistent with a low dP/dt. - Left atrium: The atrium was mildly dilated. - Tricuspid valve: There was mild-moderate regurgitation directed centrally. - Pulmonary arteries: Systolic pressure was moderately increased. PA peak pressure: 55 mm Hg (S).  Impressions:  - Unusually brisk systolic and diastolic flow is seen in the ostium of the right coronary artery. Consider coronary AV fistula.   Patient Profile     72 y.o. female with  PMH of CAD, ICM, ICD, chronic combined CHF with recovery of LVEF after CRT, renal artery stenosis with stent, PAD, HTN, HLD, DM, and tobacco abuse who was readmitted 6/8 with sudden onset chest pain and SOB.   Assessment & Plan    1. Unstable angina in patient with CAD: known multivessel CAD on cath 02/2016 not felt to be a candidate for PCI or CABG. NSTEMI 06/2017 with trop peak 4.5. Medically managed on Bblocker, nitrate, CCB, statin, and ASA. She presented 6/7 with exertional CP and dyspnea. Trop 0.04, flat trend. Imdur was increased to 60mg  daily  - Echo  with EF 20-25%, G2DD, and diffuse hypokinesis with disproportionately severe anteroapicaland lateral hypokinesis concerning for progression of disease. - Continue plavix, bblocker, long-acting nitrate, and statin - She is not on ASA due to GI bleeding in the past and was switched to Plavix due to history of CVA -Cardizem was stopped due to LV dysfunction  2. Acute on chronic combined CHF: Echo this admission with EF 20-25% (previously 55-60% 03/2017), G2DD, diffuse hypokinesis with disproportionately severe anteroapicaland lateral hypokinesis. -Continue BBlocker, long-acting nitrate and low-dose hydralazine -Nephrology recommends Lasix 80 mg twice daily p.o. -She put out 2 L yesterday and is net -4 L -Her weight is down 4 pounds from admission -She appears euvolemic on exam  3. PAD s/p CEA: patent by carotid u/s in 2015. Prev followed by vasc surgery. Cont Lasix, statin.  4. CKD III/IV: stable since admission but worsened since 10/2016 when creat was 1.77 (was in 2.0 range prior to that).  -Appreciate nephrology input -Recommend Lasix 80 mg twice daily p.o. -Patient is leaning towards peritoneal dialysis  5. Essential HTN -BP is stable at 132/64 mmHg - Continue BBlocker, hydralazine and nitrate  6. HL: LDL 70.  - Continue statin.  7. DMII: A1c 6.0 06/2017. - Continue management per primary team  8. Normocytic  anemia: Stable dating back to May but progressive since Sept 2018. Likely contributing to angina in setting of severe 3VD. Was planned for outpt colonoscopy with Dr. Michail Sermon but cancelled 2/2 heart issues earlier this year.  - Continue management per primary team and outpatient follow-up with GI - Continue po iron.  Patient is stable from a cardiac standpoint for discharge today.  We will set up outpatient follow-up in our office.  Discharge cardiac meds include: Carvedilol 6.25 mg twice daily, Plavix 75 mg daily, Lasix 80 mg p.o. twice daily, hydralazine 25 mg 3 times daily, Imdur 60 mg daily and Crestor 10 mg daily.  Will sign off.  Please call with any questions.  We will set up follow-up in our office.  For questions or updates, please contact [place] Please consult [email or web address] for contact info under Cardiology/STEMI.      Signed,  Fransico Him, MD  07/12/2017, 8:16 AM

## 2017-07-12 NOTE — Progress Notes (Signed)
Admit: 07/09/2017 LOS: 3  32F with progressive CKD5 likley cardiorenal in etiology; CAD / chronic angina, and chronic systolic CHF from ICM  Subjective:  AM labs pending > 2L UOP yesterday, 4L neg from admission; 149lb down 4lb from admission Still with exertional dyspnea More confident today that she wishes to do PD D/w Dr. Justin Mend who supports a trial of PD  06/10 0701 - 06/11 0700 In: 840 [P.O.:840] Out: 2050 [Urine:2050]  Filed Weights   07/10/17 0439 07/11/17 0318 07/12/17 0330  Weight: 68.5 kg (151 lb) 69.1 kg (152 lb 6.4 oz) 68 kg (149 lb 14.4 oz)    Scheduled Meds: . carvedilol  6.25 mg Oral BID WC  . clopidogrel  75 mg Oral Daily  . fenofibrate  54 mg Oral Daily  . furosemide  80 mg Oral BID  . Glecaprevir-Pibrentasvir  3 tablet Oral Q24H  . hydrALAZINE  25 mg Oral Q8H  . insulin aspart  0-9 Units Subcutaneous TID WC  . isosorbide mononitrate  60 mg Oral Daily  . rosuvastatin  10 mg Oral Daily   Continuous Infusions: PRN Meds:.acetaminophen **OR** acetaminophen, bisacodyl, nitroGLYCERIN, ondansetron **OR** ondansetron (ZOFRAN) IV, polyethylene glycol  Current Labs: reviewed    Physical Exam:  Blood pressure 121/71, pulse 75, temperature 98.5 F (36.9 C), temperature source Oral, resp. rate 16, height 5\' 6"  (1.676 m), weight 68 kg (149 lb 14.4 oz), SpO2 100 %. GEN: NAD lying in bed at 60eg ENT: NCAT  EYES: EOMI CV: RRR, normal S1 and S2 PULM: Bibasilar crackles present, normal work of breathing ABD: Soft, nontender SKIN: No rashes or lesions EXT: No lower extremity edema  A 1. Progressive CKD5, preseumed cardiorenal 2. ICM, AoC sCHF exacerbation; hx/o ICD/CRT 3. CAD, severe and diffuse; not CABG or PCI candidate, med mgmt 4. HTN 5. PAD, hx/o RAS ans stent, hx/o CEA 6. HCV on therpay 7. Chronic mild TCP 8. Mild NAG metabolic acidosis   P 1. Will try to return and speak with son today too 2. If to do PD I think now is window to prepare, will coordinate  with Home Therapies and arrange next steps 3. Cont lasix  4. Daily weights, Daily Renal Panel, Strict I/Os, Avoid nephrotoxins (NSAIDs, judicious IV Contrast)    Pearson Grippe MD 07/12/2017, 8:20 AM  Recent Labs  Lab 07/09/17 0324 07/10/17 0408 07/11/17 0629  NA 140 139 139  K 4.9 4.5 4.9  CL 114* 113* 114*  CO2 16* 17* 18*  GLUCOSE 116* 108* 114*  BUN 54* 51* 53*  CREATININE 3.77* 3.76* 3.74*  CALCIUM 10.1 10.2 10.4*   Recent Labs  Lab 07/09/17 0324 07/10/17 0408 07/11/17 0629  WBC 7.0 3.5* 4.3  NEUTROABS 5.7  --   --   HGB 9.0* 8.3* 8.8*  HCT 30.2* 27.4* 28.7*  MCV 95.6 93.5 92.9  PLT 119* 105* 111*

## 2017-07-12 NOTE — Care Management Important Message (Signed)
Important Message  Patient Details  Name: Kristen Bradley MRN: [id number] Date of Birth: 1945-04-16   Medicare Important Message Given:  Yes    Erenest Rasher, RN 07/12/2017, 11:46 AM

## 2017-07-12 NOTE — Progress Notes (Signed)
Discharge instructions reviewed with pt. Pt has no questions at this time. Pt denies any pain and she states she is ready for d/c. IV removed. Will cont to monitor pt. Prescriptions given to pt.

## 2017-07-12 NOTE — Telephone Encounter (Signed)
**Note De-Identified Kristen Bradley Obfuscation** The pt is being discharged from the hospital sometime today. Will call tomorrow.

## 2017-07-12 NOTE — Progress Notes (Signed)
Pt ambulated in the hall ways and pt's 02 stayed in the mid to high 90's. Pt denied any SOB. Pt dose not qualify for 02.

## 2017-07-12 NOTE — Discharge Summary (Signed)
Physician Discharge Summary   Patient ID: Kristen Bradley MRN: [id number] DOB/AGE: 07/28/1945 72 y.o.  Admit date: 07/09/2017 Discharge date: 07/12/2017  Primary Care Physician:  [name], MD   Recommendations for Outpatient Follow-up:  1. Follow up with PCP in 1-2 weeks 2. Outpatient follow-up with nephrology, Dr. Justin Mend to coordinate peritoneal dialysis [place]: PT evaluation prior to discharge. Equipment/Devices:   Discharge Condition: stable CODE STATUS: Partial Diet recommendation: Heart healthy diet   Discharge Diagnoses:    . Unstable angina ([place]) . Peripheral vascular disease ([place]) . Ischemic cardiomyopathy with CAD, medically managed . CKD (chronic kidney disease) stage 4, GFR 15-29 ml/min (HCC) Acute on chronic systolic CHF Normocytic anemia Essential hypertension  Consults: Cardiology Nephrology    Allergies:   Allergies  Allergen Reactions  . Chlorhexidine Gluconate Itching  . Ace Inhibitors Cough  . Dilaudid [Hydromorphone Hcl] Itching     DISCHARGE MEDICATIONS: Allergies as of 07/12/2017      Reactions   Chlorhexidine Gluconate Itching   Ace Inhibitors Cough   Dilaudid [hydromorphone Hcl] Itching      Medication List    STOP taking these medications   amLODipine 10 MG tablet Commonly known as:  NORVASC   aspirin EC 81 MG tablet   cloNIDine 0.1 MG tablet Commonly known as:  CATAPRES   metoprolol succinate 25 MG 24 hr tablet Commonly known as:  TOPROL-XL     TAKE these medications   acetaminophen 500 MG tablet Commonly known as:  TYLENOL Take 1,000 mg by mouth every 6 (six) hours as needed for mild pain or headache.   carvedilol 6.25 MG tablet Commonly known as:  COREG Take 1 tablet (6.25 mg total) by mouth 2 (two) times daily with a meal.   clopidogrel 75 MG tablet Commonly known as:  PLAVIX Take 1 tablet (75 mg total) by mouth daily.   fenofibrate 48 MG tablet Commonly known as:  TRICOR Take 1  tablet (48 mg total) by mouth daily.   furosemide 80 MG tablet Commonly known as:  LASIX Take 1 tablet (80 mg total) by mouth 2 (two) times daily.   glipiZIDE 5 MG tablet Commonly known as:  GLUCOTROL Take 2.5 mg by mouth daily before breakfast.   hydrALAZINE 25 MG tablet Commonly known as:  APRESOLINE Take 1 tablet (25 mg total) by mouth 3 (three) times daily.   IRON PO Take 1 tablet by mouth daily.   isosorbide mononitrate 60 MG 24 hr tablet Commonly known as:  IMDUR Take 1 tablet (60 mg total) by mouth daily. What changed:    medication strength  how much to take   MAVYRET 100-40 MG Tabs Generic drug:  Glecaprevir-Pibrentasvir Take 3 tablets by mouth daily.   nitroGLYCERIN 0.4 MG SL tablet Commonly known as:  NITROSTAT Place 1 tablet (0.4 mg total) under the tongue every 5 (five) minutes as needed for chest pain.   polyethylene glycol packet Commonly known as:  MIRALAX / GLYCOLAX Take 17 g by mouth daily as needed for mild constipation or moderate constipation.   rosuvastatin 10 MG tablet Commonly known as:  CRESTOR Take 1 tablet (10 mg total) by mouth daily.        Brief H and P: For complete details please refer to admission H and P, but in brief Patient is a 72 year old female with history of CAD, cardiac cath 01/20/2017, felt not to be candidate for any intervention, recently discharged from cardiology service on 5/29, admitted with CHF and  NSTEMI, presented to ED with sudden onset of chest pain and shortness of breath.  Patient reported walking to the bathroom and on the way back having chest pressure radiating to neck with shortness of breath.  Chest x-ray showed pulmonary congestion and pleural effusion.  Cardiology was consulted and patient was admitted for unstable angina and acute on chronic CHF.   Hospital Course:  Unstable angina (HCC)/chest pain in the setting of known obstructive CAD, ischemic cardiomyopathy, medically managed -2D echo 6/9 showed  EF of 20 to 25% with diffuse hypokinesis with disproportionately severe anteroapical and lateral hypokinesis, grade 2 diastolic dysfunction (prior 2D echo 2/19 had shown normal EF 50 to 55%)  -Continue Plavix, beta-blocker, nitrate and statin.  Imdur increased to 60 mg daily, started on hydralazine. Not a candidate for aspirin due to GI bleeding in the past and hence was switched to Plavix, history of CVA Cardizem discontinued due to LV dysfunction.    Acute on chronic systolic CHF (congestive heart failure) (HCC) -BNP 1552 at the time of admission -Patient received 40 mg IV Lasix x1 in ED, states significant improvement in her respiratory status since then.   -Per cardiology, not a candidate for PCI/CABG, continue medical therapy. -CCB discontinued, started hydralazine, continue nitrates.  Change to Coreg 6.25 mg twice a day. -Continue Lasix 80 mg twice a day oral, negative balance of 4.1 L -Discussed with nephrology, patient will have outpatient follow-up with Dr. Justin Mend, for peritoneal dialysis education, access if patient is ready     Peripheral vascular disease ([place]) status post CEA Continue plavix, statin    CKD (chronic kidney disease) stage 4, GFR 15-29 ml/min (HCC) -Not on ARB due to renal insufficiency, baseline creatinine 3-3.3 -  Creatinine remained stable at 3.7. -Nephrology was consulted, patient will have outpatient follow-up with Dr. Justin Mend, for peritoneal dialysis education, access if patient is ready.  Recommended to continue Lasix 80 mg twice a day.    Type 2 diabetes mellitus with vascular disease ([place]) -CBGs remained stable, continue low-dose glipizide  Hypertension -Currently stable, continue hydralazine, nitrate, Coreg, Lasix Amlodipine discontinued  Anemia, normocytic Hemoglobin 8.8, was planned for outpatient colonoscopy however canceled due to cardiac issues, GI Dr. Michail Sermon Continue oral iron, ferritin 188 Transfuse for Hb < less than 8    Day of  Discharge S: Still has shortness of breath on exertion, no chest pain.  No acute issues overnight  BP 138/60   Pulse 75   Temp 98.5 F (36.9 C) (Oral)   Resp 16   Ht 5\' 6"  (1.676 m)   Wt 68 kg (149 lb 14.4 oz)   SpO2 100%   BMI 24.19 kg/m   Physical Exam: General: Alert and awake oriented x3 not in any acute distress. HEENT: anicteric sclera, pupils reactive to light and accommodation CVS: S1-S2 clear no murmur rubs or gallops Chest: clear to auscultation bilaterally, no wheezing rales or rhonchi Abdomen: soft nontender, nondistended, normal bowel sounds Extremities: no cyanosis, clubbing or edema noted bilaterally Neuro: Cranial nerves II-XII intact, no focal neurological deficits   The results of significant diagnostics from this hospitalization (including imaging, microbiology, ancillary and laboratory) are listed below for reference.      Procedures/Studies:  Dg Chest 2 View  Result Date: 07/09/2017 CLINICAL DATA:  Acute onset of shortness of breath with exertion and orthopnea. Decreased O2 saturation. EXAM: CHEST - 2 VIEW COMPARISON:  Chest radiograph performed May 29, 202019 FINDINGS: The lungs are well-aerated. Vascular congestion is noted. Increased interstitial markings raise concern  for pulmonary edema. A small left pleural effusion is noted. There is no evidence of pneumothorax. The heart is mildly enlarged. A pacemaker/AICD is noted at the left chest wall, with leads ending at the right atrium and right ventricle. No acute osseous abnormalities are seen. Clips are noted within the right upper quadrant, reflecting prior cholecystectomy. IMPRESSION: Vascular congestion and mild cardiomegaly. Increased interstitial markings raise concern for pulmonary edema. Small left pleural effusion noted. Electronically Signed   By: Garald Balding M.D.   On: 07/09/2017 03:34   Dg Chest 2 View  Result Date: 2020/12/2617 CLINICAL DATA:  Chest tightness and short of breath EXAM: CHEST - 2 VIEW  COMPARISON:  11/16/2016 FINDINGS: The heart is moderately enlarged. Left subclavian AICD device is stable. Vascular congestion. Right basilar airspace disease. No pneumothorax. No pleural effusion. IMPRESSION: Right basilar airspace disease. Followup PA and lateral chest X-ray is recommended in 3-4 weeks following trial of antibiotic therapy to ensure resolution and exclude underlying malignancy. Electronically Signed   By: Marybelle Killings M.D.   On: 02020/12/2617 12:31   Nm Pet Image Initial (pi) Skull Base To Thigh  Result Date: 06/17/2017 CLINICAL DATA:  Initial treatment strategy for lung nodule. EXAM: NUCLEAR MEDICINE PET SKULL BASE TO THIGH TECHNIQUE: 8.0 mCi F-18 FDG was injected intravenously. Full-ring PET imaging was performed from the skull base to thigh after the radiotracer. CT data was obtained and used for attenuation correction and anatomic localization. Fasting blood glucose: 121 mg/dl COMPARISON:  Multiple exams, including CT scan 05/18/2017 and 12/31/2010 FINDINGS: Mediastinal blood pool activity: SUV max 2.6 Note: Misregistered CT and PET data in the head/neck region. I carefully reviewed the non attenuation corrected images. NECK: No significant abnormal hypermetabolic activity in this region. Incidental CT findings: Carotid atherosclerotic calcifications. CHEST: The right lower lobe nodule has indistinct margins, probably because it is blurred by motion artifact. This nodule measures approximately 1.0 by 0.6 cm on today's exam, with maximum standard uptake value of this indistinct density 1.2. Incidental CT findings: Right anterior subpleural reticulation along the right middle lobe without hypermetabolic activity, compatible with prior radiation therapy port. Coronary, aortic arch, and branch vessel atherosclerotic vascular disease. Chronically stable saccular outpouching of the aortic arch measuring approximately 1.7 by 1.1 cm on image 54/4, similar to that shown on prior chest CT.  Mild-to-moderate cardiomegaly. Pacer/AICD device noted. 0.7 cm right paratracheal lymph node on image 54/4. No hypermetabolic nodal activity in the chest. ABDOMEN/PELVIS: No significant abnormal hypermetabolic activity in this region. Incidental CT findings: Postoperative findings along the pancreatic tail and adjacent to the spleen. Aortoiliac atherosclerotic vascular disease. Bilateral renal lesions of varying complexity and density; no obvious lesions hypermetabolic to the renal parenchyma but this does not necessarily exclude small renal neoplasm. Infrarenal abdominal aortic aneurysm measuring up to 3.8 cm obliquely, slight saccular component, not changed from previous. No pathologic adenopathy. Descending and sigmoid colon diverticulosis. There continues to be focal accentuated density adjacent to the sigmoid colon measuring 2.6 by 1.6 cm on image 152/4. This has low-grade accentuated metabolic activity with maximum SUV 3.6. This has a slightly and slightly tethering appearance on multiplanar imaging. SKELETON: No significant abnormal hypermetabolic activity in this region. Incidental CT findings: Degenerative glenohumeral arthropathy on the left. Lumbar spondylosis and degenerative disc disease at L3-4 and L4-5. IMPRESSION: 1. The right lower lobe nodule is blurred by motion artifact and has slightly indistinct nodules but measures about 1.0 by 0.6. Maximum standard uptake value is 1.2. Although the maximum SUV is  somewhat reassuring, low-grade adenocarcinoma is not excluded, and the probable increase in size compared to 2015 is still concerning. At a minimum, surveillance by chest CT is likely warranted. 2. Again observed is a bandlike density adjacent to the sigmoid colon in the upper pelvis with low-grade metabolic activity, maximum SUV 3.6. This has an appearance suggesting postinflammatory findings related to prior diverticulitis. Strictly speaking, a carcinoid tumor or local colon cancer could have a  similar appearance, and I would recommend correlation with colon cancer screening and continued surveillance. 3. Other imaging findings of potential clinical significance: Aortic Atherosclerosis (ICD10-I70.0). Coronary atherosclerosis. Chronic saccular outpouching of the aortic arch. Aortic aneurysm NOS (ICD10-I71.9). Abdominal aortic aneurysm 3.6 cm at its widest. This is stable. Postoperative findings along the pancreatic tail and spleen. Mild-to-moderate cardiomegaly. Bilateral renal lesions of varying complexity, most likely to be cysts although not technically specific in some cases. Descending and sigmoid colon diverticulosis. Lumbar spondylosis and degenerative disc disease. Electronically Signed   By: Van Clines M.D.   On: 06/17/2017 08:58       LAB RESULTS: Basic Metabolic Panel: Recent Labs  Lab 07/09/17 0806  07/11/17 0629 07/12/17 0754  NA  --    < > 139 139  K  --    < > 4.9 4.5  CL  --    < > 114* 113*  CO2  --    < > 18* 17*  GLUCOSE  --    < > 114* 106*  BUN  --    < > 53* 53*  CREATININE  --    < > 3.74* 3.76*  CALCIUM  --    < > 10.4* 10.6*  MG 2.4  --   --   --    < > = values in this interval not displayed.   Liver Function Tests: No results for input(s): AST, ALT, ALKPHOS, BILITOT, PROT, ALBUMIN in the last 168 hours. No results for input(s): LIPASE, AMYLASE in the last 168 hours. No results for input(s): AMMONIA in the last 168 hours. CBC: Recent Labs  Lab 07/09/17 0324  07/11/17 0629 07/12/17 0754  WBC 7.0   < > 4.3 4.0  NEUTROABS 5.7  --   --   --   HGB 9.0*   < > 8.8* 8.7*  HCT 30.2*   < > 28.7* 28.0*  MCV 95.6   < > 92.9 93.3  PLT 119*   < > 111* 103*   < > = values in this interval not displayed.   Cardiac Enzymes: Recent Labs  Lab 07/09/17 1206 07/09/17 1901  TROPONINI 0.04* 0.04*   BNP: Invalid input(s): POCBNP CBG: Recent Labs  Lab 07/11/17 2045 07/12/17 0742  GLUCAP 105* 104*      Disposition and Follow-up: Discharge  Instructions    (HEART FAILURE PATIENTS) Call MD:  Anytime you have any of the following symptoms: 1) 3 pound weight gain in 24 hours or 5 pounds in 1 week 2) shortness of breath, with or without a dry hacking cough 3) swelling in the hands, feet or stomach 4) if you have to sleep on extra pillows at night in order to breathe.   Complete by:  As directed    Diet Carb Modified   Complete by:  As directed    Increase activity slowly   Complete by:  As directed        DISPOSITION: Home   DISCHARGE FOLLOW-UP Follow-up Information    [name], MD. Schedule an appointment  as soon as possible for a visit in 2 week(s).   Specialty:  Family Medicine Contact information: [address] [place] [address] [phone]        Leanor Kail, PA Follow up on 07/20/2017.   Specialty:  Cardiology Why:  Please arrive 15 minutes early for your 2:00pm appointment Contact information: [address] [place] [address] [phone]        Edrick Oh, MD. Schedule an appointment as soon as possible for a visit in 2 week(s).   Specialty:  Nephrology Contact information: [place] [address] [phone]            Time coordinating discharge:  40 minutes  Signed:   Estill Cotta M.D. [facility] 07/12/2017, 11:12 AM Pager: [phone]

## 2017-07-13 ENCOUNTER — Other Ambulatory Visit: Payer: Self-pay

## 2017-07-13 DIAGNOSIS — I214 Non-ST elevation (NSTEMI) myocardial infarction: Secondary | ICD-10-CM | POA: Diagnosis not present

## 2017-07-13 DIAGNOSIS — I25110 Atherosclerotic heart disease of native coronary artery with unstable angina pectoris: Secondary | ICD-10-CM | POA: Diagnosis not present

## 2017-07-13 DIAGNOSIS — I5023 Acute on chronic systolic (congestive) heart failure: Secondary | ICD-10-CM | POA: Diagnosis not present

## 2017-07-13 DIAGNOSIS — N184 Chronic kidney disease, stage 4 (severe): Secondary | ICD-10-CM | POA: Diagnosis not present

## 2017-07-13 DIAGNOSIS — E1151 Type 2 diabetes mellitus with diabetic peripheral angiopathy without gangrene: Secondary | ICD-10-CM | POA: Diagnosis not present

## 2017-07-13 DIAGNOSIS — D631 Anemia in chronic kidney disease: Secondary | ICD-10-CM | POA: Diagnosis not present

## 2017-07-13 DIAGNOSIS — Z7902 Long term (current) use of antithrombotics/antiplatelets: Secondary | ICD-10-CM | POA: Diagnosis not present

## 2017-07-13 DIAGNOSIS — E1122 Type 2 diabetes mellitus with diabetic chronic kidney disease: Secondary | ICD-10-CM | POA: Diagnosis not present

## 2017-07-13 DIAGNOSIS — I130 Hypertensive heart and chronic kidney disease with heart failure and stage 1 through stage 4 chronic kidney disease, or unspecified chronic kidney disease: Secondary | ICD-10-CM | POA: Diagnosis not present

## 2017-07-13 DIAGNOSIS — I252 Old myocardial infarction: Secondary | ICD-10-CM | POA: Diagnosis not present

## 2017-07-13 NOTE — Telephone Encounter (Signed)
**Note De-Identified [place] Obfuscation** 1st attempted TCM Call: No answer and no way to leave a message on the pts home phone. I then called her cell # and got no answer but was able to leave a message asking her to call us back.

## 2017-07-13 NOTE — Consult Note (Signed)
Received referral today from in basket and request from inpatient RNCM, Alesia.   Call placed to the patient and 2 HIPAA appropriate identifiers used to speak with the patient regarding [facility] services. Patient is in the [place] with [facility].    Patient states she is doing, "alright today."  Patient states she was admitted with chest pain and had a heart attack.  She was consulted for HF follow up with [facility].  She also states she will likely have to start hemodialysis as her kidneys are now stage IV.  She confirms she has an appointment with Dr. Justin Mend on 07/19/17 at 3:15 to discuss her situation. She also states she has an appointment with Dr. [name] her primary care provider [facility] [this provider is listed to provide the transition of care calls and follow up] on 07/15/17 at 3:45 pm.  [facility].  She states her son helped helped her.  She also uses mail delivery for her medications through [facility].  Patient verbally agrees to [facility].  Will assign patient to Health Coach for follow up and needs.   For questions, please contact:  Natividad Brood, RN BSN [facility] Liaison  [phone] business mobile phone Toll free office [phone]

## 2017-07-14 ENCOUNTER — Encounter: Payer: Self-pay | Admitting: *Deleted

## 2017-07-14 DIAGNOSIS — I130 Hypertensive heart and chronic kidney disease with heart failure and stage 1 through stage 4 chronic kidney disease, or unspecified chronic kidney disease: Secondary | ICD-10-CM | POA: Diagnosis not present

## 2017-07-14 DIAGNOSIS — I214 Non-ST elevation (NSTEMI) myocardial infarction: Secondary | ICD-10-CM | POA: Diagnosis not present

## 2017-07-14 DIAGNOSIS — N184 Chronic kidney disease, stage 4 (severe): Secondary | ICD-10-CM | POA: Diagnosis not present

## 2017-07-14 DIAGNOSIS — D631 Anemia in chronic kidney disease: Secondary | ICD-10-CM | POA: Diagnosis not present

## 2017-07-14 DIAGNOSIS — I5023 Acute on chronic systolic (congestive) heart failure: Secondary | ICD-10-CM | POA: Diagnosis not present

## 2017-07-14 DIAGNOSIS — E1122 Type 2 diabetes mellitus with diabetic chronic kidney disease: Secondary | ICD-10-CM | POA: Diagnosis not present

## 2017-07-14 NOTE — Telephone Encounter (Signed)
Patient contacted regarding discharge from [facility] on July 12, 2017.  Patient understands to follow up with provider Robbie Lis, PA-C on July 20, 2017 at 9PM at [facility]. Patient understands discharge instructions? yes Patient understands medications and regiment? yes Patient understands to bring all medications to this visit? yes  Pt asked me to speak with son and give instructions.  Pt verbalized understanding and was appreciative for call.

## 2017-07-15 DIAGNOSIS — I5022 Chronic systolic (congestive) heart failure: Secondary | ICD-10-CM | POA: Diagnosis not present

## 2017-07-15 DIAGNOSIS — I6523 Occlusion and stenosis of bilateral carotid arteries: Secondary | ICD-10-CM | POA: Diagnosis not present

## 2017-07-15 DIAGNOSIS — I2584 Coronary atherosclerosis due to calcified coronary lesion: Secondary | ICD-10-CM | POA: Diagnosis not present

## 2017-07-15 DIAGNOSIS — I739 Peripheral vascular disease, unspecified: Secondary | ICD-10-CM | POA: Diagnosis not present

## 2017-07-15 DIAGNOSIS — B182 Chronic viral hepatitis C: Secondary | ICD-10-CM | POA: Diagnosis not present

## 2017-07-15 DIAGNOSIS — E1121 Type 2 diabetes mellitus with diabetic nephropathy: Secondary | ICD-10-CM | POA: Diagnosis not present

## 2017-07-15 DIAGNOSIS — Q254 Congenital malformation of aorta unspecified: Secondary | ICD-10-CM | POA: Diagnosis not present

## 2017-07-15 DIAGNOSIS — E119 Type 2 diabetes mellitus without complications: Secondary | ICD-10-CM | POA: Diagnosis not present

## 2017-07-15 DIAGNOSIS — I714 Abdominal aortic aneurysm, without rupture: Secondary | ICD-10-CM | POA: Diagnosis not present

## 2017-07-15 DIAGNOSIS — D649 Anemia, unspecified: Secondary | ICD-10-CM | POA: Diagnosis not present

## 2017-07-15 DIAGNOSIS — N186 End stage renal disease: Secondary | ICD-10-CM | POA: Diagnosis not present

## 2017-07-15 DIAGNOSIS — I255 Ischemic cardiomyopathy: Secondary | ICD-10-CM | POA: Diagnosis not present

## 2017-07-15 DIAGNOSIS — I2510 Atherosclerotic heart disease of native coronary artery without angina pectoris: Secondary | ICD-10-CM | POA: Diagnosis not present

## 2017-07-18 DIAGNOSIS — I130 Hypertensive heart and chronic kidney disease with heart failure and stage 1 through stage 4 chronic kidney disease, or unspecified chronic kidney disease: Secondary | ICD-10-CM | POA: Diagnosis not present

## 2017-07-18 DIAGNOSIS — N184 Chronic kidney disease, stage 4 (severe): Secondary | ICD-10-CM | POA: Diagnosis not present

## 2017-07-18 DIAGNOSIS — E1122 Type 2 diabetes mellitus with diabetic chronic kidney disease: Secondary | ICD-10-CM | POA: Diagnosis not present

## 2017-07-18 DIAGNOSIS — I214 Non-ST elevation (NSTEMI) myocardial infarction: Secondary | ICD-10-CM | POA: Diagnosis not present

## 2017-07-18 DIAGNOSIS — I5023 Acute on chronic systolic (congestive) heart failure: Secondary | ICD-10-CM | POA: Diagnosis not present

## 2017-07-18 DIAGNOSIS — D631 Anemia in chronic kidney disease: Secondary | ICD-10-CM | POA: Diagnosis not present

## 2017-07-19 DIAGNOSIS — N185 Chronic kidney disease, stage 5: Secondary | ICD-10-CM | POA: Diagnosis not present

## 2017-07-19 DIAGNOSIS — Z8619 Personal history of other infectious and parasitic diseases: Secondary | ICD-10-CM | POA: Diagnosis not present

## 2017-07-19 DIAGNOSIS — D631 Anemia in chronic kidney disease: Secondary | ICD-10-CM | POA: Diagnosis not present

## 2017-07-19 DIAGNOSIS — E1121 Type 2 diabetes mellitus with diabetic nephropathy: Secondary | ICD-10-CM | POA: Diagnosis not present

## 2017-07-19 DIAGNOSIS — I5022 Chronic systolic (congestive) heart failure: Secondary | ICD-10-CM | POA: Diagnosis not present

## 2017-07-19 DIAGNOSIS — N2581 Secondary hyperparathyroidism of renal origin: Secondary | ICD-10-CM | POA: Diagnosis not present

## 2017-07-19 DIAGNOSIS — Z95810 Presence of automatic (implantable) cardiac defibrillator: Secondary | ICD-10-CM | POA: Diagnosis not present

## 2017-07-19 DIAGNOSIS — N189 Chronic kidney disease, unspecified: Secondary | ICD-10-CM | POA: Diagnosis not present

## 2017-07-19 DIAGNOSIS — I120 Hypertensive chronic kidney disease with stage 5 chronic kidney disease or end stage renal disease: Secondary | ICD-10-CM | POA: Diagnosis not present

## 2017-07-20 ENCOUNTER — Ambulatory Visit: Payer: Medicare HMO | Admitting: Physician Assistant

## 2017-07-22 DIAGNOSIS — D631 Anemia in chronic kidney disease: Secondary | ICD-10-CM | POA: Diagnosis not present

## 2017-07-22 DIAGNOSIS — E1122 Type 2 diabetes mellitus with diabetic chronic kidney disease: Secondary | ICD-10-CM | POA: Diagnosis not present

## 2017-07-22 DIAGNOSIS — I130 Hypertensive heart and chronic kidney disease with heart failure and stage 1 through stage 4 chronic kidney disease, or unspecified chronic kidney disease: Secondary | ICD-10-CM | POA: Diagnosis not present

## 2017-07-22 DIAGNOSIS — N184 Chronic kidney disease, stage 4 (severe): Secondary | ICD-10-CM | POA: Diagnosis not present

## 2017-07-22 DIAGNOSIS — I5023 Acute on chronic systolic (congestive) heart failure: Secondary | ICD-10-CM | POA: Diagnosis not present

## 2017-07-22 DIAGNOSIS — I214 Non-ST elevation (NSTEMI) myocardial infarction: Secondary | ICD-10-CM | POA: Diagnosis not present

## 2017-07-25 ENCOUNTER — Other Ambulatory Visit: Payer: Self-pay | Admitting: *Deleted

## 2017-07-25 DIAGNOSIS — B182 Chronic viral hepatitis C: Secondary | ICD-10-CM | POA: Diagnosis not present

## 2017-07-25 NOTE — Patient Outreach (Signed)
[facility]) Care Management  07/25/2017  Kristen Bradley [birth year] [id number]   RN Health Coach attempted #[phone] up outreach call to patient.  Patient was unavailable. No voicemail message left. No voicemail pickup.  Plan: RN will call patient again within 3-5 days.  [place] [phone]

## 2017-07-26 DIAGNOSIS — I5023 Acute on chronic systolic (congestive) heart failure: Secondary | ICD-10-CM | POA: Diagnosis not present

## 2017-07-26 DIAGNOSIS — I130 Hypertensive heart and chronic kidney disease with heart failure and stage 1 through stage 4 chronic kidney disease, or unspecified chronic kidney disease: Secondary | ICD-10-CM | POA: Diagnosis not present

## 2017-07-26 DIAGNOSIS — D631 Anemia in chronic kidney disease: Secondary | ICD-10-CM | POA: Diagnosis not present

## 2017-07-26 DIAGNOSIS — N184 Chronic kidney disease, stage 4 (severe): Secondary | ICD-10-CM | POA: Diagnosis not present

## 2017-07-26 DIAGNOSIS — E1122 Type 2 diabetes mellitus with diabetic chronic kidney disease: Secondary | ICD-10-CM | POA: Diagnosis not present

## 2017-07-26 DIAGNOSIS — I214 Non-ST elevation (NSTEMI) myocardial infarction: Secondary | ICD-10-CM | POA: Diagnosis not present

## 2017-07-28 ENCOUNTER — Encounter: Payer: Self-pay | Admitting: *Deleted

## 2017-07-28 ENCOUNTER — Other Ambulatory Visit: Payer: Self-pay | Admitting: *Deleted

## 2017-07-28 NOTE — Patient Outreach (Signed)
[facility]) Care Management  07/28/2017   Kristen Bradley 05-19-1945 [id number]  RN Health Coach telephone call to patient.  Hipaa compliance verified. Per patient she is not weighing herself because she doesn't have a working scale. Patient did not remember the zones and action plan of CHF. Per patient she is a diabetic and is monitoring her blood sugars. Per patient they usually run around 110. Patient stated she tries to eat heart healthy diet with low sodium. Per patient she mostly eats boiled and baked chicken. Patient stated she could use some help with her diet. Patient stated she will probably have to go on dialysis but she is not currently on it. Shet has not had a hearing test in a very long time. Patient has not been to a dentist lately and stated that she needs to go but can't afford it at this time. Patient lives with a friend but the friend does travel a lot. Patient has a son that checks on her. Patient is still able to drive so transportation is not a problem. RN reinforced that transportation is available if need if she calls at least 3 days in advance to arrange. Patient does not go to a podiatrist. Patient has accepted further outreach calls.   Current Medications:  Current Outpatient Medications  Medication Sig Dispense Refill  . acetaminophen (TYLENOL) 500 MG tablet Take 1,000 mg by mouth every 6 (six) hours as needed for mild pain or headache.     . carvedilol (COREG) 6.25 MG tablet Take 1 tablet (6.25 mg total) by mouth 2 (two) times daily with a meal. 60 tablet 3  . clopidogrel (PLAVIX) 75 MG tablet Take 1 tablet (75 mg total) by mouth daily. 30 tablet 3  . fenofibrate (TRICOR) 48 MG tablet Take 1 tablet (48 mg total) by mouth daily. 90 tablet 3  . furosemide (LASIX) 80 MG tablet Take 1 tablet (80 mg total) by mouth 2 (two) times daily. 60 tablet 3  . glipiZIDE (GLUCOTROL) 5 MG tablet Take 2.5 mg by mouth daily before breakfast.     . hydrALAZINE  (APRESOLINE) 25 MG tablet Take 1 tablet (25 mg total) by mouth 3 (three) times daily. 90 tablet 3  . IRON PO Take 1 tablet by mouth daily.    . isosorbide mononitrate (IMDUR) 60 MG 24 hr tablet Take 1 tablet (60 mg total) by mouth daily. 30 tablet 3  . MAVYRET 100-40 MG TABS Take 3 tablets by mouth daily.  1  . nitroGLYCERIN (NITROSTAT) 0.4 MG SL tablet Place 1 tablet (0.4 mg total) under the tongue every 5 (five) minutes as needed for chest pain. 25 tablet 3  . polyethylene glycol (MIRALAX / GLYCOLAX) packet Take 17 g by mouth daily as needed for mild constipation or moderate constipation. 30 each 3  . rosuvastatin (CRESTOR) 10 MG tablet Take 1 tablet (10 mg total) by mouth daily. 60 tablet 3   No current facility-administered medications for this visit.     Functional Status:  In your present state of health, do you have any difficulty performing the following activities: 07/28/2017 07/09/2017  Hearing? N N  Vision? N N  Difficulty concentrating or making decisions? N N  Walking or climbing stairs? Y Y  Dressing or bathing? N N  Doing errands, shopping? N N  Preparing Food and eating ? N -  Using the Toilet? N -  In the past six months, have you accidently leaked urine? N -  Do  you have problems with loss of bowel control? N -  Managing your Medications? N -  Managing your Finances? N -  Housekeeping or managing your Housekeeping? N -  Some recent data might be hidden    Fall/Depression Screening: Fall Risk  07/28/2017 04/23/2016 04/08/2016  Falls in the past year? No No No   PHQ 2/9 Scores 07/28/2017 04/23/2016 04/08/2016  PHQ - 2 Score 0 0 0    Assessment:  Patient does not weigh self Patient does not have a scale Takes medications as per ordered Per patient will possibly need dialysis Patient needs additional information on low sodium diet.  Plan:  RN sent patient a scale RN sent patient a 2019 Calendar book RN discussed the zones and action plan of CHF  RN sent Living  better with heart failure RN discussed importance of weighing with CHF RN will follow up follow up within the month of July. RN sent barriers and assessment letter to [facility] [phone]

## 2017-07-29 DIAGNOSIS — D631 Anemia in chronic kidney disease: Secondary | ICD-10-CM | POA: Diagnosis not present

## 2017-07-29 DIAGNOSIS — I214 Non-ST elevation (NSTEMI) myocardial infarction: Secondary | ICD-10-CM | POA: Diagnosis not present

## 2017-07-29 DIAGNOSIS — E1122 Type 2 diabetes mellitus with diabetic chronic kidney disease: Secondary | ICD-10-CM | POA: Diagnosis not present

## 2017-07-29 DIAGNOSIS — I130 Hypertensive heart and chronic kidney disease with heart failure and stage 1 through stage 4 chronic kidney disease, or unspecified chronic kidney disease: Secondary | ICD-10-CM | POA: Diagnosis not present

## 2017-07-29 DIAGNOSIS — I5023 Acute on chronic systolic (congestive) heart failure: Secondary | ICD-10-CM | POA: Diagnosis not present

## 2017-07-29 DIAGNOSIS — N184 Chronic kidney disease, stage 4 (severe): Secondary | ICD-10-CM | POA: Diagnosis not present

## 2017-08-01 ENCOUNTER — Telehealth: Payer: Self-pay | Admitting: Cardiovascular Disease

## 2017-08-01 NOTE — Telephone Encounter (Signed)
OK to hold Plavix 5 days before planned procedure. Resume when safe from surgical standpoint.   [name] 08/01/2017 3:16 PM

## 2017-08-01 NOTE — Telephone Encounter (Signed)
I left information from Dr. Angelena Form on [facility] voicemail.  Message left note was available in Epic and to call our office if any questions.

## 2017-08-01 NOTE — Telephone Encounter (Signed)
New Message:      Doristine Bosworth from [facility] is calling and would like to speak with a RN about this patient.

## 2017-08-01 NOTE — Telephone Encounter (Signed)
   [facility] Pre-operative Risk Assessment    I spoke with [place] who is asking about Plavix as outlined below  1. What type of surgery is being performed? Peritoneal dialysis catheter insertion  2. When is this surgery scheduled? July 9,2019   3. What type of clearance is required (medical clearance vs. Pharmacy clearance to hold med vs. Both)? Does not require clearance.    4. Are there any medications that need to be held prior to surgery and how long?-- Can pt hold Plavix prior to procedure?  If so how many days does cardiology recommend Plavix be held .  When should pt resume after surgery?  5. Practice name and name of physician performing surgery? Dr. Justin Mend --[facility]   6. What is your office phone number --Call back number is [phone] extension [phone]    7.   What is your office fax number   8.   Anesthesia type (None, local, MAC, general) ?      _________________________________________________________________   (provider comments below)

## 2017-08-03 ENCOUNTER — Other Ambulatory Visit: Payer: Medicare HMO | Admitting: *Deleted

## 2017-08-03 DIAGNOSIS — E7800 Pure hypercholesterolemia, unspecified: Secondary | ICD-10-CM

## 2017-08-03 LAB — HEPATIC FUNCTION PANEL
ALT: 4 IU/L (ref 0–32)
AST: 7 IU/L (ref 0–40)
Albumin: 4.1 g/dL (ref 3.5–4.8)
Alkaline Phosphatase: 59 IU/L (ref 39–117)
BILIRUBIN TOTAL: 0.2 mg/dL (ref 0.0–1.2)
Bilirubin, Direct: 0.09 mg/dL (ref 0.00–0.40)
Total Protein: 6.3 g/dL (ref 6.0–8.5)

## 2017-08-03 LAB — LIPID PANEL
CHOLESTEROL TOTAL: 170 mg/dL (ref 100–199)
Chol/HDL Ratio: 4.6 ratio — ABNORMAL HIGH (ref 0.0–4.4)
HDL: 37 mg/dL — ABNORMAL LOW (ref >39)
LDL Calculated: 105 mg/dL — ABNORMAL HIGH (ref 0–99)
TRIGLYCERIDES: 142 mg/dL (ref 0–149)
VLDL Cholesterol Cal: 28 mg/dL (ref 5–40)

## 2017-08-05 ENCOUNTER — Telehealth: Payer: Self-pay | Admitting: Pharmacist

## 2017-08-05 DIAGNOSIS — E782 Mixed hyperlipidemia: Secondary | ICD-10-CM

## 2017-08-05 MED ORDER — ROSUVASTATIN CALCIUM 20 MG PO TABS: 20.0000 mg | ORAL_TABLET | Freq: Every day | ORAL | 11 refills | Status: DC

## 2017-08-05 NOTE — Telephone Encounter (Signed)
Baseline lipids in February on no therapy showed TG 382 and LDL 147. She was started on fenofibrate 48mg  and rosuvastatin 20mg  daily to target TG < 150 and LDL < 70 due to ASCVD history. Lipids 5/26 were much improved however LDL has since increased. Looks as though her rosuvastatin dose was changed from 20mg  to 10mg  at hospital discharge 5/30. CrCl now down to 16. Will need to stop fibrate and will plan to increase rosuvastatin back to 20mg  daily. Plasma concentrations typically 50% higher for patients on dialysis so therapeutic effects may be closer to 40mg  daily. Will advise pt to monitor for myalgias and will recheck lipids, LFTs, and BMET in 6 weeks.

## 2017-08-07 NOTE — Progress Notes (Signed)
Cardiology Office Note:    Date:  08/08/2017   ID:  ERNEST ORR, DOB 03-07-1945, MRN [id number]  PCP:  [name], MD  Cardiologist:  [name], MD  Electrophysiologist: Cristopher Peru, MD Pulmonologist:  Dr. Vaughan Browner Nephrologist:  Dr. Justin Mend   Vascular surgeon:  Dr. Donnetta Hutching  Referring MD: [name], MD   Chief Complaint  Patient presents with  . Hospitalization Follow-up    2 recent admissions with non-ST elevation myocardial infarction and decompensated heart failure    History of Present Illness:    Kristen Bradley is a 72 y.o. female with coronary artery disease, combined systolic and diastolic congestive heart failure secondary to ischemic cardiomyopathy s/p CRT-ICD, prior CVA, peripheral vascular disease s/p prior renal artery stenting and prior bilateral CEA, CKD stage 5, hypertension, hyperlipidemia, diabetes, tobacco abuse, prior GI bleeding, abdominal aortic aneurysm.  Cardiac Catheterization in 02/2016 demonstrated multivessel coronary artery disease and she was not fel to be a candidate for PCI or CABG.  EF by Echo in 2/19 demonstrated improved LVF with EF 55-60.    Admitted in 06/2017 with decompensated congestive heart failure and NSTEMI complicated by AKI on CKD and pneumonia.  Medical therapy was adjusted for CAD.  Of note, she was evaluated with a PET scan during that admission for a RLL lung nodule.  There was slow uptake but the nodule could still be a slow growing cancer.  There was also uptake in the abdomen and follow up with Gastroenterology was recommended.    Admitted again 6/8-6/11 with decompensated congestive heart failure and unstable angina.  She is not on ASA due to hx of GI bleeding.  She had a follow up echocardiogram that demonstrated worsening LVF with EF 20-25 and wall motion abnormalities suggestive of worsening CAD.  Diltiazem was stopped due to worsening LVF.  She was followed by Nephrology and the patient was leaning towards peritoneal  dialysis.    Kristen Bradley returns for follow up.  She is here with her son.  She is scheduled to have a peritoneal dialysis catheter placed tomorrow at [facility].  She is overall doing well.  She denies chest pain, syncope, paroxysmal nocturnal dyspnea, edema.  She denies significant dyspnea on exertion.    Prior CV studies:   The following studies were reviewed today:  Echo 07/10/17 EF 20-25, diff HK with disproportionate severe ant-apical and lat HK, Gr 2 DD, mild to mod MR, mild LAE, mild to mod TR, PASP 55, unusual brisk systolic and diastolic flow seen in ostium of RCA (consider coronary AV fistula)  Echo 03/14/17 Mild LVH, EF 55-60, Gr 1 DD, PASP 34  Carotid US 10/29/16 ([facility]) R CEA patent with 1-39% stenosis L CEA patent  Echo 10/13/16 Mild LVH, EF 25-30, diff K, mild MR, mild LAE  Cardiac Catheterization 02/03/16 LM ost 50 LAD ost 20, mid 90, 20, dist 80; D1 90 LCx ost 70, mid 20; OM2 ost 20, 50; OM3 ost 99 RCA ost 60, mid 13, dist 60; RPDA 99  Past Medical History:  Diagnosis Date  . 3-vessel CAD - treated medically  02/26/2016  . Acute renal failure superimposed on stage 3 chronic kidney disease ([place])   . Adrenal tumor 08/2007   surgery  . Aftercare following surgery of the circulatory system, [place] 05/26/2012  . AKI (acute kidney injury) ([place])   . Atherosclerosis of native artery of extremity with intermittent claudication ([place]) 01/26/2013  . CAD, multiple vessel   . Cancer of right breast ([place])  s/p lumpectomy, XRT  . Cardiomyopathy, ischemic   . Carotid artery disease (HCC)    s/p bilateral CEA  . Carotid stenosis, bilateral-s/p Bilateral CEA 11/05/2011  . Chronic ITP (idiopathic thrombocytopenia) (HCC) 02/24/2016  . Chronic systolic (congestive) heart failure ([place]) 11/05/2016  . Chronic systolic heart failure ([place]) 03/03/2016  . CKD (chronic kidney disease) stage 4, GFR 15-29 ml/min (HCC)   . Dyspnea   . Elevated brain natriuretic peptide (BNP) level 01/30/2016  . Elevated  troponin I level 01/30/2016  . Hepatitis C    "got treatment for it" (01/29/2016)  . HFrEF (heart failure with reduced ejection fraction) ([place])   . High cholesterol    takes Lopid daily  . History of UTI    takes Diflucan dail--pt states no uti in a couple of yrs though  . Hypertension    takes Losartan,Amlodipine,HCTZ,and Atenolol daily and Clatarpres  . Intermittent claudication ([place]) 11/05/2011  . Ischemic cardiomyopathy    a. 11/2016 s/p MDT BiV ICD w/ his bundle pacing.  . Joint pain    legs  . LBBB- new 01/30/2016  . NSTEMI (non-ST elevated myocardial infarction) ([place])   . Occlusion and stenosis of carotid artery without mention of cerebral infarction 12/17/2011  . PAD (peripheral artery disease) ([place]) 11/26/2011  . Pain in limb 09/08/2012  . Peripheral vascular disease ([place]) 11/05/2011  . Renal artery stenosis (HCC)    Right renal artery stent 05/31/06  . Renal failure syndrome   . Sinus bradycardia   . Sinus pause   . Stroke [facility]) Jan. 7, 2014  . Thrombocytopenia- plts 81K 01/30/2016  . Tobacco abuse   . Type 2 diabetes mellitus with vascular disease ([place])   . Type II diabetes mellitus ([place])    Surgical Hx: The patient  has a past surgical history that includes Adrenal gland surgery (2009); Endarterectomy (01/05/2012); Endarterectomy (02/08/2012); Patch angioplasty (02/08/2012); Breast lumpectomy (Right, 2009); Lumbar disc surgery; Abdominal hysterectomy; Renal artery stent (Right, 05/2006); Cardiac catheterization (N/A, 02/03/2016); and BIV ICD INSERTION CRT-D (N/A, 11/05/2016).   Current Medications: Current Meds  Medication Sig  . acetaminophen (TYLENOL) 500 MG tablet Take 1,000 mg by mouth every 6 (six) hours as needed for mild pain or headache.   . carvedilol (COREG) 6.25 MG tablet Take 1 tablet (6.25 mg total) by mouth 2 (two) times daily with a meal.  . clopidogrel (PLAVIX) 75 MG tablet Take 1 tablet (75 mg total) by mouth daily.  . furosemide (LASIX) 80 MG tablet Take 1  tablet (80 mg total) by mouth 2 (two) times daily.  Marland Kitchen glipiZIDE (GLUCOTROL) 5 MG tablet Take 2.5 mg by mouth daily before breakfast.   . hydrALAZINE (APRESOLINE) 25 MG tablet Take 1 tablet (25 mg total) by mouth 3 (three) times daily.  . IRON PO Take 1 tablet by mouth daily.  . isosorbide mononitrate (IMDUR) 60 MG 24 hr tablet Take 1 tablet (60 mg total) by mouth daily.  . nitroGLYCERIN (NITROSTAT) 0.4 MG SL tablet Place 1 tablet (0.4 mg total) under the tongue every 5 (five) minutes as needed for chest pain.  . polyethylene glycol (MIRALAX / GLYCOLAX) packet Take 17 g by mouth daily as needed for mild constipation or moderate constipation.  . rosuvastatin (CRESTOR) 20 MG tablet Take 1 tablet (20 mg total) by mouth daily.     Allergies:   Chlorhexidine gluconate; Ace inhibitors; and Dilaudid [hydromorphone hcl]   Social History   Tobacco Use  . Smoking status: Former Smoker  Packs/day: 0.50    Years: 53.00    Pack years: 26.50    Types: Cigarettes    Last attempt to quit: 03/2016    Years since quitting: 1.4  . Smokeless tobacco: Never Used  Substance Use Topics  . Alcohol use: No    Alcohol/week: 0.0 oz  . Drug use: No     Family Hx: The patient's family history includes Cancer in her father and mother; Diabetes in her brother and sister; Hypertension in her brother and sister. There is no history of CAD.  ROS:   Please see the history of present illness.    ROS All other systems reviewed and are negative.   EKGs/Labs/Other Test Reviewed:    EKG:  EKG is  ordered today.  The ekg ordered today demonstrates ventricular paced, underlying sinus rhythm, heart rate 70, PVC  Recent Labs: 07/09/2017: B Natriuretic Peptide 1,552.5; Magnesium 2.4; TSH 2.513 07/12/2017: BUN 53; Creatinine, Ser 3.76; Hemoglobin 8.7; Platelets 103; Potassium 4.5; Sodium 139 08/03/2017: ALT 4   Recent Lipid Panel Lab Results  Component Value Date/Time   CHOL 170 08/03/2017 08:11 AM   TRIG 142  08/03/2017 08:11 AM   HDL 37 (L) 08/03/2017 08:11 AM   CHOLHDL 4.6 (H) 08/03/2017 08:11 AM   CHOLHDL 4.7 06/26/2017 02:55 AM   LDLCALC 105 (H) 08/03/2017 08:11 AM    Physical Exam:    VS:  BP (!) 152/72   Pulse 70   Ht 5\' 6"  (1.676 m)   Wt 147 lb 6.4 oz (66.9 kg)   SpO2 99%   BMI 23.79 kg/m     Wt Readings from Last 3 Encounters:  08/08/17 147 lb 6.4 oz (66.9 kg)  07/12/17 149 lb 14.4 oz (68 kg)  06/29/17 150 lb 9.6 oz (68.3 kg)     Physical Exam  Constitutional: She is oriented to person, place, and time. She appears well-developed and well-nourished. No distress.  HENT:  Head: Normocephalic and atraumatic.  Neck: No JVD present.  Cardiovascular: Normal rate and regular rhythm.  Murmur heard.  Low-pitched systolic murmur is present with a grade of 2/6 at the upper left sternal border. Pulmonary/Chest: She has no rales.  Abdominal: Soft. There is no hepatomegaly.  Musculoskeletal: She exhibits no edema.  Neurological: She is alert and oriented to person, place, and time.  Skin: Skin is warm and dry.    ASSESSMENT & PLAN:    Chronic systolic heart failure (HCC) Ischemic cardiomyopathy.  EF 20-25 by echo in June 2019.  She is NYHA 2.  She is not on ACE inhibitor or ARB secondary to advanced chronic kidney disease.  Continue current dose of carvedilol, hydralazine, nitrates.  Volume status is currently stable.  Continue current dose of Lasix.  She is due for peritoneal dialysis catheter placement tomorrow.  Coronary artery disease with angina pectoris (HCC)  Multivessel CAD.  She is not a candidate for PCI or CABG.  She is managed medically.  She is currently doing well without angina on carvedilol, isosorbide.  Continue statin.  PAD (peripheral artery disease) (HCC) Continue follow-up with vascular surgery as planned.  Type 2 diabetes mellitus with vascular disease ([place]) Follow-up with primary care as planned.  Essential hypertension Blood pressure above target.   She has not taken any medications yet today.  Continue to monitor.  Solitary pulmonary nodule She had a PET scan in the hospital.  There was also questionable uptake in the gut.  She needs follow-up with pulmonology.  I have  asked her to contact their office to make sure she has follow-up arranged.  She can follow-up with primary care to discuss whether or not to see gastroenterology.  CKD (chronic kidney disease) stage 4, GFR 15-29 ml/min (HCC) Peritoneal dialysis catheter will be placed tomorrow.  She will likely start dialysis soon.  Continue follow-up with nephrology as planned.  Biventricular ICD (implantable cardioverter-defibrillator) in place Follow-up with EP as planned.   Dispo:  Return in about 3 months (around 11/08/2017) for Routine Follow Up w/ Dr. Angelena Form.   Medication Adjustments/Labs and Tests Ordered: Current medicines are reviewed at length with the patient today.  Concerns regarding medicines are outlined above.  Tests Ordered: Orders Placed This Encounter  Procedures  . EKG 12-Lead   Medication Changes: No orders of the defined types were placed in this encounter.   Signed, Richardson Dopp, PA-C  08/08/2017 9:37 AM    [facility] [place], [place], [place]  [address] Phone: [phone]; Fax: [phone]

## 2017-08-08 ENCOUNTER — Encounter: Payer: Self-pay | Admitting: Physician Assistant

## 2017-08-08 ENCOUNTER — Telehealth: Payer: Self-pay

## 2017-08-08 ENCOUNTER — Ambulatory Visit: Payer: Medicare HMO | Admitting: Physician Assistant

## 2017-08-08 VITALS — BP 152/72 | HR 70 | Ht 66.0 in | Wt 147.4 lb

## 2017-08-08 DIAGNOSIS — I10 Essential (primary) hypertension: Secondary | ICD-10-CM | POA: Diagnosis not present

## 2017-08-08 DIAGNOSIS — I739 Peripheral vascular disease, unspecified: Secondary | ICD-10-CM

## 2017-08-08 DIAGNOSIS — D631 Anemia in chronic kidney disease: Secondary | ICD-10-CM | POA: Diagnosis not present

## 2017-08-08 DIAGNOSIS — I5022 Chronic systolic (congestive) heart failure: Secondary | ICD-10-CM | POA: Diagnosis not present

## 2017-08-08 DIAGNOSIS — R911 Solitary pulmonary nodule: Secondary | ICD-10-CM | POA: Diagnosis not present

## 2017-08-08 DIAGNOSIS — I25119 Atherosclerotic heart disease of native coronary artery with unspecified angina pectoris: Secondary | ICD-10-CM

## 2017-08-08 DIAGNOSIS — I214 Non-ST elevation (NSTEMI) myocardial infarction: Secondary | ICD-10-CM | POA: Diagnosis not present

## 2017-08-08 DIAGNOSIS — N184 Chronic kidney disease, stage 4 (severe): Secondary | ICD-10-CM | POA: Diagnosis not present

## 2017-08-08 DIAGNOSIS — E1159 Type 2 diabetes mellitus with other circulatory complications: Secondary | ICD-10-CM | POA: Diagnosis not present

## 2017-08-08 DIAGNOSIS — Z95810 Presence of automatic (implantable) cardiac defibrillator: Secondary | ICD-10-CM | POA: Diagnosis not present

## 2017-08-08 DIAGNOSIS — I130 Hypertensive heart and chronic kidney disease with heart failure and stage 1 through stage 4 chronic kidney disease, or unspecified chronic kidney disease: Secondary | ICD-10-CM | POA: Diagnosis not present

## 2017-08-08 DIAGNOSIS — I5023 Acute on chronic systolic (congestive) heart failure: Secondary | ICD-10-CM | POA: Diagnosis not present

## 2017-08-08 DIAGNOSIS — E1122 Type 2 diabetes mellitus with diabetic chronic kidney disease: Secondary | ICD-10-CM | POA: Diagnosis not present

## 2017-08-08 NOTE — Telephone Encounter (Signed)
-----   Message from Marshell Garfinkel, MD sent at 08/08/2017  1:35 PM EDT ----- Thanks for the update.  Margie- can you make sure she has follow up in clinic at next available.  ----- Message ----- From: Sharmon Revere Sent: 08/08/2017   9:38 AM To: Marshell Garfinkel, MD

## 2017-08-08 NOTE — Telephone Encounter (Signed)
LMTCB x1 for pt.  

## 2017-08-08 NOTE — Patient Instructions (Addendum)
Medication Instructions:  1. Your physician recommends that you continue on your current medications as directed. Please refer to the Current Medication list given to you today.   Labwork: NONE ORDERED TODAY  Testing/Procedures: NONE ORDERED TODAY  Follow-Up: DR. Angelena Form ON November 16, 2017 @ 10 AM   Any Other Special Instructions Will Be Listed Below (If Applicable). PER [facility],PAC YOU WILL NEED TO CALL YOU PULMONARY (LUNG) DOCTOR TO MAKE SURE YOU HAVE A FOLLOW UP APPT    If you need a refill on your cardiac medications before your next appointment, please call your pharmacy.

## 2017-08-09 DIAGNOSIS — I120 Hypertensive chronic kidney disease with stage 5 chronic kidney disease or end stage renal disease: Secondary | ICD-10-CM | POA: Diagnosis not present

## 2017-08-09 DIAGNOSIS — N184 Chronic kidney disease, stage 4 (severe): Secondary | ICD-10-CM | POA: Diagnosis not present

## 2017-08-09 DIAGNOSIS — Z992 Dependence on renal dialysis: Secondary | ICD-10-CM | POA: Diagnosis not present

## 2017-08-09 DIAGNOSIS — N19 Unspecified kidney failure: Secondary | ICD-10-CM | POA: Diagnosis not present

## 2017-08-09 DIAGNOSIS — N189 Chronic kidney disease, unspecified: Secondary | ICD-10-CM | POA: Diagnosis not present

## 2017-08-09 DIAGNOSIS — E8770 Fluid overload, unspecified: Secondary | ICD-10-CM | POA: Diagnosis not present

## 2017-08-09 DIAGNOSIS — N186 End stage renal disease: Secondary | ICD-10-CM | POA: Diagnosis not present

## 2017-08-09 DIAGNOSIS — Z4902 Encounter for fitting and adjustment of peritoneal dialysis catheter: Secondary | ICD-10-CM | POA: Diagnosis not present

## 2017-08-09 NOTE — Telephone Encounter (Signed)
LMTCB x2 for pt 

## 2017-08-12 DIAGNOSIS — E1122 Type 2 diabetes mellitus with diabetic chronic kidney disease: Secondary | ICD-10-CM | POA: Diagnosis not present

## 2017-08-12 DIAGNOSIS — N184 Chronic kidney disease, stage 4 (severe): Secondary | ICD-10-CM | POA: Diagnosis not present

## 2017-08-12 DIAGNOSIS — I214 Non-ST elevation (NSTEMI) myocardial infarction: Secondary | ICD-10-CM | POA: Diagnosis not present

## 2017-08-12 DIAGNOSIS — I130 Hypertensive heart and chronic kidney disease with heart failure and stage 1 through stage 4 chronic kidney disease, or unspecified chronic kidney disease: Secondary | ICD-10-CM | POA: Diagnosis not present

## 2017-08-12 DIAGNOSIS — D631 Anemia in chronic kidney disease: Secondary | ICD-10-CM | POA: Diagnosis not present

## 2017-08-12 DIAGNOSIS — I5023 Acute on chronic systolic (congestive) heart failure: Secondary | ICD-10-CM | POA: Diagnosis not present

## 2017-08-16 ENCOUNTER — Ambulatory Visit (INDEPENDENT_AMBULATORY_CARE_PROVIDER_SITE_OTHER): Payer: Medicare HMO | Admitting: *Deleted

## 2017-08-16 DIAGNOSIS — I255 Ischemic cardiomyopathy: Secondary | ICD-10-CM

## 2017-08-16 NOTE — Progress Notes (Signed)
Remote ICD transmission.   

## 2017-08-17 ENCOUNTER — Encounter: Payer: Self-pay | Admitting: Pulmonary Disease

## 2017-08-17 ENCOUNTER — Encounter: Payer: Self-pay | Admitting: Cardiology

## 2017-08-17 DIAGNOSIS — N189 Chronic kidney disease, unspecified: Secondary | ICD-10-CM | POA: Diagnosis not present

## 2017-08-17 DIAGNOSIS — Z8619 Personal history of other infectious and parasitic diseases: Secondary | ICD-10-CM | POA: Diagnosis not present

## 2017-08-17 DIAGNOSIS — E1121 Type 2 diabetes mellitus with diabetic nephropathy: Secondary | ICD-10-CM | POA: Diagnosis not present

## 2017-08-17 DIAGNOSIS — I5022 Chronic systolic (congestive) heart failure: Secondary | ICD-10-CM | POA: Diagnosis not present

## 2017-08-17 DIAGNOSIS — I120 Hypertensive chronic kidney disease with stage 5 chronic kidney disease or end stage renal disease: Secondary | ICD-10-CM | POA: Diagnosis not present

## 2017-08-17 DIAGNOSIS — N2581 Secondary hyperparathyroidism of renal origin: Secondary | ICD-10-CM | POA: Diagnosis not present

## 2017-08-17 DIAGNOSIS — D631 Anemia in chronic kidney disease: Secondary | ICD-10-CM | POA: Diagnosis not present

## 2017-08-17 DIAGNOSIS — Z95810 Presence of automatic (implantable) cardiac defibrillator: Secondary | ICD-10-CM | POA: Diagnosis not present

## 2017-08-17 DIAGNOSIS — N185 Chronic kidney disease, stage 5: Secondary | ICD-10-CM | POA: Diagnosis not present

## 2017-08-17 NOTE — Telephone Encounter (Signed)
lmtcb x3 for pt.  Letter has been mailed to address on file.  Nothing further is needed.

## 2017-08-18 DIAGNOSIS — N185 Chronic kidney disease, stage 5: Secondary | ICD-10-CM | POA: Diagnosis not present

## 2017-08-19 LAB — CUP PACEART REMOTE DEVICE CHECK
Brady Statistic AP VP Percent: 17.36 %
Brady Statistic AP VS Percent: 0.01 %
Brady Statistic AS VP Percent: 81.98 %
Date Time Interrogation Session: 20190716041804
HighPow Impedance: 69 Ohm
Implantable Lead Implant Date: 20181005
Implantable Lead Location: 753860
Implantable Lead Model: 3830
Implantable Lead Model: 5076
Implantable Lead Model: 6935
Lead Channel Impedance Value: 304 Ohm
Lead Channel Impedance Value: 532 Ohm
Lead Channel Impedance Value: 589 Ohm
Lead Channel Pacing Threshold Amplitude: 0.625 V
Lead Channel Sensing Intrinsic Amplitude: 20.75 mV
Lead Channel Sensing Intrinsic Amplitude: 5.125 mV
Lead Channel Sensing Intrinsic Amplitude: 5.125 mV
Lead Channel Setting Pacing Amplitude: 2.5 V
Lead Channel Setting Sensing Sensitivity: 0.3 mV
MDC IDC LEAD IMPLANT DT: 20181005
MDC IDC LEAD IMPLANT DT: 20181005
MDC IDC LEAD LOCATION: 753859
MDC IDC LEAD LOCATION: 753860
MDC IDC MSMT BATTERY REMAINING LONGEVITY: 76 mo
MDC IDC MSMT BATTERY VOLTAGE: 2.98 V
MDC IDC MSMT LEADCHNL LV IMPEDANCE VALUE: 304 Ohm
MDC IDC MSMT LEADCHNL RA PACING THRESHOLD PULSEWIDTH: 0.4 ms
MDC IDC MSMT LEADCHNL RV IMPEDANCE VALUE: 304 Ohm
MDC IDC MSMT LEADCHNL RV IMPEDANCE VALUE: 361 Ohm
MDC IDC MSMT LEADCHNL RV SENSING INTR AMPL: 20.75 mV
MDC IDC PG IMPLANT DT: 20181005
MDC IDC SET LEADCHNL LV PACING PULSEWIDTH: 1 ms
MDC IDC SET LEADCHNL RA PACING AMPLITUDE: 1.5 V
MDC IDC SET LEADCHNL RV PACING AMPLITUDE: 0.5 V
MDC IDC SET LEADCHNL RV PACING PULSEWIDTH: 0.03 ms
MDC IDC STAT BRADY AS VS PERCENT: 0.66 %
MDC IDC STAT BRADY RA PERCENT PACED: 17.12 %
MDC IDC STAT BRADY RV PERCENT PACED: 97.93 %

## 2017-08-23 DIAGNOSIS — N185 Chronic kidney disease, stage 5: Secondary | ICD-10-CM | POA: Diagnosis not present

## 2017-08-23 DIAGNOSIS — I6523 Occlusion and stenosis of bilateral carotid arteries: Secondary | ICD-10-CM | POA: Diagnosis not present

## 2017-08-23 DIAGNOSIS — E119 Type 2 diabetes mellitus without complications: Secondary | ICD-10-CM | POA: Diagnosis not present

## 2017-08-23 DIAGNOSIS — I714 Abdominal aortic aneurysm, without rupture: Secondary | ICD-10-CM | POA: Diagnosis not present

## 2017-08-23 DIAGNOSIS — I255 Ischemic cardiomyopathy: Secondary | ICD-10-CM | POA: Diagnosis not present

## 2017-08-23 DIAGNOSIS — I7409 Other arterial embolism and thrombosis of abdominal aorta: Secondary | ICD-10-CM | POA: Diagnosis not present

## 2017-08-23 DIAGNOSIS — I2584 Coronary atherosclerosis due to calcified coronary lesion: Secondary | ICD-10-CM | POA: Diagnosis not present

## 2017-08-23 DIAGNOSIS — I5022 Chronic systolic (congestive) heart failure: Secondary | ICD-10-CM | POA: Diagnosis not present

## 2017-08-23 DIAGNOSIS — E785 Hyperlipidemia, unspecified: Secondary | ICD-10-CM | POA: Diagnosis not present

## 2017-08-23 DIAGNOSIS — Z9889 Other specified postprocedural states: Secondary | ICD-10-CM | POA: Diagnosis not present

## 2017-08-23 DIAGNOSIS — I10 Essential (primary) hypertension: Secondary | ICD-10-CM | POA: Diagnosis not present

## 2017-08-29 ENCOUNTER — Other Ambulatory Visit: Payer: Self-pay | Admitting: *Deleted

## 2017-08-29 DIAGNOSIS — N186 End stage renal disease: Secondary | ICD-10-CM | POA: Diagnosis not present

## 2017-08-29 DIAGNOSIS — N2581 Secondary hyperparathyroidism of renal origin: Secondary | ICD-10-CM | POA: Diagnosis not present

## 2017-08-29 NOTE — Patient Outreach (Signed)
[facility]) Care Management  08/29/2017  Kristen Bradley [birth year] [id number]  RN Health Coach attempted #1 follow up outreach call to patient.  Patient was unavailable. No voicemail message left. No voicemail pick up.  Plan: RN will call patient again within 10 business days.  [place] Management [phone] .

## 2017-08-30 DIAGNOSIS — N186 End stage renal disease: Secondary | ICD-10-CM | POA: Diagnosis not present

## 2017-08-30 DIAGNOSIS — N2581 Secondary hyperparathyroidism of renal origin: Secondary | ICD-10-CM | POA: Diagnosis not present

## 2017-08-31 DIAGNOSIS — N186 End stage renal disease: Secondary | ICD-10-CM | POA: Diagnosis not present

## 2017-08-31 DIAGNOSIS — N2581 Secondary hyperparathyroidism of renal origin: Secondary | ICD-10-CM | POA: Diagnosis not present

## 2017-09-01 DIAGNOSIS — N2581 Secondary hyperparathyroidism of renal origin: Secondary | ICD-10-CM | POA: Diagnosis not present

## 2017-09-01 DIAGNOSIS — N186 End stage renal disease: Secondary | ICD-10-CM | POA: Diagnosis not present

## 2017-09-02 DIAGNOSIS — N2581 Secondary hyperparathyroidism of renal origin: Secondary | ICD-10-CM | POA: Diagnosis not present

## 2017-09-02 DIAGNOSIS — N186 End stage renal disease: Secondary | ICD-10-CM | POA: Diagnosis not present

## 2017-09-05 DIAGNOSIS — N186 End stage renal disease: Secondary | ICD-10-CM | POA: Diagnosis not present

## 2017-09-05 DIAGNOSIS — N2581 Secondary hyperparathyroidism of renal origin: Secondary | ICD-10-CM | POA: Diagnosis not present

## 2017-09-06 DIAGNOSIS — N2581 Secondary hyperparathyroidism of renal origin: Secondary | ICD-10-CM | POA: Diagnosis not present

## 2017-09-06 DIAGNOSIS — N186 End stage renal disease: Secondary | ICD-10-CM | POA: Diagnosis not present

## 2017-09-07 DIAGNOSIS — N2581 Secondary hyperparathyroidism of renal origin: Secondary | ICD-10-CM | POA: Diagnosis not present

## 2017-09-07 DIAGNOSIS — N186 End stage renal disease: Secondary | ICD-10-CM | POA: Diagnosis not present

## 2017-09-08 ENCOUNTER — Ambulatory Visit: Payer: Self-pay | Admitting: *Deleted

## 2017-09-08 DIAGNOSIS — N186 End stage renal disease: Secondary | ICD-10-CM | POA: Diagnosis not present

## 2017-09-08 DIAGNOSIS — N2581 Secondary hyperparathyroidism of renal origin: Secondary | ICD-10-CM | POA: Diagnosis not present

## 2017-09-09 DIAGNOSIS — N186 End stage renal disease: Secondary | ICD-10-CM | POA: Diagnosis not present

## 2017-09-09 DIAGNOSIS — N2581 Secondary hyperparathyroidism of renal origin: Secondary | ICD-10-CM | POA: Diagnosis not present

## 2017-09-12 DIAGNOSIS — N2581 Secondary hyperparathyroidism of renal origin: Secondary | ICD-10-CM | POA: Diagnosis not present

## 2017-09-12 DIAGNOSIS — N186 End stage renal disease: Secondary | ICD-10-CM | POA: Diagnosis not present

## 2017-09-13 DIAGNOSIS — N186 End stage renal disease: Secondary | ICD-10-CM | POA: Diagnosis not present

## 2017-09-13 DIAGNOSIS — N2581 Secondary hyperparathyroidism of renal origin: Secondary | ICD-10-CM | POA: Diagnosis not present

## 2017-09-14 DIAGNOSIS — N186 End stage renal disease: Secondary | ICD-10-CM | POA: Diagnosis not present

## 2017-09-14 DIAGNOSIS — N2581 Secondary hyperparathyroidism of renal origin: Secondary | ICD-10-CM | POA: Diagnosis not present

## 2017-09-15 DIAGNOSIS — N2581 Secondary hyperparathyroidism of renal origin: Secondary | ICD-10-CM | POA: Diagnosis not present

## 2017-09-15 DIAGNOSIS — N186 End stage renal disease: Secondary | ICD-10-CM | POA: Diagnosis not present

## 2017-09-16 ENCOUNTER — Other Ambulatory Visit: Payer: Self-pay | Admitting: Cardiovascular Disease

## 2017-09-16 DIAGNOSIS — N186 End stage renal disease: Secondary | ICD-10-CM | POA: Diagnosis not present

## 2017-09-16 DIAGNOSIS — Z992 Dependence on renal dialysis: Secondary | ICD-10-CM | POA: Diagnosis not present

## 2017-09-16 NOTE — Telephone Encounter (Signed)
Spoke with patient to verify that she is still taking this medication at 80 mg bid and she is. She is aware that I will send in the rx as requested.

## 2017-09-17 DIAGNOSIS — N2581 Secondary hyperparathyroidism of renal origin: Secondary | ICD-10-CM | POA: Diagnosis not present

## 2017-09-17 DIAGNOSIS — N186 End stage renal disease: Secondary | ICD-10-CM | POA: Diagnosis not present

## 2017-09-18 DIAGNOSIS — N2581 Secondary hyperparathyroidism of renal origin: Secondary | ICD-10-CM | POA: Diagnosis not present

## 2017-09-18 DIAGNOSIS — N186 End stage renal disease: Secondary | ICD-10-CM | POA: Diagnosis not present

## 2017-09-19 DIAGNOSIS — N186 End stage renal disease: Secondary | ICD-10-CM | POA: Diagnosis not present

## 2017-09-19 DIAGNOSIS — N2581 Secondary hyperparathyroidism of renal origin: Secondary | ICD-10-CM | POA: Diagnosis not present

## 2017-09-20 DIAGNOSIS — N2581 Secondary hyperparathyroidism of renal origin: Secondary | ICD-10-CM | POA: Diagnosis not present

## 2017-09-20 DIAGNOSIS — N186 End stage renal disease: Secondary | ICD-10-CM | POA: Diagnosis not present

## 2017-09-21 ENCOUNTER — Other Ambulatory Visit: Payer: Self-pay | Admitting: Family Medicine

## 2017-09-21 DIAGNOSIS — N186 End stage renal disease: Secondary | ICD-10-CM | POA: Diagnosis not present

## 2017-09-21 DIAGNOSIS — N2581 Secondary hyperparathyroidism of renal origin: Secondary | ICD-10-CM | POA: Diagnosis not present

## 2017-09-21 DIAGNOSIS — Z1231 Encounter for screening mammogram for malignant neoplasm of breast: Secondary | ICD-10-CM

## 2017-09-22 DIAGNOSIS — N186 End stage renal disease: Secondary | ICD-10-CM | POA: Diagnosis not present

## 2017-09-22 DIAGNOSIS — N2581 Secondary hyperparathyroidism of renal origin: Secondary | ICD-10-CM | POA: Diagnosis not present

## 2017-09-23 DIAGNOSIS — M25572 Pain in left ankle and joints of left foot: Secondary | ICD-10-CM | POA: Diagnosis not present

## 2017-09-23 DIAGNOSIS — N2581 Secondary hyperparathyroidism of renal origin: Secondary | ICD-10-CM | POA: Diagnosis not present

## 2017-09-23 DIAGNOSIS — M79672 Pain in left foot: Secondary | ICD-10-CM | POA: Diagnosis not present

## 2017-09-23 DIAGNOSIS — N186 End stage renal disease: Secondary | ICD-10-CM | POA: Diagnosis not present

## 2017-09-24 DIAGNOSIS — N2581 Secondary hyperparathyroidism of renal origin: Secondary | ICD-10-CM | POA: Diagnosis not present

## 2017-09-24 DIAGNOSIS — N186 End stage renal disease: Secondary | ICD-10-CM | POA: Diagnosis not present

## 2017-09-25 DIAGNOSIS — N186 End stage renal disease: Secondary | ICD-10-CM | POA: Diagnosis not present

## 2017-09-25 DIAGNOSIS — N2581 Secondary hyperparathyroidism of renal origin: Secondary | ICD-10-CM | POA: Diagnosis not present

## 2017-09-26 ENCOUNTER — Other Ambulatory Visit: Payer: Medicare HMO

## 2017-09-26 DIAGNOSIS — N186 End stage renal disease: Secondary | ICD-10-CM | POA: Diagnosis not present

## 2017-09-26 DIAGNOSIS — N2581 Secondary hyperparathyroidism of renal origin: Secondary | ICD-10-CM | POA: Diagnosis not present

## 2017-09-27 DIAGNOSIS — N2581 Secondary hyperparathyroidism of renal origin: Secondary | ICD-10-CM | POA: Diagnosis not present

## 2017-09-27 DIAGNOSIS — N186 End stage renal disease: Secondary | ICD-10-CM | POA: Diagnosis not present

## 2017-09-28 DIAGNOSIS — N2581 Secondary hyperparathyroidism of renal origin: Secondary | ICD-10-CM | POA: Diagnosis not present

## 2017-09-28 DIAGNOSIS — N186 End stage renal disease: Secondary | ICD-10-CM | POA: Diagnosis not present

## 2017-09-29 DIAGNOSIS — N2581 Secondary hyperparathyroidism of renal origin: Secondary | ICD-10-CM | POA: Diagnosis not present

## 2017-09-29 DIAGNOSIS — N186 End stage renal disease: Secondary | ICD-10-CM | POA: Diagnosis not present

## 2017-09-30 DIAGNOSIS — N186 End stage renal disease: Secondary | ICD-10-CM | POA: Diagnosis not present

## 2017-09-30 DIAGNOSIS — N2581 Secondary hyperparathyroidism of renal origin: Secondary | ICD-10-CM | POA: Diagnosis not present

## 2017-10-01 DIAGNOSIS — E1122 Type 2 diabetes mellitus with diabetic chronic kidney disease: Secondary | ICD-10-CM | POA: Diagnosis not present

## 2017-10-01 DIAGNOSIS — Z992 Dependence on renal dialysis: Secondary | ICD-10-CM | POA: Diagnosis not present

## 2017-10-01 DIAGNOSIS — N186 End stage renal disease: Secondary | ICD-10-CM | POA: Diagnosis not present

## 2017-10-01 DIAGNOSIS — N2581 Secondary hyperparathyroidism of renal origin: Secondary | ICD-10-CM | POA: Diagnosis not present

## 2017-10-02 DIAGNOSIS — N186 End stage renal disease: Secondary | ICD-10-CM | POA: Diagnosis not present

## 2017-10-02 DIAGNOSIS — N2581 Secondary hyperparathyroidism of renal origin: Secondary | ICD-10-CM | POA: Diagnosis not present

## 2017-10-03 DIAGNOSIS — N186 End stage renal disease: Secondary | ICD-10-CM | POA: Diagnosis not present

## 2017-10-03 DIAGNOSIS — N2581 Secondary hyperparathyroidism of renal origin: Secondary | ICD-10-CM | POA: Diagnosis not present

## 2017-10-04 DIAGNOSIS — N2581 Secondary hyperparathyroidism of renal origin: Secondary | ICD-10-CM | POA: Diagnosis not present

## 2017-10-04 DIAGNOSIS — N186 End stage renal disease: Secondary | ICD-10-CM | POA: Diagnosis not present

## 2017-10-05 ENCOUNTER — Other Ambulatory Visit: Payer: Medicare HMO | Admitting: *Deleted

## 2017-10-05 DIAGNOSIS — E782 Mixed hyperlipidemia: Secondary | ICD-10-CM

## 2017-10-05 DIAGNOSIS — N186 End stage renal disease: Secondary | ICD-10-CM | POA: Diagnosis not present

## 2017-10-05 DIAGNOSIS — N2581 Secondary hyperparathyroidism of renal origin: Secondary | ICD-10-CM | POA: Diagnosis not present

## 2017-10-05 LAB — BASIC METABOLIC PANEL
BUN/Creatinine Ratio: 10 — ABNORMAL LOW (ref 12–28)
BUN: 23 mg/dL (ref 8–27)
CALCIUM: 9.3 mg/dL (ref 8.7–10.3)
CHLORIDE: 97 mmol/L (ref 96–106)
CO2: 25 mmol/L (ref 20–29)
Creatinine, Ser: 2.35 mg/dL — ABNORMAL HIGH (ref 0.57–1.00)
GFR calc non Af Amer: 20 mL/min/{1.73_m2} — ABNORMAL LOW (ref >59)
GFR, EST AFRICAN AMERICAN: 23 mL/min/{1.73_m2} — AB (ref >59)
Glucose: 125 mg/dL — ABNORMAL HIGH (ref 65–99)
Potassium: 3 mmol/L — ABNORMAL LOW (ref 3.5–5.2)
Sodium: 141 mmol/L (ref 134–144)

## 2017-10-05 LAB — HEPATIC FUNCTION PANEL
ALT: 7 IU/L (ref 0–32)
AST: 13 IU/L (ref 0–40)
Albumin: 3.7 g/dL (ref 3.5–4.8)
Alkaline Phosphatase: 98 IU/L (ref 39–117)
BILIRUBIN TOTAL: 0.3 mg/dL (ref 0.0–1.2)
Bilirubin, Direct: 0.11 mg/dL (ref 0.00–0.40)
Total Protein: 6 g/dL (ref 6.0–8.5)

## 2017-10-05 LAB — LIPID PANEL
CHOL/HDL RATIO: 4.1 ratio (ref 0.0–4.4)
Cholesterol, Total: 187 mg/dL (ref 100–199)
HDL: 46 mg/dL (ref >39)
LDL CALC: 89 mg/dL (ref 0–99)
TRIGLYCERIDES: 261 mg/dL — AB (ref 0–149)
VLDL Cholesterol Cal: 52 mg/dL — ABNORMAL HIGH (ref 5–40)

## 2017-10-06 DIAGNOSIS — N186 End stage renal disease: Secondary | ICD-10-CM | POA: Diagnosis not present

## 2017-10-06 DIAGNOSIS — N2581 Secondary hyperparathyroidism of renal origin: Secondary | ICD-10-CM | POA: Diagnosis not present

## 2017-10-07 DIAGNOSIS — N186 End stage renal disease: Secondary | ICD-10-CM | POA: Diagnosis not present

## 2017-10-07 DIAGNOSIS — N2581 Secondary hyperparathyroidism of renal origin: Secondary | ICD-10-CM | POA: Diagnosis not present

## 2017-10-08 DIAGNOSIS — N186 End stage renal disease: Secondary | ICD-10-CM | POA: Diagnosis not present

## 2017-10-08 DIAGNOSIS — N2581 Secondary hyperparathyroidism of renal origin: Secondary | ICD-10-CM | POA: Diagnosis not present

## 2017-10-09 DIAGNOSIS — N186 End stage renal disease: Secondary | ICD-10-CM | POA: Diagnosis not present

## 2017-10-09 DIAGNOSIS — N2581 Secondary hyperparathyroidism of renal origin: Secondary | ICD-10-CM | POA: Diagnosis not present

## 2017-10-10 DIAGNOSIS — N2581 Secondary hyperparathyroidism of renal origin: Secondary | ICD-10-CM | POA: Diagnosis not present

## 2017-10-10 DIAGNOSIS — N186 End stage renal disease: Secondary | ICD-10-CM | POA: Diagnosis not present

## 2017-10-11 DIAGNOSIS — N2581 Secondary hyperparathyroidism of renal origin: Secondary | ICD-10-CM | POA: Diagnosis not present

## 2017-10-11 DIAGNOSIS — N186 End stage renal disease: Secondary | ICD-10-CM | POA: Diagnosis not present

## 2017-10-12 DIAGNOSIS — N186 End stage renal disease: Secondary | ICD-10-CM | POA: Diagnosis not present

## 2017-10-12 DIAGNOSIS — B182 Chronic viral hepatitis C: Secondary | ICD-10-CM | POA: Diagnosis not present

## 2017-10-12 DIAGNOSIS — N2581 Secondary hyperparathyroidism of renal origin: Secondary | ICD-10-CM | POA: Diagnosis not present

## 2017-10-13 ENCOUNTER — Telehealth: Payer: Self-pay | Admitting: Pharmacist

## 2017-10-13 DIAGNOSIS — N186 End stage renal disease: Secondary | ICD-10-CM | POA: Diagnosis not present

## 2017-10-13 DIAGNOSIS — N2581 Secondary hyperparathyroidism of renal origin: Secondary | ICD-10-CM | POA: Diagnosis not present

## 2017-10-13 NOTE — Telephone Encounter (Signed)
LMOM to discuss plan.

## 2017-10-13 NOTE — Telephone Encounter (Signed)
-----   Message from Evans Lance, MD sent at 10/09/2017  8:25 PM EDT ----- Sounds good. Thanks Georgina Peer ----- Message ----- From: Erskine Emery, [facility] Sent: 10/07/2017   2:19 PM EDT To: Evans Lance, MD, Damian Leavell, RN  LDL is improved on higher dose of rosuvastatin, but not at goal <70. Even with kidney function improvement, Crcl remains less than 44ml/min, thus would not recommend increasing rosuvastatin. Would recommend addition of ezetimibe 5mg  daily. Recheck cholesterol panel in 2-3 months.

## 2017-10-14 ENCOUNTER — Other Ambulatory Visit: Payer: Self-pay

## 2017-10-14 DIAGNOSIS — E782 Mixed hyperlipidemia: Secondary | ICD-10-CM

## 2017-10-14 DIAGNOSIS — N186 End stage renal disease: Secondary | ICD-10-CM | POA: Diagnosis not present

## 2017-10-14 DIAGNOSIS — N2581 Secondary hyperparathyroidism of renal origin: Secondary | ICD-10-CM | POA: Diagnosis not present

## 2017-10-14 MED ORDER — EZETIMIBE 10 MG PO TABS: 5.0000 mg | ORAL_TABLET | Freq: Every day | ORAL | 3 refills | Status: DC

## 2017-10-14 NOTE — Progress Notes (Signed)
ze

## 2017-10-15 DIAGNOSIS — N186 End stage renal disease: Secondary | ICD-10-CM | POA: Diagnosis not present

## 2017-10-15 DIAGNOSIS — N2581 Secondary hyperparathyroidism of renal origin: Secondary | ICD-10-CM | POA: Diagnosis not present

## 2017-10-16 DIAGNOSIS — N2581 Secondary hyperparathyroidism of renal origin: Secondary | ICD-10-CM | POA: Diagnosis not present

## 2017-10-16 DIAGNOSIS — N186 End stage renal disease: Secondary | ICD-10-CM | POA: Diagnosis not present

## 2017-10-17 ENCOUNTER — Other Ambulatory Visit: Payer: Self-pay | Admitting: *Deleted

## 2017-10-17 DIAGNOSIS — N186 End stage renal disease: Secondary | ICD-10-CM | POA: Diagnosis not present

## 2017-10-17 DIAGNOSIS — N2581 Secondary hyperparathyroidism of renal origin: Secondary | ICD-10-CM | POA: Diagnosis not present

## 2017-10-17 NOTE — Telephone Encounter (Signed)
LMOM to follow up

## 2017-10-17 NOTE — Patient Outreach (Signed)
[facility]) Care Management  10/17/2017   Kristen Bradley [birth year] [id number]   RN Health Coach telephone call to patient.  [place] compliance verified. Per patient she is weighing daily. Patient stated she has started peritoneal dialysis. Patient understands congestive heart failure action plan and zones.  Patient is monitoring sodium in diet. Patient voiced receiving scale and educational material. Per patient she is having some pain in foot and was told she had some gout. Per patient she was placed on steroid. Patient has agreed to follow up outreach call.   Current Medications:  Current Outpatient Medications  Medication Sig Dispense Refill  . acetaminophen (TYLENOL) 500 MG tablet Take 1,000 mg by mouth every 6 (six) hours as needed for mild pain or headache.     . carvedilol (COREG) 6.25 MG tablet Take 1 tablet (6.25 mg total) by mouth 2 (two) times daily with a meal. 60 tablet 3  . clopidogrel (PLAVIX) 75 MG tablet Take 1 tablet (75 mg total) by mouth daily. 30 tablet 3  . ezetimibe (ZETIA) 10 MG tablet Take 0.5 tablets (5 mg total) by mouth daily. 45 tablet 3  . furosemide (LASIX) 80 MG tablet TAKE 1 TABLET BY MOUTH TWICE DAILY 180 tablet 3  . glipiZIDE (GLUCOTROL) 5 MG tablet Take 2.5 mg by mouth daily before breakfast.     . hydrALAZINE (APRESOLINE) 25 MG tablet Take 1 tablet (25 mg total) by mouth 3 (three) times daily. 90 tablet 3  . IRON PO Take 1 tablet by mouth daily.    . isosorbide mononitrate (IMDUR) 60 MG 24 hr tablet Take 1 tablet (60 mg total) by mouth daily. 30 tablet 3  . nitroGLYCERIN (NITROSTAT) 0.4 MG SL tablet Place 1 tablet (0.4 mg total) under the tongue every 5 (five) minutes as needed for chest pain. 25 tablet 3  . polyethylene glycol (MIRALAX / GLYCOLAX) packet Take 17 g by mouth daily as needed for mild constipation or moderate constipation. 30 each 3  . rosuvastatin (CRESTOR) 20 MG tablet Take 1 tablet (20 mg total) by mouth daily. 30 tablet  11   No current facility-administered medications for this visit.     Functional Status:  In your present state of health, do you have any difficulty performing the following activities: 07/28/2017 07/09/2017  Hearing? N N  Vision? N N  Difficulty concentrating or making decisions? N N  Walking or climbing stairs? Y Y  Dressing or bathing? N N  Doing errands, shopping? N N  Preparing Food and eating ? N -  Using the Toilet? N -  In the past six months, have you accidently leaked urine? N -  Do you have problems with loss of bowel control? N -  Managing your Medications? N -  Managing your Finances? N -  Housekeeping or managing your Housekeeping? N -  Some recent data might be hidden    Fall/Depression Screening: Fall Risk  10/17/2017 07/28/2017 04/23/2016  Falls in the past year? No No No   PHQ 2/9 Scores 10/17/2017 07/28/2017 04/23/2016 04/08/2016  PHQ - 2 Score 0 0 0 0   THN CM Care Plan Problem One     Most Recent Value  Care Plan Problem One  Knowledge Deficit in Self Management of Congestive Heart Failure  Role Documenting the Problem One  [place] for Problem One  Active  THN Long Term Goal   Patient will not have any admissions for congestive heart failure within the  next 90 days  Interventions for Problem One Long Term Goal  RN reiterated the zones and action plan of CHF. Patient has also started peritoneal dialysis. RN will follow up with reiterating zones and action plan each outreach  [facility] CM Short Term Goal #1 Met Date  10/17/17  [facility] CM Short Term Goal #2   Patient will have a better understanding of low sodium diet withn the next 30 days  Interventions for Short Term Goal #2  RN reiterated low soodium diet and food preparation. Per patient she mostly boils her food and does not eat a lot of  meat. RN discussed frozen vegetables.   THN CM Short Term Goal #3  Patient will verbalize receiving educational material on gout withn the next 30 days  THN CM Short Term  Goal #3 Start Date  10/17/17  Interventions for Short Tern Goal #3  RN discussed patient gout symptoms and treatment. RN discussed diet. RN sent education material        Assessment:  Patient has started home peritioneal dialysis Patient has received scale for Health Coach Patient verbalize weighing daily Patient verbalized understanding zones and action plan of CHF Patient stated having foot and ankle pain (per pt dr stated gout)   Plan:  RN reiterated zones and action plan for CHF RN sent EMMI educational material on Heart failure working with your Dr RN sent EMMI educational material on When to call 911 RN sent EMMI educational material on  Gout RN sent EMMI educational material on  Low purine diet Lifestyle changes to manage gout RN will follow up within the month of November  [name] BSN RN [place] Management [phone]

## 2017-10-18 ENCOUNTER — Ambulatory Visit
Admission: RE | Admit: 2017-10-18 | Discharge: 2017-10-18 | Disposition: A | Discharge status: Home / Self Care | Payer: Medicare HMO | Source: Ambulatory Visit | Attending: Family Medicine | Admitting: Family Medicine

## 2017-10-18 DIAGNOSIS — N2581 Secondary hyperparathyroidism of renal origin: Secondary | ICD-10-CM | POA: Diagnosis not present

## 2017-10-18 DIAGNOSIS — Z1231 Encounter for screening mammogram for malignant neoplasm of breast: Secondary | ICD-10-CM

## 2017-10-18 DIAGNOSIS — N186 End stage renal disease: Secondary | ICD-10-CM | POA: Diagnosis not present

## 2017-10-19 ENCOUNTER — Other Ambulatory Visit: Payer: Self-pay | Admitting: Nephrology

## 2017-10-19 ENCOUNTER — Ambulatory Visit
Admission: RE | Admit: 2017-10-19 | Discharge: 2017-10-19 | Disposition: A | Discharge status: Home / Self Care | Payer: Medicare HMO | Source: Ambulatory Visit | Attending: Nephrology | Admitting: Nephrology

## 2017-10-19 DIAGNOSIS — J90 Pleural effusion, not elsewhere classified: Secondary | ICD-10-CM | POA: Diagnosis not present

## 2017-10-19 DIAGNOSIS — N186 End stage renal disease: Secondary | ICD-10-CM | POA: Diagnosis not present

## 2017-10-19 DIAGNOSIS — N2581 Secondary hyperparathyroidism of renal origin: Secondary | ICD-10-CM | POA: Diagnosis not present

## 2017-10-19 DIAGNOSIS — R0602 Shortness of breath: Secondary | ICD-10-CM

## 2017-10-19 NOTE — Telephone Encounter (Signed)
Third attempt to reach patient LMOM. Will close encounter and await call back. RX has already been sent to pharmacy.

## 2017-10-20 DIAGNOSIS — N2581 Secondary hyperparathyroidism of renal origin: Secondary | ICD-10-CM | POA: Diagnosis not present

## 2017-10-20 DIAGNOSIS — N186 End stage renal disease: Secondary | ICD-10-CM | POA: Diagnosis not present

## 2017-10-21 DIAGNOSIS — N186 End stage renal disease: Secondary | ICD-10-CM | POA: Diagnosis not present

## 2017-10-21 DIAGNOSIS — N2581 Secondary hyperparathyroidism of renal origin: Secondary | ICD-10-CM | POA: Diagnosis not present

## 2017-10-22 DIAGNOSIS — N2581 Secondary hyperparathyroidism of renal origin: Secondary | ICD-10-CM | POA: Diagnosis not present

## 2017-10-22 DIAGNOSIS — N186 End stage renal disease: Secondary | ICD-10-CM | POA: Diagnosis not present

## 2017-10-23 DIAGNOSIS — N186 End stage renal disease: Secondary | ICD-10-CM | POA: Diagnosis not present

## 2017-10-23 DIAGNOSIS — N2581 Secondary hyperparathyroidism of renal origin: Secondary | ICD-10-CM | POA: Diagnosis not present

## 2017-10-24 DIAGNOSIS — N186 End stage renal disease: Secondary | ICD-10-CM | POA: Diagnosis not present

## 2017-10-24 DIAGNOSIS — N2581 Secondary hyperparathyroidism of renal origin: Secondary | ICD-10-CM | POA: Diagnosis not present

## 2017-10-25 DIAGNOSIS — N186 End stage renal disease: Secondary | ICD-10-CM | POA: Diagnosis not present

## 2017-10-25 DIAGNOSIS — N2581 Secondary hyperparathyroidism of renal origin: Secondary | ICD-10-CM | POA: Diagnosis not present

## 2017-10-26 DIAGNOSIS — N2581 Secondary hyperparathyroidism of renal origin: Secondary | ICD-10-CM | POA: Diagnosis not present

## 2017-10-26 DIAGNOSIS — N186 End stage renal disease: Secondary | ICD-10-CM | POA: Diagnosis not present

## 2017-10-27 DIAGNOSIS — N2581 Secondary hyperparathyroidism of renal origin: Secondary | ICD-10-CM | POA: Diagnosis not present

## 2017-10-27 DIAGNOSIS — N186 End stage renal disease: Secondary | ICD-10-CM | POA: Diagnosis not present

## 2017-10-28 ENCOUNTER — Other Ambulatory Visit: Payer: Self-pay | Admitting: Nephrology

## 2017-10-28 ENCOUNTER — Ambulatory Visit
Admission: RE | Admit: 2017-10-28 | Discharge: 2017-10-28 | Disposition: A | Discharge status: Home / Self Care | Payer: Medicare HMO | Source: Ambulatory Visit | Attending: Nephrology | Admitting: Nephrology

## 2017-10-28 DIAGNOSIS — R109 Unspecified abdominal pain: Secondary | ICD-10-CM

## 2017-10-28 DIAGNOSIS — N186 End stage renal disease: Secondary | ICD-10-CM | POA: Diagnosis not present

## 2017-10-28 DIAGNOSIS — R143 Flatulence: Secondary | ICD-10-CM | POA: Diagnosis not present

## 2017-10-28 DIAGNOSIS — N2581 Secondary hyperparathyroidism of renal origin: Secondary | ICD-10-CM | POA: Diagnosis not present

## 2017-10-29 DIAGNOSIS — N186 End stage renal disease: Secondary | ICD-10-CM | POA: Diagnosis not present

## 2017-10-29 DIAGNOSIS — N2581 Secondary hyperparathyroidism of renal origin: Secondary | ICD-10-CM | POA: Diagnosis not present

## 2017-10-30 DIAGNOSIS — N2581 Secondary hyperparathyroidism of renal origin: Secondary | ICD-10-CM | POA: Diagnosis not present

## 2017-10-30 DIAGNOSIS — N186 End stage renal disease: Secondary | ICD-10-CM | POA: Diagnosis not present

## 2017-10-31 DIAGNOSIS — N2581 Secondary hyperparathyroidism of renal origin: Secondary | ICD-10-CM | POA: Diagnosis not present

## 2017-10-31 DIAGNOSIS — E1122 Type 2 diabetes mellitus with diabetic chronic kidney disease: Secondary | ICD-10-CM | POA: Diagnosis not present

## 2017-10-31 DIAGNOSIS — N186 End stage renal disease: Secondary | ICD-10-CM | POA: Diagnosis not present

## 2017-10-31 DIAGNOSIS — Z992 Dependence on renal dialysis: Secondary | ICD-10-CM | POA: Diagnosis not present

## 2017-11-01 DIAGNOSIS — N186 End stage renal disease: Secondary | ICD-10-CM | POA: Diagnosis not present

## 2017-11-01 DIAGNOSIS — N2581 Secondary hyperparathyroidism of renal origin: Secondary | ICD-10-CM | POA: Diagnosis not present

## 2017-11-02 DIAGNOSIS — N186 End stage renal disease: Secondary | ICD-10-CM | POA: Diagnosis not present

## 2017-11-02 DIAGNOSIS — N2581 Secondary hyperparathyroidism of renal origin: Secondary | ICD-10-CM | POA: Diagnosis not present

## 2017-11-03 DIAGNOSIS — N2581 Secondary hyperparathyroidism of renal origin: Secondary | ICD-10-CM | POA: Diagnosis not present

## 2017-11-03 DIAGNOSIS — N186 End stage renal disease: Secondary | ICD-10-CM | POA: Diagnosis not present

## 2017-11-04 DIAGNOSIS — N2581 Secondary hyperparathyroidism of renal origin: Secondary | ICD-10-CM | POA: Diagnosis not present

## 2017-11-04 DIAGNOSIS — N186 End stage renal disease: Secondary | ICD-10-CM | POA: Diagnosis not present

## 2017-11-05 DIAGNOSIS — N2581 Secondary hyperparathyroidism of renal origin: Secondary | ICD-10-CM | POA: Diagnosis not present

## 2017-11-05 DIAGNOSIS — N186 End stage renal disease: Secondary | ICD-10-CM | POA: Diagnosis not present

## 2017-11-06 DIAGNOSIS — N186 End stage renal disease: Secondary | ICD-10-CM | POA: Diagnosis not present

## 2017-11-06 DIAGNOSIS — N2581 Secondary hyperparathyroidism of renal origin: Secondary | ICD-10-CM | POA: Diagnosis not present

## 2017-11-07 DIAGNOSIS — T85611A Breakdown (mechanical) of intraperitoneal dialysis catheter, initial encounter: Secondary | ICD-10-CM | POA: Diagnosis not present

## 2017-11-07 DIAGNOSIS — E1121 Type 2 diabetes mellitus with diabetic nephropathy: Secondary | ICD-10-CM | POA: Diagnosis not present

## 2017-11-07 DIAGNOSIS — N186 End stage renal disease: Secondary | ICD-10-CM | POA: Diagnosis not present

## 2017-11-07 DIAGNOSIS — N2581 Secondary hyperparathyroidism of renal origin: Secondary | ICD-10-CM | POA: Diagnosis not present

## 2017-11-08 DIAGNOSIS — N2581 Secondary hyperparathyroidism of renal origin: Secondary | ICD-10-CM | POA: Diagnosis not present

## 2017-11-08 DIAGNOSIS — N186 End stage renal disease: Secondary | ICD-10-CM | POA: Diagnosis not present

## 2017-11-09 DIAGNOSIS — N186 End stage renal disease: Secondary | ICD-10-CM | POA: Diagnosis not present

## 2017-11-09 DIAGNOSIS — N2581 Secondary hyperparathyroidism of renal origin: Secondary | ICD-10-CM | POA: Diagnosis not present

## 2017-11-10 DIAGNOSIS — N186 End stage renal disease: Secondary | ICD-10-CM | POA: Diagnosis not present

## 2017-11-10 DIAGNOSIS — N2581 Secondary hyperparathyroidism of renal origin: Secondary | ICD-10-CM | POA: Diagnosis not present

## 2017-11-11 DIAGNOSIS — N2581 Secondary hyperparathyroidism of renal origin: Secondary | ICD-10-CM | POA: Diagnosis not present

## 2017-11-11 DIAGNOSIS — N186 End stage renal disease: Secondary | ICD-10-CM | POA: Diagnosis not present

## 2017-11-12 DIAGNOSIS — N2581 Secondary hyperparathyroidism of renal origin: Secondary | ICD-10-CM | POA: Diagnosis not present

## 2017-11-12 DIAGNOSIS — N186 End stage renal disease: Secondary | ICD-10-CM | POA: Diagnosis not present

## 2017-11-13 DIAGNOSIS — N2581 Secondary hyperparathyroidism of renal origin: Secondary | ICD-10-CM | POA: Diagnosis not present

## 2017-11-13 DIAGNOSIS — N186 End stage renal disease: Secondary | ICD-10-CM | POA: Diagnosis not present

## 2017-11-14 DIAGNOSIS — N2581 Secondary hyperparathyroidism of renal origin: Secondary | ICD-10-CM | POA: Diagnosis not present

## 2017-11-14 DIAGNOSIS — N186 End stage renal disease: Secondary | ICD-10-CM | POA: Diagnosis not present

## 2017-11-15 ENCOUNTER — Ambulatory Visit (INDEPENDENT_AMBULATORY_CARE_PROVIDER_SITE_OTHER): Payer: Medicare HMO | Admitting: *Deleted

## 2017-11-15 DIAGNOSIS — N2581 Secondary hyperparathyroidism of renal origin: Secondary | ICD-10-CM | POA: Diagnosis not present

## 2017-11-15 DIAGNOSIS — I255 Ischemic cardiomyopathy: Secondary | ICD-10-CM

## 2017-11-15 DIAGNOSIS — N186 End stage renal disease: Secondary | ICD-10-CM | POA: Diagnosis not present

## 2017-11-16 ENCOUNTER — Ambulatory Visit: Payer: Medicare HMO | Admitting: Cardiovascular Disease

## 2017-11-16 ENCOUNTER — Telehealth (HOSPITAL_COMMUNITY): Payer: Self-pay | Admitting: *Deleted

## 2017-11-16 DIAGNOSIS — N2581 Secondary hyperparathyroidism of renal origin: Secondary | ICD-10-CM | POA: Diagnosis not present

## 2017-11-16 DIAGNOSIS — N186 End stage renal disease: Secondary | ICD-10-CM | POA: Diagnosis not present

## 2017-11-16 NOTE — Progress Notes (Signed)
Remote ICD transmission.   

## 2017-11-16 NOTE — Telephone Encounter (Signed)
Asked patient to call me to schedule appointment.

## 2017-11-17 ENCOUNTER — Other Ambulatory Visit: Payer: Self-pay

## 2017-11-17 DIAGNOSIS — N2581 Secondary hyperparathyroidism of renal origin: Secondary | ICD-10-CM | POA: Diagnosis not present

## 2017-11-17 DIAGNOSIS — N186 End stage renal disease: Secondary | ICD-10-CM | POA: Diagnosis not present

## 2017-11-17 DIAGNOSIS — I70213 Atherosclerosis of native arteries of extremities with intermittent claudication, bilateral legs: Secondary | ICD-10-CM

## 2017-11-18 ENCOUNTER — Other Ambulatory Visit: Payer: Self-pay | Admitting: Vascular Surgery

## 2017-11-18 ENCOUNTER — Ambulatory Visit (INDEPENDENT_AMBULATORY_CARE_PROVIDER_SITE_OTHER): Payer: Medicare HMO | Admitting: Family

## 2017-11-18 ENCOUNTER — Encounter: Payer: Self-pay | Admitting: Vascular Surgery

## 2017-11-18 ENCOUNTER — Other Ambulatory Visit: Payer: Self-pay

## 2017-11-18 ENCOUNTER — Ambulatory Visit (HOSPITAL_COMMUNITY)
Admission: RE | Admit: 2017-11-18 | Discharge: 2017-11-18 | Disposition: A | Discharge status: Home / Self Care | Payer: Medicare HMO | Source: Ambulatory Visit | Attending: Family | Admitting: Family

## 2017-11-18 ENCOUNTER — Encounter: Payer: Self-pay | Admitting: Family

## 2017-11-18 VITALS — BP 123/63 | HR 89 | Temp 98.2°F | Resp 14 | Ht 66.0 in | Wt 147.0 lb

## 2017-11-18 DIAGNOSIS — N2581 Secondary hyperparathyroidism of renal origin: Secondary | ICD-10-CM | POA: Diagnosis not present

## 2017-11-18 DIAGNOSIS — E1151 Type 2 diabetes mellitus with diabetic peripheral angiopathy without gangrene: Secondary | ICD-10-CM

## 2017-11-18 DIAGNOSIS — Z87891 Personal history of nicotine dependence: Secondary | ICD-10-CM | POA: Diagnosis not present

## 2017-11-18 DIAGNOSIS — N186 End stage renal disease: Secondary | ICD-10-CM | POA: Diagnosis not present

## 2017-11-18 DIAGNOSIS — Z9889 Other specified postprocedural states: Secondary | ICD-10-CM

## 2017-11-18 DIAGNOSIS — I779 Disorder of arteries and arterioles, unspecified: Secondary | ICD-10-CM

## 2017-11-18 DIAGNOSIS — I70213 Atherosclerosis of native arteries of extremities with intermittent claudication, bilateral legs: Secondary | ICD-10-CM | POA: Insufficient documentation

## 2017-11-18 DIAGNOSIS — I6523 Occlusion and stenosis of bilateral carotid arteries: Secondary | ICD-10-CM

## 2017-11-18 NOTE — Patient Instructions (Signed)
Stroke Prevention Some health problems and behaviors may make it more likely for you to have a stroke. Below are ways to lessen your risk of having a stroke.  Be active for at least 30 minutes on most or all days.  Do not smoke. Try not to be around others who smoke.  Do not drink too much alcohol. ? Do not have more than 2 drinks a day if you are a man. ? Do not have more than 1 drink a day if you are a woman and are not pregnant.  Eat healthy foods, such as fruits and vegetables. If you were put on a specific diet, follow the diet as told.  Keep your cholesterol levels under control through diet and medicines. Look for foods that are low in saturated fat, trans fat, cholesterol, and are high in fiber.  If you have diabetes, follow all diet plans and take your medicine as told.  Ask your doctor if you need treatment to lower your blood pressure. If you have high blood pressure (hypertension), follow all diet plans and take your medicine as told by your doctor.  If you are 18-39 years old, have your blood pressure checked every 3-5 years. If you are age 40 or older, have your blood pressure checked every year.  Keep a healthy weight. Eat foods that are low in calories, salt, saturated fat, trans fat, and cholesterol.  Do not take drugs.  Avoid birth control pills, if this applies. Talk to your doctor about the risks of taking birth control pills.  Talk to your doctor if you have sleep problems (sleep apnea).  Take all medicine as told by your doctor. ? You may be told to take aspirin or blood thinner medicine. Take this medicine as told by your doctor. ? Understand your medicine instructions.  Make sure any other conditions you have are being taken care of.  Get help right away if:  You suddenly lose feeling (you feel numb) or have weakness in your face, arm, or leg.  Your face or eyelid hangs down to one side.  You suddenly feel confused.  You have trouble talking  (aphasia) or understanding what people are saying.  You suddenly have trouble seeing in one or both eyes.  You suddenly have trouble walking.  You are dizzy.  You lose your balance or your movements are clumsy (uncoordinated).  You suddenly have a very bad headache and you do not know the cause.  You have new chest pain.  Your heart feels like it is fluttering or skipping a beat (irregular heartbeat). Do not wait to see if the symptoms above go away. Get help right away. Call your local emergency services (911 in [place] Do not drive yourself to the hospital. This information is not intended to replace advice given to you by your health care provider. Make sure you discuss any questions you have with your health care provider. Document Released: 07/20/2011 Document Revised: 06/26/2015 Document Reviewed: 07/21/2012 Elsevier Interactive Patient Education  2018 Elsevier Inc.     Peripheral Vascular Disease Peripheral vascular disease (PVD) is a disease of the blood vessels that are not part of your heart and brain. A simple term for PVD is poor circulation. In most cases, PVD narrows the blood vessels that carry blood from your heart to the rest of your body. This can result in a decreased supply of blood to your arms, legs, and internal organs, like your stomach or kidneys. However, it most often affects   a person's lower legs and feet. There are two types of PVD.  Organic PVD. This is the more common type. It is caused by damage to the structure of blood vessels.  Functional PVD. This is caused by conditions that make blood vessels contract and tighten (spasm).  Without treatment, PVD tends to get worse over time. PVD can also lead to acute ischemic limb. This is when an arm or limb suddenly has trouble getting enough blood. This is a medical emergency. Follow these instructions at home:  Take medicines only as told by your doctor.  Do not use any tobacco products, including  cigarettes, chewing tobacco, or electronic cigarettes. If you need help quitting, ask your doctor.  Lose weight if you are overweight, and maintain a healthy weight as told by your doctor.  Eat a diet that is low in fat and cholesterol. If you need help, ask your doctor.  Exercise regularly. Ask your doctor for some good activities for you.  Take good care of your feet. ? Wear comfortable shoes that fit well. ? Check your feet often for any cuts or sores. Contact a doctor if:  You have cramps in your legs while walking.  You have leg pain when you are at rest.  You have coldness in a leg or foot.  Your skin changes.  You are unable to get or have an erection (erectile dysfunction).  You have cuts or sores on your feet that are not healing. Get help right away if:  Your arm or leg turns cold and blue.  Your arms or legs become red, warm, swollen, painful, or numb.  You have chest pain or trouble breathing.  You suddenly have weakness in your face, arm, or leg.  You become very confused or you cannot speak.  You suddenly have a very bad headache.  You suddenly cannot see. This information is not intended to replace advice given to you by your health care provider. Make sure you discuss any questions you have with your health care provider. Document Released: 04/14/2009 Document Revised: 06/26/2015 Document Reviewed: 06/28/2013 Elsevier Interactive Patient Education  2017 Elsevier Inc.  

## 2017-11-18 NOTE — Progress Notes (Signed)
Chief Complaint: sudden onset left foot pain, x 2 weeks, with hx of PAOD and Extracranial Carotid Artery Stenosis   History of Present Illness  Kristen Bradley is a 72 y.o. female who is s/p right CEA on 01/05/2012, and left CEA using patch angioplasty Vascu-Guard bovine patch 1cm x 6cmon 02/08/2012  by Dr. [name].  She also has PAD.  She returns today at the request of her nephrologist to evaluate pain in left foot that started about 2 weeks ago, that starts immediately with walking, came on suddenly, not wprsening.  The pain is located in the medial and lateral base of her 1st and 5th metatarsal.    The patient has no hx of amaurosis fugax or monocular blindness, hemiplegia, or receptive or expressive aphasia. The patient previously had some L corner of mouth droop after her R CEA, which has since resolved.   At her visit with Dr. [name] on 01/26/2013 his assessment was the following:  Her sx pattern for her Left leg and ABI are not consistent with L PAD as the etiology for her sx. I would consider spinal evaluation with +/- EMG/NCV studies. The patient needs to stop smoking otherwise her carotid arteries are likely to restenose and her PAD will progress with time. The pt's prior left corner of mouth drooping is NOT consistent with marginal mandibular drooping as it was CONTRALATERAL to the R CEA, suggesting possible small contralateral CVA.   Pt denies any subsequent stroke or TIA symptoms.  She denies steal symptoms in either upper extremity, denies dizziness.  She started peritoneal dialysis in July 2019; she does not like this, does not want HD, states if this is not working, then she "will give it up".   She had an ICD placement in October 2018.   Diabetic: Yes, states she does not recall her last A1C, not on file Tobacco use: smoker until February 2018(1/3 ppd, started smoking at about age 50), states she stopped several times  Pt meds include:  Statin :no Betablocker:  Yes ASA: Yes Other anticoagulants/antiplatelets: no   Past Medical History:  Diagnosis Date  . 3-vessel CAD - treated medically  02/26/2016  . Acute renal failure superimposed on stage 3 chronic kidney disease ([place])   . Adrenal tumor 08/2007   surgery  . Aftercare following surgery of the circulatory system, [place] 05/26/2012  . AKI (acute kidney injury) ([place])   . Atherosclerosis of native artery of extremity with intermittent claudication ([place]) 01/26/2013  . CAD, multiple vessel   . Cancer of right breast [facility])    s/p lumpectomy, XRT  . Cardiomyopathy, ischemic   . Carotid artery disease (HCC)    s/p bilateral CEA  . Carotid stenosis, bilateral-s/p Bilateral CEA 11/05/2011  . Chronic ITP (idiopathic thrombocytopenia) (HCC) 02/24/2016  . Chronic systolic (congestive) heart failure ([place]) 11/05/2016  . Chronic systolic heart failure ([place]) 03/03/2016  . CKD (chronic kidney disease) stage 4, GFR 15-29 ml/min (HCC)   . Dyspnea   . Elevated brain natriuretic peptide (BNP) level 01/30/2016  . Elevated troponin I level 01/30/2016  . Hepatitis C    "got treatment for it" (01/29/2016)  . HFrEF (heart failure with reduced ejection fraction) ([place])   . High cholesterol    takes Lopid daily  . History of UTI    takes Diflucan dail--pt states no uti in a couple of yrs though  . Hypertension    takes Losartan,Amlodipine,HCTZ,and Atenolol daily and Clatarpres  . Intermittent claudication ([place]) 11/05/2011  .  Ischemic cardiomyopathy    a. 11/2016 s/p MDT BiV ICD w/ his bundle pacing.  . Joint pain    legs  . LBBB- new 01/30/2016  . NSTEMI (non-ST elevated myocardial infarction) ([place])   . Occlusion and stenosis of carotid artery without mention of cerebral infarction 12/17/2011  . PAD (peripheral artery disease) ([place]) 11/26/2011  . Pain in limb 09/08/2012  . Peripheral vascular disease ([place]) 11/05/2011  . Renal artery stenosis (HCC)    Right renal artery stent 05/31/06  . Renal failure syndrome    . Sinus bradycardia   . Sinus pause   . Stroke [facility]) Jan. 7, 2014  . Thrombocytopenia- plts 81K 01/30/2016  . Tobacco abuse   . Type 2 diabetes mellitus with vascular disease ([place])   . Type II diabetes mellitus ([place])     Social History Social History   Tobacco Use  . Smoking status: Former Smoker    Packs/day: 0.50    Years: 53.00    Pack years: 26.50    Types: Cigarettes    Last attempt to quit: 03/2016    Years since quitting: 1.7  . Smokeless tobacco: Never Used  Substance Use Topics  . Alcohol use: No    Alcohol/week: 0.0 standard drinks  . Drug use: No    Family History Family History  Problem Relation Age of Onset  . Cancer Mother        ? type  . Cancer Father        ? type  . Diabetes Sister   . Hypertension Sister   . Diabetes Brother   . Hypertension Brother   . CAD Neg Hx     Surgical History Past Surgical History:  Procedure Laterality Date  . ABDOMINAL HYSTERECTOMY    . ADRENAL GLAND SURGERY  2009  . BIV ICD INSERTION CRT-D N/A 11/05/2016   Procedure: BIV ICD INSERTION CRT-D;  Surgeon: Evans Lance, MD;  Location: [facility];  Service: Cardiovascular;  Laterality: N/A;  . BREAST LUMPECTOMY Right 2009  . CARDIAC CATHETERIZATION N/A 02/03/2016   Procedure: Right/Left Heart Cath and Coronary Angiography;  Surgeon: Burnell Blanks, MD;  Location: [facility];  Service: Cardiovascular;  Laterality: N/A;  . ENDARTERECTOMY  01/05/2012   Procedure: ENDARTERECTOMY CAROTID;  Surgeon: Conrad [place], MD;  Location: [facility];  Service: Vascular;  Laterality: Right;  Right Carotid Artery Endarterectomy, right stump pressure, intraoperative ultrasound  . ENDARTERECTOMY  02/08/2012   Procedure: ENDARTERECTOMY CAROTID;  Surgeon: Conrad [place], MD;  Location: [facility];  Service: Vascular;  Laterality: Left;  . LUMBAR DISC SURGERY    . PATCH ANGIOPLASTY  02/08/2012   Procedure: PATCH ANGIOPLASTY;  Surgeon: Conrad [place], MD;  Location[facility];  Service:  Vascular;  Laterality: Left;  Carotid artery patch angioplasty using Vascu-Guard bovine patch 1cm x 6cm.  . RENAL ARTERY STENT Right 05/2006    Allergies  Allergen Reactions  . Chlorhexidine Gluconate Itching  . Ace Inhibitors Cough  . Dilaudid [Hydromorphone Hcl] Itching    Current Outpatient Medications  Medication Sig Dispense Refill  . acetaminophen (TYLENOL) 500 MG tablet Take 1,000 mg by mouth every 6 (six) hours as needed for mild pain or headache.     . carvedilol (COREG) 6.25 MG tablet Take 1 tablet (6.25 mg total) by mouth 2 (two) times daily with a meal. 60 tablet 3  . ezetimibe (ZETIA) 10 MG tablet Take 0.5 tablets (5 mg total) by mouth daily. [place]  tablet 3  . furosemide (LASIX) 80 MG tablet TAKE 1 TABLET BY MOUTH TWICE DAILY 180 tablet 3  . glipiZIDE (GLUCOTROL) 5 MG tablet Take 2.5 mg by mouth daily before breakfast.     . hydrALAZINE (APRESOLINE) 25 MG tablet Take 1 tablet (25 mg total) by mouth 3 (three) times daily. 90 tablet 3  . IRON PO Take 1 tablet by mouth daily.    . isosorbide mononitrate (IMDUR) 60 MG 24 hr tablet Take 1 tablet (60 mg total) by mouth daily. 30 tablet 3  . polyethylene glycol (MIRALAX / GLYCOLAX) packet Take 17 g by mouth daily as needed for mild constipation or moderate constipation. 30 each 3  . rosuvastatin (CRESTOR) 20 MG tablet Take 1 tablet (20 mg total) by mouth daily. 30 tablet 11  . clopidogrel (PLAVIX) 75 MG tablet Take 1 tablet (75 mg total) by mouth daily. (Patient not taking: Reported on 11/18/2017) 30 tablet 3  . nitroGLYCERIN (NITROSTAT) 0.4 MG SL tablet Place 1 tablet (0.4 mg total) under the tongue every 5 (five) minutes as needed for chest pain. (Patient not taking: Reported on 11/18/2017) 25 tablet 3   No current facility-administered medications for this visit.     Review of Systems : See HPI for pertinent positives and negatives.  Physical Examination  Vitals:   11/18/17 1505  BP: 123/63  Pulse: 89  Resp: 14  Temp: 98.2  F (36.8 C)  TempSrc: Oral  SpO2: 100%  Weight: 147 lb (66.7 kg)  Height: 5\' 6"  (1.676 m)   Body mass index is 23.73 kg/m.  General: WDWN female in NAD GAIT: antalgic Eyes: PERRLA HENT: No gross abnormalities.  Pulmonary:  Respirations are non-labored, fair air movement in all fields, CTAB, no rales, rhonchi, or wheezes. Cardiac: regular rhythm, no detected murmur.  VASCULAR EXAM Carotid Bruits Right Left   Positive Positive     Abdominal aortic pulse is not palpable. Has PD catheter under dressing, soft abdominal distention.  Radial pulses are 1+ palpable and equal.                                                                                                                            LE Pulses Right Left       FEMORAL  1-2+ palpable  not palpable        POPLITEAL  not palpable   not palpable       POSTERIOR TIBIAL  not palpable   not palpable        DORSALIS PEDIS      ANTERIOR TIBIAL not palpable  not palpable     Gastrointestinal: soft, nontender, BS WNL, no r/g, no palpable masses. Abdomen softly distended. PD catheter taped under dressing.  Musculoskeletal: no muscle atrophy/wasting. M/S 4/5 throughout, extremities without ischemic changes except darkened skin at base of left 1st toe, medial aspect.  Skin: No rashes, no ulcers, no cellulitis.  See MS.  Neurologic:  A&O X 3;  appropriate affect, sensation is normal; speech is normal, CN 2-12 intact, pain and light touch intact in extremities, motor exam as listed above. Psychiatric: Normal thought content, mood appropriate to clinical situation.    Assessment: Kristen Bradley is a 72 y.o. female who is s/p right CEA on 01/05/2012, and left CEA using patch angioplasty Vascu-Guard bovine patch 1cm x 6cmon 02/08/2012.  She has not had a TIA or stroke since 2013. She also has PAOD. She started peritoneal dialysis in July 2019.   She is referred for evaluation today by her nephrologist, for sudden onset left foot pain  at medial base of 1st toe and lateral base of 5th toe. The pain is not present at rest, starts immediately with walking, and has limited her walking. There are no ulcers, no signs of ischemia in her left foot other than mild darkening of skin at medial base of left 1st toe. Left femoral pulse is not palpable (was palpable the last time I evaluated pt on 10-29-16), right femoral pulse is palpable.   Dr. Donzetta Matters spoke with and examined pt. See Plan.   Her atherosclerotic risk factors include controlled DM, former smoker x 53 years until February 2018, COPD, and ESRD on peritoneal dialysis.   DATA  ABI (Date: 11/18/2017):  R:   ABI: 0.36 (was 0.58 on 10-29-16),   PT: mono  DP: mono  TBI:  0.36, toe pressure 46, (was 0.44)  L:   ABI: 0.35 (was 0.54),   PT: mono  DP: mono  TBI: unable to obtain due to low amplitude of PPG waveform. (was 0.40) Decline in bilateral ABI and TBI, monophasic waveforms remain.    Carotid Duplex (10/29/16): Right ICA: CEA site with hyperplasia in the proximal segment, 1-39% stenosis.  Left ICA: CEA site with no restenosis.  Bilateral vertebral artery flow is antegrade.  Bilateral subclavian artery waveforms are normal.  New finding of right ICA hyperplasia compared to the last exam on 09-19-15.   PLAN:   Will schedule arteriogram, possible intervention, right femoral approach, for ASAP, next week.    Clemon Chambers, RN, MSN, FNP-C [facility] Office: [phone]  Clinic Physician: Donzetta Matters  11/18/17 7:17 PM

## 2017-11-19 DIAGNOSIS — N186 End stage renal disease: Secondary | ICD-10-CM | POA: Diagnosis not present

## 2017-11-19 DIAGNOSIS — N2581 Secondary hyperparathyroidism of renal origin: Secondary | ICD-10-CM | POA: Diagnosis not present

## 2017-11-20 DIAGNOSIS — N2581 Secondary hyperparathyroidism of renal origin: Secondary | ICD-10-CM | POA: Diagnosis not present

## 2017-11-20 DIAGNOSIS — N186 End stage renal disease: Secondary | ICD-10-CM | POA: Diagnosis not present

## 2017-11-21 ENCOUNTER — Ambulatory Visit (HOSPITAL_COMMUNITY)
Admission: RE | Admit: 2017-11-21 | Discharge: 2017-11-21 | Disposition: A | Discharge status: Home / Self Care | Payer: Medicare HMO | Source: Ambulatory Visit | Attending: Vascular Surgery | Admitting: Vascular Surgery

## 2017-11-21 ENCOUNTER — Encounter (HOSPITAL_COMMUNITY)
Admission: RE | Disposition: A | Discharge status: Home / Self Care | Payer: Self-pay | Source: Ambulatory Visit | Attending: Vascular Surgery

## 2017-11-21 DIAGNOSIS — I132 Hypertensive heart and chronic kidney disease with heart failure and with stage 5 chronic kidney disease, or end stage renal disease: Secondary | ICD-10-CM | POA: Insufficient documentation

## 2017-11-21 DIAGNOSIS — Z90710 Acquired absence of both cervix and uterus: Secondary | ICD-10-CM | POA: Insufficient documentation

## 2017-11-21 DIAGNOSIS — Z8249 Family history of ischemic heart disease and other diseases of the circulatory system: Secondary | ICD-10-CM | POA: Insufficient documentation

## 2017-11-21 DIAGNOSIS — Z87891 Personal history of nicotine dependence: Secondary | ICD-10-CM | POA: Insufficient documentation

## 2017-11-21 DIAGNOSIS — E7800 Pure hypercholesterolemia, unspecified: Secondary | ICD-10-CM | POA: Insufficient documentation

## 2017-11-21 DIAGNOSIS — N186 End stage renal disease: Secondary | ICD-10-CM | POA: Insufficient documentation

## 2017-11-21 DIAGNOSIS — Z992 Dependence on renal dialysis: Secondary | ICD-10-CM | POA: Insufficient documentation

## 2017-11-21 DIAGNOSIS — Z853 Personal history of malignant neoplasm of breast: Secondary | ICD-10-CM | POA: Diagnosis not present

## 2017-11-21 DIAGNOSIS — Z79899 Other long term (current) drug therapy: Secondary | ICD-10-CM | POA: Insufficient documentation

## 2017-11-21 DIAGNOSIS — I70223 Atherosclerosis of native arteries of extremities with rest pain, bilateral legs: Secondary | ICD-10-CM | POA: Insufficient documentation

## 2017-11-21 DIAGNOSIS — Z9889 Other specified postprocedural states: Secondary | ICD-10-CM | POA: Insufficient documentation

## 2017-11-21 DIAGNOSIS — N2581 Secondary hyperparathyroidism of renal origin: Secondary | ICD-10-CM | POA: Diagnosis not present

## 2017-11-21 DIAGNOSIS — E1151 Type 2 diabetes mellitus with diabetic peripheral angiopathy without gangrene: Secondary | ICD-10-CM | POA: Diagnosis not present

## 2017-11-21 DIAGNOSIS — I5022 Chronic systolic (congestive) heart failure: Secondary | ICD-10-CM | POA: Insufficient documentation

## 2017-11-21 DIAGNOSIS — Z833 Family history of diabetes mellitus: Secondary | ICD-10-CM | POA: Insufficient documentation

## 2017-11-21 DIAGNOSIS — I6523 Occlusion and stenosis of bilateral carotid arteries: Secondary | ICD-10-CM | POA: Insufficient documentation

## 2017-11-21 DIAGNOSIS — Z809 Family history of malignant neoplasm, unspecified: Secondary | ICD-10-CM | POA: Insufficient documentation

## 2017-11-21 DIAGNOSIS — Z95820 Peripheral vascular angioplasty status with implants and grafts: Secondary | ICD-10-CM | POA: Diagnosis not present

## 2017-11-21 DIAGNOSIS — Z7984 Long term (current) use of oral hypoglycemic drugs: Secondary | ICD-10-CM | POA: Diagnosis not present

## 2017-11-21 DIAGNOSIS — E1122 Type 2 diabetes mellitus with diabetic chronic kidney disease: Secondary | ICD-10-CM | POA: Diagnosis not present

## 2017-11-21 DIAGNOSIS — I255 Ischemic cardiomyopathy: Secondary | ICD-10-CM | POA: Diagnosis not present

## 2017-11-21 DIAGNOSIS — Z885 Allergy status to narcotic agent status: Secondary | ICD-10-CM | POA: Diagnosis not present

## 2017-11-21 DIAGNOSIS — N185 Chronic kidney disease, stage 5: Secondary | ICD-10-CM | POA: Diagnosis not present

## 2017-11-21 DIAGNOSIS — Z955 Presence of coronary angioplasty implant and graft: Secondary | ICD-10-CM | POA: Diagnosis not present

## 2017-11-21 DIAGNOSIS — N179 Acute kidney failure, unspecified: Secondary | ICD-10-CM | POA: Diagnosis not present

## 2017-11-21 DIAGNOSIS — Z7902 Long term (current) use of antithrombotics/antiplatelets: Secondary | ICD-10-CM | POA: Insufficient documentation

## 2017-11-21 DIAGNOSIS — Z888 Allergy status to other drugs, medicaments and biological substances status: Secondary | ICD-10-CM | POA: Insufficient documentation

## 2017-11-21 DIAGNOSIS — Z87440 Personal history of urinary (tract) infections: Secondary | ICD-10-CM | POA: Diagnosis not present

## 2017-11-21 DIAGNOSIS — Z8673 Personal history of transient ischemic attack (TIA), and cerebral infarction without residual deficits: Secondary | ICD-10-CM | POA: Insufficient documentation

## 2017-11-21 HISTORY — PX: PERIPHERAL VASCULAR INTERVENTION: CATH118257

## 2017-11-21 HISTORY — PX: ABDOMINAL AORTOGRAM W/LOWER EXTREMITY: CATH118223

## 2017-11-21 LAB — POCT I-STAT 4, (NA,K, GLUC, HGB,HCT)
Glucose, Bld: 124 mg/dL — ABNORMAL HIGH (ref 70–99)
HCT: 33 % — ABNORMAL LOW (ref 36.0–46.0)
Hemoglobin: 11.2 g/dL — ABNORMAL LOW (ref 12.0–15.0)
POTASSIUM: 2.6 mmol/L — AB (ref 3.5–5.1)
SODIUM: 143 mmol/L (ref 135–145)

## 2017-11-21 LAB — POCT I-STAT, CHEM 8
BUN: 19 mg/dL (ref 8–23)
CHLORIDE: 103 mmol/L (ref 98–111)
CREATININE: 3.1 mg/dL — AB (ref 0.44–1.00)
Calcium, Ion: 1.12 mmol/L — ABNORMAL LOW (ref 1.15–1.40)
Glucose, Bld: 125 mg/dL — ABNORMAL HIGH (ref 70–99)
HEMATOCRIT: 31 % — AB (ref 36.0–46.0)
Hemoglobin: 10.5 g/dL — ABNORMAL LOW (ref 12.0–15.0)
Potassium: 2.6 mmol/L — CL (ref 3.5–5.1)
Sodium: 137 mmol/L (ref 135–145)
TCO2: 30 mmol/L (ref 22–32)

## 2017-11-21 LAB — GLUCOSE, CAPILLARY
GLUCOSE-CAPILLARY: 109 mg/dL — AB (ref 70–99)
Glucose-Capillary: 85 mg/dL (ref 70–99)

## 2017-11-21 SURGERY — ABDOMINAL AORTOGRAM W/LOWER EXTREMITY
Anesthesia: LOCAL

## 2017-11-21 MED ORDER — SODIUM CHLORIDE 0.9% FLUSH: 3.0000 mL | INTRAVENOUS | Status: DC | PRN

## 2017-11-21 MED ORDER — IODIXANOL 320 MG/ML IV SOLN
INTRAVENOUS | Status: DC | PRN
  Administered 2017-11-21: 210 mL via INTRA_ARTERIAL

## 2017-11-21 MED ORDER — HEPARIN (PORCINE) IN NACL 1000-0.9 UT/500ML-% IV SOLN
INTRAVENOUS | Status: AC
  Filled 2017-11-21: qty 1000

## 2017-11-21 MED ORDER — ONDANSETRON HCL 4 MG/2ML IJ SOLN: 4.0000 mg | Freq: Four times a day (QID) | INTRAMUSCULAR | Status: DC | PRN

## 2017-11-21 MED ORDER — ACETAMINOPHEN 325 MG PO TABS: 650.0000 mg | ORAL_TABLET | ORAL | Status: DC | PRN

## 2017-11-21 MED ORDER — HEPARIN SODIUM (PORCINE) 1000 UNIT/ML IJ SOLN
INTRAMUSCULAR | Status: AC
  Filled 2017-11-21: qty 1

## 2017-11-21 MED ORDER — LABETALOL HCL 5 MG/ML IV SOLN: 10.0000 mg | INTRAVENOUS | Status: DC | PRN

## 2017-11-21 MED ORDER — MIDAZOLAM HCL 2 MG/2ML IJ SOLN
INTRAMUSCULAR | Status: DC | PRN
  Administered 2017-11-21: 1 mg via INTRAVENOUS

## 2017-11-21 MED ORDER — FENTANYL CITRATE (PF) 100 MCG/2ML IJ SOLN
INTRAMUSCULAR | Status: AC
  Filled 2017-11-21: qty 2

## 2017-11-21 MED ORDER — SODIUM CHLORIDE 0.9% FLUSH: 3.0000 mL | Freq: Two times a day (BID) | INTRAVENOUS | Status: DC

## 2017-11-21 MED ORDER — MORPHINE SULFATE (PF) 10 MG/ML IV SOLN: 2.0000 mg | INTRAVENOUS | Status: DC | PRN

## 2017-11-21 MED ORDER — HYDRALAZINE HCL 20 MG/ML IJ SOLN: 5.0000 mg | INTRAMUSCULAR | Status: DC | PRN

## 2017-11-21 MED ORDER — LIDOCAINE HCL (PF) 1 % IJ SOLN
INTRAMUSCULAR | Status: DC | PRN
  Administered 2017-11-21 (×2): 15 mL via INTRADERMAL

## 2017-11-21 MED ORDER — HEPARIN SODIUM (PORCINE) 1000 UNIT/ML IJ SOLN
INTRAMUSCULAR | Status: DC | PRN
  Administered 2017-11-21: 7000 [IU] via INTRAVENOUS

## 2017-11-21 MED ORDER — LIDOCAINE HCL (PF) 1 % IJ SOLN
INTRAMUSCULAR | Status: AC
  Filled 2017-11-21: qty 30

## 2017-11-21 MED ORDER — HEPARIN (PORCINE) IN NACL 1000-0.9 UT/500ML-% IV SOLN
INTRAVENOUS | Status: DC | PRN
  Administered 2017-11-21 (×2): 500 mL

## 2017-11-21 MED ORDER — OXYCODONE HCL 5 MG PO TABS: 5.0000 mg | ORAL_TABLET | ORAL | Status: DC | PRN

## 2017-11-21 MED ORDER — FENTANYL CITRATE (PF) 100 MCG/2ML IJ SOLN
INTRAMUSCULAR | Status: DC | PRN
  Administered 2017-11-21: 50 ug via INTRAVENOUS

## 2017-11-21 MED ORDER — MIDAZOLAM HCL 2 MG/2ML IJ SOLN
INTRAMUSCULAR | Status: AC
  Filled 2017-11-21: qty 2

## 2017-11-21 MED ORDER — SODIUM CHLORIDE 0.9 % IV SOLN: 250.0000 mL | INTRAVENOUS | Status: DC | PRN

## 2017-11-21 SURGICAL SUPPLY — 22 items
BALLN MUSTANG 7X60X75 (BALLOONS) ×3
BALLOON MUSTANG 7X60X75 (BALLOONS) IMPLANT
CATH ANGIO 5F BER2 65CM (CATHETERS) ×2 IMPLANT
CATH OMNI FLUSH 5F 65CM (CATHETERS) ×1 IMPLANT
CLOSURE MYNX CONTROL 6F/7F (Vascular Products) ×1 IMPLANT
GUIDEWIRE ANGLED .035X260CM (WIRE) ×3 IMPLANT
KIT ENCORE 26 ADVANTAGE (KITS) ×2 IMPLANT
KIT MICROPUNCTURE NIT STIFF (SHEATH) ×3 IMPLANT
KIT PV (KITS) ×3 IMPLANT
SHEATH BRITE TIP 7FR 35CM (SHEATH) ×2 IMPLANT
SHEATH PINNACLE 5F 10CM (SHEATH) ×1 IMPLANT
SHEATH PINNACLE 7F 10CM (SHEATH) ×2 IMPLANT
SHEATH PROBE COVER 6X72 (BAG) ×2 IMPLANT
STENT INNOVA 7X60X130 (Permanent Stent) ×2 IMPLANT
STENT VIABAHN 7X29X80 VBX (Permanent Stent) ×1 IMPLANT
STENT VIABAHNBX 7X79X80 (Permanent Stent) ×1 IMPLANT
SYR MEDRAD MARK V 150ML (SYRINGE) ×3 IMPLANT
TRANSDUCER W/STOPCOCK (MISCELLANEOUS) ×3 IMPLANT
TRAY PV CATH (CUSTOM PROCEDURE TRAY) ×3 IMPLANT
WIRE ROSEN-J .035X180CM (WIRE) ×1 IMPLANT
WIRE ROSEN-J .035X260CM (WIRE) ×1 IMPLANT
WIRE TORQFLEX AUST .018X40CM (WIRE) ×2 IMPLANT

## 2017-11-21 NOTE — Progress Notes (Signed)
RN ambulated with patient in hallway prior to discharge.  Upon return to room RN checked bilateral groin and noticed bleeding from right groin site.  Patient head of bed lowered and pressure applied to groin.  RN had Ivin Booty, [place] call cath lab, Elmo Putt, RN and Ramond Marrow, RN came over to assist with pressure.  Pressure held for approx 10 minutes between primary RN and cath lab RN.   Primary, RN spoke with Dr. Donzetta Matters.  Informed Dr. Donzetta Matters that patient bleeding/oozing under control and no hematoma formed.  Informed Dr. Donzetta Matters that patient does home PD and uses 5,000 units Heparin during PD.  Dr. Donzetta Matters said okay for patient to go home and do PD, just have patient and son check groin sites frequently during and after.  Dr. Donzetta Matters said ambulate patient at 1900 and if no bleeding can send patient home.

## 2017-11-21 NOTE — H&P (Signed)
   History and Physical Update  The patient was interviewed and re-examined.  The patient's previous History and Physical has been reviewed and is unchanged from recent office visit.  Plan for angiogram from right common femoral approach.  Kristen Bradley C. Donzetta Matters, MD [facility] Office: [phone] Pager: [phone]  11/21/2017, 12:29 PM

## 2017-11-21 NOTE — Discharge Instructions (Signed)

## 2017-11-21 NOTE — Progress Notes (Signed)
RN ambulated with patient. No oozing/bleeding or hematoma noted.

## 2017-11-21 NOTE — Op Note (Signed)
Patient name: Kristen Bradley MRN: [id number] DOB: May 27, 1945 Sex: female  11/21/2017 Pre-operative Diagnosis: End-stage renal disease, critical bilateral lower extremity ischemia with rest pain Post-operative diagnosis:  Same Surgeon:  Eda Paschal. Donzetta Matters, MD Procedure Performed: 1.  Ultrasound-guided cannulation bilateral common femoral arteries 2.  Aortogram with bilateral lower extremity runoff 3.  Stent of bilateral common iliac arteries with right-sided 7 x 79 mm VBX and left-sided 7 x 29 mm VBX 4.  Stent of right external iliac artery with 7 x 60 mm Innova 5.  Minx device closure bilateral common femoral arteries 6.  Moderate sedation with fentanyl and Versed for 70 minutes  Indications: 72 year old female with history of known peripheral arterial disease.  She now has severe depression of her bilateral ABIs with rest pain left greater than right.  She is indicated for angiogram bilateral lower extremity runoff and possible intervention we will start with evaluation of the left side.  Findings: The distal aorta has what appears to be a small saccular aneurysm.  The bilateral common iliac arteries are heavily diseased and subtotally occluded.  On the right side this extended all the way down to the distal external iliac artery.  After stenting there is 0% stenosis residual in the bilateral common iliac arteries and there is may be 50% stenosis on the left external iliac artery distally but no stenosis in the external iliac artery and the right where this was stented.  Bilateral common femoral arteries are heavily diseased and the superficial femoral arteries are occluded at their origin.  On the right side she reconstitutes what appears to be below-knee popliteal artery which is quite diminutive maybe 3 mm with runoff may be via all tibial arteries.  On the left side she reconstitutes and above-knee popliteal artery but this is very diseased below-knee popliteal artery is of good size and is not  disease.  She initially has 3 vessels anterior tibial runs off to the ankle gives out in the posterior tibial artery occludes and reconstitutes at the ankle.  If she continues to have pain she will need consideration of bilateral common femoral endarterectomies and bypass surgery.  I will have her follow-up with vein mapping.   Procedure:  The patient was identified in the holding area and taken to room 8.  The patient was then placed supine on the table and prepped and draped in the usual sterile fashion.  A time out was called.  Ultrasound was used to evaluate the right common femoral artery this was cannulated with direct ultrasound visualization with micropuncture needle followed by wire and sheath.  An image was saved the permanent record.  We placed a Bentson wire up to where the artery occluded and then exchanged for a 5 French sheath.  We used a Glidewire and bare catheter to cross the subtotally occluded common iliac artery placed an Omni Flush catheter in the aorta performed aortogram.  We attempted to perform bilateral lotion runoff but unfortunately this only demonstrate really the left lower extremity with the above findings.  We then elected to stent the bilateral common iliac arteries.  We used ultrasound to identify the left common femoral artery and this was noted to be quite disease.  We were able to cannulated with direct visualization with micropuncture needle followed by wire and sheath.  An image was saved the permanent record.  We placed a Bentson wire up to the occluded segment placed a 5 French sheath.  Using Glidewire and bare catheter were able  to traverse into the aorta confirmed intraluminal access.  We then placed stiff wires into the aorta bilaterally and exchanged for long 7 French sheath.  Patient was fully heparinized.  We then deployed a 7 x 79 mm right and 7 x 29 mm left-sided VBX stents.  We then performed again angiogram but there was hardly any runoff on the right side.  We  pulled back our sheath on the right until the contrasted flow.  We then brought a 7 x 60 self-expanding stent and deployed this and postdilated with 7 mm balloon.  We then performed angiogram which demonstrated patency of the external and common iliac arteries on the right.  We then performed right lower extremity angiogram with the above findings.  She will need consideration bilateral common femoral arteries and bilateral femoral to below-knee popliteal artery bypasses.  We will get her vein mapped prior to her next follow-up.  Contrast: 210cc    Severo Beber C. Donzetta Matters, MD [facility] Office: [phone] Pager: [phone]

## 2017-11-21 NOTE — Progress Notes (Signed)
K+ = 2.6  Dr Donzetta Matters informed and stated no need to treat.

## 2017-11-22 ENCOUNTER — Encounter (HOSPITAL_COMMUNITY): Payer: Self-pay | Admitting: Vascular Surgery

## 2017-11-22 DIAGNOSIS — N2581 Secondary hyperparathyroidism of renal origin: Secondary | ICD-10-CM | POA: Diagnosis not present

## 2017-11-22 DIAGNOSIS — N186 End stage renal disease: Secondary | ICD-10-CM | POA: Diagnosis not present

## 2017-11-23 ENCOUNTER — Telehealth: Payer: Self-pay | Admitting: Vascular Surgery

## 2017-11-23 DIAGNOSIS — N186 End stage renal disease: Secondary | ICD-10-CM | POA: Diagnosis not present

## 2017-11-23 DIAGNOSIS — N2581 Secondary hyperparathyroidism of renal origin: Secondary | ICD-10-CM | POA: Diagnosis not present

## 2017-11-23 NOTE — Telephone Encounter (Signed)
-----   Message from Waynetta Sandy, MD sent at 11/21/2017  4:34 PM EDT ----- Kristen Bradley [id number] 25-Feb-1945  11/21/2017 Pre-operative Diagnosis: End-stage renal disease, critical bilateral lower extremity ischemia with rest pain  Surgeon:  Eda Paschal. Donzetta Matters, MD  Procedure Performed: 1.  Ultrasound-guided cannulation bilateral common femoral arteries 2.  Aortogram with bilateral lower extremity runoff 3.  Stent of bilateral common iliac arteries with right-sided 7 x 79 mm VBX and left-sided 7 x 29 mm VBX 4.  Stent of right external iliac artery with 7 x 60 mm Innova 5.  Minx device closure bilateral common femoral arteries 6.  Moderate sedation with fentanyl and Versed for 70 minutes  F/u in 3-4 weeks with me with ABI and vein mapping for surgical discussion

## 2017-11-23 NOTE — Telephone Encounter (Signed)
sch appt spk to pt mld ltr 12/23/17 3pm Sap vein mapping 4pm ABI 4pm p/o MD

## 2017-11-24 DIAGNOSIS — N2581 Secondary hyperparathyroidism of renal origin: Secondary | ICD-10-CM | POA: Diagnosis not present

## 2017-11-24 DIAGNOSIS — N186 End stage renal disease: Secondary | ICD-10-CM | POA: Diagnosis not present

## 2017-11-25 ENCOUNTER — Inpatient Hospital Stay (HOSPITAL_COMMUNITY): Payer: Medicare HMO | Admitting: Anesthesiology

## 2017-11-25 ENCOUNTER — Encounter (HOSPITAL_COMMUNITY): Payer: Self-pay | Admitting: Anesthesiology

## 2017-11-25 ENCOUNTER — Encounter: Payer: Self-pay | Admitting: Vascular Surgery

## 2017-11-25 ENCOUNTER — Inpatient Hospital Stay (HOSPITAL_COMMUNITY): Admission: AD | Admit: 2017-11-25 | Payer: Medicare HMO | Source: Ambulatory Visit | Admitting: Vascular Surgery

## 2017-11-25 ENCOUNTER — Other Ambulatory Visit: Payer: Self-pay | Admitting: Vascular Surgery

## 2017-11-25 ENCOUNTER — Inpatient Hospital Stay (HOSPITAL_COMMUNITY)
Admission: RE | Admit: 2017-11-25 | Discharge: 2017-11-28 | DRG: 252 | Disposition: A | Discharge status: Home / Self Care | Payer: Medicare HMO | Attending: Vascular Surgery | Admitting: Vascular Surgery

## 2017-11-25 ENCOUNTER — Telehealth: Payer: Self-pay | Admitting: *Deleted

## 2017-11-25 ENCOUNTER — Encounter (HOSPITAL_COMMUNITY)
Admission: RE | Disposition: A | Discharge status: Home / Self Care | Payer: Self-pay | Source: Home / Self Care | Attending: Vascular Surgery

## 2017-11-25 ENCOUNTER — Ambulatory Visit (INDEPENDENT_AMBULATORY_CARE_PROVIDER_SITE_OTHER): Payer: Medicare HMO | Admitting: Vascular Surgery

## 2017-11-25 ENCOUNTER — Ambulatory Visit (INDEPENDENT_AMBULATORY_CARE_PROVIDER_SITE_OTHER)
Admission: RE | Admit: 2017-11-25 | Discharge: 2017-11-25 | Disposition: A | Discharge status: Home / Self Care | Payer: Medicare HMO | Source: Ambulatory Visit | Attending: Family | Admitting: Family

## 2017-11-25 ENCOUNTER — Other Ambulatory Visit: Payer: Self-pay

## 2017-11-25 VITALS — BP 99/57 | HR 72 | Temp 97.5°F | Resp 16 | Ht 66.0 in | Wt 143.0 lb

## 2017-11-25 DIAGNOSIS — Z992 Dependence on renal dialysis: Secondary | ICD-10-CM | POA: Diagnosis not present

## 2017-11-25 DIAGNOSIS — I2510 Atherosclerotic heart disease of native coronary artery without angina pectoris: Secondary | ICD-10-CM | POA: Diagnosis not present

## 2017-11-25 DIAGNOSIS — I779 Disorder of arteries and arterioles, unspecified: Secondary | ICD-10-CM

## 2017-11-25 DIAGNOSIS — E1151 Type 2 diabetes mellitus with diabetic peripheral angiopathy without gangrene: Secondary | ICD-10-CM | POA: Diagnosis not present

## 2017-11-25 DIAGNOSIS — N2581 Secondary hyperparathyroidism of renal origin: Secondary | ICD-10-CM | POA: Diagnosis not present

## 2017-11-25 DIAGNOSIS — Z853 Personal history of malignant neoplasm of breast: Secondary | ICD-10-CM

## 2017-11-25 DIAGNOSIS — I129 Hypertensive chronic kidney disease with stage 1 through stage 4 chronic kidney disease, or unspecified chronic kidney disease: Secondary | ICD-10-CM | POA: Diagnosis not present

## 2017-11-25 DIAGNOSIS — I6523 Occlusion and stenosis of bilateral carotid arteries: Secondary | ICD-10-CM | POA: Diagnosis present

## 2017-11-25 DIAGNOSIS — I82421 Acute embolism and thrombosis of right iliac vein: Secondary | ICD-10-CM | POA: Diagnosis not present

## 2017-11-25 DIAGNOSIS — I739 Peripheral vascular disease, unspecified: Secondary | ICD-10-CM | POA: Diagnosis present

## 2017-11-25 DIAGNOSIS — E876 Hypokalemia: Secondary | ICD-10-CM | POA: Diagnosis not present

## 2017-11-25 DIAGNOSIS — Z885 Allergy status to narcotic agent status: Secondary | ICD-10-CM

## 2017-11-25 DIAGNOSIS — I5022 Chronic systolic (congestive) heart failure: Secondary | ICD-10-CM | POA: Diagnosis present

## 2017-11-25 DIAGNOSIS — I120 Hypertensive chronic kidney disease with stage 5 chronic kidney disease or end stage renal disease: Secondary | ICD-10-CM | POA: Diagnosis not present

## 2017-11-25 DIAGNOSIS — D631 Anemia in chronic kidney disease: Secondary | ICD-10-CM | POA: Diagnosis not present

## 2017-11-25 DIAGNOSIS — Z888 Allergy status to other drugs, medicaments and biological substances status: Secondary | ICD-10-CM

## 2017-11-25 DIAGNOSIS — Z7982 Long term (current) use of aspirin: Secondary | ICD-10-CM

## 2017-11-25 DIAGNOSIS — N186 End stage renal disease: Secondary | ICD-10-CM | POA: Diagnosis present

## 2017-11-25 DIAGNOSIS — I447 Left bundle-branch block, unspecified: Secondary | ICD-10-CM | POA: Diagnosis present

## 2017-11-25 DIAGNOSIS — I252 Old myocardial infarction: Secondary | ICD-10-CM

## 2017-11-25 DIAGNOSIS — Z87891 Personal history of nicotine dependence: Secondary | ICD-10-CM

## 2017-11-25 DIAGNOSIS — Z8673 Personal history of transient ischemic attack (TIA), and cerebral infarction without residual deficits: Secondary | ICD-10-CM | POA: Diagnosis not present

## 2017-11-25 DIAGNOSIS — I998 Other disorder of circulatory system: Secondary | ICD-10-CM | POA: Diagnosis not present

## 2017-11-25 DIAGNOSIS — I132 Hypertensive heart and chronic kidney disease with heart failure and with stage 5 chronic kidney disease, or end stage renal disease: Secondary | ICD-10-CM | POA: Diagnosis not present

## 2017-11-25 DIAGNOSIS — Z79899 Other long term (current) drug therapy: Secondary | ICD-10-CM

## 2017-11-25 DIAGNOSIS — E1122 Type 2 diabetes mellitus with diabetic chronic kidney disease: Secondary | ICD-10-CM | POA: Diagnosis not present

## 2017-11-25 DIAGNOSIS — Z833 Family history of diabetes mellitus: Secondary | ICD-10-CM | POA: Diagnosis not present

## 2017-11-25 DIAGNOSIS — E7800 Pure hypercholesterolemia, unspecified: Secondary | ICD-10-CM | POA: Diagnosis present

## 2017-11-25 DIAGNOSIS — I70223 Atherosclerosis of native arteries of extremities with rest pain, bilateral legs: Secondary | ICD-10-CM | POA: Diagnosis not present

## 2017-11-25 DIAGNOSIS — Z7984 Long term (current) use of oral hypoglycemic drugs: Secondary | ICD-10-CM

## 2017-11-25 DIAGNOSIS — R209 Unspecified disturbances of skin sensation: Secondary | ICD-10-CM | POA: Diagnosis present

## 2017-11-25 DIAGNOSIS — N184 Chronic kidney disease, stage 4 (severe): Secondary | ICD-10-CM | POA: Diagnosis not present

## 2017-11-25 DIAGNOSIS — I70213 Atherosclerosis of native arteries of extremities with intermittent claudication, bilateral legs: Secondary | ICD-10-CM

## 2017-11-25 HISTORY — PX: EMBOLECTOMY: SHX44

## 2017-11-25 HISTORY — PX: ENDARTERECTOMY FEMORAL: SHX5804

## 2017-11-25 LAB — GLUCOSE, CAPILLARY: Glucose-Capillary: 121 mg/dL — ABNORMAL HIGH (ref 70–99)

## 2017-11-25 LAB — POCT I-STAT 4, (NA,K, GLUC, HGB,HCT)
GLUCOSE: 117 mg/dL — AB (ref 70–99)
HEMATOCRIT: 31 % — AB (ref 36.0–46.0)
HEMOGLOBIN: 10.5 g/dL — AB (ref 12.0–15.0)
Potassium: 2.5 mmol/L — CL (ref 3.5–5.1)
Sodium: 139 mmol/L (ref 135–145)

## 2017-11-25 LAB — CBC
HEMATOCRIT: 30.5 % — AB (ref 36.0–46.0)
HEMOGLOBIN: 9.1 g/dL — AB (ref 12.0–15.0)
MCH: 27.9 pg (ref 26.0–34.0)
MCHC: 29.8 g/dL — ABNORMAL LOW (ref 30.0–36.0)
MCV: 93.6 fL (ref 80.0–100.0)
NRBC: 0 % (ref 0.0–0.2)
Platelets: 87 10*3/uL — ABNORMAL LOW (ref 150–400)
RBC: 3.26 MIL/uL — ABNORMAL LOW (ref 3.87–5.11)
RDW: 16.2 % — AB (ref 11.5–15.5)
WBC: 6.6 10*3/uL (ref 4.0–10.5)

## 2017-11-25 LAB — CREATININE, SERUM
Creatinine, Ser: 2.94 mg/dL — ABNORMAL HIGH (ref 0.44–1.00)
GFR, EST AFRICAN AMERICAN: 18 mL/min — AB (ref >60)
GFR, EST NON AFRICAN AMERICAN: 15 mL/min — AB (ref >60)

## 2017-11-25 LAB — TYPE AND SCREEN
ABO/RH(D): O POS
Antibody Screen: NEGATIVE

## 2017-11-25 LAB — HEMOGLOBIN A1C
Hgb A1c MFr Bld: 5.9 % — ABNORMAL HIGH (ref 4.8–5.6)
Mean Plasma Glucose: 122.63 mg/dL

## 2017-11-25 LAB — SURGICAL PCR SCREEN
MRSA, PCR: NEGATIVE
STAPHYLOCOCCUS AUREUS: NEGATIVE

## 2017-11-25 SURGERY — ENDARTERECTOMY, FEMORAL
Anesthesia: General | Laterality: Right

## 2017-11-25 MED ORDER — ACETAMINOPHEN 325 MG PO TABS
325.0000 mg | ORAL_TABLET | ORAL | Status: DC | PRN
  Administered 2017-11-28: 650 mg via ORAL
  Filled 2017-11-25: qty 2

## 2017-11-25 MED ORDER — NITROGLYCERIN 0.4 MG SL SUBL: 0.4000 mg | SUBLINGUAL_TABLET | SUBLINGUAL | Status: DC | PRN

## 2017-11-25 MED ORDER — CEFAZOLIN SODIUM-DEXTROSE 2-4 GM/100ML-% IV SOLN
2.0000 g | Freq: Two times a day (BID) | INTRAVENOUS | Status: DC
  Administered 2017-11-26: 2 g via INTRAVENOUS
  Filled 2017-11-25: qty 100

## 2017-11-25 MED ORDER — MEPERIDINE HCL 50 MG/ML IJ SOLN: 6.2500 mg | INTRAMUSCULAR | Status: DC | PRN

## 2017-11-25 MED ORDER — ROSUVASTATIN CALCIUM 10 MG PO TABS
20.0000 mg | ORAL_TABLET | Freq: Every day | ORAL | Status: DC
  Administered 2017-11-26 – 2017-11-28 (×3): 20 mg via ORAL
  Filled 2017-11-25 (×4): qty 2

## 2017-11-25 MED ORDER — CLOPIDOGREL BISULFATE 75 MG PO TABS
75.0000 mg | ORAL_TABLET | Freq: Every day | ORAL | Status: DC
  Administered 2017-11-26 – 2017-11-28 (×3): 75 mg via ORAL
  Filled 2017-11-25 (×3): qty 1

## 2017-11-25 MED ORDER — SODIUM CHLORIDE 0.9 % IV SOLN
INTRAVENOUS | Status: AC
  Filled 2017-11-25: qty 1.2

## 2017-11-25 MED ORDER — LACTATED RINGERS IV SOLN: INTRAVENOUS | Status: DC

## 2017-11-25 MED ORDER — FENTANYL CITRATE (PF) 250 MCG/5ML IJ SOLN
INTRAMUSCULAR | Status: AC
  Filled 2017-11-25: qty 5

## 2017-11-25 MED ORDER — HEPARIN SODIUM (PORCINE) 5000 UNIT/ML IJ SOLN
5000.0000 [IU] | Freq: Three times a day (TID) | INTRAMUSCULAR | Status: DC
  Administered 2017-11-26 – 2017-11-28 (×7): 5000 [IU] via SUBCUTANEOUS
  Filled 2017-11-25 (×8): qty 1

## 2017-11-25 MED ORDER — PANTOPRAZOLE SODIUM 40 MG PO TBEC
40.0000 mg | DELAYED_RELEASE_TABLET | Freq: Every day | ORAL | Status: DC
  Administered 2017-11-26 – 2017-11-28 (×3): 40 mg via ORAL
  Filled 2017-11-25 (×4): qty 1

## 2017-11-25 MED ORDER — FUROSEMIDE 80 MG PO TABS
80.0000 mg | ORAL_TABLET | Freq: Every day | ORAL | Status: DC
  Administered 2017-11-26 – 2017-11-28 (×3): 80 mg via ORAL
  Filled 2017-11-25 (×3): qty 1

## 2017-11-25 MED ORDER — THROMBIN 20000 UNITS EX KIT
PACK | CUTANEOUS | Status: DC | PRN
  Administered 2017-11-25: 10 mL via TOPICAL

## 2017-11-25 MED ORDER — ROCURONIUM BROMIDE 10 MG/ML (PF) SYRINGE
PREFILLED_SYRINGE | INTRAVENOUS | Status: DC | PRN
  Administered 2017-11-25: 30 mg via INTRAVENOUS
  Administered 2017-11-25: 20 mg via INTRAVENOUS

## 2017-11-25 MED ORDER — SUCCINYLCHOLINE CHLORIDE 200 MG/10ML IV SOSY
PREFILLED_SYRINGE | INTRAVENOUS | Status: DC | PRN
  Administered 2017-11-25: 80 mg via INTRAVENOUS

## 2017-11-25 MED ORDER — PROPOFOL 10 MG/ML IV BOLUS
INTRAVENOUS | Status: DC | PRN
  Administered 2017-11-25 (×2): 20 mg via INTRAVENOUS
  Administered 2017-11-25: 50 mg via INTRAVENOUS

## 2017-11-25 MED ORDER — DOCUSATE SODIUM 100 MG PO CAPS
100.0000 mg | ORAL_CAPSULE | Freq: Every day | ORAL | Status: DC
  Administered 2017-11-28: 100 mg via ORAL
  Filled 2017-11-25 (×3): qty 1

## 2017-11-25 MED ORDER — HYDRALAZINE HCL 20 MG/ML IJ SOLN: 5.0000 mg | INTRAMUSCULAR | Status: DC | PRN

## 2017-11-25 MED ORDER — SODIUM CHLORIDE 0.9 % IV SOLN: 500.0000 mL | Freq: Once | INTRAVENOUS | Status: DC | PRN

## 2017-11-25 MED ORDER — POTASSIUM CHLORIDE 10 MEQ/100ML IV SOLN
10.0000 meq | Freq: Once | INTRAVENOUS | Status: AC
  Administered 2017-11-25: 10 meq via INTRAVENOUS
  Filled 2017-11-25: qty 100

## 2017-11-25 MED ORDER — SODIUM CHLORIDE 0.9 % IV SOLN
INTRAVENOUS | Status: DC | PRN
  Administered 2017-11-25: 18:00:00 via INTRAVENOUS

## 2017-11-25 MED ORDER — SODIUM CHLORIDE 0.9 % IV SOLN
INTRAVENOUS | Status: DC
  Administered 2017-11-25: 18:00:00 via INTRAVENOUS

## 2017-11-25 MED ORDER — NEOSTIGMINE METHYLSULFATE 10 MG/10ML IV SOLN
INTRAVENOUS | Status: DC | PRN
  Administered 2017-11-25: 3 mg via INTRAVENOUS

## 2017-11-25 MED ORDER — FENTANYL CITRATE (PF) 100 MCG/2ML IJ SOLN
25.0000 ug | INTRAMUSCULAR | Status: DC | PRN
  Administered 2017-11-25: 25 ug via INTRAVENOUS

## 2017-11-25 MED ORDER — POTASSIUM CHLORIDE CRYS ER 20 MEQ PO TBCR: 20.0000 meq | EXTENDED_RELEASE_TABLET | Freq: Every day | ORAL | Status: DC | PRN

## 2017-11-25 MED ORDER — OXYCODONE-ACETAMINOPHEN 5-325 MG PO TABS
1.0000 | ORAL_TABLET | ORAL | Status: DC | PRN
  Administered 2017-11-26 (×2): 2 via ORAL
  Administered 2017-11-27 (×2): 1 via ORAL
  Filled 2017-11-25 (×2): qty 1
  Filled 2017-11-25 (×2): qty 2

## 2017-11-25 MED ORDER — PHENOL 1.4 % MT LIQD: 1.0000 | OROMUCOSAL | Status: DC | PRN

## 2017-11-25 MED ORDER — LIDOCAINE 2% (20 MG/ML) 5 ML SYRINGE
INTRAMUSCULAR | Status: DC | PRN
  Administered 2017-11-25: 40 mg via INTRAVENOUS

## 2017-11-25 MED ORDER — GUAIFENESIN-DM 100-10 MG/5ML PO SYRP: 15.0000 mL | ORAL_SOLUTION | ORAL | Status: DC | PRN

## 2017-11-25 MED ORDER — MAGNESIUM SULFATE 2 GM/50ML IV SOLN: 2.0000 g | Freq: Every day | INTRAVENOUS | Status: DC | PRN

## 2017-11-25 MED ORDER — ASPIRIN EC 81 MG PO TBEC
162.0000 mg | DELAYED_RELEASE_TABLET | Freq: Once | ORAL | Status: DC
  Filled 2017-11-25: qty 2

## 2017-11-25 MED ORDER — CEFAZOLIN SODIUM-DEXTROSE 2-4 GM/100ML-% IV SOLN
2.0000 g | Freq: Once | INTRAVENOUS | Status: AC
  Administered 2017-11-25: 2 g via INTRAVENOUS
  Filled 2017-11-25: qty 100

## 2017-11-25 MED ORDER — PROTAMINE SULFATE 10 MG/ML IV SOLN
INTRAVENOUS | Status: DC | PRN
  Administered 2017-11-25: 10 mg via INTRAVENOUS

## 2017-11-25 MED ORDER — DEXAMETHASONE SODIUM PHOSPHATE 4 MG/ML IJ SOLN
INTRAMUSCULAR | Status: DC | PRN
  Administered 2017-11-25: 5 mg via INTRAVENOUS

## 2017-11-25 MED ORDER — EZETIMIBE 10 MG PO TABS
5.0000 mg | ORAL_TABLET | Freq: Every day | ORAL | Status: DC
  Administered 2017-11-26 – 2017-11-28 (×3): 5 mg via ORAL
  Filled 2017-11-25 (×3): qty 1

## 2017-11-25 MED ORDER — MUPIROCIN 2 % EX OINT
1.0000 "application " | TOPICAL_OINTMENT | Freq: Once | CUTANEOUS | Status: AC
  Administered 2017-11-25: 1 via TOPICAL

## 2017-11-25 MED ORDER — PROMETHAZINE HCL 25 MG/ML IJ SOLN: 6.2500 mg | INTRAMUSCULAR | Status: DC | PRN

## 2017-11-25 MED ORDER — MORPHINE SULFATE (PF) 2 MG/ML IV SOLN: 2.0000 mg | INTRAVENOUS | Status: DC | PRN

## 2017-11-25 MED ORDER — INSULIN ASPART 100 UNIT/ML ~~LOC~~ SOLN
0.0000 [IU] | Freq: Three times a day (TID) | SUBCUTANEOUS | Status: DC
  Administered 2017-11-26: 2 [IU] via SUBCUTANEOUS

## 2017-11-25 MED ORDER — SODIUM CHLORIDE 0.9 % IV SOLN
INTRAVENOUS | Status: DC | PRN
  Administered 2017-11-25: 15 ug/min via INTRAVENOUS

## 2017-11-25 MED ORDER — HEPARIN SODIUM (PORCINE) 1000 UNIT/ML IJ SOLN
INTRAMUSCULAR | Status: DC | PRN
  Administered 2017-11-25: 2000 [IU] via INTRAVENOUS
  Administered 2017-11-25: 7000 [IU] via INTRAVENOUS

## 2017-11-25 MED ORDER — ONDANSETRON HCL 4 MG/2ML IJ SOLN: 4.0000 mg | Freq: Four times a day (QID) | INTRAMUSCULAR | Status: DC | PRN

## 2017-11-25 MED ORDER — ONDANSETRON HCL 4 MG/2ML IJ SOLN
INTRAMUSCULAR | Status: DC | PRN
  Administered 2017-11-25: 4 mg via INTRAVENOUS

## 2017-11-25 MED ORDER — PROPOFOL 10 MG/ML IV BOLUS
INTRAVENOUS | Status: AC
  Filled 2017-11-25: qty 20

## 2017-11-25 MED ORDER — FENTANYL CITRATE (PF) 100 MCG/2ML IJ SOLN
INTRAMUSCULAR | Status: DC | PRN
  Administered 2017-11-25: 50 ug via INTRAVENOUS
  Administered 2017-11-25 (×2): 25 ug via INTRAVENOUS
  Administered 2017-11-25: 50 ug via INTRAVENOUS

## 2017-11-25 MED ORDER — PHENYLEPHRINE 40 MCG/ML (10ML) SYRINGE FOR IV PUSH (FOR BLOOD PRESSURE SUPPORT)
PREFILLED_SYRINGE | INTRAVENOUS | Status: DC | PRN
  Administered 2017-11-25: 40 ug via INTRAVENOUS

## 2017-11-25 MED ORDER — METOPROLOL TARTRATE 5 MG/5ML IV SOLN: 2.0000 mg | INTRAVENOUS | Status: DC | PRN

## 2017-11-25 MED ORDER — FENTANYL CITRATE (PF) 100 MCG/2ML IJ SOLN
INTRAMUSCULAR | Status: AC
  Filled 2017-11-25: qty 2

## 2017-11-25 MED ORDER — CARVEDILOL 6.25 MG PO TABS
6.2500 mg | ORAL_TABLET | Freq: Two times a day (BID) | ORAL | Status: DC
  Administered 2017-11-26 – 2017-11-28 (×2): 6.25 mg via ORAL
  Filled 2017-11-25 (×3): qty 1

## 2017-11-25 MED ORDER — ISOSORBIDE MONONITRATE ER 60 MG PO TB24
60.0000 mg | ORAL_TABLET | Freq: Every day | ORAL | Status: DC
  Administered 2017-11-26: 60 mg via ORAL
  Filled 2017-11-25: qty 1

## 2017-11-25 MED ORDER — THROMBIN 5000 UNITS EX SOLR
CUTANEOUS | Status: AC
  Filled 2017-11-25: qty 10000

## 2017-11-25 MED ORDER — ACETAMINOPHEN 325 MG RE SUPP: 325.0000 mg | RECTAL | Status: DC | PRN

## 2017-11-25 MED ORDER — HYDRALAZINE HCL 25 MG PO TABS
25.0000 mg | ORAL_TABLET | Freq: Every day | ORAL | Status: DC
  Administered 2017-11-26: 25 mg via ORAL
  Filled 2017-11-25: qty 1

## 2017-11-25 MED ORDER — LABETALOL HCL 5 MG/ML IV SOLN: 10.0000 mg | INTRAVENOUS | Status: DC | PRN

## 2017-11-25 MED ORDER — 0.9 % SODIUM CHLORIDE (POUR BTL) OPTIME
TOPICAL | Status: DC | PRN
  Administered 2017-11-25: 1000 mL
  Administered 2017-11-25: 2000 mL

## 2017-11-25 MED ORDER — ALUM & MAG HYDROXIDE-SIMETH 200-200-20 MG/5ML PO SUSP: 15.0000 mL | ORAL | Status: DC | PRN

## 2017-11-25 MED ORDER — SODIUM CHLORIDE 0.9 % IV SOLN
INTRAVENOUS | Status: DC | PRN
  Administered 2017-11-25: 17:00:00

## 2017-11-25 MED ORDER — HEMOSTATIC AGENTS (NO CHARGE) OPTIME
TOPICAL | Status: DC | PRN
  Administered 2017-11-25 (×3): 1 via TOPICAL

## 2017-11-25 MED ORDER — GLYCOPYRROLATE 0.2 MG/ML IJ SOLN
INTRAMUSCULAR | Status: DC | PRN
  Administered 2017-11-25: 0.4 mg via INTRAVENOUS

## 2017-11-25 MED ORDER — SODIUM CHLORIDE 0.9 % IV SOLN
INTRAVENOUS | Status: DC
  Administered 2017-11-25 – 2017-11-26 (×2): via INTRAVENOUS

## 2017-11-25 MED ORDER — MUPIROCIN 2 % EX OINT
TOPICAL_OINTMENT | CUTANEOUS | Status: AC
  Administered 2017-11-25: 1 via TOPICAL
  Filled 2017-11-25: qty 22

## 2017-11-25 SURGICAL SUPPLY — 66 items
ADH SKN CLS APL DERMABOND .7 (GAUZE/BANDAGES/DRESSINGS) ×1
AGENT HMST SPONGE THK3/8 (HEMOSTASIS)
BANDAGE ESMARK 6X9 LF (GAUZE/BANDAGES/DRESSINGS) IMPLANT
BNDG CMPR 9X6 STRL LF SNTH (GAUZE/BANDAGES/DRESSINGS)
BNDG ESMARK 6X9 LF (GAUZE/BANDAGES/DRESSINGS)
CANISTER SUCT 3000ML PPV (MISCELLANEOUS) ×3 IMPLANT
CATH EMB 3FR 40CM (CATHETERS) ×2 IMPLANT
CATH EMB 3FR 80CM (CATHETERS) IMPLANT
CATH EMB 4FR 40CM (CATHETERS) ×2 IMPLANT
CATH EMB 4FR 80CM (CATHETERS) IMPLANT
CATH EMB 5FR 80CM (CATHETERS) IMPLANT
CLIP VESOCCLUDE MED 24/CT (CLIP) ×3 IMPLANT
CLIP VESOCCLUDE SM WIDE 24/CT (CLIP) ×3 IMPLANT
CONT SPEC 4OZ CLIKSEAL STRL BL (MISCELLANEOUS) ×2 IMPLANT
COVER WAND RF STERILE (DRAPES) ×3 IMPLANT
CUFF TOURNIQUET SINGLE 18IN (TOURNIQUET CUFF) IMPLANT
CUFF TOURNIQUET SINGLE 24IN (TOURNIQUET CUFF) IMPLANT
CUFF TOURNIQUET SINGLE 34IN LL (TOURNIQUET CUFF) IMPLANT
CUFF TOURNIQUET SINGLE 44IN (TOURNIQUET CUFF) IMPLANT
DERMABOND ADVANCED (GAUZE/BANDAGES/DRESSINGS) ×2
DERMABOND ADVANCED .7 DNX12 (GAUZE/BANDAGES/DRESSINGS) ×1 IMPLANT
DRAIN CHANNEL 15F RND FF W/TCR (WOUND CARE) IMPLANT
DRAPE X-RAY CASS 24X20 (DRAPES) IMPLANT
ELECT REM PT RETURN 9FT ADLT (ELECTROSURGICAL) ×3
ELECTRODE REM PT RTRN 9FT ADLT (ELECTROSURGICAL) ×1 IMPLANT
EVACUATOR SILICONE 100CC (DRAIN) IMPLANT
FELT TEFLON 1X6 (MISCELLANEOUS) ×2 IMPLANT
GLOVE BIO SURGEON STRL SZ 6.5 (GLOVE) ×1 IMPLANT
GLOVE BIO SURGEON STRL SZ7.5 (GLOVE) ×5 IMPLANT
GLOVE BIO SURGEONS STRL SZ 6.5 (GLOVE) ×1
GLOVE BIOGEL PI IND STRL 6.5 (GLOVE) IMPLANT
GLOVE BIOGEL PI IND STRL 7.0 (GLOVE) IMPLANT
GLOVE BIOGEL PI IND STRL 8 (GLOVE) ×1 IMPLANT
GLOVE BIOGEL PI INDICATOR 6.5 (GLOVE) ×6
GLOVE BIOGEL PI INDICATOR 7.0 (GLOVE) ×2
GLOVE BIOGEL PI INDICATOR 8 (GLOVE) ×2
GLOVE ECLIPSE 6.5 STRL STRAW (GLOVE) ×2 IMPLANT
GOWN STRL REUS W/ TWL LRG LVL3 (GOWN DISPOSABLE) ×3 IMPLANT
GOWN STRL REUS W/ TWL XL LVL3 (GOWN DISPOSABLE) ×2 IMPLANT
GOWN STRL REUS W/TWL LRG LVL3 (GOWN DISPOSABLE) ×9
GOWN STRL REUS W/TWL XL LVL3 (GOWN DISPOSABLE) ×12
GRAFT VASC PATCH XENOSURE 1X14 (Vascular Products) ×2 IMPLANT
HEMOSTAT SNOW SURGICEL 2X4 (HEMOSTASIS) ×4 IMPLANT
HEMOSTAT SPONGE AVITENE ULTRA (HEMOSTASIS) IMPLANT
KIT BASIN OR (CUSTOM PROCEDURE TRAY) ×3 IMPLANT
KIT TURNOVER KIT B (KITS) ×3 IMPLANT
NS IRRIG 1000ML POUR BTL (IV SOLUTION) ×6 IMPLANT
PACK PERIPHERAL VASCULAR (CUSTOM PROCEDURE TRAY) ×3 IMPLANT
PAD ARMBOARD 7.5X6 YLW CONV (MISCELLANEOUS) ×6 IMPLANT
SET COLLECT BLD 21X3/4 12 (NEEDLE) IMPLANT
STOPCOCK 4 WAY LG BORE MALE ST (IV SETS) ×4 IMPLANT
SUT MNCRL AB 4-0 PS2 18 (SUTURE) ×3 IMPLANT
SUT PROLENE 5 0 C 1 24 (SUTURE) ×13 IMPLANT
SUT PROLENE 6 0 BV (SUTURE) ×15 IMPLANT
SUT PROLENE 7 0 BV1 MDA (SUTURE) ×2 IMPLANT
SUT SILK 2 0 PERMA HAND 18 BK (SUTURE) IMPLANT
SUT VIC AB 2-0 CT1 27 (SUTURE) ×3
SUT VIC AB 2-0 CT1 TAPERPNT 27 (SUTURE) ×1 IMPLANT
SUT VIC AB 3-0 SH 27 (SUTURE) ×3
SUT VIC AB 3-0 SH 27X BRD (SUTURE) ×1 IMPLANT
SYR 3ML LL SCALE MARK (SYRINGE) ×7 IMPLANT
TOWEL GREEN STERILE (TOWEL DISPOSABLE) ×3 IMPLANT
TRAY FOLEY MTR SLVR 16FR STAT (SET/KITS/TRAYS/PACK) ×3 IMPLANT
TUBING EXTENTION W/L.L. (IV SETS) IMPLANT
UNDERPAD 30X30 (UNDERPADS AND DIAPERS) ×3 IMPLANT
WATER STERILE IRR 1000ML POUR (IV SOLUTION) ×3 IMPLANT

## 2017-11-25 NOTE — Progress Notes (Signed)
Patient to remain flat for remainder of night per Dr. Carlis Abbott.  Reverse trendelenburg okay.

## 2017-11-25 NOTE — Anesthesia Procedure Notes (Signed)
Arterial Line Insertion Start/End10/25/2019 5:51 PM, 11/25/2017 5:53 PM Performed by: Effie Berkshire, MD, anesthesiologist  Patient location: Pre-op. Preanesthetic checklist: patient identified, IV checked, site marked, risks and benefits discussed, surgical consent, monitors and equipment checked, pre-op evaluation, timeout performed and anesthesia consent Lidocaine 1% used for infiltration Left, radial was placed Catheter size: 20 Fr Hand hygiene performed  and maximum sterile barriers used   Attempts: 1 Procedure performed without using ultrasound guided technique. Following insertion, dressing applied. Post procedure assessment: normal and unchanged  Patient tolerated the procedure well with no immediate complications.

## 2017-11-25 NOTE — Telephone Encounter (Signed)
Patient called c/o severe pain, numbness and "cold" left foot. Multiple falls when trying to walk. States she is not able to get in here to the office today at 1300 to see Dr. Donzetta Matters today. Her husband is not there to driver her. Instructed patient to go to the [facility] ER for evaluation ASAP.

## 2017-11-25 NOTE — Progress Notes (Signed)
Patient ID: Kristen Bradley, female   DOB: [birth year], 72 y.o.   MRN: [id number]  Reason for Consult: PAD   Referred by [name], MD  Subjective:     HPI:  Kristen Bradley is a 72 y.o. female presents today having a right numb and cold foot since her procedure on Monday.  Procedure included bilateral iliac stents for really left greater than right sided pain.  States that she has been walking with a cane since the procedure.  She has been taking Plavix as prescribed.  Past Medical History:  Diagnosis Date  . 3-vessel CAD - treated medically  02/26/2016  . Acute renal failure superimposed on stage 3 chronic kidney disease ([place])   . Adrenal tumor 08/2007   surgery  . Aftercare following surgery of the circulatory system, [place] 05/26/2012  . AKI (acute kidney injury) ([place])   . Atherosclerosis of native artery of extremity with intermittent claudication ([place]) 01/26/2013  . CAD, multiple vessel   . Cancer of right breast [facility])    s/p lumpectomy, XRT  . Cardiomyopathy, ischemic   . Carotid artery disease (HCC)    s/p bilateral CEA  . Carotid stenosis, bilateral-s/p Bilateral CEA 11/05/2011  . Chronic ITP (idiopathic thrombocytopenia) (HCC) 02/24/2016  . Chronic systolic (congestive) heart failure ([place]) 11/05/2016  . Chronic systolic heart failure ([place]) 03/03/2016  . CKD (chronic kidney disease) stage 4, GFR 15-29 ml/min (HCC)   . Dyspnea   . Elevated brain natriuretic peptide (BNP) level 01/30/2016  . Elevated troponin I level 01/30/2016  . Hepatitis C    "got treatment for it" (01/29/2016)  . HFrEF (heart failure with reduced ejection fraction) ([place])   . High cholesterol    takes Lopid daily  . History of UTI    takes Diflucan dail--pt states no uti in a couple of yrs though  . Hypertension    takes Losartan,Amlodipine,HCTZ,and Atenolol daily and Clatarpres  . Intermittent claudication ([place]) 11/05/2011  . Ischemic cardiomyopathy    a. 11/2016 s/p MDT BiV ICD w/ his bundle pacing.    . Joint pain    legs  . LBBB- new 01/30/2016  . NSTEMI (non-ST elevated myocardial infarction) ([place])   . Occlusion and stenosis of carotid artery without mention of cerebral infarction 12/17/2011  . PAD (peripheral artery disease) ([place]) 11/26/2011  . Pain in limb 09/08/2012  . Peripheral vascular disease ([place]) 11/05/2011  . Renal artery stenosis (HCC)    Right renal artery stent 05/31/06  . Renal failure syndrome   . Sinus bradycardia   . Sinus pause   . Stroke [facility]) Jan. 7, 2014  . Thrombocytopenia- plts 81K 01/30/2016  . Tobacco abuse   . Type 2 diabetes mellitus with vascular disease ([place])   . Type II diabetes mellitus (HCC)    Family History  Problem Relation Age of Onset  . Cancer Mother        ? type  . Cancer Father        ? type  . Diabetes Sister   . Hypertension Sister   . Diabetes Brother   . Hypertension Brother   . CAD Neg Hx    Past Surgical History:  Procedure Laterality Date  . ABDOMINAL AORTOGRAM W/LOWER EXTREMITY N/A 11/21/2017   Procedure: ABDOMINAL AORTOGRAM W/LOWER EXTREMITY;  Surgeon: Waynetta Sandy, MD;  Location: [place] CV [facility];  Service: Cardiovascular;  Laterality: N/A;  . ABDOMINAL HYSTERECTOMY    . ADRENAL GLAND SURGERY  2009  . BIV  ICD INSERTION CRT-D N/A 11/05/2016   Procedure: BIV ICD INSERTION CRT-D;  Surgeon: Evans Lance, MD;  Location: [place] CV [facility];  Service: Cardiovascular;  Laterality: N/A;  . BREAST LUMPECTOMY Right 2009  . CARDIAC CATHETERIZATION N/A 02/03/2016   Procedure: Right/Left Heart Cath and Coronary Angiography;  Surgeon: Burnell Blanks, MD;  Location: [place] CV [facility];  Service: Cardiovascular;  Laterality: N/A;  . ENDARTERECTOMY  01/05/2012   Procedure: ENDARTERECTOMY CAROTID;  Surgeon: Conrad Westervelt, MD;  Location: [place];  Service: Vascular;  Laterality: Right;  Right Carotid Artery Endarterectomy, right stump pressure, intraoperative ultrasound  . ENDARTERECTOMY  02/08/2012   Procedure:  ENDARTERECTOMY CAROTID;  Surgeon: Conrad Amherstdale, MD;  Location: [place];  Service: Vascular;  Laterality: Left;  . LUMBAR DISC SURGERY    . PATCH ANGIOPLASTY  02/08/2012   Procedure: PATCH ANGIOPLASTY;  Surgeon: Conrad Valdez, MD;  Location[facility] OR;  Service: Vascular;  Laterality: Left;  Carotid artery patch angioplasty using Vascu-Guard bovine patch 1cm x 6cm.  Marland Kitchen PERIPHERAL VASCULAR INTERVENTION Bilateral 11/21/2017   Procedure: PERIPHERAL VASCULAR INTERVENTION;  Surgeon: Waynetta Sandy, MD;  Location: [place] CV LAB;  Service: Cardiovascular;  Laterality: Bilateral;  Iliac Stents  . RENAL ARTERY STENT Right 05/2006    Short Social History:  Social History   Tobacco Use  . Smoking status: Former Smoker    Packs/day: 0.50    Years: 53.00    Pack years: 26.50    Types: Cigarettes    Last attempt to quit: 03/2016    Years since quitting: 1.7  . Smokeless tobacco: Never Used  Substance Use Topics  . Alcohol use: No    Alcohol/week: 0.0 standard drinks    Allergies  Allergen Reactions  . Chlorhexidine Gluconate Itching  . Ace Inhibitors Cough  . Dilaudid [Hydromorphone Hcl] Itching    No current outpatient medications on file.   No current facility-administered medications for this visit.     Review of Systems  Constitutional:  Constitutional negative. HENT: HENT negative.  Eyes: Eyes negative.  Respiratory: Respiratory negative.  GI: Gastrointestinal negative.  Musculoskeletal: Positive for leg pain.  Skin: Skin negative.  Neurological: Positive for numbness.  Hematologic: Hematologic/lymphatic negative.  Psychiatric: Psychiatric negative.        Objective:  Objective   Vitals:   11/25/17 1413  BP: (!) 99/57  Pulse: 72  Resp: 16  Temp: (!) 97.5 F (36.4 C)  TempSrc: Oral  SpO2: 100%  Weight: 143 lb (64.9 kg)  Height: 5\' 6"  (1.676 m)   Body mass index is 23.08 kg/m.  Physical Exam  Constitutional: She appears well-developed.  HENT:  Head:  Normocephalic.  Eyes: Pupils are equal, round, and reactive to light.  Neck: Normal range of motion.  Cardiovascular:  Pulses:      Femoral pulses are 0 on the right side, and 1+ on the left side. No discernible signals in right foot There are monophasic DP and PT signals on the left  Pulmonary/Chest: Effort normal.  Abdominal: Soft. She exhibits no mass.  Musculoskeletal:  Right foot cool to touch  Neurological: She is alert.  Psychiatric: She has a normal mood and affect. Her behavior is normal. Thought content normal.    Data: I have independently interpreted her vein mapping which does not demonstrate any suitable veins for lower extremity bypass bilaterally.     Assessment/Plan:     71 year old female follows up for right lower extremity acute numbness and coolness  in her foot after bilateral common iliac artery stenting with an extension on the right external iliac artery.  At the time she had very sluggish flow in her entire right lower extremity and I thought she would need a common femoral endarterectomy and possible bypass.  Unfortunately she is showed up with [place] 2 a ischemia and she will need to go more urgently to the operating room for common femoral endarterectomy and possible stent embolectomy of her common and external iliac stent on the right.  I discussed this plan with Dr. Carlis Abbott.  Her last meal was at 8 AM and we will get her scheduled this evening.     Waynetta Sandy MD [facility]

## 2017-11-25 NOTE — Anesthesia Preprocedure Evaluation (Addendum)
Anesthesia Evaluation  Patient identified by MRN, date of birth, ID band Patient awake    Reviewed: Allergy & Precautions, NPO status , Patient's Chart, lab work & pertinent test results  Airway Mallampati: I  TM Distance: >3 FB Neck ROM: Full    Dental  (+) Upper Dentures, Dental Advisory Given   Pulmonary former smoker,    breath sounds clear to auscultation       Cardiovascular hypertension, Pt. on home beta blockers and Pt. on medications + CAD, + Peripheral Vascular Disease and +CHF  + dysrhythmias  Rhythm:Regular Rate:Normal     Neuro/Psych CVA    GI/Hepatic negative GI ROS, (+) Hepatitis -  Endo/Other  diabetes, Type 2, Oral Hypoglycemic Agents  Renal/GU CRFRenal disease     Musculoskeletal negative musculoskeletal ROS (+)   Abdominal Normal abdominal exam  (+)   Peds  Hematology negative hematology ROS (+)   Anesthesia Other Findings   Reproductive/Obstetrics                            Lab Results  Component Value Date   WBC 4.0 07/12/2017   HGB 10.5 (L) 11/21/2017   HCT 31.0 (L) 11/21/2017   MCV 93.3 07/12/2017   PLT 103 (L) 07/12/2017   Lab Results  Component Value Date   CREATININE 3.10 (H) 11/21/2017   BUN 19 11/21/2017   NA 137 11/21/2017   K 2.6 (LL) 11/21/2017   CL 103 11/21/2017   CO2 25 10/05/2017   Lab Results  Component Value Date   INR 1.00 02/02/2016   INR 0.99 02/08/2012   INR 0.80 12/27/2011   Echo: - Left ventricle: The cavity size was mildly dilated. Wall   thickness was normal. Systolic function was severely reduced. The   estimated ejection fraction was in the range of 20% to 25%.   Diffuse hypokinesis with disproportionately severe anteroapical   and lateral hypokinesis. Wall motion suggests CAD in the   distribution of multiple vessels. Features are consistent with a   pseudonormal left ventricular filling pattern, with concomitant   abnormal  relaxation and increased filling pressure (grade 2   diastolic dysfunction). - Mitral valve: There was mild to moderate regurgitation directed   centrally. The acceleration rate of the regurgitant jet was   reduced, consistent with a low dP/dt. - Left atrium: The atrium was mildly dilated. - Tricuspid valve: There was mild-moderate regurgitation directed   centrally. - Pulmonary arteries: Systolic pressure was moderately increased.   PA peak pressure: 55 mm Hg (S).  Anesthesia Physical Anesthesia Plan  ASA: IV  Anesthesia Plan: General   Post-op Pain Management:    Induction: Intravenous  PONV Risk Score and Plan: 4 or greater and Ondansetron, Dexamethasone and Treatment may vary due to age or medical condition  Airway Management Planned: Oral ETT  Additional Equipment: Arterial line  Intra-op Plan:   Post-operative Plan: Extubation in OR  Informed Consent: I have reviewed the patients History and Physical, chart, labs and discussed the procedure including the risks, benefits and alternatives for the proposed anesthesia with the patient or authorized representative who has indicated his/her understanding and acceptance.   Dental advisory given  Plan Discussed with: CRNA  Anesthesia Plan Comments:      Anesthesia Quick Evaluation

## 2017-11-25 NOTE — Transfer of Care (Signed)
Immediate Anesthesia Transfer of Care Note  Patient: [name]  Procedure(s) Performed: RIGHT FEMORAL ENDARTERECTOMY WITH PROFUNOPLASTY AND BOVINE PATCH ANGIOPLAST RIGHT ILIOFEMORAL; RIGHT ILIAC THROMBOEMBOLECTOMY (Right ) ILIAC EMBOLECTOMY (Right )  Patient Location: PACU  Anesthesia Type:General  Level of Consciousness: awake, oriented, drowsy and patient cooperative  Airway & Oxygen Therapy: Patient Spontanous Breathing and Patient connected to nasal cannula oxygen  Post-op Assessment: Report given to RN and Post -op Vital signs reviewed and stable  Post vital signs: Reviewed and stable  Last Vitals:  Vitals Value Taken Time  BP 117/51 11/25/2017  8:36 PM  Temp    Pulse 71 11/25/2017  8:42 PM  Resp 15 11/25/2017  8:42 PM  SpO2 100 % 11/25/2017  8:42 PM  Vitals shown include unvalidated device data.  Last Pain:  Vitals:   11/25/17 1717  TempSrc:   PainSc: 7       Patients Stated Pain Goal: 0 (38/38/18 4037)  Complications: No apparent anesthesia complications

## 2017-11-25 NOTE — H&P (Signed)
H&P    Reason for Consult: Admission from clinic for acute on chronic limb ischemia of the right lower extremity Referring Physician: Donzetta Matters MRN #:  [id number]  History of Present Illness: This is a 72 y.o. female with multiple medical comorbidities including coronary artery disease, end-stage renal disease on peritoneal dialysis, heart failure, type 2 diabetes, severe PAD that presents as an admission from clinic.  Patient underwent bilateral common iliac artery stenting and extension of right external iliac artery stenting on Monday by Dr. Donzetta Matters.  She states since that time she has had acute onset of severe numbness and coolness in her right foot with pain.  This was much worse than her baseline.  She was seen in clinic today and there were no audible signals appreciated in her right foot and the right foot was cooler than the left.  She presents today for plan for right femoral endarterectomy with retrograde iliac thrombectomy.   Past Medical History:  Diagnosis Date  . 3-vessel CAD - treated medically  02/26/2016  . Acute renal failure superimposed on stage 3 chronic kidney disease ([place])   . Adrenal tumor 08/2007   surgery  . Aftercare following surgery of the circulatory system, [place] 05/26/2012  . AKI (acute kidney injury) ([place])   . Atherosclerosis of native artery of extremity with intermittent claudication ([place]) 01/26/2013  . CAD, multiple vessel   . Cancer of right breast [facility])    s/p lumpectomy, XRT  . Cardiomyopathy, ischemic   . Carotid artery disease (HCC)    s/p bilateral CEA  . Carotid stenosis, bilateral-s/p Bilateral CEA 11/05/2011  . Chronic ITP (idiopathic thrombocytopenia) (HCC) 02/24/2016  . Chronic systolic (congestive) heart failure ([place]) 11/05/2016  . Chronic systolic heart failure ([place]) 03/03/2016  . CKD (chronic kidney disease) stage 4, GFR 15-29 ml/min (HCC)   . Dyspnea   . Elevated brain natriuretic peptide (BNP) level 01/30/2016  . Elevated troponin I level  01/30/2016  . Hepatitis C    "got treatment for it" (01/29/2016)  . HFrEF (heart failure with reduced ejection fraction) ([place])   . High cholesterol    takes Lopid daily  . History of UTI    takes Diflucan dail--pt states no uti in a couple of yrs though  . Hypertension    takes Losartan,Amlodipine,HCTZ,and Atenolol daily and Clatarpres  . Intermittent claudication ([place]) 11/05/2011  . Ischemic cardiomyopathy    a. 11/2016 s/p MDT BiV ICD w/ his bundle pacing.  . Joint pain    legs  . LBBB- new 01/30/2016  . NSTEMI (non-ST elevated myocardial infarction) ([place])   . Occlusion and stenosis of carotid artery without mention of cerebral infarction 12/17/2011  . PAD (peripheral artery disease) ([place]) 11/26/2011  . Pain in limb 09/08/2012  . Peripheral vascular disease ([place]) 11/05/2011  . Renal artery stenosis (HCC)    Right renal artery stent 05/31/06  . Renal failure syndrome   . Sinus bradycardia   . Sinus pause   . Stroke [facility]) Jan. 7, 2014  . Thrombocytopenia- plts 81K 01/30/2016  . Tobacco abuse   . Type 2 diabetes mellitus with vascular disease ([place])   . Type II diabetes mellitus ([place])     Past Surgical History:  Procedure Laterality Date  . ABDOMINAL AORTOGRAM W/LOWER EXTREMITY N/A 11/21/2017   Procedure: ABDOMINAL AORTOGRAM W/LOWER EXTREMITY;  Surgeon: Waynetta Sandy, MD;  Location: [place] CV LAB;  Service: Cardiovascular;  Laterality: N/A;  . ABDOMINAL HYSTERECTOMY    . ADRENAL GLAND SURGERY  2009  . BIV ICD INSERTION CRT-D N/A 11/05/2016   Procedure: BIV ICD INSERTION CRT-D;  Surgeon: Evans Lance, MD;  Location: [place] [facility];  Service: Cardiovascular;  Laterality: N/A;  . BREAST LUMPECTOMY Right 2009  . CARDIAC CATHETERIZATION N/A 02/03/2016   Procedure: Right/Left Heart Cath and Coronary Angiography;  Surgeon: Burnell Blanks, MD;  Location: [place] CV LAB;  Service: Cardiovascular;  Laterality: N/A;  . ENDARTERECTOMY  01/05/2012   Procedure:  ENDARTERECTOMY CAROTID;  Surgeon: Conrad Knik-Fairview, MD;  Location: [place];  Service: Vascular;  Laterality: Right;  Right Carotid Artery Endarterectomy, right stump pressure, intraoperative ultrasound  . ENDARTERECTOMY  02/08/2012   Procedure: ENDARTERECTOMY CAROTID;  Surgeon: Conrad Collins, MD;  Location: [place];  Service: Vascular;  Laterality: Left;  . LUMBAR DISC SURGERY    . PATCH ANGIOPLASTY  02/08/2012   Procedure: PATCH ANGIOPLASTY;  Surgeon: Conrad Navassa, MD;  Location: [facility] OR;  Service: Vascular;  Laterality: Left;  Carotid artery patch angioplasty using Vascu-Guard bovine patch 1cm x 6cm.  Marland Kitchen PERIPHERAL VASCULAR INTERVENTION Bilateral 11/21/2017   Procedure: PERIPHERAL VASCULAR INTERVENTION;  Surgeon: Waynetta Sandy, MD;  Location: [place] CV LAB;  Service: Cardiovascular;  Laterality: Bilateral;  Iliac Stents  . RENAL ARTERY STENT Right 05/2006    Allergies  Allergen Reactions  . Chlorhexidine Gluconate Itching  . Ace Inhibitors Cough  . Dilaudid [Hydromorphone Hcl] Itching    Prior to Admission medications   Medication Sig Start Date End Date Taking? Authorizing Provider  acetaminophen (TYLENOL) 500 MG tablet Take 1,000 mg by mouth every 6 (six) hours as needed for mild pain or headache.     [provider]  aspirin EC 81 MG tablet Take 81 mg by mouth daily.    [provider]  carvedilol (COREG) 6.25 MG tablet Take 1 tablet (6.25 mg total) by mouth 2 (two) times daily with a meal. 07/12/17   Rai, Ripudeep K, MD  cinacalcet (SENSIPAR) 30 MG tablet Take 30 mg by mouth daily. 10/21/17   [provider]  clopidogrel (PLAVIX) 75 MG tablet Take 1 tablet (75 mg total) by mouth daily. 07/12/17   Rai, Vernelle Emerald, MD  ezetimibe (ZETIA) 10 MG tablet Take 0.5 tablets (5 mg total) by mouth daily. 10/14/17 01/12/18  Evans Lance, MD  furosemide (LASIX) 80 MG tablet TAKE 1 TABLET BY MOUTH TWICE DAILY 09/16/17   Burnell Blanks, MD  glipiZIDE (GLUCOTROL) 5  MG tablet Take 2.5 mg by mouth daily before breakfast.     [provider]  hydrALAZINE (APRESOLINE) 25 MG tablet Take 1 tablet (25 mg total) by mouth 3 (three) times daily. 07/12/17   Rai, Vernelle Emerald, MD  isosorbide mononitrate (IMDUR) 30 MG 24 hr tablet Take 30 mg by mouth 2 (two) times daily.    [provider]  isosorbide mononitrate (IMDUR) 60 MG 24 hr tablet Take 1 tablet (60 mg total) by mouth daily. 07/12/17   Rai, Vernelle Emerald, MD  nitroGLYCERIN (NITROSTAT) 0.4 MG SL tablet Place 1 tablet (0.4 mg total) under the tongue every 5 (five) minutes as needed for chest pain. 03/02/16   Burnell Blanks, MD  polyethylene glycol (MIRALAX / Floria Raveling) packet Take 17 g by mouth daily as needed for mild constipation or moderate constipation. 07/12/17   Rai, Vernelle Emerald, MD  rosuvastatin (CRESTOR) 20 MG tablet Take 1 tablet (20 mg total) by mouth daily. 08/05/17   Supple, Harlon [place], RPH  Social History   Socioeconomic History  . Marital status: Divorced    Spouse name: Not on file  . Number of children: 1  . Years of education: 10th  . Highest education level: Not on file  Occupational History  . Occupation: Retired-truck Education administrator: RETIRED  Social Needs  . Financial resource strain: Not on file  . Food insecurity:    Worry: Not on file    Inability: Not on file  . Transportation needs:    Medical: Not on file    Non-medical: Not on file  Tobacco Use  . Smoking status: Former Smoker    Packs/day: 0.50    Years: 53.00    Pack years: 26.50    Types: Cigarettes    Last attempt to quit: 03/2016    Years since quitting: 1.7  . Smokeless tobacco: Never Used  Substance and Sexual Activity  . Alcohol use: No    Alcohol/week: 0.0 standard drinks  . Drug use: No  . Sexual activity: Not on file  Lifestyle  . Physical activity:    Days per week: Not on file    Minutes per session: Not on file  . Stress: Not on file  Relationships  . Social connections:     Talks on phone: Not on file    Gets together: Not on file    Attends religious service: Not on file    Active member of club or organization: Not on file    Attends meetings of clubs or organizations: Not on file    Relationship status: Not on file  . Intimate partner violence:    Fear of current or ex partner: Not on file    Emotionally abused: Not on file    Physically abused: Not on file    Forced sexual activity: Not on file  Other Topics Concern  . Not on file  Social History Narrative  . Not on file     Family History  Problem Relation Age of Onset  . Cancer Mother        ? type  . Cancer Father        ? type  . Diabetes Sister   . Hypertension Sister   . Diabetes Brother   . Hypertension Brother   . CAD Neg Hx     ROS: [x]  Positive   [ ]  Negative   [ ]  All sytems reviewed and are negative  Cardiovascular: []  chest pain/pressure []  palpitations []  SOB lying flat []  DOE [x]  pain in legs while walking [x]  pain in legs at rest [x]  pain in legs at night []  non-healing ulcers []  hx of DVT []  swelling in legs  Pulmonary: []  productive cough []  asthma/wheezing []  home O2  Neurologic: []  weakness in []  arms []  legs []  numbness in []  arms []  legs []  hx of CVA []  mini stroke [] difficulty speaking or slurred speech []  temporary loss of vision in one eye []  dizziness  Hematologic: []  hx of cancer []  bleeding problems []  problems with blood clotting easily  Endocrine:   []  diabetes []  thyroid disease  GI []  vomiting blood []  blood in stool  GU: []  CKD/renal failure []  HD--[]  M/W/F or []  T/T/S []  burning with urination []  blood in urine  Psychiatric: []  anxiety []  depression  Musculoskeletal: []  arthritis []  joint pain  Integumentary: []  rashes []  ulcers  Constitutional: []  fever []  chills   Physical Examination  There were no vitals filed for this visit. There  is no height or weight on file to calculate BMI.  Physical Exam    Constitutional: She appears well-developed.  HENT:  Head: Normocephalic.  Eyes: Pupils are equal, round, and reactive to light.  Neck: Normal range of motion.  Cardiovascular:  Pulses:      Femoral pulses are 0 on the right side, and 1+ on the left side. No discernible signals in right foot There are monophasic DP and PT signals on the left  Pulmonary/Chest: Effort normal.  Abdominal: Soft. She exhibits no mass.  Musculoskeletal:  Right foot cool to touch  Neurological: She is alert.  Psychiatric: She has a normal mood and affect. Her behavior is normal. Thought content normal.   CBC    Component Value Date/Time   WBC 4.0 07/12/2017 0754   RBC 3.00 (L) 07/12/2017 0754   HGB 10.5 (L) 11/21/2017 1232   HGB 10.9 (L) 10/26/2016 1200   HGB 10.7 (L) 02/24/2016 1505   HCT 31.0 (L) 11/21/2017 1232   HCT 32.4 (L) 10/26/2016 1200   HCT 32.8 (L) 02/24/2016 1505   PLT 103 (L) 07/12/2017 0754   PLT 117 (L) 10/26/2016 1200   MCV 93.3 07/12/2017 0754   MCV 87 10/26/2016 1200   MCV 89.1 02/24/2016 1505   MCH 29.0 07/12/2017 0754   MCHC 31.1 07/12/2017 0754   RDW 15.6 (H) 07/12/2017 0754   RDW 13.3 10/26/2016 1200   RDW 14.7 (H) 02/24/2016 1505   LYMPHSABS 0.6 (L) 07/09/2017 0324   LYMPHSABS 0.7 (L) 02/24/2016 1505   MONOABS 0.6 07/09/2017 0324   MONOABS 0.6 02/24/2016 1505   EOSABS 0.0 07/09/2017 0324   EOSABS 0.0 02/24/2016 1505   BASOSABS 0.0 07/09/2017 0324   BASOSABS 0.0 02/24/2016 1505    BMET    Component Value Date/Time   NA 137 11/21/2017 1232   NA 141 10/05/2017 0846   K 2.6 (LL) 11/21/2017 1232   CL 103 11/21/2017 1232   CO2 25 10/05/2017 0846   GLUCOSE 125 (H) 11/21/2017 1232   BUN 19 11/21/2017 1232   BUN 23 10/05/2017 0846   CREATININE 3.10 (H) 11/21/2017 1232   CREATININE 0.82 11/05/2011 1208   CALCIUM 9.3 10/05/2017 0846   GFRNONAA 20 (L) 10/05/2017 0846   GFRAA 23 (L) 10/05/2017 0846    COAGS: Lab Results  Component Value Date   INR 1.00  02/02/2016   INR 0.99 02/08/2012   INR 0.80 12/27/2011     Non-Invasive Vascular Imaging:    None   ASSESSMENT/PLAN: This is a 71 y.o. female with multiple medical comorbidities that presents from clinic with acute on chronic limb ischemia of her right lower extremity after iliac stenting earlier this week.  Her right foot is much cooler than her left and she has severe numbness with no appreciable signals. No appreciable right femoral pulse.  After review of imaging we will plan for right femoral endarterectomy with profundoplasty as well as retrograde iliac thrombectomy to try and open her stents that are presumably thrombosed.  Discussed this with patient and her family.    Marty Heck, MD [facility] [place] Office: [phone] Pager: [phone]

## 2017-11-25 NOTE — Op Note (Addendum)
Date: November 25, 2017  Preoperative diagnosis: Acute on chronic limb ischemia of the right lower extremity  Postoperative diagnosis: Same  Procedure: 1.  Right iliac thromboembolectomy 2.  Right common femoral endarterectomy with profundoplasty and bovine pericardial patch  Surgeon: Dr. Marty Heck, MD  Assistant: Arlee Muslim, PA  Indications: Patient is a 72 year old female who was seen in clinic today with numbness and a cool right foot.  Her presentation was suggestive of acute on chronic limb ischemia given that she underwent bilateral common femoral artery access on Monday with retrograde iliac stenting.  She had no appreciable right femoral pulse and no signals in her right foot.  She presents today after risks and benefits were discussed for attempt at limb salvage.  Findings: The right common femoral artery was occluded with heavily diseased artery and acute thrombus.  After iliac thrombectomy we retrieved multiple plugs of thrombus and then had brisk arterial inflow.  We carried our endarterectomy onto the takeoff of the profunda and had brisk backbleeding from the profunda itself.  At the completion of the case the patient had a dorsalis pedis signal in the right foot.  The common femoral artery was very friable and started to fall apart at the end of the case.  We were able to repair this with multiple pledgets.   Details: The patient was taken to the operating room after informed consent was obtained.  She was placed on operating table in supine position.  Her bilateral groins and right leg were prepped and draped in usual sterile fashion.  We initially made a longitudinal incision over her right common femoral artery that was pulseless.  Ultimately we dissected out the common femoral as well as the SFA and the profunda.  The artery was heavily calcified and diseased.  I put a vessel loop around the distal external iliac artery and dissected under the inguinal ligament for  more proximal control.  I initially opened the artery in longitudinal fashion with an 11 blade scalpel and had no inflow.  The artery was heavily diseased and there was thrombus in the remaining lumen of the artery.  After multiple passes of #3 and #4 Fogarty catheters we retrieved multiple plugs of fresh thrombus and then had brisk pulsatile bleeding.  At that point in time I placed a Henley clamp under the inguinal ligament for proximal control.  We used the penfield and performed a endarterectomy of the common femoral and I truncated the plaque proximal proximally in the distal external iliac and then carried the endarterectomy onto the takeoff of the profunda.  Given that the profunda was also heavily diseased I elected to open the profunda and carry my patch onto the profunda.  The endarterectomy shelf was then tacked down with 7-0 Prolenes in the profunda in interrupted fashion.  I then brought a bovine pericardial patch on the field and trimmed this accordingly and this was sewn in place in running fashion with a 5-0 Prolene.  I did have to place one pledget in the toe of the patch on the profunda itself.  It should be noted that the common femoral artery was very friable and heavily diseased.  While we were getting hemostasis in the groin it was noted that there was a hole in the side of the common femoral artery.  I attempted to place a 6-0 Prolene with a pledget and this did not make any improvement.  As a result I took a 5-0 Prolene and took a larger bite  in the wall of the artery that was carried onto the patch and then two large pledgets were placed with significant improvement in hemostasis.  I was concerned any further sutures in the artery and it would completely fall apart.  As a result we placed thrombin Gelfoam over top of the artery and the patch to get added hemostasis.  There was a triphasic profunda signal at this point in time.  Patient had a dorsalis pedis signal in the foot.  The wound was  then irrigated with saline.  We closed in multiple layers using 2-0 3- 0 Vicryl and 4-0 Monocryl and the skin.  She is taken to PACU in stable condition.  Condition: Stable  Marty Heck, MD [facility] Office: [phone] Pager: [place]

## 2017-11-26 ENCOUNTER — Inpatient Hospital Stay (HOSPITAL_COMMUNITY): Payer: Medicare HMO

## 2017-11-26 DIAGNOSIS — I739 Peripheral vascular disease, unspecified: Secondary | ICD-10-CM

## 2017-11-26 DIAGNOSIS — I70223 Atherosclerosis of native arteries of extremities with rest pain, bilateral legs: Secondary | ICD-10-CM

## 2017-11-26 LAB — BASIC METABOLIC PANEL
ANION GAP: 10 (ref 5–15)
BUN: 22 mg/dL (ref 8–23)
CHLORIDE: 108 mmol/L (ref 98–111)
CO2: 21 mmol/L — ABNORMAL LOW (ref 22–32)
Calcium: 7.6 mg/dL — ABNORMAL LOW (ref 8.9–10.3)
Creatinine, Ser: 3.05 mg/dL — ABNORMAL HIGH (ref 0.44–1.00)
GFR calc non Af Amer: 14 mL/min — ABNORMAL LOW (ref >60)
GFR, EST AFRICAN AMERICAN: 17 mL/min — AB (ref >60)
Glucose, Bld: 131 mg/dL — ABNORMAL HIGH (ref 70–99)
POTASSIUM: 2.5 mmol/L — AB (ref 3.5–5.1)
SODIUM: 139 mmol/L (ref 135–145)

## 2017-11-26 LAB — CBC
HCT: 29.7 % — ABNORMAL LOW (ref 36.0–46.0)
HCT: 28.5 % — ABNORMAL LOW (ref 36.0–46.0)
HEMOGLOBIN: 8.7 g/dL — AB (ref 12.0–15.0)
Hemoglobin: 8.1 g/dL — ABNORMAL LOW (ref 12.0–15.0)
MCH: 27.4 pg (ref 26.0–34.0)
MCH: 26.6 pg (ref 26.0–34.0)
MCHC: 29.3 g/dL — ABNORMAL LOW (ref 30.0–36.0)
MCHC: 28.4 g/dL — ABNORMAL LOW (ref 30.0–36.0)
MCV: 93.4 fL (ref 80.0–100.0)
MCV: 93.8 fL (ref 80.0–100.0)
Platelets: 90 10*3/uL — ABNORMAL LOW (ref 150–400)
Platelets: 97 10*3/uL — ABNORMAL LOW (ref 150–400)
RBC: 3.18 MIL/uL — AB (ref 3.87–5.11)
RBC: 3.04 MIL/uL — ABNORMAL LOW (ref 3.87–5.11)
RDW: 16.2 % — ABNORMAL HIGH (ref 11.5–15.5)
RDW: 16.4 % — AB (ref 11.5–15.5)
WBC: 6.9 10*3/uL (ref 4.0–10.5)
WBC: 9.2 10*3/uL (ref 4.0–10.5)
nRBC: 0 % (ref 0.0–0.2)
nRBC: 0 % (ref 0.0–0.2)

## 2017-11-26 LAB — GLUCOSE, CAPILLARY
GLUCOSE-CAPILLARY: 116 mg/dL — AB (ref 70–99)
GLUCOSE-CAPILLARY: 125 mg/dL — AB (ref 70–99)
GLUCOSE-CAPILLARY: 172 mg/dL — AB (ref 70–99)
Glucose-Capillary: 119 mg/dL — ABNORMAL HIGH (ref 70–99)

## 2017-11-26 MED ORDER — SODIUM CHLORIDE 0.9 % IV BOLUS
500.0000 mL | Freq: Once | INTRAVENOUS | Status: AC
  Administered 2017-11-26: 500 mL via INTRAVENOUS

## 2017-11-26 MED ORDER — POTASSIUM CHLORIDE CRYS ER 20 MEQ PO TBCR
40.0000 meq | EXTENDED_RELEASE_TABLET | Freq: Once | ORAL | Status: AC
  Administered 2017-11-26: 40 meq via ORAL
  Filled 2017-11-26: qty 2

## 2017-11-26 MED ORDER — HEPARIN 1000 UNIT/ML FOR PERITONEAL DIALYSIS: 500.0000 [IU] | INTRAMUSCULAR | Status: DC | PRN

## 2017-11-26 MED ORDER — CINACALCET HCL 30 MG PO TABS
60.0000 mg | ORAL_TABLET | Freq: Every day | ORAL | Status: DC
  Administered 2017-11-26 – 2017-11-27 (×2): 60 mg via ORAL
  Filled 2017-11-26 (×2): qty 2

## 2017-11-26 MED ORDER — HEPARIN 1000 UNIT/ML FOR PERITONEAL DIALYSIS
INTRAPERITONEAL | Status: DC | PRN
  Filled 2017-11-26: qty 5000

## 2017-11-26 MED ORDER — DELFLEX-LC/1.5% DEXTROSE 344 MOSM/L IP SOLN: INTRAPERITONEAL | Status: DC

## 2017-11-26 MED ORDER — GENTAMICIN SULFATE 0.1 % EX CREA
1.0000 "application " | TOPICAL_CREAM | Freq: Every day | CUTANEOUS | Status: DC
  Administered 2017-11-27 – 2017-11-28 (×3): 1 via TOPICAL
  Filled 2017-11-26: qty 15

## 2017-11-26 MED ORDER — DEXTROSE 5 % IV SOLN
500.0000 mg | Freq: Two times a day (BID) | INTRAVENOUS | Status: AC
  Administered 2017-11-26: 500 mg via INTRAVENOUS
  Filled 2017-11-26: qty 5

## 2017-11-26 NOTE — Progress Notes (Signed)
VASCULAR LAB PRELIMINARY  ARTERIAL  ABI completed:    RIGHT    LEFT    PRESSURE WAVEFORM  PRESSURE WAVEFORM  BRACHIAL 105 Triphasic BRACHIAL 95 Triphasic  DP 44 Monophasic DP 52 Monophasic  PT 48 Monophasic PT 51 Monophasic  GREAT TOE 96 NA GREAT TOE 132 NA    RIGHT LEFT  ABI/TBI 0.46/0.91 0.50/1.26   Right ABIs indicate a severe reduction in arterial flow ay rest. TBI appears normal but may be falsely elevated. Left ABI indicate a moderate to severe reduction in arterial flow at rest. TBI appears normal but may be falsely elevated.  Kristen Bradley, RVS 11/26/2017, 12:08 PM

## 2017-11-26 NOTE — Progress Notes (Signed)
CRITICAL VALUE ALERT  Critical Value:  Potassium 2.5  Date & Time Notied:  11/26/17 0620 AM  Provider Notified: Yes   Orders Received/Actions taken: Yes

## 2017-11-26 NOTE — Consult Note (Addendum)
[facility] Renal Consultation Note    Indication for Consultation:  Management of ESRD/hemodialysis; anemia, hypertension/volume and secondary hyperparathyroidism  CMK:[id number], [name], MD  HPI: Kristen Bradley is a 72 y.o. female. ESRD thought to be secondary to cardiorenal syndrome.  Followed by Dr. Justin Mend, started on PD 08/29/2017.  Past medical history significant for CAD, systolic CHF, DMT2, Hx adrenal tumor , cirrhosis, Hx Hep C, and severe PAD.    Patient seen in office by Dr. Donzetta Matters for RLE numbness and coolness in R foot following b/l common iliac artery stenting and found to have critical limb ischemia.  Sent to [facility] for admission for revascularization procedure.     Seen and examined at bedside.  States numbness in R foot has been going on for a while.  Reports mild improvement in numbness post procedure.  Denies CP, SOB, n/v, abdominal pain, fever, chills, dizziness, weakness and fatigue.  Of notes states PD has been going well. Denies complications. Pt is doing well, just says leg is numb. Did not do PD last night as she was in surgery - had low BP this PM- needed IVF   Past Medical History:  Diagnosis Date  . 3-vessel CAD - treated medically  02/26/2016  . Acute renal failure superimposed on stage 3 chronic kidney disease ([place])   . Adrenal tumor 08/2007   surgery  . Aftercare following surgery of the circulatory system, [place] 05/26/2012  . AKI (acute kidney injury) ([place])   . Atherosclerosis of native artery of extremity with intermittent claudication ([place]) 01/26/2013  . CAD, multiple vessel   . Cancer of right breast [facility])    s/p lumpectomy, XRT  . Cardiomyopathy, ischemic   . Carotid artery disease (HCC)    s/p bilateral CEA  . Carotid stenosis, bilateral-s/p Bilateral CEA 11/05/2011  . Chronic ITP (idiopathic thrombocytopenia) (HCC) 02/24/2016  . Chronic systolic (congestive) heart failure ([place]) 11/05/2016  . Chronic systolic heart failure ([place]) 03/03/2016  . CKD  (chronic kidney disease) stage 4, GFR 15-29 ml/min (HCC)   . Dyspnea   . Elevated brain natriuretic peptide (BNP) level 01/30/2016  . Elevated troponin I level 01/30/2016  . Hepatitis C    "got treatment for it" (01/29/2016)  . HFrEF (heart failure with reduced ejection fraction) ([place])   . High cholesterol    takes Lopid daily  . History of UTI    takes Diflucan dail--pt states no uti in a couple of yrs though  . Hypertension    takes Losartan,Amlodipine,HCTZ,and Atenolol daily and Clatarpres  . Intermittent claudication ([place]) 11/05/2011  . Ischemic cardiomyopathy    a. 11/2016 s/p MDT BiV ICD w/ his bundle pacing.  . Joint pain    legs  . LBBB- new 01/30/2016  . NSTEMI (non-ST elevated myocardial infarction) ([place])   . Occlusion and stenosis of carotid artery without mention of cerebral infarction 12/17/2011  . PAD (peripheral artery disease) ([place]) 11/26/2011  . Pain in limb 09/08/2012  . Peripheral vascular disease ([place]) 11/05/2011  . Renal artery stenosis (HCC)    Right renal artery stent 05/31/06  . Renal failure syndrome   . Sinus bradycardia   . Sinus pause   . Stroke [facility]) Jan. 7, 2014  . Thrombocytopenia- plts 81K 01/30/2016  . Tobacco abuse   . Type 2 diabetes mellitus with vascular disease ([place])   . Type II diabetes mellitus ([place])    Past Surgical History:  Procedure Laterality Date  . ABDOMINAL AORTOGRAM W/LOWER EXTREMITY N/A 11/21/2017  Procedure: ABDOMINAL AORTOGRAM W/LOWER EXTREMITY;  Surgeon: Waynetta Sandy, MD;  Location: [place] CV [facility];  Service: Cardiovascular;  Laterality: N/A;  . ABDOMINAL HYSTERECTOMY    . ADRENAL GLAND SURGERY  2009  . BIV ICD INSERTION CRT-D N/A 11/05/2016   Procedure: BIV ICD INSERTION CRT-D;  Surgeon: Evans Lance, MD;  Location: [place] [facility];  Service: Cardiovascular;  Laterality: N/A;  . BREAST LUMPECTOMY Right 2009  . CARDIAC CATHETERIZATION N/A 02/03/2016   Procedure: Right/Left Heart Cath and Coronary  Angiography;  Surgeon: Burnell Blanks, MD;  Location: [place] [facility];  Service: Cardiovascular;  Laterality: N/A;  . ENDARTERECTOMY  01/05/2012   Procedure: ENDARTERECTOMY CAROTID;  Surgeon: Conrad Kingsley, MD;  Location: [place];  Service: Vascular;  Laterality: Right;  Right Carotid Artery Endarterectomy, right stump pressure, intraoperative ultrasound  . ENDARTERECTOMY  02/08/2012   Procedure: ENDARTERECTOMY CAROTID;  Surgeon: Conrad [place], MD;  Location: [place];  Service: Vascular;  Laterality: Left;  . LUMBAR DISC SURGERY    . PATCH ANGIOPLASTY  02/08/2012   Procedure: PATCH ANGIOPLASTY;  Surgeon: Conrad Napa, MD;  Location: [facility] OR;  Service: Vascular;  Laterality: Left;  Carotid artery patch angioplasty using Vascu-Guard bovine patch 1cm x 6cm.  Marland Kitchen PERIPHERAL VASCULAR INTERVENTION Bilateral 11/21/2017   Procedure: PERIPHERAL VASCULAR INTERVENTION;  Surgeon: Waynetta Sandy, MD;  Location: [place] [facility];  Service: Cardiovascular;  Laterality: Bilateral;  Iliac Stents  . RENAL ARTERY STENT Right 05/2006   Family History  Problem Relation Age of Onset  . Cancer Mother        ? type  . Cancer Father        ? type  . Diabetes Sister   . Hypertension Sister   . Diabetes Brother   . Hypertension Brother   . CAD Neg Hx    Social History:  reports that she quit smoking about 20 months ago. Her smoking use included cigarettes. She has a 26.50 pack-year smoking history. She has never used smokeless tobacco. She reports that she does not drink alcohol or use drugs. Allergies  Allergen Reactions  . Chlorhexidine Gluconate Itching  . Ace Inhibitors Cough  . Dilaudid [Hydromorphone Hcl] Itching   Prior to Admission medications   Medication Sig Start Date End Date Taking? Authorizing Provider  acetaminophen (TYLENOL) 500 MG tablet Take 1,000 mg by mouth every 6 (six) hours as needed for mild pain or headache.    Yes [provider]  aspirin EC 81 MG tablet Take 162  mg by mouth once.    Yes [provider]  carvedilol (COREG) 6.25 MG tablet Take 1 tablet (6.25 mg total) by mouth 2 (two) times daily with a meal. 07/12/17  Yes Rai, Ripudeep K, MD  cinacalcet (SENSIPAR) 60 MG tablet Take 60 mg by mouth daily. 11/21/17  Yes [provider]  clopidogrel (PLAVIX) 75 MG tablet Take 1 tablet (75 mg total) by mouth daily. 07/12/17  Yes Rai, Ripudeep K, MD  ezetimibe (ZETIA) 10 MG tablet Take 0.5 tablets (5 mg total) by mouth daily. 10/14/17 01/12/18 Yes Evans Lance, MD  furosemide (LASIX) 80 MG tablet TAKE 1 TABLET BY MOUTH TWICE DAILY Patient taking differently: Take 80 mg by mouth daily.  09/16/17  Yes Burnell Blanks, MD  glipiZIDE (GLUCOTROL) 5 MG tablet Take 5 mg by mouth daily before breakfast.    Yes [provider]  hydrALAZINE (APRESOLINE) 25 MG tablet Take 1 tablet (25  mg total) by mouth 3 (three) times daily. Patient taking differently: Take 25 mg by mouth daily.  07/12/17  Yes Rai, Ripudeep K, MD  isosorbide mononitrate (IMDUR) 60 MG 24 hr tablet Take 1 tablet (60 mg total) by mouth daily. 07/12/17  Yes Rai, Ripudeep K, MD  nitroGLYCERIN (NITROSTAT) 0.4 MG SL tablet Place 1 tablet (0.4 mg total) under the tongue every 5 (five) minutes as needed for chest pain. 03/02/16  Yes Burnell Blanks, MD  rosuvastatin (CRESTOR) 20 MG tablet Take 1 tablet (20 mg total) by mouth daily. 08/05/17  Yes Supple, Megan E, RPH  polyethylene glycol (MIRALAX / GLYCOLAX) packet Take 17 g by mouth daily as needed for mild constipation or moderate constipation. Patient not taking: Reported on 11/25/2017 07/12/17   Mendel Corning, MD   Current Facility-Administered Medications  Medication Dose Route Frequency Provider Last Rate Last Dose  . 0.9 %  sodium chloride infusion  500 mL Intravenous Once PRN Dagoberto Ligas, PA-C      . acetaminophen (TYLENOL) tablet 325-650 mg  325-650 mg Oral Q4H PRN Dagoberto Ligas, PA-C       Or  .  acetaminophen (TYLENOL) suppository 325-650 mg  325-650 mg Rectal Q4H PRN Dagoberto Ligas, PA-C      . alum & mag hydroxide-simeth (MAALOX/MYLANTA) 200-200-20 MG/5ML suspension 15-30 mL  15-30 mL Oral Q2H PRN Dagoberto Ligas, PA-C      . aspirin EC tablet 162 mg  162 mg Oral Once Dagoberto Ligas, PA-C      . carvedilol (COREG) tablet 6.25 mg  6.25 mg Oral BID WC Dagoberto Ligas, PA-C   6.25 mg at 11/26/17 0900  . ceFAZolin (ANCEF) 500 mg in dextrose 5 % 100 mL IVPB  500 mg Intravenous Q12H Marty Heck, MD      . clopidogrel (PLAVIX) tablet 75 mg  75 mg Oral Daily Dagoberto Ligas, PA-C   75 mg at 11/26/17 0925  . docusate sodium (COLACE) capsule 100 mg  100 mg Oral Daily Dagoberto Ligas, PA-C      . ezetimibe (ZETIA) tablet 5 mg  5 mg Oral Daily Dagoberto Ligas, PA-C   5 mg at 11/26/17 3354  . furosemide (LASIX) tablet 80 mg  80 mg Oral Daily Dagoberto Ligas, PA-C   80 mg at 11/26/17 5625  . guaiFENesin-dextromethorphan (ROBITUSSIN DM) 100-10 MG/5ML syrup 15 mL  15 mL Oral Q4H PRN Dagoberto Ligas, PA-C      . heparin injection 5,000 Units  5,000 Units Subcutaneous Q8H Dagoberto Ligas, PA-C   5,000 Units at 11/26/17 6389  . hydrALAZINE (APRESOLINE) injection 5 mg  5 mg Intravenous Q20 Min PRN Dagoberto Ligas, PA-C      . hydrALAZINE (APRESOLINE) tablet 25 mg  25 mg Oral Daily Dagoberto Ligas, PA-C   25 mg at 11/26/17 3734  . insulin aspart (novoLOG) injection 0-15 Units  0-15 Units Subcutaneous TID WC Dagoberto Ligas, PA-C      . isosorbide mononitrate (IMDUR) 24 hr tablet 60 mg  60 mg Oral Daily Dagoberto Ligas, PA-C   60 mg at 11/26/17 0925  . labetalol (NORMODYNE,TRANDATE) injection 10 mg  10 mg Intravenous Q10 min PRN Dagoberto Ligas, PA-C      . magnesium sulfate IVPB 2 g 50 mL  2 g Intravenous Daily PRN Dagoberto Ligas, PA-C      . metoprolol tartrate (LOPRESSOR) injection 2-5 mg  2-5 mg Intravenous Q2H PRN Dagoberto Ligas, PA-C      . morphine 2 MG/ML injection  2 mg   2 mg Intravenous Q2H PRN Dagoberto Ligas, PA-C      . nitroGLYCERIN (NITROSTAT) SL tablet 0.4 mg  0.4 mg Sublingual Q5 min PRN Dagoberto Ligas, PA-C      . ondansetron [facility]) injection 4 mg  4 mg Intravenous Q6H PRN Dagoberto Ligas, PA-C      . oxyCODONE-acetaminophen (PERCOCET/ROXICET) 5-325 MG per tablet 1-2 tablet  1-2 tablet Oral Q4H PRN Dagoberto Ligas, PA-C   2 tablet at 11/26/17 7846  . pantoprazole (PROTONIX) EC tablet 40 mg  40 mg Oral Daily Dagoberto Ligas, PA-C   40 mg at 11/26/17 9629  . phenol (CHLORASEPTIC) mouth spray 1 spray  1 spray Mouth/Throat PRN Dagoberto Ligas, PA-C      . potassium chloride SA (K-DUR,KLOR-CON) CR tablet 20-40 mEq  20-40 mEq Oral Daily PRN Dagoberto Ligas, PA-C      . rosuvastatin (CRESTOR) tablet 20 mg  20 mg Oral Daily Dagoberto Ligas, PA-C   20 mg at 11/26/17 5284   Labs: Basic Metabolic Panel: Recent Labs  Lab 11/21/17 1232 11/25/17 1714 11/25/17 2206 11/26/17 0534  NA 137 139  --  139  K 2.6* 2.5*  --  2.5*  CL 103  --   --  108  CO2  --   --   --  21*  GLUCOSE 125* 117*  --  131*  BUN 19  --   --  22  CREATININE 3.10*  --  2.94* 3.05*  CALCIUM  --   --   --  7.6*   CBC: Recent Labs  Lab 11/25/17 1714 11/25/17 2206 11/26/17 0534  WBC  --  6.6 6.9  HGB 10.5* 9.1* 8.7*  HCT 31.0* 30.5* 29.7*  MCV  --  93.6 93.4  PLT  --  87* 90*   CBG: Recent Labs  Lab 11/21/17 1155 11/21/17 1709 11/25/17 2037 11/26/17 0803  GLUCAP 109* 85 121* 119*   Studies/Results: No results found.  ROS: All others negative except those listed in HPI.  Physical Exam: Vitals:   11/25/17 2200 11/26/17 0017 11/26/17 0417 11/26/17 0841  BP:  (!) 125/56  (!) 110/57  Pulse:    77  Resp:    15  Temp: 97.6 F (36.4 C) 97.9 F (36.6 C) (!) 97.5 F (36.4 C) 97.8 F (36.6 C)  TempSrc: Oral Oral Oral Oral  SpO2:    95%     General: NAD, chronically ill appearing female Head: NCAT sclera not icteric MMM Neck: Supple. No  lymphadenopathy Lungs: CTA bilaterally. No wheeze, rales or rhonchi. Breathing is unlabored. Heart: RRR. +1/3 systolic murmur, no rubs or gallops.  Abdomen: soft, nontender, +BS, no guarding, no rebound tenderness PD cath dressed in LLQ M/S:  Equal strength b/l in upper and lower extremities.  Lower extremities:no edema, ischemic changes, or open wounds  Neuro: AAOx3. Moves all extremities spontaneously. Psych:  Responds to questions appropriately with a normal affect. Dialysis Access: PD cath in LLQ  Dialysis Orders:  CCPD 7d wk  EDW 65.5kg  2.5Ca 0.5Mg  4 exchanges, fill vol 1734mL, last fill vol 1070mL, No day exchange or day fill vol mircera 11mcg given on 10/7  Assessment/Plan: 1.  critical limb ischemia - s/p R femoral endarterectomy & iliac thrombectomy - some improvement post procedure - per VVS 2.  Hypokalemia - K 2.5. Given K supplements. Follow trends. No K restriction in diet 3.  ESRD -  On PD - will write orders for PD tonight. Does 4 exchanges-  then dry during day- will use all 1.5% 4.  Hypertension/volume  - BP variable. On home meds - lasix & coreg.  Does not appear volume overloaded on exam.  Will stop hydralazine as is low dose and BP is low at present- all 1.5 % fluid  5.  Anemia of CKD - Hgb 9.1>8.7.  Follow trends post procedure.   6.  Secondary Hyperparathyroidism -  OP ca and phos in goal. Not on binders. Continue sensipar.  7.  Nutrition - Can liberalize diet due to low K. Follow labs 8. DMT2 - per primary 9. CAD - per primary 10. Hx Hep C/cirrhosis - per primary  Jen Mow, PA-C [facility] Pager: [phone] 11/26/2017, 12:36 PM   Patient seen and examined, agree with above note with above modifications. Pt on PD for around 3 mos- going well.  Presented with critical limb ischemia s/p revascularization- we are asked to provide PD- will do regular orders for tonight- stop hydral and all 1.5% fluid for lowish BP- liberralize diet as phos  and k are low- also K dur given  Corliss Parish, MD 11/26/2017

## 2017-11-26 NOTE — Progress Notes (Signed)
Pt's bp was 88/51(62) at 1420.  Notified Dr. Monica Martinez, MD.  Verbal orders received for 500 ml NS bolus and STAT CBC.  Nephrology PA [facility]) also made aware.

## 2017-11-26 NOTE — Progress Notes (Signed)
[facility] [place]  Subjective  - Doing well POD#1 s/p R femoral endarterectomy and iliac thrombectomy.  R DP signal.  Foot feels better.    Objective (!) 125/56 70 (!) 97.5 F (36.4 C) (Oral) 17 98%  Intake/Output Summary (Last 24 hours) at 11/26/2017 0810 Last data filed at 11/26/2017 0545 Gross per 24 hour  Intake 1600 ml  Output 330 ml  Net 1270 ml    General: NAD Right groin no hematoma, palpable femoral pulse Brisk DP signal in right foot    Laboratory Lab Results: Recent Labs    11/25/17 2206 11/26/17 0534  WBC 6.6 6.9  HGB 9.1* 8.7*  HCT 30.5* 29.7*  PLT 87* 90*   BMET Recent Labs    11/25/17 1714 11/25/17 2206 11/26/17 0534  NA 139  --  139  K 2.5*  --  2.5*  CL  --   --  108  CO2  --   --  21*  GLUCOSE 117*  --  131*  BUN  --   --  22  CREATININE  --  2.94* 3.05*  CALCIUM  --   --  7.6*    COAG Lab Results  Component Value Date   INR 1.00 02/02/2016   INR 0.99 02/08/2012   INR 0.80 12/27/2011   No results found for: PTT  Assessment/Planning: Doing well POD#1 s/p right femoral endarterectomy and iliac thrombectomy.  May sit up this am.  Will call nephrology today for PD dialysis.  Great DP signal in right foot.    Marty Heck 11/26/2017 8:10 AM --

## 2017-11-26 NOTE — Progress Notes (Signed)
Foley catheter removed with assistance from [place], [place].  Patient tolerated procedure well and voided post-cath removal, 300 cc.

## 2017-11-26 NOTE — Progress Notes (Signed)
Contacted provider Dr. Carlis Abbott about pt's low bp of 88/59.  500 ml NS bolus along with STAT CBC.  Will continue to monitor.

## 2017-11-26 NOTE — Progress Notes (Signed)
Darryl, RN removed A-line at 3744 without complications.

## 2017-11-27 ENCOUNTER — Other Ambulatory Visit: Payer: Self-pay

## 2017-11-27 LAB — GLUCOSE, CAPILLARY
GLUCOSE-CAPILLARY: 113 mg/dL — AB (ref 70–99)
Glucose-Capillary: 127 mg/dL — ABNORMAL HIGH (ref 70–99)
Glucose-Capillary: 133 mg/dL — ABNORMAL HIGH (ref 70–99)
Glucose-Capillary: 131 mg/dL — ABNORMAL HIGH (ref 70–99)

## 2017-11-27 LAB — RENAL FUNCTION PANEL
Albumin: 2.1 g/dL — ABNORMAL LOW (ref 3.5–5.0)
Anion gap: 9 (ref 5–15)
BUN: 24 mg/dL — AB (ref 8–23)
CALCIUM: 7.4 mg/dL — AB (ref 8.9–10.3)
CHLORIDE: 107 mmol/L (ref 98–111)
CO2: 21 mmol/L — AB (ref 22–32)
CREATININE: 3.54 mg/dL — AB (ref 0.44–1.00)
GFR calc Af Amer: 14 mL/min — ABNORMAL LOW (ref >60)
GFR calc non Af Amer: 12 mL/min — ABNORMAL LOW (ref >60)
Glucose, Bld: 134 mg/dL — ABNORMAL HIGH (ref 70–99)
Phosphorus: 3.5 mg/dL (ref 2.5–4.6)
Potassium: 2.8 mmol/L — ABNORMAL LOW (ref 3.5–5.1)
Sodium: 137 mmol/L (ref 135–145)

## 2017-11-27 MED ORDER — DARBEPOETIN ALFA 150 MCG/0.3ML IJ SOSY
150.0000 ug | PREFILLED_SYRINGE | INTRAMUSCULAR | Status: DC
  Administered 2017-11-27: 150 ug via SUBCUTANEOUS
  Filled 2017-11-27: qty 0.3

## 2017-11-27 MED ORDER — POTASSIUM CHLORIDE CRYS ER 20 MEQ PO TBCR
40.0000 meq | EXTENDED_RELEASE_TABLET | Freq: Two times a day (BID) | ORAL | Status: DC
  Administered 2017-11-27 – 2017-11-28 (×3): 40 meq via ORAL
  Filled 2017-11-27 (×3): qty 2

## 2017-11-27 NOTE — Progress Notes (Addendum)
Kristen Bradley from [place] called.  Pt had 6 beat run of v tach, unpaced beats.  Checked on pt asymptomatic and NSR.  Notified provider Dr. Monica Martinez, MD by page.  Provider made aware.  Spoke to provider and will continue to monitor for any symptoms or arrhythmias.

## 2017-11-27 NOTE — Progress Notes (Signed)
Subjective:   PD overnight- no UF- BP soft- makes urine- K has remained low - she tells me that usually her BP is not low- has been up walking in room without dizziness- also tells me her K does not usually run low  Objective Vital signs in last 24 hours: Vitals:   11/27/17 0400 11/27/17 0423 11/27/17 0800 11/27/17 0900  BP: (!) 89/61  96/61   Pulse:  99 75 85  Resp: 17 18 17  (!) 23  Temp:  98.8 F (37.1 C) 98.6 F (37 C)   TempSrc:  Oral Oral   SpO2: 97% (!) 80% 96% 99%  Weight:       Weight change:   Intake/Output Summary (Last 24 hours) at 11/27/2017 1152 Last data filed at 11/27/2017 0800 Gross per 24 hour  Intake 240 ml  Output 650 ml  Net -410 ml    Dialysis Orders:  CCPD 7d wk  EDW 65.5kg  2.5Ca 0.5Mg  4 exchanges, fill vol 1784mL, - No day exchange or day fill vol mircera 40mcg given on 10/7  Assessment/Plan: 1.  critical limb ischemia - s/p R femoral endarterectomy & iliac thrombectomy - some improvement post procedure, still with some soreness/numbness - per VVS 2.  Hypokalemia - K 2.5. Give K supplements at higher dose today. Follow trends. No K restriction in diet but not eating much 3.  ESRD -  On PD -  Does 4 exchanges- then dry during day- will use all 1.5% 4.  Hypertension/volume  - BP variable. home meds + lasix,  6.25 coreg and hydralazine.  Does not appear volume overloaded on exam.  Will stop hydralazine as is low dose and BP is low at present- all 1.5 % fluid  5.  Anemia of CKD - Hgb 9.1>8.7- now 8.1- need to redose ESA 6.  Secondary Hyperparathyroidism -  OP ca and phos in goal. Not on binders. Continue sensipar.  7.  Nutrition - Can liberalize diet due to low K. Follow labs 8. DMT2 - per primary 9. CAD - per primary 10. Hx Hep C/cirrhosis - per primary   [place]: Basic Metabolic Panel: Recent Labs  Lab 11/21/17 1232 11/25/17 1714 11/25/17 2206 11/26/17 0534 11/27/17 0325  NA 137 139  --  139 137  K 2.6* 2.5*  --   2.5* 2.8*  CL 103  --   --  108 107  CO2  --   --   --  21* 21*  GLUCOSE 125* 117*  --  131* 134*  BUN 19  --   --  22 24*  CREATININE 3.10*  --  2.94* 3.05* 3.54*  CALCIUM  --   --   --  7.6* 7.4*  PHOS  --   --   --   --  3.5   Liver Function Tests: Recent Labs  Lab 11/27/17 0325  ALBUMIN 2.1*   No results for input(s): LIPASE, AMYLASE in the last 168 hours. No results for input(s): AMMONIA in the last 168 hours. CBC: Recent Labs  Lab 11/25/17 2206 11/26/17 0534 11/26/17 1510  WBC 6.6 6.9 9.2  HGB 9.1* 8.7* 8.1*  HCT 30.5* 29.7* 28.5*  MCV 93.6 93.4 93.8  PLT 87* 90* 97*   Cardiac Enzymes: No results for input(s): CKTOTAL, CKMB, CKMBINDEX, TROPONINI in the last 168 hours. CBG: Recent Labs  Lab 11/26/17 1248 11/26/17 1707 11/26/17 2142 11/27/17 0624 11/27/17 1129  GLUCAP 116* 125* 172* 127* 133*  Iron Studies: No results for input(s): IRON, TIBC, TRANSFERRIN, FERRITIN in the last 72 hours. Studies/Results: No results found. Medications: Infusions: . sodium chloride    . dialysis solution 1.5% low-MG/low-CA    . magnesium sulfate 1 - 4 g bolus IVPB      Scheduled Medications: . aspirin EC  162 mg Oral Once  . carvedilol  6.25 mg Oral BID WC  . cinacalcet  60 mg Oral Q supper  . clopidogrel  75 mg Oral Daily  . docusate sodium  100 mg Oral Daily  . ezetimibe  5 mg Oral Daily  . furosemide  80 mg Oral Daily  . gentamicin cream  1 application Topical Daily  . heparin  5,000 Units Subcutaneous Q8H  . insulin aspart  0-15 Units Subcutaneous TID WC  . pantoprazole  40 mg Oral Daily  . rosuvastatin  20 mg Oral Daily    have reviewed scheduled and prn medications.  Physical Exam: General: NAD Heart: RRR Lungs: mostly clear Abdomen: distended- non tender Extremities: no edema Dialysis Access: PD cath     11/27/2017,11:52 AM  LOS: 2 days

## 2017-11-27 NOTE — Anesthesia Postprocedure Evaluation (Signed)
Anesthesia Post Note  Patient: Vernis Aviva Signs  Procedure(s) Performed: RIGHT FEMORAL ENDARTERECTOMY WITH PROFUNOPLASTY AND BOVINE PATCH ANGIOPLAST RIGHT ILIOFEMORAL; RIGHT ILIAC THROMBOEMBOLECTOMY (Right ) ILIAC EMBOLECTOMY (Right )     Patient location during evaluation: PACU Anesthesia Type: General Level of consciousness: awake and alert Pain management: pain level controlled Vital Signs Assessment: post-procedure vital signs reviewed and stable Respiratory status: spontaneous breathing, nonlabored ventilation, respiratory function stable and patient connected to nasal cannula oxygen Cardiovascular status: blood pressure returned to baseline and stable Postop Assessment: no apparent nausea or vomiting Anesthetic complications: no    Last Vitals:  Vitals:   11/27/17 1500 11/27/17 1700  BP: 97/77 92/64  Pulse:  77  Resp: (!) 21 19  Temp:  37.2 C  SpO2:  100%    Last Pain:  Vitals:   11/27/17 1700  TempSrc: Oral  PainSc: 2                  Matthew Cina

## 2017-11-27 NOTE — Progress Notes (Addendum)
  Progress Note    11/27/2017 9:11 AM 2 Days Post-Op  Subjective:  Soreness R groin.  Numbness R foot unchanged. Rest pain R foot resolved   Vitals:   11/27/17 0423 11/27/17 0800  BP:  96/61  Pulse: 99 75  Resp: 18 17  Temp: 98.8 F (37.1 C) 98.6 F (37 C)  SpO2: (!) 80% 96%   Physical Exam: Lungs:  Non labored Incisions:  R groin incision soft, c/d/i Extremities:  R DP and PT by doppler Abdomen:  PD cath site unremarkable Neurologic: A&O  CBC    Component Value Date/Time   WBC 9.2 11/26/2017 1510   RBC 3.04 (L) 11/26/2017 1510   HGB 8.1 (L) 11/26/2017 1510   HGB 10.9 (L) 10/26/2016 1200   HGB 10.7 (L) 02/24/2016 1505   HCT 28.5 (L) 11/26/2017 1510   HCT 32.4 (L) 10/26/2016 1200   HCT 32.8 (L) 02/24/2016 1505   PLT 97 (L) 11/26/2017 1510   PLT 117 (L) 10/26/2016 1200   MCV 93.8 11/26/2017 1510   MCV 87 10/26/2016 1200   MCV 89.1 02/24/2016 1505   MCH 26.6 11/26/2017 1510   MCHC 28.4 (L) 11/26/2017 1510   RDW 16.4 (H) 11/26/2017 1510   RDW 13.3 10/26/2016 1200   RDW 14.7 (H) 02/24/2016 1505   LYMPHSABS 0.6 (L) 07/09/2017 0324   LYMPHSABS 0.7 (L) 02/24/2016 1505   MONOABS 0.6 07/09/2017 0324   MONOABS 0.6 02/24/2016 1505   EOSABS 0.0 07/09/2017 0324   EOSABS 0.0 02/24/2016 1505   BASOSABS 0.0 07/09/2017 0324   BASOSABS 0.0 02/24/2016 1505    BMET    Component Value Date/Time   NA 137 11/27/2017 0325   NA 141 10/05/2017 0846   K 2.8 (L) 11/27/2017 0325   CL 107 11/27/2017 0325   CO2 21 (L) 11/27/2017 0325   GLUCOSE 134 (H) 11/27/2017 0325   BUN 24 (H) 11/27/2017 0325   BUN 23 10/05/2017 0846   CREATININE 3.54 (H) 11/27/2017 0325   CREATININE 0.82 11/05/2011 1208   CALCIUM 7.4 (L) 11/27/2017 0325   GFRNONAA 12 (L) 11/27/2017 0325   GFRAA 14 (L) 11/27/2017 0325    INR    Component Value Date/Time   INR 1.00 02/02/2016 2050     Intake/Output Summary (Last 24 hours) at 11/27/2017 0911 Last data filed at 11/26/2017 0933 Gross per 24 hour    Intake 118 ml  Output -  Net 118 ml     Assessment/Plan:  72 y.o. female is s/p R CFA endarterectomy and iliac thrombectomy 2 Days Post-Op   Perfusing R foot well with strong PT and DP signals by doppler R groin incision unremarkable PD per Neprhology Home when mobility increased and pain controlled   Dagoberto Ligas, PA-C [facility] [phone] 11/27/2017 9:11 AM  I have seen and evaluated the patient. I agree with the PA note as documented above. Right groin looks good.  Brisk DP signal right foot.  Feels she needs one more day in hospital for pain control.  Appreciate nephrology assistance for PD dialysis.  Marty Heck, MD [facility] [place] Office: [phone] Pager: [phone]

## 2017-11-28 ENCOUNTER — Encounter (HOSPITAL_COMMUNITY): Payer: Self-pay | Admitting: Vascular Surgery

## 2017-11-28 DIAGNOSIS — N2581 Secondary hyperparathyroidism of renal origin: Secondary | ICD-10-CM | POA: Diagnosis not present

## 2017-11-28 DIAGNOSIS — N186 End stage renal disease: Secondary | ICD-10-CM | POA: Diagnosis not present

## 2017-11-28 LAB — HEPATITIS B SURFACE ANTIGEN: Hepatitis B Surface Ag: NEGATIVE

## 2017-11-28 LAB — RENAL FUNCTION PANEL
ALBUMIN: 2.2 g/dL — AB (ref 3.5–5.0)
ANION GAP: 8 (ref 5–15)
BUN: 23 mg/dL (ref 8–23)
CALCIUM: 7.7 mg/dL — AB (ref 8.9–10.3)
CO2: 22 mmol/L (ref 22–32)
Chloride: 108 mmol/L (ref 98–111)
Creatinine, Ser: 3.6 mg/dL — ABNORMAL HIGH (ref 0.44–1.00)
GFR calc Af Amer: 14 mL/min — ABNORMAL LOW (ref >60)
GFR, EST NON AFRICAN AMERICAN: 12 mL/min — AB (ref >60)
GLUCOSE: 125 mg/dL — AB (ref 70–99)
PHOSPHORUS: 2.8 mg/dL (ref 2.5–4.6)
POTASSIUM: 3.4 mmol/L — AB (ref 3.5–5.1)
Sodium: 138 mmol/L (ref 135–145)

## 2017-11-28 LAB — GLUCOSE, CAPILLARY: GLUCOSE-CAPILLARY: 115 mg/dL — AB (ref 70–99)

## 2017-11-28 MED ORDER — OXYCODONE-ACETAMINOPHEN 5-325 MG PO TABS: 1.0000 | ORAL_TABLET | Freq: Four times a day (QID) | ORAL | 0 refills | Status: DC | PRN

## 2017-11-28 MED ORDER — ASPIRIN EC 81 MG PO TBEC: 81.0000 mg | DELAYED_RELEASE_TABLET | Freq: Every day | ORAL | 0 refills | Status: AC

## 2017-11-28 MED FILL — Thrombin For Soln 5000 Unit: CUTANEOUS | Qty: 2 | Status: AC

## 2017-11-28 NOTE — Progress Notes (Addendum)
Notified Matt Eveland, [place], of patient's vital signs: temp 100.2 and low normal BP. Clarified orders for discharge. New orders received. Reviewed AVS discharge instructions with patient with son at bedside. Patient/caregiver verbalizes understanding of instructions received. AVS and prescriptions received by patient/caregiver. If present, telemetry box removed and central cardiac monitoring department notified of discharge. Peripheral IV removed, site benign with tip intact. Patient will call after finishing lunch for discharge transport.

## 2017-11-28 NOTE — Progress Notes (Signed)
  Progress Note    11/28/2017 7:34 AM 2 Days Post-Op  Subjective:  Right groin pain improving.  Foot feels better.    Vitals:   11/27/17 2000 11/28/17 0338  BP: 93/60 (!) 106/51  Pulse: 80 82  Resp: (!) 21 19  Temp: 98.9 F (37.2 C) 99.7 F (37.6 C)  SpO2: 100% 97%   Physical Exam: Lungs:  Non labored Incisions:  R groin incision soft, c/d/i Extremities:  R DP and PT by doppler Abdomen:  PD cath site unremarkable Neurologic: A&O  CBC    Component Value Date/Time   WBC 9.2 11/26/2017 1510   RBC 3.04 (L) 11/26/2017 1510   HGB 8.1 (L) 11/26/2017 1510   HGB 10.9 (L) 10/26/2016 1200   HGB 10.7 (L) 02/24/2016 1505   HCT 28.5 (L) 11/26/2017 1510   HCT 32.4 (L) 10/26/2016 1200   HCT 32.8 (L) 02/24/2016 1505   PLT 97 (L) 11/26/2017 1510   PLT 117 (L) 10/26/2016 1200   MCV 93.8 11/26/2017 1510   MCV 87 10/26/2016 1200   MCV 89.1 02/24/2016 1505   MCH 26.6 11/26/2017 1510   MCHC 28.4 (L) 11/26/2017 1510   RDW 16.4 (H) 11/26/2017 1510   RDW 13.3 10/26/2016 1200   RDW 14.7 (H) 02/24/2016 1505   LYMPHSABS 0.6 (L) 07/09/2017 0324   LYMPHSABS 0.7 (L) 02/24/2016 1505   MONOABS 0.6 07/09/2017 0324   MONOABS 0.6 02/24/2016 1505   EOSABS 0.0 07/09/2017 0324   EOSABS 0.0 02/24/2016 1505   BASOSABS 0.0 07/09/2017 0324   BASOSABS 0.0 02/24/2016 1505    BMET    Component Value Date/Time   NA 138 11/28/2017 0432   NA 141 10/05/2017 0846   K 3.4 (L) 11/28/2017 0432   CL 108 11/28/2017 0432   CO2 22 11/28/2017 0432   GLUCOSE 125 (H) 11/28/2017 0432   BUN 23 11/28/2017 0432   BUN 23 10/05/2017 0846   CREATININE 3.60 (H) 11/28/2017 0432   CREATININE 0.82 11/05/2011 1208   CALCIUM 7.7 (L) 11/28/2017 0432   GFRNONAA 12 (L) 11/28/2017 0432   GFRAA 14 (L) 11/28/2017 0432    INR    Component Value Date/Time   INR 1.00 02/02/2016 2050     Intake/Output Summary (Last 24 hours) at 11/28/2017 0734 Last data filed at 11/27/2017 1700 Gross per 24 hour  Intake 720 ml    Output 650 ml  Net 70 ml     Assessment/Plan:  72 y.o. female is s/p R CFA endarterectomy and iliac thrombectomy   Perfusing R foot well with strong PT and DP signals by doppler R groin incision unremarkable PD per Neprhology D/C home today  Marty Heck, MD [facility] [place] Office: [phone] Pager: [place]

## 2017-11-28 NOTE — Discharge Summary (Signed)
Physician Discharge Summary   Patient ID: Kristen Bradley [id number] 71 y.o. 08/03/1945  Admit date: 11/25/2017  Discharge date and time:  11/28/17  Admitting Physician: Marty Heck, MD   Discharge Physician: same  Admission Diagnoses: PAD (peripheral artery disease) [facility]) [I73.9]  Discharge Diagnoses: same  Admission Condition: poor  Discharged Condition: fair  Indication for Admission: acutely ischemic RLE  Hospital Course: [name] is a 72 year old female who was seen in the office by Dr. Donzetta Matters on 11/25/2017 and found to have an acutely ischemic right lower extremity.  Dr. Gwenlyn Saran had performed an aortogram with bilateral common iliac artery stenting on Monday 11/21/2017.  She was taken urgently to the operating room by Dr. Carlis Abbott on the evening of 11/25/2017 and underwent right common femoral artery endarterectomy and profundoplasty with thrombectomy of right iliac system.  She tolerated this procedure well and was admitted to the hospital postoperatively.  POD #1 nephrology was consulted for management of end-stage renal disease on peritoneal dialysis.  Throughout her hospital stay she states that she continues to have some numbness in her right foot however rest pain has resolved.  She is ambulating without difficulty and is feeling fit for discharge home today.  Since her surgery she has maintained brisk right DP and PT signals by Doppler.  She will continue her Plavix in addition to aspirin.  She will be prescribed 1 to 2 days of narcotic pain medication for continued postoperative pain control.  Discharge instructions were reviewed with the patient and she voices her understanding.  She will be discharged to home this morning in stable condition.  It should also be noted that nephrology also recommended patient to discontinue her daily hydralazine due to soft pressures postoperatively.  She will follow-up with her PCP for continued management of hypertension.  Consults:  nephrology  Treatments: surgery: R CFA endarterectomy and profundoplasty with thrombectomy by Dr. Carlis Abbott 11/25/17  Discharge Exam: see progress note 11/28/17 Vitals:   11/28/17 0338 11/28/17 0838  BP: (!) 106/51 106/60  Pulse: 82 82  Resp: 19 (!) 25  Temp: 99.7 F (37.6 C) 100.2 F (37.9 C)  SpO2: 97%      Disposition: Discharge disposition: 01-Home or Self Care       Patient Instructions:  Allergies as of 11/28/2017      Reactions   Chlorhexidine Gluconate Itching   Ace Inhibitors Cough   Dilaudid [hydromorphone Hcl] Itching      Medication List    STOP taking these medications   hydrALAZINE 25 MG tablet Commonly known as:  APRESOLINE     TAKE these medications   acetaminophen 500 MG tablet Commonly known as:  TYLENOL Take 1,000 mg by mouth every 6 (six) hours as needed for mild pain or headache.   aspirin EC 81 MG tablet Take 1 tablet (81 mg total) by mouth daily. What changed:    how much to take  when to take this   carvedilol 6.25 MG tablet Commonly known as:  COREG Take 1 tablet (6.25 mg total) by mouth 2 (two) times daily with a meal.   cinacalcet 60 MG tablet Commonly known as:  SENSIPAR Take 60 mg by mouth daily.   clopidogrel 75 MG tablet Commonly known as:  PLAVIX Take 1 tablet (75 mg total) by mouth daily.   ezetimibe 10 MG tablet Commonly known as:  ZETIA Take 0.5 tablets (5 mg total) by mouth daily.   furosemide 80 MG tablet Commonly known as:  LASIX TAKE 1  TABLET BY MOUTH TWICE DAILY What changed:  when to take this   glipiZIDE 5 MG tablet Commonly known as:  GLUCOTROL Take 5 mg by mouth daily before breakfast.   isosorbide mononitrate 60 MG 24 hr tablet Commonly known as:  IMDUR Take 1 tablet (60 mg total) by mouth daily.   nitroGLYCERIN 0.4 MG SL tablet Commonly known as:  NITROSTAT Place 1 tablet (0.4 mg total) under the tongue every 5 (five) minutes as needed for chest pain.   oxyCODONE-acetaminophen 5-325 MG  tablet Commonly known as:  PERCOCET/ROXICET Take 1 tablet by mouth every 6 (six) hours as needed for moderate pain.   polyethylene glycol packet Commonly known as:  MIRALAX / GLYCOLAX Take 17 g by mouth daily as needed for mild constipation or moderate constipation.   rosuvastatin 20 MG tablet Commonly known as:  CRESTOR Take 1 tablet (20 mg total) by mouth daily.      Activity: activity as tolerated Diet: regular diet Wound Care: keep wound clean and dry  Follow-up with Dr. Donzetta Matters in 2 weeks.  SignedDagoberto Ligas 11/28/2017 11:22 AM

## 2017-11-28 NOTE — Progress Notes (Signed)
11/28/2017 12:08 PM Pt discharge to home via private vehicle with son. [place]

## 2017-11-28 NOTE — Discharge Instructions (Signed)
 [facility] [place]  Discharge instructions  Lower Extremity Bypass Surgery  Please refer to the following instruction for your post-procedure care. Your surgeon or physician assistant will discuss any changes with you.  Activity  You are encouraged to walk as much as you can. You can slowly return to normal activities during the month after your surgery. Avoid strenuous activity and heavy lifting until your doctor tells you it's OK. Avoid activities such as vacuuming or swinging a golf club. Do not drive until your doctor give the OK and you are no longer taking prescription pain medications. It is also normal to have difficulty with sleep habits, eating and bowel movement after surgery. These will go away with time.  Bathing/Showering  You may shower after you go home. Do not soak in a bathtub, hot tub, or swim until the incision heals completely.  Incision Care  Clean your incision with mild soap and water. Shower every day. Pat the area dry with a clean towel. You do not need a bandage unless otherwise instructed. Do not apply any ointments or creams to your incision. If you have open wounds you will be instructed how to care for them or a visiting nurse may be arranged for you. If you have staples or sutures along your incision they will be removed at your post-op appointment. You may have skin glue on your incision. Do not peel it off. It will come off on its own in about one week. If you have a great deal of moisture in your groin, use a gauze help keep this area dry.  Diet  Resume your normal diet. There are no special food restrictions following this procedure. A low fat/ low cholesterol diet is recommended for all patients with vascular disease. In order to heal from your surgery, it is CRITICAL to get adequate nutrition. Your body requires vitamins, minerals, and protein. Vegetables are the best source of vitamins and minerals. Vegetables also provide the  perfect balance of protein. Processed food has little nutritional value, so try to avoid this.  Medications  Resume taking all your medications unless your doctor or nurse practitioner tells you not to. If your incision is causing pain, you may take over-the-counter pain relievers such as acetaminophen (Tylenol). If you were prescribed a stronger pain medication, please aware these medication can cause nausea and constipation. Prevent nausea by taking the medication with a snack or meal. Avoid constipation by drinking plenty of fluids and eating foods with high amount of fiber, such as fruits, vegetables, and grains. Take Colase 100 mg (an over-the-counter stool softener) twice a day as needed for constipation. Do not take Tylenol if you are taking prescription pain medications.  Follow Up  Our office will schedule a follow up appointment 2-3 weeks following discharge.  Please call us immediately for any of the following conditions  Severe or worsening pain in your legs or feet while at rest or while walking Increase pain, redness, warmth, or drainage (pus) from your incision site(s) Fever of 101 degree or higher The swelling in your leg with the bypass suddenly worsens and becomes more painful than when you were in the hospital If you have been instructed to feel your graft pulse then you should do so every day. If you can no longer feel this pulse, call the office immediately. Not all patients are given this instruction.  Leg swelling is common after leg bypass surgery.  The swelling should improve over a few months   following surgery. To improve the swelling, you may elevate your legs above the level of your heart while you are sitting or resting. Your surgeon or physician assistant may ask you to apply an ACE wrap or wear compression (TED) stockings to help to reduce swelling.  Reduce your risk of vascular disease  Stop smoking. If you would like help call [facility] at 1-800-QUIT-NOW  (1-800-784-8669) or [place] at 336-586-4000.  Manage your cholesterol Maintain a desired weight Control your diabetes weight Control your diabetes Keep your blood pressure down  If you have any questions, please call the office at [phone]   

## 2017-11-29 ENCOUNTER — Other Ambulatory Visit: Payer: Self-pay

## 2017-11-29 DIAGNOSIS — N2581 Secondary hyperparathyroidism of renal origin: Secondary | ICD-10-CM | POA: Diagnosis not present

## 2017-11-29 DIAGNOSIS — N186 End stage renal disease: Secondary | ICD-10-CM | POA: Diagnosis not present

## 2017-11-30 ENCOUNTER — Telehealth: Payer: Self-pay | Admitting: Vascular Surgery

## 2017-11-30 DIAGNOSIS — N186 End stage renal disease: Secondary | ICD-10-CM | POA: Diagnosis not present

## 2017-11-30 DIAGNOSIS — N2581 Secondary hyperparathyroidism of renal origin: Secondary | ICD-10-CM | POA: Diagnosis not present

## 2017-11-30 NOTE — Telephone Encounter (Signed)
-----   Message from Dagoberto Ligas, PA-C sent at 11/28/2017 11:20 AM EDT ----- Can you schedule an appt for this pt in 2 weeks to see Dr. Donzetta Matters.  PO R CFA endarterectomy and profundoplasty with thrombectomy. Thanks, [facility]

## 2017-11-30 NOTE — Telephone Encounter (Signed)
sch appt phone NA mld ltr 12/16/17 9am p/o MD

## 2017-12-01 ENCOUNTER — Other Ambulatory Visit: Payer: Self-pay | Admitting: *Deleted

## 2017-12-01 DIAGNOSIS — N2581 Secondary hyperparathyroidism of renal origin: Secondary | ICD-10-CM | POA: Diagnosis not present

## 2017-12-01 DIAGNOSIS — Z992 Dependence on renal dialysis: Secondary | ICD-10-CM | POA: Diagnosis not present

## 2017-12-01 DIAGNOSIS — N186 End stage renal disease: Secondary | ICD-10-CM | POA: Diagnosis not present

## 2017-12-01 DIAGNOSIS — E1122 Type 2 diabetes mellitus with diabetic chronic kidney disease: Secondary | ICD-10-CM | POA: Diagnosis not present

## 2017-12-01 NOTE — Patient Outreach (Addendum)
[facility] [facility]) Care Management  12/01/2017  Kristen Bradley [birth year] [id number]   Case closure from [place]. Patient was admitted to hospital. Patient will be transferred for Complex  Disease management.  [place] Care Management [phone]

## 2017-12-01 NOTE — Patient Outreach (Signed)
[place] [facility]) Care Management  12/01/2017  Kristen Bradley 09/03/1945 [id number]   Referral received from telephonic care manager as member was recently discharged from hospital after procedure (right femoral endarterectomy and angioplasty) for peripheral artery disease.  Noted that primary MD office will complete transition of care assessment.  Per chart, she also has PVD, HTN, cardiomyopathy, heart failure, CAD, diabetes, and chronic kidney disease.  Call placed to member, she state this is not a good time to talk.  This care manager requested for member to call back when she was available.  If no call back, will follow up within the next 4 business days.  Valente David, [place], MSN [place] [phone]

## 2017-12-02 DIAGNOSIS — N186 End stage renal disease: Secondary | ICD-10-CM | POA: Diagnosis not present

## 2017-12-02 DIAGNOSIS — N2581 Secondary hyperparathyroidism of renal origin: Secondary | ICD-10-CM | POA: Diagnosis not present

## 2017-12-03 DIAGNOSIS — N2581 Secondary hyperparathyroidism of renal origin: Secondary | ICD-10-CM | POA: Diagnosis not present

## 2017-12-03 DIAGNOSIS — N186 End stage renal disease: Secondary | ICD-10-CM | POA: Diagnosis not present

## 2017-12-04 DIAGNOSIS — N2581 Secondary hyperparathyroidism of renal origin: Secondary | ICD-10-CM | POA: Diagnosis not present

## 2017-12-04 DIAGNOSIS — N186 End stage renal disease: Secondary | ICD-10-CM | POA: Diagnosis not present

## 2017-12-05 DIAGNOSIS — N186 End stage renal disease: Secondary | ICD-10-CM | POA: Diagnosis not present

## 2017-12-05 DIAGNOSIS — N2581 Secondary hyperparathyroidism of renal origin: Secondary | ICD-10-CM | POA: Diagnosis not present

## 2017-12-06 DIAGNOSIS — N2581 Secondary hyperparathyroidism of renal origin: Secondary | ICD-10-CM | POA: Diagnosis not present

## 2017-12-06 DIAGNOSIS — N186 End stage renal disease: Secondary | ICD-10-CM | POA: Diagnosis not present

## 2017-12-07 ENCOUNTER — Other Ambulatory Visit: Payer: Self-pay | Admitting: *Deleted

## 2017-12-07 DIAGNOSIS — N2581 Secondary hyperparathyroidism of renal origin: Secondary | ICD-10-CM | POA: Diagnosis not present

## 2017-12-07 DIAGNOSIS — N186 End stage renal disease: Secondary | ICD-10-CM | POA: Diagnosis not present

## 2017-12-07 NOTE — Patient Outreach (Signed)
[place] [facility]) Care Management  12/07/2017  Kristen Bradley 1945-10-26 [id number]   2nd attempt made to contact member post discharge for telephone assessment.  No answer, unable to leave a message.  Unsuccessful outreach letter sent.  Will follow up within the next 4 business days.  Valente David, [place], MSN [place] [phone]

## 2017-12-08 DIAGNOSIS — N186 End stage renal disease: Secondary | ICD-10-CM | POA: Diagnosis not present

## 2017-12-08 DIAGNOSIS — N2581 Secondary hyperparathyroidism of renal origin: Secondary | ICD-10-CM | POA: Diagnosis not present

## 2017-12-09 DIAGNOSIS — N186 End stage renal disease: Secondary | ICD-10-CM | POA: Diagnosis not present

## 2017-12-09 DIAGNOSIS — N2581 Secondary hyperparathyroidism of renal origin: Secondary | ICD-10-CM | POA: Diagnosis not present

## 2017-12-09 LAB — CUP PACEART REMOTE DEVICE CHECK
Battery Remaining Longevity: 72 mo
Battery Voltage: 2.97 V
Brady Statistic AP VS Percent: 0.01 %
Brady Statistic AS VS Percent: 1.42 %
Brady Statistic RV Percent Paced: 96.61 %
HIGH POWER IMPEDANCE MEASURED VALUE: 53 Ohm
Implantable Lead Implant Date: 20181005
Implantable Lead Implant Date: 20181005
Implantable Lead Implant Date: 20181005
Implantable Lead Location: 753860
Implantable Lead Location: 753860
Implantable Lead Model: 3830
Implantable Pulse Generator Implant Date: 20181005
Lead Channel Impedance Value: 285 Ohm
Lead Channel Impedance Value: 304 Ohm
Lead Channel Impedance Value: 342 Ohm
Lead Channel Pacing Threshold Amplitude: 0.625 V
Lead Channel Pacing Threshold Pulse Width: 0.4 ms
Lead Channel Sensing Intrinsic Amplitude: 16.125 mV
Lead Channel Sensing Intrinsic Amplitude: 2.875 mV
Lead Channel Setting Pacing Amplitude: 0.5 V
Lead Channel Setting Pacing Pulse Width: 0.03 ms
Lead Channel Setting Sensing Sensitivity: 0.3 mV
MDC IDC LEAD LOCATION: 753859
MDC IDC MSMT LEADCHNL LV IMPEDANCE VALUE: 513 Ohm
MDC IDC MSMT LEADCHNL RA IMPEDANCE VALUE: 532 Ohm
MDC IDC MSMT LEADCHNL RA SENSING INTR AMPL: 2.875 mV
MDC IDC MSMT LEADCHNL RV IMPEDANCE VALUE: 247 Ohm
MDC IDC MSMT LEADCHNL RV SENSING INTR AMPL: 16.125 mV
MDC IDC SESS DTM: 20191015073624
MDC IDC SET LEADCHNL LV PACING AMPLITUDE: 2.5 V
MDC IDC SET LEADCHNL LV PACING PULSEWIDTH: 1 ms
MDC IDC SET LEADCHNL RA PACING AMPLITUDE: 1.5 V
MDC IDC STAT BRADY AP VP PERCENT: 7.57 %
MDC IDC STAT BRADY AS VP PERCENT: 91.01 %
MDC IDC STAT BRADY RA PERCENT PACED: 7.44 %

## 2017-12-10 DIAGNOSIS — N186 End stage renal disease: Secondary | ICD-10-CM | POA: Diagnosis not present

## 2017-12-10 DIAGNOSIS — N2581 Secondary hyperparathyroidism of renal origin: Secondary | ICD-10-CM | POA: Diagnosis not present

## 2017-12-11 DIAGNOSIS — N186 End stage renal disease: Secondary | ICD-10-CM | POA: Diagnosis not present

## 2017-12-11 DIAGNOSIS — N2581 Secondary hyperparathyroidism of renal origin: Secondary | ICD-10-CM | POA: Diagnosis not present

## 2017-12-12 DIAGNOSIS — N2581 Secondary hyperparathyroidism of renal origin: Secondary | ICD-10-CM | POA: Diagnosis not present

## 2017-12-12 DIAGNOSIS — N186 End stage renal disease: Secondary | ICD-10-CM | POA: Diagnosis not present

## 2017-12-13 ENCOUNTER — Other Ambulatory Visit: Payer: Self-pay | Admitting: *Deleted

## 2017-12-13 DIAGNOSIS — N2581 Secondary hyperparathyroidism of renal origin: Secondary | ICD-10-CM | POA: Diagnosis not present

## 2017-12-13 DIAGNOSIS — N186 End stage renal disease: Secondary | ICD-10-CM | POA: Diagnosis not present

## 2017-12-13 NOTE — Patient Outreach (Signed)
[facility] [facility]) Care Management  12/13/2017  Kristen Bradley Aug 04, 1945 [id number]   3rd attempt made to contact member for engagement with [facility] services.  She answers phone but state this is not a good time to talk.  This care manager requested member to call this care manager back when she is available.  Will await call back, if no call back by 11/14 will close case due to inability to contact/engage.  Valente David, [place], MSN [place] [phone]

## 2017-12-14 DIAGNOSIS — N2581 Secondary hyperparathyroidism of renal origin: Secondary | ICD-10-CM | POA: Diagnosis not present

## 2017-12-14 DIAGNOSIS — N186 End stage renal disease: Secondary | ICD-10-CM | POA: Diagnosis not present

## 2017-12-15 ENCOUNTER — Other Ambulatory Visit: Payer: Self-pay | Admitting: *Deleted

## 2017-12-15 DIAGNOSIS — N186 End stage renal disease: Secondary | ICD-10-CM | POA: Diagnosis not present

## 2017-12-15 DIAGNOSIS — N2581 Secondary hyperparathyroidism of renal origin: Secondary | ICD-10-CM | POA: Diagnosis not present

## 2017-12-15 NOTE — Patient Outreach (Signed)
[place] [facility]) Care Management  12/15/2017  SIENA POEHLER February 15, 1945 [id number]   No call back from member after multiple outreach attempts to engage and outreach letter.  Will close due to inability to contact.  Will notify primary MD of case closure.  Valente David, [place], MSN [place] [phone]

## 2017-12-16 ENCOUNTER — Encounter: Payer: Medicare HMO | Admitting: Vascular Surgery

## 2017-12-16 DIAGNOSIS — N2581 Secondary hyperparathyroidism of renal origin: Secondary | ICD-10-CM | POA: Diagnosis not present

## 2017-12-16 DIAGNOSIS — N186 End stage renal disease: Secondary | ICD-10-CM | POA: Diagnosis not present

## 2017-12-17 DIAGNOSIS — N2581 Secondary hyperparathyroidism of renal origin: Secondary | ICD-10-CM | POA: Diagnosis not present

## 2017-12-17 DIAGNOSIS — N186 End stage renal disease: Secondary | ICD-10-CM | POA: Diagnosis not present

## 2017-12-18 DIAGNOSIS — N186 End stage renal disease: Secondary | ICD-10-CM | POA: Diagnosis not present

## 2017-12-18 DIAGNOSIS — N2581 Secondary hyperparathyroidism of renal origin: Secondary | ICD-10-CM | POA: Diagnosis not present

## 2017-12-19 DIAGNOSIS — N186 End stage renal disease: Secondary | ICD-10-CM | POA: Diagnosis not present

## 2017-12-19 DIAGNOSIS — N2581 Secondary hyperparathyroidism of renal origin: Secondary | ICD-10-CM | POA: Diagnosis not present

## 2017-12-20 ENCOUNTER — Ambulatory Visit: Payer: Self-pay | Admitting: *Deleted

## 2017-12-20 DIAGNOSIS — N2581 Secondary hyperparathyroidism of renal origin: Secondary | ICD-10-CM | POA: Diagnosis not present

## 2017-12-20 DIAGNOSIS — N186 End stage renal disease: Secondary | ICD-10-CM | POA: Diagnosis not present

## 2017-12-21 DIAGNOSIS — N2581 Secondary hyperparathyroidism of renal origin: Secondary | ICD-10-CM | POA: Diagnosis not present

## 2017-12-21 DIAGNOSIS — N186 End stage renal disease: Secondary | ICD-10-CM | POA: Diagnosis not present

## 2017-12-22 DIAGNOSIS — N2581 Secondary hyperparathyroidism of renal origin: Secondary | ICD-10-CM | POA: Diagnosis not present

## 2017-12-22 DIAGNOSIS — N186 End stage renal disease: Secondary | ICD-10-CM | POA: Diagnosis not present

## 2017-12-23 ENCOUNTER — Other Ambulatory Visit: Payer: Self-pay

## 2017-12-23 ENCOUNTER — Encounter (HOSPITAL_COMMUNITY): Payer: Self-pay

## 2017-12-23 ENCOUNTER — Ambulatory Visit (HOSPITAL_COMMUNITY)
Admission: RE | Admit: 2017-12-23 | Discharge: 2017-12-23 | Disposition: A | Discharge status: Home / Self Care | Payer: Medicare HMO | Source: Ambulatory Visit | Attending: Vascular Surgery | Admitting: Vascular Surgery

## 2017-12-23 ENCOUNTER — Ambulatory Visit (INDEPENDENT_AMBULATORY_CARE_PROVIDER_SITE_OTHER): Payer: Self-pay | Admitting: Vascular Surgery

## 2017-12-23 ENCOUNTER — Encounter: Payer: Self-pay | Admitting: Vascular Surgery

## 2017-12-23 VITALS — BP 115/66 | HR 74 | Temp 97.5°F | Resp 14 | Ht 66.0 in | Wt 134.0 lb

## 2017-12-23 DIAGNOSIS — I779 Disorder of arteries and arterioles, unspecified: Secondary | ICD-10-CM

## 2017-12-23 DIAGNOSIS — N186 End stage renal disease: Secondary | ICD-10-CM | POA: Diagnosis not present

## 2017-12-23 DIAGNOSIS — I70213 Atherosclerosis of native arteries of extremities with intermittent claudication, bilateral legs: Secondary | ICD-10-CM

## 2017-12-23 DIAGNOSIS — N2581 Secondary hyperparathyroidism of renal origin: Secondary | ICD-10-CM | POA: Diagnosis not present

## 2017-12-23 NOTE — Progress Notes (Signed)
    Subjective:     Patient ID: Kristen Bradley, female   DOB: 1945/12/16, 72 y.o.   MRN: [id number]  HPI 72 year old female follows up after right lower extremity iliac thromboembolectomy.  When I first saw her she had right greater than left lower extremity rest pain with severely depressed ABIs and underwent iliac artery stenting.  She has known SFA occlusions bilaterally underwent vein mapping in the past which did not demonstrate any suitable saphenous vein for bypass.  She now follows up in her feet are warm.  She is able to walk through [facility] without stopping at this point.  Her wound in her right groin she says is healing well.  She has previous carotid endarterectomies has not had the studies in over a year.  She is overall doing well continues on aspirin Plavix and a statin drug.   Review of Systems Feet are feeling better    Objective:   Physical Exam Awake alert oriented Previous carotid endarterectomy sites well-healed, no current bruits Regular rate and rhythm there is a grade 1 systolic ejection murmur heard over the left second intercostal space Abdomen is soft nontender nondistended Right groin wound is healing well and there are palpable femoral pulses bilaterally Feet appear well-perfused   ABIs are 0.65 right with toe pressure 52 and 0.60 left with toe pressure 39.  This is improved from previous ABIs of 0.3 range and no toe pressure on the left could be obtained    Assessment/plan     72 year old female with a history of bilateral carotid endarterectomies and more recently bilateral common iliac artery stents with a right external iliac artery stent that subsequently thrombosed and required thromboembolectomy as well as right common femoral endarterectomy.  We will have her continue her aspirin and Plavix and a statin drug and follow-up in 6 months where we will reevaluate her stents with duplex as well as ABIs and carotid duplexes as well.  I have emphasized the need  for smoking cessation.  She will continue walking.  She will follow-up if she has issues prior to the six-month point.     Kristen Bradley C. Donzetta Matters, MD [facility] [place] Office: [phone] Pager: [phone]

## 2017-12-24 DIAGNOSIS — N2581 Secondary hyperparathyroidism of renal origin: Secondary | ICD-10-CM | POA: Diagnosis not present

## 2017-12-24 DIAGNOSIS — N186 End stage renal disease: Secondary | ICD-10-CM | POA: Diagnosis not present

## 2017-12-25 DIAGNOSIS — N186 End stage renal disease: Secondary | ICD-10-CM | POA: Diagnosis not present

## 2017-12-25 DIAGNOSIS — N2581 Secondary hyperparathyroidism of renal origin: Secondary | ICD-10-CM | POA: Diagnosis not present

## 2017-12-26 DIAGNOSIS — N186 End stage renal disease: Secondary | ICD-10-CM | POA: Diagnosis not present

## 2017-12-26 DIAGNOSIS — N2581 Secondary hyperparathyroidism of renal origin: Secondary | ICD-10-CM | POA: Diagnosis not present

## 2017-12-27 DIAGNOSIS — N2581 Secondary hyperparathyroidism of renal origin: Secondary | ICD-10-CM | POA: Diagnosis not present

## 2017-12-27 DIAGNOSIS — N186 End stage renal disease: Secondary | ICD-10-CM | POA: Diagnosis not present

## 2017-12-28 DIAGNOSIS — N186 End stage renal disease: Secondary | ICD-10-CM | POA: Diagnosis not present

## 2017-12-28 DIAGNOSIS — N2581 Secondary hyperparathyroidism of renal origin: Secondary | ICD-10-CM | POA: Diagnosis not present

## 2017-12-29 DIAGNOSIS — N2581 Secondary hyperparathyroidism of renal origin: Secondary | ICD-10-CM | POA: Diagnosis not present

## 2017-12-29 DIAGNOSIS — N186 End stage renal disease: Secondary | ICD-10-CM | POA: Diagnosis not present

## 2017-12-30 DIAGNOSIS — N2581 Secondary hyperparathyroidism of renal origin: Secondary | ICD-10-CM | POA: Diagnosis not present

## 2017-12-30 DIAGNOSIS — N186 End stage renal disease: Secondary | ICD-10-CM | POA: Diagnosis not present

## 2017-12-31 DIAGNOSIS — Z992 Dependence on renal dialysis: Secondary | ICD-10-CM | POA: Diagnosis not present

## 2017-12-31 DIAGNOSIS — N186 End stage renal disease: Secondary | ICD-10-CM | POA: Diagnosis not present

## 2017-12-31 DIAGNOSIS — N2581 Secondary hyperparathyroidism of renal origin: Secondary | ICD-10-CM | POA: Diagnosis not present

## 2017-12-31 DIAGNOSIS — E1122 Type 2 diabetes mellitus with diabetic chronic kidney disease: Secondary | ICD-10-CM | POA: Diagnosis not present

## 2018-01-01 DIAGNOSIS — N2581 Secondary hyperparathyroidism of renal origin: Secondary | ICD-10-CM | POA: Diagnosis not present

## 2018-01-01 DIAGNOSIS — N186 End stage renal disease: Secondary | ICD-10-CM | POA: Diagnosis not present

## 2018-01-02 ENCOUNTER — Encounter: Payer: Self-pay | Admitting: Internal Medicine

## 2018-01-02 DIAGNOSIS — N2581 Secondary hyperparathyroidism of renal origin: Secondary | ICD-10-CM | POA: Diagnosis not present

## 2018-01-02 DIAGNOSIS — N186 End stage renal disease: Secondary | ICD-10-CM | POA: Diagnosis not present

## 2018-01-03 ENCOUNTER — Telehealth: Payer: Self-pay | Admitting: Vascular Surgery

## 2018-01-03 DIAGNOSIS — N2581 Secondary hyperparathyroidism of renal origin: Secondary | ICD-10-CM | POA: Diagnosis not present

## 2018-01-03 DIAGNOSIS — N186 End stage renal disease: Secondary | ICD-10-CM | POA: Diagnosis not present

## 2018-01-03 NOTE — Telephone Encounter (Addendum)
On 12/26Shirlee Limerick with [facility] faxed a report that Ms. Noviello is having foot pain.  An ASAP appointment with Dr. Donzetta Matters was requested.  Dr. Donzetta Matters was messaged and recommended that she see a provider or go to the ED if her foot pain was too severe.    I contacted Ms. Mertens on 12/4.  She reported that her foot pain had improved, but she still had some in her great toe.  She requested an appointment next week.  I scheduled the appointment with Vinnie Level Nickel on Friday, 01/13/18 since Dr. Donzetta Matters is in the office that day.  She agreed to the appointment and that she would contact us if her symptoms changed, specifically if she did not need the appointment, anymore.  I gave her the office telephone number to call if needed.  I contacted Shirlee Limerick at [employer] [facility] [employer] who made Ms. Sovine's nurse aware.  Ovidio Hanger

## 2018-01-04 DIAGNOSIS — N186 End stage renal disease: Secondary | ICD-10-CM | POA: Diagnosis not present

## 2018-01-04 DIAGNOSIS — N2581 Secondary hyperparathyroidism of renal origin: Secondary | ICD-10-CM | POA: Diagnosis not present

## 2018-01-05 DIAGNOSIS — N2581 Secondary hyperparathyroidism of renal origin: Secondary | ICD-10-CM | POA: Diagnosis not present

## 2018-01-05 DIAGNOSIS — N186 End stage renal disease: Secondary | ICD-10-CM | POA: Diagnosis not present

## 2018-01-06 DIAGNOSIS — N186 End stage renal disease: Secondary | ICD-10-CM | POA: Diagnosis not present

## 2018-01-06 DIAGNOSIS — N2581 Secondary hyperparathyroidism of renal origin: Secondary | ICD-10-CM | POA: Diagnosis not present

## 2018-01-07 DIAGNOSIS — N2581 Secondary hyperparathyroidism of renal origin: Secondary | ICD-10-CM | POA: Diagnosis not present

## 2018-01-07 DIAGNOSIS — N186 End stage renal disease: Secondary | ICD-10-CM | POA: Diagnosis not present

## 2018-01-08 DIAGNOSIS — N2581 Secondary hyperparathyroidism of renal origin: Secondary | ICD-10-CM | POA: Diagnosis not present

## 2018-01-08 DIAGNOSIS — N186 End stage renal disease: Secondary | ICD-10-CM | POA: Diagnosis not present

## 2018-01-09 DIAGNOSIS — N186 End stage renal disease: Secondary | ICD-10-CM | POA: Diagnosis not present

## 2018-01-09 DIAGNOSIS — N2581 Secondary hyperparathyroidism of renal origin: Secondary | ICD-10-CM | POA: Diagnosis not present

## 2018-01-10 DIAGNOSIS — N186 End stage renal disease: Secondary | ICD-10-CM | POA: Diagnosis not present

## 2018-01-10 DIAGNOSIS — N2581 Secondary hyperparathyroidism of renal origin: Secondary | ICD-10-CM | POA: Diagnosis not present

## 2018-01-11 DIAGNOSIS — N2581 Secondary hyperparathyroidism of renal origin: Secondary | ICD-10-CM | POA: Diagnosis not present

## 2018-01-11 DIAGNOSIS — N186 End stage renal disease: Secondary | ICD-10-CM | POA: Diagnosis not present

## 2018-01-12 DIAGNOSIS — N2581 Secondary hyperparathyroidism of renal origin: Secondary | ICD-10-CM | POA: Diagnosis not present

## 2018-01-12 DIAGNOSIS — N186 End stage renal disease: Secondary | ICD-10-CM | POA: Diagnosis not present

## 2018-01-13 ENCOUNTER — Ambulatory Visit (INDEPENDENT_AMBULATORY_CARE_PROVIDER_SITE_OTHER): Payer: Medicare HMO | Admitting: Family

## 2018-01-13 ENCOUNTER — Other Ambulatory Visit: Payer: Self-pay

## 2018-01-13 ENCOUNTER — Encounter: Payer: Self-pay | Admitting: Family

## 2018-01-13 VITALS — BP 95/60 | HR 80 | Temp 97.7°F | Resp 14 | Ht 66.0 in | Wt 123.0 lb

## 2018-01-13 DIAGNOSIS — I6523 Occlusion and stenosis of bilateral carotid arteries: Secondary | ICD-10-CM

## 2018-01-13 DIAGNOSIS — N186 End stage renal disease: Secondary | ICD-10-CM | POA: Diagnosis not present

## 2018-01-13 DIAGNOSIS — E1151 Type 2 diabetes mellitus with diabetic peripheral angiopathy without gangrene: Secondary | ICD-10-CM | POA: Diagnosis not present

## 2018-01-13 DIAGNOSIS — I779 Disorder of arteries and arterioles, unspecified: Secondary | ICD-10-CM

## 2018-01-13 DIAGNOSIS — M79675 Pain in left toe(s): Secondary | ICD-10-CM

## 2018-01-13 DIAGNOSIS — Z9889 Other specified postprocedural states: Secondary | ICD-10-CM

## 2018-01-13 DIAGNOSIS — Z87891 Personal history of nicotine dependence: Secondary | ICD-10-CM

## 2018-01-13 DIAGNOSIS — N2581 Secondary hyperparathyroidism of renal origin: Secondary | ICD-10-CM | POA: Diagnosis not present

## 2018-01-13 NOTE — Progress Notes (Signed)
[facility]    CC: Left great toe pain, with history of peripheral artery occlusive disease and extracranial carotid artery stenosis, ESRD  History of Present Illness Kristen Bradley is a 72 y.o. female who is s/p stenting of bilateral common iliac arteries and stenting of right external iliac artery on 11-21-17 by Dr. Donzetta Matters for critical bilateral lower extremity ischemia with rest pain, ESRD, then Right iliac thromboembolectomy and Right common femoral endarterectomy with profundoplasty and bovine pericardial patch on 11-25-17 by Dr. Carlis Abbott for Acute on chronic limb ischemia of the right lower extremity.  She has known SFA occlusions bilaterally and underwent vein mapping in the past which did not demonstrate any suitable saphenous vein for bypass.  She is also s/p right CEA on 01/05/2012, and left CEA using patch angioplasty Vascu-Guard bovine patch 1cm x 6cmon 02/08/2012 by Dr. [name].   She was last evaluated by Dr. Donzetta Matters on 12-23-17. At that time pt was to continue her aspirin and Plavix and a statin drug and follow-up in 6 months where we will reevaluate her stents with duplex as well as ABIs and carotid duplexes as well.  I have emphasized the need for smoking cessation.  She will continue walking.  She was to follow-up if she has issues prior to the six-month point.  She returns today with c/o tingling and pain at plantar aspects of toes of both feet for an unknown length of time, seems months or years. She recently had pain in her left toes with walking, this seems to have improved.   On 12/26Shirlee Limerick with [facility] faxed a report that [name] is having foot pain.  An ASAP appointment with Dr. Donzetta Matters was requested.  Dr. Donzetta Matters was messaged and recommended that she see a provider or go to the ED if her foot pain was too severe.   [name] ws contacted by our office on 01/04/18, and  reported that her foot pain had improved, but she still had some in her great  toe.  At her visit with Dr. [name] on 01/26/2013 his assessment was the following:  Her sx pattern for her Left leg and ABI are not consistent with L PAD as the etiology for her sx. I would consider spinal evaluation with +/- EMG/NCV studies. The patient needs to stop smoking otherwise her carotid arteries are likely to restenose and her PAD will progress with time. The pt's prior left corner of mouth drooping is NOT consistent with marginal mandibular drooping as it was CONTRALATERAL to the R CEA, suggesting possible small contralateral CVA.   Pt denies anysubsequentstroke or TIA symptoms.  She denies steal symptoms in either upper extremity, denies dizziness.  She started peritoneal dialysis in July 2019; she does not like this, does not want HD, states if this is not working, then she "will give it up".   Shehad an ICD placement in October 2018.   Diabetic: Yes, states she does not recall her last A1C, not on file Tobacco use: smoker until February 2018(1/3 ppd, started smoking at about age 59), states she stopped several times. She indicates she is not smoking, but has the odor of cigarette smoke on her person  Pt meds include:  Statin :no Betablocker: Yes ASA: Yes Other anticoagulants/antiplatelets:no   Past Medical History:  Diagnosis Date  . 3-vessel CAD - treated medically  02/26/2016  . Acute renal failure superimposed on stage 3 chronic kidney disease ([place])   . Adrenal tumor 08/2007  surgery  . Aftercare following surgery of the circulatory system, [place] 05/26/2012  . AKI (acute kidney injury) ([place])   . Atherosclerosis of native artery of extremity with intermittent claudication ([place]) 01/26/2013  . CAD, multiple vessel   . Cancer of right breast [facility])    s/p lumpectomy, XRT  . Cardiomyopathy, ischemic   . Carotid artery disease (HCC)    s/p bilateral CEA  . Carotid stenosis, bilateral-s/p Bilateral CEA 11/05/2011  . Chronic ITP (idiopathic  thrombocytopenia) (HCC) 02/24/2016  . Chronic systolic (congestive) heart failure ([place]) 11/05/2016  . Chronic systolic heart failure ([place]) 03/03/2016  . CKD (chronic kidney disease) stage 4, GFR 15-29 ml/min (HCC)   . Dyspnea   . Elevated brain natriuretic peptide (BNP) level 01/30/2016  . Elevated troponin I level 01/30/2016  . Hepatitis C    "got treatment for it" (01/29/2016)  . HFrEF (heart failure with reduced ejection fraction) ([place])   . High cholesterol    takes Lopid daily  . History of UTI    takes Diflucan dail--pt states no uti in a couple of yrs though  . Hypertension    takes Losartan,Amlodipine,HCTZ,and Atenolol daily and Clatarpres  . Intermittent claudication ([place]) 11/05/2011  . Ischemic cardiomyopathy    a. 11/2016 s/p MDT BiV ICD w/ his bundle pacing.  . Joint pain    legs  . LBBB- new 01/30/2016  . NSTEMI (non-ST elevated myocardial infarction) ([place])   . Occlusion and stenosis of carotid artery without mention of cerebral infarction 12/17/2011  . PAD (peripheral artery disease) ([place]) 11/26/2011  . Pain in limb 09/08/2012  . Peripheral vascular disease ([place]) 11/05/2011  . Renal artery stenosis (HCC)    Right renal artery stent 05/31/06  . Renal failure syndrome   . Sinus bradycardia   . Sinus pause   . Stroke [facility]) Jan. 7, 2014  . Thrombocytopenia- plts 81K 01/30/2016  . Tobacco abuse   . Type 2 diabetes mellitus with vascular disease ([place])   . Type II diabetes mellitus ([place])     Social History Social History   Tobacco Use  . Smoking status: Former Smoker    Packs/day: 0.50    Years: 53.00    Pack years: 26.50    Types: Cigarettes    Last attempt to quit: 03/2016    Years since quitting: 1.8  . Smokeless tobacco: Never Used  Substance Use Topics  . Alcohol use: No    Alcohol/week: 0.0 standard drinks  . Drug use: No    Family History Family History  Problem Relation Age of Onset  . Cancer Mother        ? type  . Cancer Father        ? type  .  Diabetes Sister   . Hypertension Sister   . Diabetes Brother   . Hypertension Brother   . CAD Neg Hx     Past Surgical History:  Procedure Laterality Date  . ABDOMINAL AORTOGRAM W/LOWER EXTREMITY N/A 11/21/2017   Procedure: ABDOMINAL AORTOGRAM W/LOWER EXTREMITY;  Surgeon: Waynetta Sandy, MD;  Location: [place] [facility];  Service: Cardiovascular;  Laterality: N/A;  . ABDOMINAL HYSTERECTOMY    . ADRENAL GLAND SURGERY  2009  . BIV ICD INSERTION CRT-D N/A 11/05/2016   Procedure: BIV ICD INSERTION CRT-D;  Surgeon: Evans Lance, MD;  Location: [place] [facility];  Service: Cardiovascular;  Laterality: N/A;  . BREAST LUMPECTOMY Right 2009  . CARDIAC CATHETERIZATION N/A 02/03/2016   Procedure: Right/Left Heart  Cath and Coronary Angiography;  Surgeon: Burnell Blanks, MD;  Location: [place] [facility];  Service: Cardiovascular;  Laterality: N/A;  . EMBOLECTOMY Right 11/25/2017   Procedure: ILIAC EMBOLECTOMY;  Surgeon: Marty Heck, MD;  Location: [place];  Service: Vascular;  Laterality: Right;  . ENDARTERECTOMY  01/05/2012   Procedure: ENDARTERECTOMY CAROTID;  Surgeon: Conrad [place], MD;  Location: [place];  Service: Vascular;  Laterality: Right;  Right Carotid Artery Endarterectomy, right stump pressure, intraoperative ultrasound  . ENDARTERECTOMY  02/08/2012   Procedure: ENDARTERECTOMY CAROTID;  Surgeon: Conrad Brinnon, MD;  Location: [place];  Service: Vascular;  Laterality: Left;  . ENDARTERECTOMY FEMORAL Right 11/25/2017   Procedure: RIGHT FEMORAL ENDARTERECTOMY WITH PROFUNOPLASTY AND BOVINE PATCH ANGIOPLAST RIGHT ILIOFEMORAL; RIGHT ILIAC THROMBOEMBOLECTOMY;  Surgeon: Marty Heck, MD;  Location: [place];  Service: Vascular;  Laterality: Right;  . LUMBAR DISC SURGERY    . PATCH ANGIOPLASTY  02/08/2012   Procedure: PATCH ANGIOPLASTY;  Surgeon: Conrad Challis, MD;  Location: [facility] OR;  Service: Vascular;  Laterality: Left;  Carotid artery patch angioplasty using Vascu-Guard  bovine patch 1cm x 6cm.  Marland Kitchen PERIPHERAL VASCULAR INTERVENTION Bilateral 11/21/2017   Procedure: PERIPHERAL VASCULAR INTERVENTION;  Surgeon: Waynetta Sandy, MD;  Location: [place] [facility];  Service: Cardiovascular;  Laterality: Bilateral;  Iliac Stents  . RENAL ARTERY STENT Right 05/2006    Allergies  Allergen Reactions  . Chlorhexidine Gluconate Itching  . Ace Inhibitors Cough  . Dilaudid [Hydromorphone Hcl] Itching    Current Outpatient Medications  Medication Sig Dispense Refill  . acetaminophen (TYLENOL) 500 MG tablet Take 1,000 mg by mouth every 6 (six) hours as needed for mild pain or headache.     Marland Kitchen aspirin EC 81 MG tablet Take 1 tablet (81 mg total) by mouth daily. 2 tablet 0  . carvedilol (COREG) 6.25 MG tablet Take 1 tablet (6.25 mg total) by mouth 2 (two) times daily with a meal. 60 tablet 3  . cinacalcet (SENSIPAR) 60 MG tablet Take 60 mg by mouth daily.  12  . clopidogrel (PLAVIX) 75 MG tablet Take 1 tablet (75 mg total) by mouth daily. 30 tablet 3  . furosemide (LASIX) 80 MG tablet TAKE 1 TABLET BY MOUTH TWICE DAILY (Patient taking differently: Take 80 mg by mouth daily. ) 180 tablet 3  . glipiZIDE (GLUCOTROL) 5 MG tablet Take 5 mg by mouth daily before breakfast.     . isosorbide mononitrate (IMDUR) 60 MG 24 hr tablet Take 1 tablet (60 mg total) by mouth daily. 30 tablet 3  . nitroGLYCERIN (NITROSTAT) 0.4 MG SL tablet Place 1 tablet (0.4 mg total) under the tongue every 5 (five) minutes as needed for chest pain. 25 tablet 3  . polyethylene glycol (MIRALAX / GLYCOLAX) packet Take 17 g by mouth daily as needed for mild constipation or moderate constipation. 30 each 3  . rosuvastatin (CRESTOR) 20 MG tablet Take 1 tablet (20 mg total) by mouth daily. 30 tablet 11  . ezetimibe (ZETIA) 10 MG tablet Take 0.5 tablets (5 mg total) by mouth daily. 45 tablet 3  . oxyCODONE-acetaminophen (PERCOCET/ROXICET) 5-325 MG tablet Take 1 tablet by mouth every 6 (six) hours as needed  for moderate pain. (Patient not taking: Reported on 01/13/2018) 20 tablet 0   No current facility-administered medications for this visit.     ROS: See HPI for pertinent positives and negatives.   Physical Examination  Vitals:   01/13/18 1304 01/13/18 1310  BP:  92/64 95/60  Pulse: 80 80  Resp: 14   Temp: 97.7 F (36.5 C)   TempSrc: Oral   SpO2: 100%   Weight: 123 lb (55.8 kg)   Height: 5\' 6"  (1.676 m)    Body mass index is 19.85 kg/m.  General: WD thin female in NAD GAIT: normal Eyes: PERRLA HENT: No gross abnormalities.  Pulmonary:  Respirations are non-labored, fair air movement in all fields, CTAB, no rales, rhonchi, or wheezes. Cardiac: regular rhythm, no detected murmur. ICD palpated left upper chest.   VASCULAR EXAM Carotid Bruits Right Left   Positive Positive     Abdominal aortic pulse is not palpable. Has PD catheter under dressing, soft abdominal distention.  Radial pulses are 1+ palpable and equal.      + Doppler signal at webspace base of first and second toes, both feet.     Brisk capillary refill all toes of both feet                                                                                                                                  LE Pulses Right Left       FEMORAL  3+ palpable 2+ palpable        POPLITEAL  not palpable   not palpable       POSTERIOR TIBIAL  not palpable, + Doppler signal   not palpable, + Doppler signal        DORSALIS PEDIS      ANTERIOR TIBIAL not palpable, + Doppler signal  not palpable, + Doppler signal     Gastrointestinal: soft, nontender, BS WNL, no r/g, no palpable masses. Abdomen softly distended. PD catheter taped under dressing.  Musculoskeletal: no muscle atrophy/wasting. M/S 4/5 throughout, extremities without ischemic changes except darkened skin at base of left 1st toe, medial aspect.  Skin: No rashes, no ulcers, no cellulitis.  See MS.  Neurologic:  A&O X 3; appropriate affect, sensation  is normal; speech is normal, CN 2-12 intact, pain and light touch intact in extremities, motor exam as listed above. Psychiatric: Normal thought content, mood appropriate to clinical situation     ASSESSMENT: TOVE WIDEMAN is a 72 y.o. female who is s/p stenting of bilateral common iliac arteries and stenting of right external iliac artery on 11-21-17 by Dr. Donzetta Matters for critical bilateral lower extremity ischemia with rest pain, ESRD, then Right iliac thromboembolectomy and Right common femoral endarterectomy with profundoplasty and bovine pericardial patch on 11-25-17 by Dr. Carlis Abbott for Acute on chronic limb ischemia of the right lower extremity.   She is also s/p right CEA on 01/05/2012, and left CEA using patch angioplasty Vascu-Guard bovine patch 1cm x 6cmon 02/08/2012 by Dr. [name].   She has ESRD and uses home peritoneal dialysis.   She returns today with c/o tingling and pain at plantar aspects of toes of both feet for an unknown length of time, seems months or  years. She recently had pain in her left toes with walking, this seems to have improved.  She c/o left great toe pain that seems to have improved. She has no pain at left great toe when palpated deeply and compressed.  No signs of ischemia in her feet or legs, + Doppler signals at webspace between 1st and 2nd toes of both feet.  No vascular source of pain could be determined. Consider DM neuropathy, and/or residual ischemic neuropathy after arterial perfusion restored.      PLAN:  Based on the patient's HPI and examination, pt will return to clinic as scheduled about May 2020 with bilateral iliac artery stent duplex, ABI's, and carotid duplex.   I discussed in depth with the patient the nature of atherosclerosis, and emphasized the importance of maximal medical management including strict control of blood pressure, blood glucose, and lipid levels, obtaining regular exercise, and cessation of smoking.  The patient is aware that without  maximal medical management the underlying atherosclerotic disease process will progress, limiting the benefit of any interventions.  The patient was given information about PAD including signs, symptoms, treatment, what symptoms should prompt the patient to seek immediate medical care, and risk reduction measures to take.  Clemon Chambers, RN, MSN, FNP-C [facility] Phone: [phone]  Clinic MD: Donzetta Matters  01/13/18 1:14 PM

## 2018-01-14 DIAGNOSIS — N2581 Secondary hyperparathyroidism of renal origin: Secondary | ICD-10-CM | POA: Diagnosis not present

## 2018-01-14 DIAGNOSIS — N186 End stage renal disease: Secondary | ICD-10-CM | POA: Diagnosis not present

## 2018-01-15 DIAGNOSIS — N2581 Secondary hyperparathyroidism of renal origin: Secondary | ICD-10-CM | POA: Diagnosis not present

## 2018-01-15 DIAGNOSIS — N186 End stage renal disease: Secondary | ICD-10-CM | POA: Diagnosis not present

## 2018-01-16 ENCOUNTER — Encounter: Payer: Self-pay | Admitting: Nephrology

## 2018-01-16 DIAGNOSIS — N2581 Secondary hyperparathyroidism of renal origin: Secondary | ICD-10-CM | POA: Diagnosis not present

## 2018-01-16 DIAGNOSIS — N186 End stage renal disease: Secondary | ICD-10-CM | POA: Diagnosis not present

## 2018-01-17 DIAGNOSIS — N186 End stage renal disease: Secondary | ICD-10-CM | POA: Diagnosis not present

## 2018-01-17 DIAGNOSIS — N2581 Secondary hyperparathyroidism of renal origin: Secondary | ICD-10-CM | POA: Diagnosis not present

## 2018-01-18 DIAGNOSIS — N2581 Secondary hyperparathyroidism of renal origin: Secondary | ICD-10-CM | POA: Diagnosis not present

## 2018-01-18 DIAGNOSIS — N186 End stage renal disease: Secondary | ICD-10-CM | POA: Diagnosis not present

## 2018-01-19 DIAGNOSIS — N2581 Secondary hyperparathyroidism of renal origin: Secondary | ICD-10-CM | POA: Diagnosis not present

## 2018-01-19 DIAGNOSIS — N186 End stage renal disease: Secondary | ICD-10-CM | POA: Diagnosis not present

## 2018-01-20 DIAGNOSIS — N186 End stage renal disease: Secondary | ICD-10-CM | POA: Diagnosis not present

## 2018-01-20 DIAGNOSIS — N2581 Secondary hyperparathyroidism of renal origin: Secondary | ICD-10-CM | POA: Diagnosis not present

## 2018-01-21 DIAGNOSIS — N2581 Secondary hyperparathyroidism of renal origin: Secondary | ICD-10-CM | POA: Diagnosis not present

## 2018-01-21 DIAGNOSIS — N186 End stage renal disease: Secondary | ICD-10-CM | POA: Diagnosis not present

## 2018-01-22 DIAGNOSIS — N2581 Secondary hyperparathyroidism of renal origin: Secondary | ICD-10-CM | POA: Diagnosis not present

## 2018-01-22 DIAGNOSIS — N186 End stage renal disease: Secondary | ICD-10-CM | POA: Diagnosis not present

## 2018-01-23 DIAGNOSIS — N186 End stage renal disease: Secondary | ICD-10-CM | POA: Diagnosis not present

## 2018-01-23 DIAGNOSIS — N2581 Secondary hyperparathyroidism of renal origin: Secondary | ICD-10-CM | POA: Diagnosis not present

## 2018-01-24 DIAGNOSIS — N2581 Secondary hyperparathyroidism of renal origin: Secondary | ICD-10-CM | POA: Diagnosis not present

## 2018-01-24 DIAGNOSIS — N186 End stage renal disease: Secondary | ICD-10-CM | POA: Diagnosis not present

## 2018-01-25 DIAGNOSIS — N2581 Secondary hyperparathyroidism of renal origin: Secondary | ICD-10-CM | POA: Diagnosis not present

## 2018-01-25 DIAGNOSIS — N186 End stage renal disease: Secondary | ICD-10-CM | POA: Diagnosis not present

## 2018-01-26 DIAGNOSIS — N2581 Secondary hyperparathyroidism of renal origin: Secondary | ICD-10-CM | POA: Diagnosis not present

## 2018-01-26 DIAGNOSIS — N186 End stage renal disease: Secondary | ICD-10-CM | POA: Diagnosis not present

## 2018-01-27 ENCOUNTER — Other Ambulatory Visit: Payer: Self-pay

## 2018-01-27 DIAGNOSIS — N186 End stage renal disease: Secondary | ICD-10-CM | POA: Diagnosis not present

## 2018-01-27 DIAGNOSIS — N2581 Secondary hyperparathyroidism of renal origin: Secondary | ICD-10-CM | POA: Diagnosis not present

## 2018-01-27 NOTE — Telephone Encounter (Signed)
Pharmacy is requesting medication for patient and it has never been filled with [place] before. Would you like to fill this medication?

## 2018-01-28 DIAGNOSIS — N2581 Secondary hyperparathyroidism of renal origin: Secondary | ICD-10-CM | POA: Diagnosis not present

## 2018-01-28 DIAGNOSIS — N186 End stage renal disease: Secondary | ICD-10-CM | POA: Diagnosis not present

## 2018-01-29 DIAGNOSIS — N2581 Secondary hyperparathyroidism of renal origin: Secondary | ICD-10-CM | POA: Diagnosis not present

## 2018-01-29 DIAGNOSIS — N186 End stage renal disease: Secondary | ICD-10-CM | POA: Diagnosis not present

## 2018-01-30 DIAGNOSIS — N186 End stage renal disease: Secondary | ICD-10-CM | POA: Diagnosis not present

## 2018-01-30 DIAGNOSIS — N2581 Secondary hyperparathyroidism of renal origin: Secondary | ICD-10-CM | POA: Diagnosis not present

## 2018-01-30 MED ORDER — CARVEDILOL 6.25 MG PO TABS: 6.2500 mg | ORAL_TABLET | Freq: Two times a day (BID) | ORAL | 6 refills | Status: DC

## 2018-01-31 DIAGNOSIS — Z992 Dependence on renal dialysis: Secondary | ICD-10-CM | POA: Diagnosis not present

## 2018-01-31 DIAGNOSIS — N186 End stage renal disease: Secondary | ICD-10-CM | POA: Diagnosis not present

## 2018-01-31 DIAGNOSIS — E1122 Type 2 diabetes mellitus with diabetic chronic kidney disease: Secondary | ICD-10-CM | POA: Diagnosis not present

## 2018-01-31 DIAGNOSIS — N2581 Secondary hyperparathyroidism of renal origin: Secondary | ICD-10-CM | POA: Diagnosis not present

## 2018-02-01 DIAGNOSIS — N2581 Secondary hyperparathyroidism of renal origin: Secondary | ICD-10-CM | POA: Diagnosis not present

## 2018-02-01 DIAGNOSIS — N186 End stage renal disease: Secondary | ICD-10-CM | POA: Diagnosis not present

## 2018-02-02 DIAGNOSIS — N2581 Secondary hyperparathyroidism of renal origin: Secondary | ICD-10-CM | POA: Diagnosis not present

## 2018-02-02 DIAGNOSIS — N186 End stage renal disease: Secondary | ICD-10-CM | POA: Diagnosis not present

## 2018-02-03 ENCOUNTER — Other Ambulatory Visit: Payer: Self-pay | Admitting: Cardiovascular Disease

## 2018-02-03 DIAGNOSIS — N186 End stage renal disease: Secondary | ICD-10-CM | POA: Diagnosis not present

## 2018-02-03 DIAGNOSIS — N2581 Secondary hyperparathyroidism of renal origin: Secondary | ICD-10-CM | POA: Diagnosis not present

## 2018-02-03 MED ORDER — CARVEDILOL 6.25 MG PO TABS: 6.2500 mg | ORAL_TABLET | Freq: Two times a day (BID) | ORAL | 6 refills | Status: AC

## 2018-02-04 DIAGNOSIS — N186 End stage renal disease: Secondary | ICD-10-CM | POA: Diagnosis not present

## 2018-02-04 DIAGNOSIS — N2581 Secondary hyperparathyroidism of renal origin: Secondary | ICD-10-CM | POA: Diagnosis not present

## 2018-02-05 DIAGNOSIS — N2581 Secondary hyperparathyroidism of renal origin: Secondary | ICD-10-CM | POA: Diagnosis not present

## 2018-02-05 DIAGNOSIS — N186 End stage renal disease: Secondary | ICD-10-CM | POA: Diagnosis not present

## 2018-02-06 DIAGNOSIS — N186 End stage renal disease: Secondary | ICD-10-CM | POA: Diagnosis not present

## 2018-02-06 DIAGNOSIS — N2581 Secondary hyperparathyroidism of renal origin: Secondary | ICD-10-CM | POA: Diagnosis not present

## 2018-02-07 DIAGNOSIS — N186 End stage renal disease: Secondary | ICD-10-CM | POA: Diagnosis not present

## 2018-02-07 DIAGNOSIS — N2581 Secondary hyperparathyroidism of renal origin: Secondary | ICD-10-CM | POA: Diagnosis not present

## 2018-02-08 DIAGNOSIS — N186 End stage renal disease: Secondary | ICD-10-CM | POA: Diagnosis not present

## 2018-02-08 DIAGNOSIS — N2581 Secondary hyperparathyroidism of renal origin: Secondary | ICD-10-CM | POA: Diagnosis not present

## 2018-02-09 ENCOUNTER — Ambulatory Visit: Payer: Medicare HMO | Admitting: Cardiovascular Disease

## 2018-02-09 DIAGNOSIS — N186 End stage renal disease: Secondary | ICD-10-CM | POA: Diagnosis not present

## 2018-02-09 DIAGNOSIS — N2581 Secondary hyperparathyroidism of renal origin: Secondary | ICD-10-CM | POA: Diagnosis not present

## 2018-02-09 NOTE — Progress Notes (Deleted)
No chief complaint on file.   History of Present Illness: 73 yo female with history of CAD, ischemic cardiomyopathy s/p ICD, systolic CHF, tobacco abuse, bilateral carotid artery stenosis s/p prior bilateral CEA, renal artery stenosis s/p stenting right renal artery, PAD,  hepatitis C, HTN, HLD, DM who is here today for cardiac follow up. I met her in October 2013 for pre-operative evaluation prior to planned carotid endarterectomy with Dr. [name] with [facility]. She had a stress test December 2012 in [facility] that was reported as normal. I arranged an echo in November 2013 which showed normal LV and RV size and function with no significant valvular issues. Her carotid artery disease, renal artery stenosis and LE PAD is followed in the [facility] office. She was admitted to [facility] December 2017 with volume overload, dyspnea. Cardiac cath 02/03/16 with severe 3 vessel CAD with severe calcification of all vessels. Echo with LVEF=25%, mild MR. She was not felt to be a candidate for CABG or PCI. She was readmitted February 2018 with acute CHF exacerbation. She was diuresed and had improvement in symptoms.  Given her LV systolic dysfunction and no plans for revascularization she was referred to EP and had an ICD placed by Dr. Lovena Le on 11/05/16. Her ARB was stopped in Nephrology office. She was admitted to [facility] May 2019 with acute CHF and NSTEMI complicated by acute on chronic kidney disease and pneumonia. She was found to have a right lung nodule with slow uptake on PET scanning. Lung cancer could not be excluded. She was admitted again in June 2019 with acute CHF and unstable angina. Echo in June 2019 with LVEF=20-25%. Diltiazem stopped due to worsening of CHF. She was followed by Nephrology. *** ? Peritoneal dialysis ?Marland Kitchen Stenting of the bilateral common iliac arteries October 2019 followed by occlusion and right iliac thrombectomy and right common femoral endarterectomy in October 2019  She is here today for follow up.  The patient denies any chest pain, dyspnea, palpitations, lower extremity edema, orthopnea, PND, dizziness, near syncope or syncope.       Primary Care Physician: [name], MD Vascular Surgery: [name]  Past Medical History:  Diagnosis Date  . 3-vessel CAD - treated medically  02/26/2016  . Acute renal failure superimposed on stage 3 chronic kidney disease ([place])   . Adrenal tumor 08/2007   surgery  . Aftercare following surgery of the circulatory system, [place] 05/26/2012  . AKI (acute kidney injury) ([place])   . Atherosclerosis of native artery of extremity with intermittent claudication ([place]) 01/26/2013  . CAD, multiple vessel   . Cancer of right breast [facility])    s/p lumpectomy, XRT  . Cardiomyopathy, ischemic   . Carotid artery disease (HCC)    s/p bilateral CEA  . Carotid stenosis, bilateral-s/p Bilateral CEA 11/05/2011  . Chronic ITP (idiopathic thrombocytopenia) (HCC) 02/24/2016  . Chronic systolic (congestive) heart failure ([place]) 11/05/2016  . Chronic systolic heart failure ([place]) 03/03/2016  . CKD (chronic kidney disease) stage 4, GFR 15-29 ml/min (HCC)   . Dyspnea   . Elevated brain natriuretic peptide (BNP) level 01/30/2016  . Elevated troponin I level 01/30/2016  . Hepatitis C    "got treatment for it" (01/29/2016)  . HFrEF (heart failure with reduced ejection fraction) ([place])   . High cholesterol    takes Lopid daily  . History of UTI    takes Diflucan dail--pt states no uti in a couple of yrs though  . Hypertension    takes Losartan,Amlodipine,HCTZ,and Atenolol daily  and Clatarpres  . Intermittent claudication ([place]) 11/05/2011  . Ischemic cardiomyopathy    a. 11/2016 s/p MDT BiV ICD w/ his bundle pacing.  . Joint pain    legs  . LBBB- new 01/30/2016  . NSTEMI (non-ST elevated myocardial infarction) ([place])   . Occlusion and stenosis of carotid artery without mention of cerebral infarction 12/17/2011  . PAD (peripheral artery disease) ([place]) 11/26/2011  . Pain in limb  09/08/2012  . Peripheral vascular disease ([place]) 11/05/2011  . Renal artery stenosis (HCC)    Right renal artery stent 05/31/06  . Renal failure syndrome   . Sinus bradycardia   . Sinus pause   . Stroke [facility]) Jan. 7, 2014  . Thrombocytopenia- plts 81K 01/30/2016  . Tobacco abuse   . Type 2 diabetes mellitus with vascular disease ([place])   . Type II diabetes mellitus ([place])     Past Surgical History:  Procedure Laterality Date  . ABDOMINAL AORTOGRAM W/LOWER EXTREMITY N/A 11/21/2017   Procedure: ABDOMINAL AORTOGRAM W/LOWER EXTREMITY;  Surgeon: Waynetta Sandy, MD;  Location: [place] CV LAB;  Service: Cardiovascular;  Laterality: N/A;  . ABDOMINAL HYSTERECTOMY    . ADRENAL GLAND SURGERY  2009  . BIV ICD INSERTION CRT-D N/A 11/05/2016   Procedure: BIV ICD INSERTION CRT-D;  Surgeon: Evans Lance, MD;  Location: [place] CV [facility];  Service: Cardiovascular;  Laterality: N/A;  . BREAST LUMPECTOMY Right 2009  . CARDIAC CATHETERIZATION N/A 02/03/2016   Procedure: Right/Left Heart Cath and Coronary Angiography;  Surgeon: Burnell Blanks, MD;  Location: [place] CV LAB;  Service: Cardiovascular;  Laterality: N/A;  . EMBOLECTOMY Right 11/25/2017   Procedure: ILIAC EMBOLECTOMY;  Surgeon: Marty Heck, MD;  Location: [place];  Service: Vascular;  Laterality: Right;  . ENDARTERECTOMY  01/05/2012   Procedure: ENDARTERECTOMY CAROTID;  Surgeon: Conrad Frederick, MD;  Location: [place];  Service: Vascular;  Laterality: Right;  Right Carotid Artery Endarterectomy, right stump pressure, intraoperative ultrasound  . ENDARTERECTOMY  02/08/2012   Procedure: ENDARTERECTOMY CAROTID;  Surgeon: Conrad San Juan, MD;  Location: [place];  Service: Vascular;  Laterality: Left;  . ENDARTERECTOMY FEMORAL Right 11/25/2017   Procedure: RIGHT FEMORAL ENDARTERECTOMY WITH PROFUNOPLASTY AND BOVINE PATCH ANGIOPLAST RIGHT ILIOFEMORAL; RIGHT ILIAC THROMBOEMBOLECTOMY;  Surgeon: Marty Heck, MD;  Location: [place];  Service: Vascular;  Laterality: Right;  . LUMBAR DISC SURGERY    . PATCH ANGIOPLASTY  02/08/2012   Procedure: PATCH ANGIOPLASTY;  Surgeon: Conrad Chamois, MD;  Location: [facility] OR;  Service: Vascular;  Laterality: Left;  Carotid artery patch angioplasty using Vascu-Guard bovine patch 1cm x 6cm.  Marland Kitchen PERIPHERAL VASCULAR INTERVENTION Bilateral 11/21/2017   Procedure: PERIPHERAL VASCULAR INTERVENTION;  Surgeon: Waynetta Sandy, MD;  Location: [place] CV LAB;  Service: Cardiovascular;  Laterality: Bilateral;  Iliac Stents  . RENAL ARTERY STENT Right 05/2006    Current Outpatient Medications  Medication Sig Dispense Refill  . acetaminophen (TYLENOL) 500 MG tablet Take 1,000 mg by mouth every 6 (six) hours as needed for mild pain or headache.     Marland Kitchen aspirin EC 81 MG tablet Take 1 tablet (81 mg total) by mouth daily. 2 tablet 0  . carvedilol (COREG) 6.25 MG tablet Take 1 tablet (6.25 mg total) by mouth 2 (two) times daily with a meal. 60 tablet 6  . cinacalcet (SENSIPAR) 60 MG tablet Take 60 mg by mouth daily.  12  . clopidogrel (PLAVIX) 75 MG tablet Take 1 tablet (75  mg total) by mouth daily. 30 tablet 3  . ezetimibe (ZETIA) 10 MG tablet Take 0.5 tablets (5 mg total) by mouth daily. 45 tablet 3  . furosemide (LASIX) 80 MG tablet TAKE 1 TABLET BY MOUTH TWICE DAILY (Patient taking differently: Take 80 mg by mouth daily. ) 180 tablet 3  . glipiZIDE (GLUCOTROL) 5 MG tablet Take 5 mg by mouth daily before breakfast.     . isosorbide mononitrate (IMDUR) 60 MG 24 hr tablet Take 1 tablet (60 mg total) by mouth daily. 30 tablet 3  . nitroGLYCERIN (NITROSTAT) 0.4 MG SL tablet Place 1 tablet (0.4 mg total) under the tongue every 5 (five) minutes as needed for chest pain. 25 tablet 3  . oxyCODONE-acetaminophen (PERCOCET/ROXICET) 5-325 MG tablet Take 1 tablet by mouth every 6 (six) hours as needed for moderate pain. (Patient not taking: Reported on 01/13/2018) 20 tablet 0  . polyethylene glycol (MIRALAX /  GLYCOLAX) packet Take 17 g by mouth daily as needed for mild constipation or moderate constipation. 30 each 3  . rosuvastatin (CRESTOR) 20 MG tablet Take 1 tablet (20 mg total) by mouth daily. 30 tablet 11   No current facility-administered medications for this visit.     Allergies  Allergen Reactions  . Chlorhexidine Gluconate Itching  . Ace Inhibitors Cough  . Dilaudid [Hydromorphone Hcl] Itching    Social History   Socioeconomic History  . Marital status: Divorced    Spouse name: Not on file  . Number of children: 1  . Years of education: 10th  . Highest education level: Not on file  Occupational History  . Occupation: Retired-truck Education administrator: RETIRED  Social Needs  . Financial resource strain: Not on file  . Food insecurity:    Worry: Not on file    Inability: Not on file  . Transportation needs:    Medical: Not on file    Non-medical: Not on file  Tobacco Use  . Smoking status: Former Smoker    Packs/day: 0.50    Years: 53.00    Pack years: 26.50    Types: Cigarettes    Last attempt to quit: 03/2016    Years since quitting: 1.9  . Smokeless tobacco: Never Used  Substance and Sexual Activity  . Alcohol use: No    Alcohol/week: 0.0 standard drinks  . Drug use: No  . Sexual activity: Not on file  Lifestyle  . Physical activity:    Days per week: Not on file    Minutes per session: Not on file  . Stress: Not on file  Relationships  . Social connections:    Talks on phone: Not on file    Gets together: Not on file    Attends religious service: Not on file    Active member of club or organization: Not on file    Attends meetings of clubs or organizations: Not on file    Relationship status: Not on file  . Intimate partner violence:    Fear of current or ex partner: Not on file    Emotionally abused: Not on file    Physically abused: Not on file    Forced sexual activity: Not on file  Other Topics Concern  . Not on file  Social History  Narrative  . Not on file    Family History  Problem Relation Age of Onset  . Cancer Mother        ? type  . Cancer Father        ?  type  . Diabetes Sister   . Hypertension Sister   . Diabetes Brother   . Hypertension Brother   . CAD Neg Hx     Review of Systems:  As stated in the HPI and otherwise negative.   There were no vitals taken for this visit.  Physical Examination:  General: Well developed, well nourished, NAD  HEENT: OP clear, mucus membranes moist  SKIN: warm, dry. No rashes. Neuro: No focal deficits  Musculoskeletal: Muscle strength 5/5 all ext  Psychiatric: Mood and affect normal  Neck: No JVD, no carotid bruits, no thyromegaly, no lymphadenopathy.  Lungs:Clear bilaterally, no wheezes, rhonci, crackles Cardiovascular: Regular rate and rhythm. No murmurs, gallops or rubs. Abdomen:Soft. Bowel sounds present. Non-tender.  Extremities: No lower extremity edema. Pulses are 2 + in the bilateral DP/PT.  Echo June 2019:  - Left ventricle: The cavity size was mildly dilated. Wall   thickness was normal. Systolic function was severely reduced. The   estimated ejection fraction was in the range of 20% to 25%.   Diffuse hypokinesis with disproportionately severe anteroapical   and lateral hypokinesis. Wall motion suggests CAD in the   distribution of multiple vessels. Features are consistent with a   pseudonormal left ventricular filling pattern, with concomitant   abnormal relaxation and increased filling pressure (grade 2   diastolic dysfunction). - Mitral valve: There was mild to moderate regurgitation directed   centrally. The acceleration rate of the regurgitant jet was   reduced, consistent with a low dP/dt. - Left atrium: The atrium was mildly dilated. - Tricuspid valve: There was mild-moderate regurgitation directed   centrally. - Pulmonary arteries: Systolic pressure was moderately increased.   PA peak pressure: 55 mm Hg (S).  Impressions:  -  Unusually brisk systolic and diastolic flow is seen in the ostium   of the right coronary artery. Consider coronary AV fistula.  Cardiac cath 02/03/15:  Ost RCA to Prox RCA lesion, 60 %stenosed.  Mid RCA lesion, 80 %stenosed.  Dist RCA lesion, 60 %stenosed.  RPDA lesion, 99 %stenosed.  Ost LM lesion, 50 %stenosed.  Ost Cx to Prox Cx lesion, 70 %stenosed.  Ost 3rd Mrg to 3rd Mrg lesion, 99 %stenosed.  2nd Mrg lesion, 50 %stenosed.  Ost 2nd Mrg to 2nd Mrg lesion, 20 %stenosed.  Prox Cx to Mid Cx lesion, 20 %stenosed.  Mid LAD lesion, 90 %stenosed.  1st Diag lesion, 90 %stenosed.  Dist LAD lesion, 80 %stenosed.  LV end diastolic pressure is normal.  Mid LAD to Dist LAD lesion, 20 %stenosed.  Ost LAD to Prox LAD lesion, 20 %stenosed.   1. Severe calcific three vessel CAD 2. Normal filling pressures 3. Ischemic cardiomyopathy (LV function reduced by echo)  Recommendations: She has severe calcification in all three vessels. Her anatomy is not favorable for PCI. I will ask CT surgery to see her to discuss CABG.  Diagnostic Diagram        EKG:  EKG is not *** ordered today. The ekg ordered today demonstrates   Recent Labs: 07/09/2017: B Natriuretic Peptide 1,552.5; Magnesium 2.4; TSH 2.513 10/05/2017: ALT 7 11/26/2017: Hemoglobin 8.1; Platelets 97 11/28/2017: BUN 23; Creatinine, Ser 3.60; Potassium 3.4; Sodium 138   Lipid Panel    Component Value Date/Time   CHOL 187 10/05/2017 0846   TRIG 261 (H) 10/05/2017 0846   HDL 46 10/05/2017 0846   CHOLHDL 4.1 10/05/2017 0846   CHOLHDL 4.7 06/26/2017 0255   VLDL 40 06/26/2017 0255   LDLCALC 89 10/05/2017  0846     Wt Readings from Last 3 Encounters:  01/13/18 123 lb (55.8 kg)  12/23/17 134 lb (60.8 kg)  11/27/17 154 lb 1.6 oz (69.9 kg)     Other studies Reviewed: Additional studies/ records that were reviewed today include: . Review of the above records demonstrates:   Assessment and Plan:   1. CAD without  angina: No chest pain. She has severe, diffuse calcific CAD and is not a candidate for CABG. Her anatomy is not favorable for PCI. Her calcified aorta is a contraindication for CABG. Continue ASA, Plavix, beta blocker, Imdur and statin.      2. Chronic systolic CHF/Ischemic cardiomyopathy: LVEF=20-25% by echo in June 2019. She has advanced CAD that is not favorable for CABG or PCI. ICD in place. No ace-inh or ARB due to renal insufficiency. Her weight is stable today. No sign of volume overload. Continue beta blocker, Lasix and Imdur.    3. Tobacco abuse, in remission: She stopped smoking.   4. PAD:She has LE disease, renal artery stenosis and bilateral carotid artery disease. This is followed in the Vascular surgery office.   Current medicines are reviewed at length with the patient today.  The patient does not have concerns regarding medicines.  The following changes have been made:  no change  Labs/ tests ordered today include:   No orders of the defined types were placed in this encounter.  Disposition:   FU with me in 6 months  Signed, [name], MD 02/09/2018 7:40 AM    [facility] [place], [place], [place]  [address] Phone: [phone]; Fax: [phone]

## 2018-02-10 ENCOUNTER — Encounter: Payer: Self-pay | Admitting: Cardiovascular Disease

## 2018-02-10 DIAGNOSIS — N2581 Secondary hyperparathyroidism of renal origin: Secondary | ICD-10-CM | POA: Diagnosis not present

## 2018-02-10 DIAGNOSIS — N186 End stage renal disease: Secondary | ICD-10-CM | POA: Diagnosis not present

## 2018-02-11 DIAGNOSIS — N2581 Secondary hyperparathyroidism of renal origin: Secondary | ICD-10-CM | POA: Diagnosis not present

## 2018-02-11 DIAGNOSIS — N186 End stage renal disease: Secondary | ICD-10-CM | POA: Diagnosis not present

## 2018-02-12 DIAGNOSIS — N2581 Secondary hyperparathyroidism of renal origin: Secondary | ICD-10-CM | POA: Diagnosis not present

## 2018-02-12 DIAGNOSIS — N186 End stage renal disease: Secondary | ICD-10-CM | POA: Diagnosis not present

## 2018-02-13 DIAGNOSIS — N186 End stage renal disease: Secondary | ICD-10-CM | POA: Diagnosis not present

## 2018-02-13 DIAGNOSIS — N2581 Secondary hyperparathyroidism of renal origin: Secondary | ICD-10-CM | POA: Diagnosis not present

## 2018-02-14 ENCOUNTER — Ambulatory Visit (INDEPENDENT_AMBULATORY_CARE_PROVIDER_SITE_OTHER): Payer: Medicare HMO

## 2018-02-14 DIAGNOSIS — I5022 Chronic systolic (congestive) heart failure: Secondary | ICD-10-CM

## 2018-02-14 DIAGNOSIS — I255 Ischemic cardiomyopathy: Secondary | ICD-10-CM | POA: Diagnosis not present

## 2018-02-14 DIAGNOSIS — N2581 Secondary hyperparathyroidism of renal origin: Secondary | ICD-10-CM | POA: Diagnosis not present

## 2018-02-14 DIAGNOSIS — N186 End stage renal disease: Secondary | ICD-10-CM | POA: Diagnosis not present

## 2018-02-15 ENCOUNTER — Encounter: Payer: Self-pay | Admitting: Internal Medicine

## 2018-02-15 ENCOUNTER — Ambulatory Visit (INDEPENDENT_AMBULATORY_CARE_PROVIDER_SITE_OTHER): Payer: Medicare HMO | Admitting: Internal Medicine

## 2018-02-15 VITALS — BP 104/72 | HR 64 | Ht 66.0 in | Wt 124.2 lb

## 2018-02-15 DIAGNOSIS — R948 Abnormal results of function studies of other organs and systems: Secondary | ICD-10-CM | POA: Diagnosis not present

## 2018-02-15 DIAGNOSIS — R911 Solitary pulmonary nodule: Secondary | ICD-10-CM

## 2018-02-15 DIAGNOSIS — N2581 Secondary hyperparathyroidism of renal origin: Secondary | ICD-10-CM | POA: Diagnosis not present

## 2018-02-15 DIAGNOSIS — R197 Diarrhea, unspecified: Secondary | ICD-10-CM

## 2018-02-15 DIAGNOSIS — N186 End stage renal disease: Secondary | ICD-10-CM | POA: Diagnosis not present

## 2018-02-15 DIAGNOSIS — R634 Abnormal weight loss: Secondary | ICD-10-CM | POA: Diagnosis not present

## 2018-02-15 NOTE — Patient Instructions (Signed)
You have been scheduled for a colonoscopy. Please follow written instructions given to you at your visit today.  Please pick up your prep supplies at the pharmacy within the next 1-3 days. If you use inhalers (even only as needed), please bring them with you on the day of your procedure.   I appreciate the opportunity to care for you. Carl Gessner, MD, FACG 

## 2018-02-15 NOTE — Progress Notes (Signed)
Remote ICD transmission.   

## 2018-02-15 NOTE — Progress Notes (Addendum)
Kristen Bradley 73 y.o. 1945/05/17 [id number] Referred by: Kristen Oh, MD Assessment & Plan:   Encounter Diagnoses  Name Primary?  . Diarrhea, unspecified type Yes  . Loss of weight   . Abnormal PET scan of colon   . Nodule of lower lobe of right lung   Because of these problems not entirely clear to me, she could have some sort of inflammatory bowel disease and loose protein that way and perhaps that would correlate with the findings on the CT and PET scans.  Malignancy is possible as well.  The pattern of pain and diarrhea is not entirely consistent with mesenteric ischemia though I think she is at risk for that, given her vascular disease.  A paraneoplastic syndrome is possible, the PET scan suggested slight enlargement of the lung lesion thought most likely to be benign but they were not sure.  Colonoscopy to investigate these problems is appropriate.  It needs to be done at the hospital due to her cardiomyopathy and low ejection fraction, there is a better safety net there.  The risks and benefits as well as alternatives of endoscopic procedure(s) have been discussed and reviewed. All questions answered. The patient agrees to proceed.  She is at increased risk of problems due to her comorbidities.  She says she cannot take a colonoscopy prep and is unwilling to consider any type of routine or standard preparation.  Thus she will start clear liquids the day before her colonoscopy and take 4 Dulcolax in the morning afternoon and evening before her procedure and have 2 saline enemas the morning of.  She understands this may not produce an adequate prep.   The patient tells that she is off her clopidogrel although I cannot see documentation in the medical records available to me that support this.  I will check with her vascular surgeon, Dr. Donzetta Matters about this.  I will get copies of her previous evaluation at [facility] gastroenterology. 03/06/2018 update EGD/colonoscopy below  09/2013 EGD -  reactive gastropathy - no H pylori Colonoscopy 6 tubular adenomas max 15 mm  May need a repeat PET scan.  I will send copies to Dr. [name] and Dr. Edrick Bradley  Subjective:   Chief Complaint: Diarrhea and weight loss  HPI This is a 73 year old African-American woman who says that after starting chronic ambulatory peritoneal dialysis in 2019 she has had diarrhea.  She uses the dialysis equipment overnight and then has 5-10 softer watery stools a day.  It started after that and is been chronic and has been associated with a drop in albumin and a drop in weight.  She has had some vague abdominal cramping.  When she takes Imodium it helps and allows her to leave the house.  She has not had any bleeding, there is been no fecal incontinence.  She recalls having a colonoscopy "many years ago", presumably by Kristen Bradley, but does not know the results. She has been losing weight as documented below.  She has some fatigue.  Wt Readings from Last 3 Encounters:  02/15/18 124 lb 4 oz (56.4 kg)  01/13/18 123 lb (55.8 kg)  12/23/17 134 lb (60.8 kg)   In the past year she has had imaging studies as below.  CT Abd/Pelvis w/o contrast 05/2017 IMPRESSION: 1. Mild sigmoid diverticulosis. Mild eccentric wall thickening with adjacent fat stranding in the sigmoid colon, which may indicate a mild sigmoid diverticulitis. No free air. No abscess. 2. Moderate colonic stool volume, suggesting constipation. No bowel obstruction.  3. Irregular solid 1.4 cm peripheral right lower lobe pulmonary nodule, increased in size since 2015 chest CT, cannot exclude primary bronchogenic carcinoma. Recommend further characterization with PET-CT. 4. Multiple small indeterminate renal cortical masses, a few of which have increased in size since 2015 CT, largest 1.6 cm in the anterior lower right kidney. These could represent complex renal cysts or renal cell carcinomas. If the patient's renal function improves (GFR  greater than 30), further characterization would be recommended with CT abdomen without and with IV contrast (MRI is presumably not an option given the ICD). If the patient's renal function continues to preclude administration of IV contrast, surveillance non-contrast CT abdomen is probably the best option in 6 months. 5. Enlarging 3.8 cm infrarenal abdominal aortic aneurysm. Recommend followup by ultrasound in 2 years. This recommendation follows ACR consensus guidelines: White Paper of the ACR Incidental Findings Committee II on Vascular Findings. J Am Coll Radiol 2013; 10:789-794. 6.  Aortic Atherosclerosis (ICD10-I70.0).  Coronary atherosclerosis.  PET scan 06/17/2017 IMPRESSION: 1. The right lower lobe nodule is blurred by motion artifact and has slightly indistinct nodules but measures about 1.0 by 0.6. Maximum standard uptake value is 1.2. Although the maximum SUV is somewhat reassuring, low-grade adenocarcinoma is not excluded, and the probable increase in size compared to 2015 is still concerning. At a minimum, surveillance by chest CT is likely warranted. 2. Again observed is a bandlike density adjacent to the sigmoid colon in the upper pelvis with low-grade metabolic activity, maximum SUV 3.6. This has an appearance suggesting postinflammatory findings related to prior diverticulitis. Strictly speaking, a carcinoid tumor or local colon cancer could have a similar appearance, and I would recommend correlation with colon cancer screening and continued surveillance. 3. Other imaging findings of potential clinical significance: Aortic Atherosclerosis (ICD10-I70.0). Coronary atherosclerosis. Chronic saccular outpouching of the aortic arch. Aortic aneurysm NOS (ICD10-I71.9). Abdominal aortic aneurysm 3.6 cm at its widest. This is stable. Postoperative findings along the pancreatic tail and spleen. Mild-to-moderate cardiomegaly. Bilateral renal lesions of varying complexity, most  likely to be cysts although not technically specific in some cases. Descending and sigmoid colon diverticulosis. Lumbar spondylosis and degenerative disc disease.   Electronically Signed   By: Van Clines M.D.   On: 06/17/2017 08:58  Labs from November 2019 sent by nephrology Hemoglobin 8.9 MCV 89 platelets 194 white count 12.17 Creatinine 3.14 BUN 92 Total protein 4.8 albumin 2.6 Iron saturation 14% with a low TIBC of 166  Allergies  Allergen Reactions  . Chlorhexidine Gluconate Itching  . Ace Inhibitors Cough  . Dilaudid [Hydromorphone Hcl] Itching   Current Meds  Medication Sig  . acetaminophen (TYLENOL) 500 MG tablet Take 1,000 mg by mouth every 6 (six) hours as needed for mild pain or headache.   Marland Kitchen aspirin EC 81 MG tablet Take 1 tablet (81 mg total) by mouth daily.  . carvedilol (COREG) 6.25 MG tablet Take 1 tablet (6.25 mg total) by mouth 2 (two) times daily with a meal.  . furosemide (LASIX) 80 MG tablet TAKE 1 TABLET BY MOUTH TWICE DAILY (Patient taking differently: Take 80 mg by mouth daily. )  . glipiZIDE (GLUCOTROL) 5 MG tablet Take 5 mg by mouth daily before breakfast.   . nitroGLYCERIN (NITROSTAT) 0.4 MG SL tablet Place 1 tablet (0.4 mg total) under the tongue every 5 (five) minutes as needed for chest pain.   Past Medical History:  Diagnosis Date  . 3-vessel CAD - treated medically  02/26/2016  . Acute renal failure  superimposed on stage 3 chronic kidney disease ([place])   . Adrenal tumor 08/2007   surgery  . Aftercare following surgery of the circulatory system, [place] 05/26/2012  . AKI (acute kidney injury) ([place])   . Atherosclerosis of native artery of extremity with intermittent claudication ([place]) 01/26/2013  . CAD, multiple vessel   . Cancer of right breast [facility])    s/p lumpectomy, XRT  . Cardiomyopathy, ischemic   . Carotid artery disease (HCC)    s/p bilateral CEA  . Carotid stenosis, bilateral-s/p Bilateral CEA 11/05/2011  . Chronic ITP (idiopathic  thrombocytopenia) (HCC) 02/24/2016  . Chronic systolic (congestive) heart failure ([place]) 11/05/2016  . Chronic systolic heart failure ([place]) 03/03/2016  . CKD (chronic kidney disease) stage 4, GFR 15-29 ml/min (HCC)   . Dyspnea   . Elevated brain natriuretic peptide (BNP) level 01/30/2016  . Elevated troponin I level 01/30/2016  . Hepatitis C    "got treatment for it" (01/29/2016)  . HFrEF (heart failure with reduced ejection fraction) ([place])   . High cholesterol    takes Lopid daily  . History of UTI    takes Diflucan dail--pt states no uti in a couple of yrs though  . Hypertension    takes Losartan,Amlodipine,HCTZ,and Atenolol daily and Clatarpres  . Intermittent claudication ([place]) 11/05/2011  . Ischemic cardiomyopathy    a. 11/2016 s/p MDT BiV ICD w/ his bundle pacing.  . Joint pain    legs  . LBBB- new 01/30/2016  . NSTEMI (non-ST elevated myocardial infarction) ([place])   . Occlusion and stenosis of carotid artery without mention of cerebral infarction 12/17/2011  . PAD (peripheral artery disease) ([place]) 11/26/2011  . Pain in limb 09/08/2012  . Peripheral vascular disease ([place]) 11/05/2011  . Renal artery stenosis (HCC)    Right renal artery stent 05/31/06  . Renal failure syndrome   . Sinus bradycardia   . Sinus pause   . Stroke [facility]) Jan. 7, 2014  . Thrombocytopenia- plts 81K 01/30/2016  . Tobacco abuse   . Type 2 diabetes mellitus with vascular disease ([place])   . Type II diabetes mellitus ([place])    Past Surgical History:  Procedure Laterality Date  . ABDOMINAL AORTOGRAM W/LOWER EXTREMITY N/A 11/21/2017   Procedure: ABDOMINAL AORTOGRAM W/LOWER EXTREMITY;  Surgeon: Waynetta Sandy, MD;  Location: [place] [facility];  Service: Cardiovascular;  Laterality: N/A;  . ABDOMINAL HYSTERECTOMY    . ADRENAL GLAND SURGERY  2009  . BIV ICD INSERTION CRT-D N/A 11/05/2016   Procedure: BIV ICD INSERTION CRT-D;  Surgeon: Evans Lance, MD;  Location: [place] CV [facility];  Service:  Cardiovascular;  Laterality: N/A;  . BREAST LUMPECTOMY Right 2009  . CARDIAC CATHETERIZATION N/A 02/03/2016   Procedure: Right/Left Heart Cath and Coronary Angiography;  Surgeon: Burnell Blanks, MD;  Location: [place] CV LAB;  Service: Cardiovascular;  Laterality: N/A;  . EMBOLECTOMY Right 11/25/2017   Procedure: ILIAC EMBOLECTOMY;  Surgeon: Marty Heck, MD;  Location: [place];  Service: Vascular;  Laterality: Right;  . ENDARTERECTOMY  01/05/2012   Procedure: ENDARTERECTOMY CAROTID;  Surgeon: Conrad Aurora, MD;  Location: [place];  Service: Vascular;  Laterality: Right;  Right Carotid Artery Endarterectomy, right stump pressure, intraoperative ultrasound  . ENDARTERECTOMY  02/08/2012   Procedure: ENDARTERECTOMY CAROTID;  Surgeon: Conrad Redmond, MD;  Location: [place];  Service: Vascular;  Laterality: Left;  . ENDARTERECTOMY FEMORAL Right 11/25/2017   Procedure: RIGHT FEMORAL ENDARTERECTOMY WITH PROFUNOPLASTY AND BOVINE PATCH ANGIOPLAST RIGHT ILIOFEMORAL;  RIGHT ILIAC THROMBOEMBOLECTOMY;  Surgeon: Marty Heck, MD;  Location: [place];  Service: Vascular;  Laterality: Right;  . LUMBAR DISC SURGERY    . PATCH ANGIOPLASTY  02/08/2012   Procedure: PATCH ANGIOPLASTY;  Surgeon: Conrad Ollie, MD;  Location[facility];  Service: Vascular;  Laterality: Left;  Carotid artery patch angioplasty using Vascu-Guard bovine patch 1cm x 6cm.  Marland Kitchen PERIPHERAL VASCULAR INTERVENTION Bilateral 11/21/2017   Procedure: PERIPHERAL VASCULAR INTERVENTION;  Surgeon: Waynetta Sandy, MD;  Location: [place] CV LAB;  Service: Cardiovascular;  Laterality: Bilateral;  Iliac Stents  . RENAL ARTERY STENT Right 05/2006   Social History   Social History Narrative   Lives with female friend   Has at least 17 son   Former smoker   No drugs or EtOH   family history includes Cancer in her father and mother; Diabetes in her brother and sister; Hypertension in her brother and sister.   Review of Systems As per HPI  all other review of systems appear negative  Objective:   Physical Exam @BP  104/72   Pulse 64   Ht 5\' 6"  (1.676 m)   Wt 124 lb 4 oz (56.4 kg)   BMI 20.05 kg/m @  General:  Chronically ill and in no acute distress Eyes:  anicteric. Neck:   supple w/o thyromegaly or mass.  Lungs: Clear to auscultation bilaterally. Heart:   S1S2, no rubs, murmurs, gallops - distant. Abdomen:  soft, PD catheter in L UQ area, mildly tender LLQ, no hepatosplenomegaly, hernia, or mass and BS+.  Rectal: Deferred until colonoscopy Extremities:   no edema, cyanosis or clubbing Skin   no rash. Neuro:  A&O x 3.  Psych:  appropriate mood and  Affect.   Data Reviewed:  See HPI

## 2018-02-16 DIAGNOSIS — N186 End stage renal disease: Secondary | ICD-10-CM | POA: Diagnosis not present

## 2018-02-16 DIAGNOSIS — N2581 Secondary hyperparathyroidism of renal origin: Secondary | ICD-10-CM | POA: Diagnosis not present

## 2018-02-17 ENCOUNTER — Telehealth: Payer: Self-pay

## 2018-02-17 DIAGNOSIS — N2581 Secondary hyperparathyroidism of renal origin: Secondary | ICD-10-CM | POA: Diagnosis not present

## 2018-02-17 DIAGNOSIS — N186 End stage renal disease: Secondary | ICD-10-CM | POA: Diagnosis not present

## 2018-02-17 NOTE — Telephone Encounter (Signed)
I reached Kristen Bradley by phone and she said she called yesterday for a refill on her generic plavix and she will start back taking that. We went over about when to hold it for her upcoming colonoscopy on 03/14/2018 at [place]. We also went over her instructions again and about using the 2 saline enema's an hour before arriving the day of the procedure. She will call back with any questions she has.

## 2018-02-17 NOTE — Telephone Encounter (Signed)
-----   Message from Gatha Mayer, MD sent at 02/17/2018  8:58 AM EST ----- Regarding: FW: On Plavix? See what Dr. Donzetta Matters said re: Plavix  She should be on it AND she can/should hold it 5 d before colonoscopy  ----- Message ----- From: Waynetta Sandy, MD Sent: 02/16/2018   8:17 AM EST To: Gatha Mayer, MD Subject: RE: On Plavix?                                 Glendell Docker- she should be on plavix but certainly ok to hold for colonoscopy at this point. She can contact our office if she needs plavix refilled.   Erlene Quan ----- Message ----- From: Gatha Mayer, MD Sent: 02/15/2018   1:57 PM EST To: Waynetta Sandy, MD Subject: On Plavix?                                     I saw this lady for diarrhea and weight loss and she told her she was off her Plavix but could not tell us why.  Should she go back on it?  Seems likely.  If so I presume I can hold it 5 days before a colonoscopy in February.  Thanks  Glendell Docker

## 2018-02-18 DIAGNOSIS — N2581 Secondary hyperparathyroidism of renal origin: Secondary | ICD-10-CM | POA: Diagnosis not present

## 2018-02-18 DIAGNOSIS — N186 End stage renal disease: Secondary | ICD-10-CM | POA: Diagnosis not present

## 2018-02-18 LAB — CUP PACEART REMOTE DEVICE CHECK
Battery Remaining Longevity: 68 mo
Battery Voltage: 2.96 V
Brady Statistic AP VP Percent: 5.56 %
Brady Statistic AP VS Percent: 0.01 %
Brady Statistic AS VP Percent: 92.62 %
Brady Statistic AS VS Percent: 1.8 %
Brady Statistic RA Percent Paced: 5.47 %
Brady Statistic RV Percent Paced: 95.2 %
Date Time Interrogation Session: 20200114072824
HighPow Impedance: 71 Ohm
Implantable Lead Implant Date: 20181005
Implantable Lead Implant Date: 20181005
Implantable Lead Implant Date: 20181005
Implantable Lead Location: 753859
Implantable Lead Location: 753860
Implantable Lead Location: 753860
Implantable Lead Model: 3830
Implantable Lead Model: 5076
Implantable Lead Model: 6935
Implantable Pulse Generator Implant Date: 20181005
Lead Channel Impedance Value: 247 Ohm
Lead Channel Impedance Value: 342 Ohm
Lead Channel Impedance Value: 342 Ohm
Lead Channel Impedance Value: 361 Ohm
Lead Channel Impedance Value: 475 Ohm
Lead Channel Impedance Value: 551 Ohm
Lead Channel Pacing Threshold Amplitude: 0.75 V
Lead Channel Pacing Threshold Pulse Width: 0.4 ms
Lead Channel Sensing Intrinsic Amplitude: 31.625 mV
Lead Channel Sensing Intrinsic Amplitude: 31.625 mV
Lead Channel Sensing Intrinsic Amplitude: 4 mV
Lead Channel Sensing Intrinsic Amplitude: 4 mV
Lead Channel Setting Pacing Amplitude: 0.5 V
Lead Channel Setting Pacing Amplitude: 1.5 V
Lead Channel Setting Pacing Amplitude: 2.5 V
Lead Channel Setting Pacing Pulse Width: 0.03 ms
Lead Channel Setting Pacing Pulse Width: 1 ms
Lead Channel Setting Sensing Sensitivity: 0.3 mV

## 2018-02-19 DIAGNOSIS — N186 End stage renal disease: Secondary | ICD-10-CM | POA: Diagnosis not present

## 2018-02-19 DIAGNOSIS — N2581 Secondary hyperparathyroidism of renal origin: Secondary | ICD-10-CM | POA: Diagnosis not present

## 2018-02-20 DIAGNOSIS — N2581 Secondary hyperparathyroidism of renal origin: Secondary | ICD-10-CM | POA: Diagnosis not present

## 2018-02-20 DIAGNOSIS — N186 End stage renal disease: Secondary | ICD-10-CM | POA: Diagnosis not present

## 2018-02-21 DIAGNOSIS — N2581 Secondary hyperparathyroidism of renal origin: Secondary | ICD-10-CM | POA: Diagnosis not present

## 2018-02-21 DIAGNOSIS — N186 End stage renal disease: Secondary | ICD-10-CM | POA: Diagnosis not present

## 2018-02-22 ENCOUNTER — Other Ambulatory Visit: Payer: Self-pay | Admitting: Nephrology

## 2018-02-22 ENCOUNTER — Other Ambulatory Visit (HOSPITAL_COMMUNITY): Payer: Self-pay | Admitting: Nephrology

## 2018-02-22 DIAGNOSIS — C649 Malignant neoplasm of unspecified kidney, except renal pelvis: Secondary | ICD-10-CM

## 2018-02-22 DIAGNOSIS — N186 End stage renal disease: Secondary | ICD-10-CM | POA: Diagnosis not present

## 2018-02-22 DIAGNOSIS — N2581 Secondary hyperparathyroidism of renal origin: Secondary | ICD-10-CM | POA: Diagnosis not present

## 2018-02-23 ENCOUNTER — Other Ambulatory Visit (HOSPITAL_COMMUNITY): Payer: Self-pay | Admitting: Nephrology

## 2018-02-23 DIAGNOSIS — N186 End stage renal disease: Secondary | ICD-10-CM | POA: Diagnosis not present

## 2018-02-23 DIAGNOSIS — N2581 Secondary hyperparathyroidism of renal origin: Secondary | ICD-10-CM | POA: Diagnosis not present

## 2018-02-23 DIAGNOSIS — R911 Solitary pulmonary nodule: Secondary | ICD-10-CM

## 2018-02-24 DIAGNOSIS — N2581 Secondary hyperparathyroidism of renal origin: Secondary | ICD-10-CM | POA: Diagnosis not present

## 2018-02-24 DIAGNOSIS — N186 End stage renal disease: Secondary | ICD-10-CM | POA: Diagnosis not present

## 2018-02-25 DIAGNOSIS — N186 End stage renal disease: Secondary | ICD-10-CM | POA: Diagnosis not present

## 2018-02-25 DIAGNOSIS — N2581 Secondary hyperparathyroidism of renal origin: Secondary | ICD-10-CM | POA: Diagnosis not present

## 2018-02-26 DIAGNOSIS — N2581 Secondary hyperparathyroidism of renal origin: Secondary | ICD-10-CM | POA: Diagnosis not present

## 2018-02-26 DIAGNOSIS — N186 End stage renal disease: Secondary | ICD-10-CM | POA: Diagnosis not present

## 2018-02-27 DIAGNOSIS — N186 End stage renal disease: Secondary | ICD-10-CM | POA: Diagnosis not present

## 2018-02-27 DIAGNOSIS — N2581 Secondary hyperparathyroidism of renal origin: Secondary | ICD-10-CM | POA: Diagnosis not present

## 2018-02-28 ENCOUNTER — Ambulatory Visit: Payer: Medicare HMO | Admitting: Internal Medicine

## 2018-02-28 DIAGNOSIS — N186 End stage renal disease: Secondary | ICD-10-CM | POA: Diagnosis not present

## 2018-02-28 DIAGNOSIS — N2581 Secondary hyperparathyroidism of renal origin: Secondary | ICD-10-CM | POA: Diagnosis not present

## 2018-03-01 DIAGNOSIS — N186 End stage renal disease: Secondary | ICD-10-CM | POA: Diagnosis not present

## 2018-03-01 DIAGNOSIS — N2581 Secondary hyperparathyroidism of renal origin: Secondary | ICD-10-CM | POA: Diagnosis not present

## 2018-03-02 ENCOUNTER — Ambulatory Visit: Payer: Medicare HMO | Admitting: Internal Medicine

## 2018-03-02 ENCOUNTER — Encounter: Payer: Self-pay | Admitting: Internal Medicine

## 2018-03-02 VITALS — BP 116/66 | HR 74 | Ht 66.0 in | Wt 128.0 lb

## 2018-03-02 DIAGNOSIS — I5022 Chronic systolic (congestive) heart failure: Secondary | ICD-10-CM | POA: Diagnosis not present

## 2018-03-02 DIAGNOSIS — N2581 Secondary hyperparathyroidism of renal origin: Secondary | ICD-10-CM | POA: Diagnosis not present

## 2018-03-02 DIAGNOSIS — I2510 Atherosclerotic heart disease of native coronary artery without angina pectoris: Secondary | ICD-10-CM

## 2018-03-02 DIAGNOSIS — N186 End stage renal disease: Secondary | ICD-10-CM | POA: Diagnosis not present

## 2018-03-02 DIAGNOSIS — Z95810 Presence of automatic (implantable) cardiac defibrillator: Secondary | ICD-10-CM | POA: Diagnosis not present

## 2018-03-02 DIAGNOSIS — I255 Ischemic cardiomyopathy: Secondary | ICD-10-CM

## 2018-03-02 LAB — CUP PACEART INCLINIC DEVICE CHECK
Date Time Interrogation Session: 20200130121410
Implantable Lead Implant Date: 20181005
Implantable Lead Implant Date: 20181005
Implantable Lead Location: 753859
Implantable Lead Location: 753860
Implantable Lead Location: 753860
Implantable Lead Model: 5076
Implantable Pulse Generator Implant Date: 20181005
MDC IDC LEAD IMPLANT DT: 20181005

## 2018-03-02 NOTE — Patient Instructions (Signed)
Medication Instructions:  Your physician recommends that you continue on your current medications as directed. Please refer to the Current Medication list given to you today.  Labwork: You will get lab work today:  Lipid panel  Testing/Procedures: None ordered.  Follow-Up: Your physician wants you to follow-up in: one year with Dr. Lovena Le.   You will receive a reminder letter in the mail two months in advance. If you don't receive a letter, please call our office to schedule the follow-up appointment.  Remote monitoring is used to monitor your ICD from home. This monitoring reduces the number of office visits required to check your device to one time per year. It allows [place] to keep an eye on the functioning of your device to ensure it is working properly. You are scheduled for a device check from home on 05/16/2018. You may send your transmission at any time that day. If you have a wireless device, the transmission will be sent automatically. After your physician reviews your transmission, you will receive a postcard with your next transmission date.  Any Other Special Instructions Will Be Listed Below (If Applicable).  If you need a refill on your cardiac medications before your next appointment, please call your pharmacy.

## 2018-03-02 NOTE — Progress Notes (Signed)
HPI Mrs. Dike returns today for evaluation of her ICD. She is a pleasant 73 yo woman with a h/o ICM, s/p MI, CAD, LBBB, chronic systolic heart failure who underwent BiV ICD insertion with a His bundle lead placed back in October 2018. She has done well in the interim. Her QRS narrowed nicely. She denies chest pain, sob, or edema. No ICD shock. She denies ICD shock. No syncope. Her CHF symptoms are class 2. Allergies  Allergen Reactions  . Chlorhexidine Gluconate Itching  . Ace Inhibitors Cough  . Dilaudid [Hydromorphone Hcl] Itching     Current Outpatient Medications  Medication Sig Dispense Refill  . acetaminophen (TYLENOL) 500 MG tablet Take 1,000 mg by mouth every 6 (six) hours as needed for mild pain or headache.     Marland Kitchen aspirin EC 81 MG tablet Take 1 tablet (81 mg total) by mouth daily. 2 tablet 0  . carvedilol (COREG) 6.25 MG tablet Take 1 tablet (6.25 mg total) by mouth 2 (two) times daily with a meal. 60 tablet 6  . furosemide (LASIX) 80 MG tablet TAKE 1 TABLET BY MOUTH TWICE DAILY (Patient taking differently: Take 80 mg by mouth daily. ) 180 tablet 3  . glipiZIDE (GLUCOTROL) 5 MG tablet Take 5 mg by mouth daily before breakfast.     . isosorbide mononitrate (IMDUR) 60 MG 24 hr tablet Take 1 tablet (60 mg total) by mouth daily. 30 tablet 3  . nitroGLYCERIN (NITROSTAT) 0.4 MG SL tablet Place 1 tablet (0.4 mg total) under the tongue every 5 (five) minutes as needed for chest pain. 25 tablet 3   No current facility-administered medications for this visit.      Past Medical History:  Diagnosis Date  . 3-vessel CAD - treated medically  02/26/2016  . Acute renal failure superimposed on stage 3 chronic kidney disease ([place])   . Adrenal tumor 08/2007   surgery  . Aftercare following surgery of the circulatory system, [place] 05/26/2012  . AKI (acute kidney injury) ([place])   . Atherosclerosis of native artery of extremity with intermittent claudication ([place]) 01/26/2013  . CAD,  multiple vessel   . Cancer of right breast [facility]    s/p lumpectomy, XRT  . Cardiomyopathy, ischemic   . Carotid artery disease (HCC)    s/p bilateral CEA  . Carotid stenosis, bilateral-s/p Bilateral CEA 11/05/2011  . Chronic ITP (idiopathic thrombocytopenia) (HCC) 02/24/2016  . Chronic systolic (congestive) heart failure ([place]) 11/05/2016  . Chronic systolic heart failure ([place]) 03/03/2016  . CKD (chronic kidney disease) stage 4, GFR 15-29 ml/min (HCC)   . Dyspnea   . Elevated brain natriuretic peptide (BNP) level 01/30/2016  . Elevated troponin I level 01/30/2016  . Hepatitis C    "got treatment for it" (01/29/2016)  . HFrEF (heart failure with reduced ejection fraction) ([place])   . High cholesterol    takes Lopid daily  . History of UTI    takes Diflucan dail--pt states no uti in a couple of yrs though  . Hypertension    takes Losartan,Amlodipine,HCTZ,and Atenolol daily and Clatarpres  . Intermittent claudication ([place]) 11/05/2011  . Ischemic cardiomyopathy    a. 11/2016 s/p MDT BiV ICD w/ his bundle pacing.  . Joint pain    legs  . LBBB- new 01/30/2016  . NSTEMI (non-ST elevated myocardial infarction) ([place])   . Occlusion and stenosis of carotid artery without mention of cerebral infarction 12/17/2011  . PAD (peripheral artery disease) ([place]) 11/26/2011  . Pain  in limb 09/08/2012  . Peripheral vascular disease ([place]) 11/05/2011  . Renal artery stenosis (HCC)    Right renal artery stent 05/31/06  . Renal failure syndrome   . Sinus bradycardia   . Sinus pause   . Stroke [facility]) Jan. 7, 2014  . Thrombocytopenia- plts 81K 01/30/2016  . Tobacco abuse   . Type 2 diabetes mellitus with vascular disease ([place])   . Type II diabetes mellitus (HCC)     ROS:   All systems reviewed and negative except as noted in the HPI.   Past Surgical History:  Procedure Laterality Date  . ABDOMINAL AORTOGRAM W/LOWER EXTREMITY N/A 11/21/2017   Procedure: ABDOMINAL AORTOGRAM W/LOWER EXTREMITY;  Surgeon:  Waynetta Sandy, MD;  Location: [place] [facility];  Service: Cardiovascular;  Laterality: N/A;  . ABDOMINAL HYSTERECTOMY    . ADRENAL GLAND SURGERY  2009  . BIV ICD INSERTION CRT-D N/A 11/05/2016   Procedure: BIV ICD INSERTION CRT-D;  Surgeon: Evans Lance, MD;  Location: [place] [facility];  Service: Cardiovascular;  Laterality: N/A;  . BREAST LUMPECTOMY Right 2009  . CARDIAC CATHETERIZATION N/A 02/03/2016   Procedure: Right/Left Heart Cath and Coronary Angiography;  Surgeon: Burnell Blanks, MD;  Location: [place] [facility];  Service: Cardiovascular;  Laterality: N/A;  . EMBOLECTOMY Right 11/25/2017   Procedure: ILIAC EMBOLECTOMY;  Surgeon: Marty Heck, MD;  Location: [place];  Service: Vascular;  Laterality: Right;  . ENDARTERECTOMY  01/05/2012   Procedure: ENDARTERECTOMY CAROTID;  Surgeon: Conrad [place], MD;  Location: [place];  Service: Vascular;  Laterality: Right;  Right Carotid Artery Endarterectomy, right stump pressure, intraoperative ultrasound  . ENDARTERECTOMY  02/08/2012   Procedure: ENDARTERECTOMY CAROTID;  Surgeon: Conrad [place], MD;  Location: [place];  Service: Vascular;  Laterality: Left;  . ENDARTERECTOMY FEMORAL Right 11/25/2017   Procedure: RIGHT FEMORAL ENDARTERECTOMY WITH PROFUNOPLASTY AND BOVINE PATCH ANGIOPLAST RIGHT ILIOFEMORAL; RIGHT ILIAC THROMBOEMBOLECTOMY;  Surgeon: Marty Heck, MD;  Location: [place];  Service: Vascular;  Laterality: Right;  . LUMBAR DISC SURGERY    . PATCH ANGIOPLASTY  02/08/2012   Procedure: PATCH ANGIOPLASTY;  Surgeon: Conrad , MD;  Location: [facility];  Service: Vascular;  Laterality: Left;  Carotid artery patch angioplasty using Vascu-Guard bovine patch 1cm x 6cm.  Marland Kitchen PERIPHERAL VASCULAR INTERVENTION Bilateral 11/21/2017   Procedure: PERIPHERAL VASCULAR INTERVENTION;  Surgeon: Waynetta Sandy, MD;  Location: [place] [facility];  Service: Cardiovascular;  Laterality: Bilateral;  Iliac Stents  . RENAL ARTERY  STENT Right 05/2006     Family History  Problem Relation Age of Onset  . Cancer Mother        ? type  . Cancer Father        ? type  . Diabetes Sister   . Hypertension Sister   . Diabetes Brother   . Hypertension Brother   . CAD Neg Hx      Social History   Socioeconomic History  . Marital status: Divorced    Spouse name: Not on file  . Number of children: 1  . Years of education: 10th  . Highest education level: Not on file  Occupational History  . Occupation: Retired-truck Education administrator: RETIRED  Social Needs  . Financial resource strain: Not on file  . Food insecurity:    Worry: Not on file    Inability: Not on file  . Transportation needs:    Medical: Not on file  Non-medical: Not on file  Tobacco Use  . Smoking status: Former Smoker    Packs/day: 0.50    Years: 53.00    Pack years: 26.50    Types: Cigarettes    Last attempt to quit: 03/2016    Years since quitting: 1.9  . Smokeless tobacco: Never Used  Substance and Sexual Activity  . Alcohol use: No    Alcohol/week: 0.0 standard drinks  . Drug use: No  . Sexual activity: Not on file  Lifestyle  . Physical activity:    Days per week: Not on file    Minutes per session: Not on file  . Stress: Not on file  Relationships  . Social connections:    Talks on phone: Not on file    Gets together: Not on file    Attends religious service: Not on file    Active member of club or organization: Not on file    Attends meetings of clubs or organizations: Not on file    Relationship status: Not on file  . Intimate partner violence:    Fear of current or ex partner: Not on file    Emotionally abused: Not on file    Physically abused: Not on file    Forced sexual activity: Not on file  Other Topics Concern  . Not on file  Social History Narrative   Lives with female friend   Has at least 1 son   Former smoker   No drugs or EtOH     BP 116/66   Pulse 74   Ht 5\' 6"  (1.676 m)   Wt 128 lb (58.1  kg)   SpO2 99%   BMI 20.66 kg/m   Physical Exam:  Well appearing NAD HEENT: Unremarkable Neck:  No JVD, no thyromegally Lymphatics:  No adenopathy Back:  No CVA tenderness Lungs:  Clear with no wheezes HEART:  Regular rate rhythm, no murmurs, no rubs, no clicks Abd:  soft, positive bowel sounds, no organomegally, no rebound, no guarding Ext:  2 plus pulses, no edema, no cyanosis, no clubbing Skin:  No rashes no nodules Neuro:  CN II through XII intact, motor grossly intact  EKG - nsr with His bundle pacing  DEVICE  Normal device function.  See PaceArt for details.   Assess/Plan: 1. Chronic systolic heart failure - she is euvolemic today and her symptoms are class 2. Her optivol has recently gone up though she is not symptomatic. I asked her to reduce her salt intake and not miss any of her meds. 2. ICD - her medtronic biv ICD is working normally. We will plan to reduce her His bundle LV output when we see her back if thresholds remain good. 3. CAD - she denies anginal symptoms.  4. Peripheral vascular disease - she is s/p bilateral CEA and iliac artery stenting who denies claudication. She is encouraged to increase her walking.  Mikle Bosworth.D.

## 2018-03-03 DIAGNOSIS — N186 End stage renal disease: Secondary | ICD-10-CM | POA: Diagnosis not present

## 2018-03-03 DIAGNOSIS — E1122 Type 2 diabetes mellitus with diabetic chronic kidney disease: Secondary | ICD-10-CM | POA: Diagnosis not present

## 2018-03-03 DIAGNOSIS — Z992 Dependence on renal dialysis: Secondary | ICD-10-CM | POA: Diagnosis not present

## 2018-03-03 DIAGNOSIS — N2581 Secondary hyperparathyroidism of renal origin: Secondary | ICD-10-CM | POA: Diagnosis not present

## 2018-03-04 DIAGNOSIS — N186 End stage renal disease: Secondary | ICD-10-CM | POA: Diagnosis not present

## 2018-03-04 DIAGNOSIS — N2581 Secondary hyperparathyroidism of renal origin: Secondary | ICD-10-CM | POA: Diagnosis not present

## 2018-03-05 DIAGNOSIS — N186 End stage renal disease: Secondary | ICD-10-CM | POA: Diagnosis not present

## 2018-03-05 DIAGNOSIS — N2581 Secondary hyperparathyroidism of renal origin: Secondary | ICD-10-CM | POA: Diagnosis not present

## 2018-03-06 DIAGNOSIS — N2581 Secondary hyperparathyroidism of renal origin: Secondary | ICD-10-CM | POA: Diagnosis not present

## 2018-03-06 DIAGNOSIS — N186 End stage renal disease: Secondary | ICD-10-CM | POA: Diagnosis not present

## 2018-03-07 DIAGNOSIS — N186 End stage renal disease: Secondary | ICD-10-CM | POA: Diagnosis not present

## 2018-03-07 DIAGNOSIS — N2581 Secondary hyperparathyroidism of renal origin: Secondary | ICD-10-CM | POA: Diagnosis not present

## 2018-03-08 ENCOUNTER — Telehealth: Payer: Self-pay | Admitting: Internal Medicine

## 2018-03-08 DIAGNOSIS — N186 End stage renal disease: Secondary | ICD-10-CM | POA: Diagnosis not present

## 2018-03-08 DIAGNOSIS — N2581 Secondary hyperparathyroidism of renal origin: Secondary | ICD-10-CM | POA: Diagnosis not present

## 2018-03-08 NOTE — Telephone Encounter (Signed)
Patient notified to perform the enema's 1 hour prior to leaving for her procedure.

## 2018-03-09 DIAGNOSIS — N186 End stage renal disease: Secondary | ICD-10-CM | POA: Diagnosis not present

## 2018-03-09 DIAGNOSIS — N2581 Secondary hyperparathyroidism of renal origin: Secondary | ICD-10-CM | POA: Diagnosis not present

## 2018-03-10 DIAGNOSIS — N186 End stage renal disease: Secondary | ICD-10-CM | POA: Diagnosis not present

## 2018-03-10 DIAGNOSIS — N2581 Secondary hyperparathyroidism of renal origin: Secondary | ICD-10-CM | POA: Diagnosis not present

## 2018-03-11 DIAGNOSIS — N186 End stage renal disease: Secondary | ICD-10-CM | POA: Diagnosis not present

## 2018-03-11 DIAGNOSIS — N2581 Secondary hyperparathyroidism of renal origin: Secondary | ICD-10-CM | POA: Diagnosis not present

## 2018-03-12 DIAGNOSIS — N186 End stage renal disease: Secondary | ICD-10-CM | POA: Diagnosis not present

## 2018-03-12 DIAGNOSIS — N2581 Secondary hyperparathyroidism of renal origin: Secondary | ICD-10-CM | POA: Diagnosis not present

## 2018-03-13 DIAGNOSIS — N186 End stage renal disease: Secondary | ICD-10-CM | POA: Diagnosis not present

## 2018-03-13 DIAGNOSIS — N2581 Secondary hyperparathyroidism of renal origin: Secondary | ICD-10-CM | POA: Diagnosis not present

## 2018-03-14 ENCOUNTER — Encounter (HOSPITAL_COMMUNITY): Payer: Self-pay

## 2018-03-14 ENCOUNTER — Other Ambulatory Visit: Payer: Self-pay

## 2018-03-14 ENCOUNTER — Other Ambulatory Visit (HOSPITAL_COMMUNITY): Payer: Self-pay | Admitting: Nephrology

## 2018-03-14 ENCOUNTER — Ambulatory Visit (HOSPITAL_COMMUNITY): Payer: Medicare HMO | Admitting: Certified Registered Nurse Anesthetist

## 2018-03-14 ENCOUNTER — Ambulatory Visit (HOSPITAL_COMMUNITY)
Admission: RE | Admit: 2018-03-14 | Discharge: 2018-03-14 | Disposition: A | Discharge status: Home / Self Care | Payer: Medicare HMO | Source: Ambulatory Visit | Attending: Internal Medicine | Admitting: Internal Medicine

## 2018-03-14 ENCOUNTER — Encounter (HOSPITAL_COMMUNITY)
Admission: RE | Disposition: A | Discharge status: Home / Self Care | Payer: Self-pay | Source: Ambulatory Visit | Attending: Internal Medicine

## 2018-03-14 DIAGNOSIS — D123 Benign neoplasm of transverse colon: Secondary | ICD-10-CM

## 2018-03-14 DIAGNOSIS — K635 Polyp of colon: Secondary | ICD-10-CM | POA: Diagnosis not present

## 2018-03-14 DIAGNOSIS — R933 Abnormal findings on diagnostic imaging of other parts of digestive tract: Secondary | ICD-10-CM | POA: Insufficient documentation

## 2018-03-14 DIAGNOSIS — K5730 Diverticulosis of large intestine without perforation or abscess without bleeding: Secondary | ICD-10-CM | POA: Diagnosis not present

## 2018-03-14 DIAGNOSIS — I252 Old myocardial infarction: Secondary | ICD-10-CM | POA: Insufficient documentation

## 2018-03-14 DIAGNOSIS — N186 End stage renal disease: Secondary | ICD-10-CM | POA: Diagnosis not present

## 2018-03-14 DIAGNOSIS — Z8673 Personal history of transient ischemic attack (TIA), and cerebral infarction without residual deficits: Secondary | ICD-10-CM | POA: Insufficient documentation

## 2018-03-14 DIAGNOSIS — Z79899 Other long term (current) drug therapy: Secondary | ICD-10-CM | POA: Insufficient documentation

## 2018-03-14 DIAGNOSIS — D122 Benign neoplasm of ascending colon: Secondary | ICD-10-CM | POA: Diagnosis not present

## 2018-03-14 DIAGNOSIS — D125 Benign neoplasm of sigmoid colon: Secondary | ICD-10-CM

## 2018-03-14 DIAGNOSIS — I5022 Chronic systolic (congestive) heart failure: Secondary | ICD-10-CM | POA: Insufficient documentation

## 2018-03-14 DIAGNOSIS — Z7982 Long term (current) use of aspirin: Secondary | ICD-10-CM | POA: Insufficient documentation

## 2018-03-14 DIAGNOSIS — N184 Chronic kidney disease, stage 4 (severe): Secondary | ICD-10-CM | POA: Insufficient documentation

## 2018-03-14 DIAGNOSIS — Z87891 Personal history of nicotine dependence: Secondary | ICD-10-CM | POA: Diagnosis not present

## 2018-03-14 DIAGNOSIS — Z833 Family history of diabetes mellitus: Secondary | ICD-10-CM | POA: Insufficient documentation

## 2018-03-14 DIAGNOSIS — D693 Immune thrombocytopenic purpura: Secondary | ICD-10-CM | POA: Insufficient documentation

## 2018-03-14 DIAGNOSIS — R197 Diarrhea, unspecified: Secondary | ICD-10-CM | POA: Diagnosis not present

## 2018-03-14 DIAGNOSIS — E1122 Type 2 diabetes mellitus with diabetic chronic kidney disease: Secondary | ICD-10-CM | POA: Insufficient documentation

## 2018-03-14 DIAGNOSIS — E1151 Type 2 diabetes mellitus with diabetic peripheral angiopathy without gangrene: Secondary | ICD-10-CM | POA: Insufficient documentation

## 2018-03-14 DIAGNOSIS — Z7902 Long term (current) use of antithrombotics/antiplatelets: Secondary | ICD-10-CM | POA: Insufficient documentation

## 2018-03-14 DIAGNOSIS — Z8249 Family history of ischemic heart disease and other diseases of the circulatory system: Secondary | ICD-10-CM | POA: Diagnosis not present

## 2018-03-14 DIAGNOSIS — E7800 Pure hypercholesterolemia, unspecified: Secondary | ICD-10-CM | POA: Diagnosis not present

## 2018-03-14 DIAGNOSIS — Z86010 Personal history of colonic polyps: Secondary | ICD-10-CM

## 2018-03-14 DIAGNOSIS — Z95810 Presence of automatic (implantable) cardiac defibrillator: Secondary | ICD-10-CM | POA: Diagnosis not present

## 2018-03-14 DIAGNOSIS — I255 Ischemic cardiomyopathy: Secondary | ICD-10-CM | POA: Insufficient documentation

## 2018-03-14 DIAGNOSIS — I130 Hypertensive heart and chronic kidney disease with heart failure and stage 1 through stage 4 chronic kidney disease, or unspecified chronic kidney disease: Secondary | ICD-10-CM | POA: Diagnosis not present

## 2018-03-14 DIAGNOSIS — I2510 Atherosclerotic heart disease of native coronary artery without angina pectoris: Secondary | ICD-10-CM | POA: Diagnosis not present

## 2018-03-14 DIAGNOSIS — Z860101 Personal history of adenomatous and serrated colon polyps: Secondary | ICD-10-CM

## 2018-03-14 DIAGNOSIS — R948 Abnormal results of function studies of other organs and systems: Secondary | ICD-10-CM

## 2018-03-14 DIAGNOSIS — R911 Solitary pulmonary nodule: Secondary | ICD-10-CM

## 2018-03-14 DIAGNOSIS — R634 Abnormal weight loss: Secondary | ICD-10-CM | POA: Diagnosis not present

## 2018-03-14 DIAGNOSIS — N2581 Secondary hyperparathyroidism of renal origin: Secondary | ICD-10-CM | POA: Diagnosis not present

## 2018-03-14 HISTORY — PX: POLYPECTOMY: SHX5525

## 2018-03-14 HISTORY — PX: COLONOSCOPY WITH PROPOFOL: SHX5780

## 2018-03-14 LAB — GLUCOSE, CAPILLARY: Glucose-Capillary: 133 mg/dL — ABNORMAL HIGH (ref 70–99)

## 2018-03-14 SURGERY — COLONOSCOPY WITH PROPOFOL
Anesthesia: Monitor Anesthesia Care

## 2018-03-14 MED ORDER — PROPOFOL 500 MG/50ML IV EMUL
INTRAVENOUS | Status: DC | PRN
  Administered 2018-03-14: 125 ug/kg/min via INTRAVENOUS

## 2018-03-14 MED ORDER — LIDOCAINE 2% (20 MG/ML) 5 ML SYRINGE
INTRAMUSCULAR | Status: DC | PRN
  Administered 2018-03-14: 60 mg via INTRAVENOUS

## 2018-03-14 MED ORDER — PROPOFOL 10 MG/ML IV BOLUS
INTRAVENOUS | Status: DC | PRN
  Administered 2018-03-14: 20 mg via INTRAVENOUS

## 2018-03-14 MED ORDER — SODIUM CHLORIDE 0.9 % IV SOLN
INTRAVENOUS | Status: DC
  Administered 2018-03-14: 11:00:00 via INTRAVENOUS

## 2018-03-14 SURGICAL SUPPLY — 22 items

## 2018-03-14 NOTE — Discharge Instructions (Signed)
° °  I found and removed 5 small polyps. I took biopsies of the colon to see if there was underlying inflammation that would cause diarrhea.  Please restart Plavix (clopidogrel) tomorrow.  I will let you know pathology results and when/if to have another routine colonoscopy by phone.  I left furosemide and ImDur on your medication list. You should clarify with the prescribing provider if you still need to take these.   I appreciate the opportunity to care for you. Gatha Mayer, MD, FACG  YOU HAD AN ENDOSCOPIC PROCEDURE TODAY: Refer to the procedure report and other information in the discharge instructions given to you for any specific questions about what was found during the examination. If this information does not answer your questions, please call Dr. Celesta Aver office at [phone] to clarify.   YOU SHOULD EXPECT: Some feelings of bloating in the abdomen. Passage of more gas than usual. Walking can help get rid of the air that was put into your GI tract during the procedure and reduce the bloating. If you had a lower endoscopy (such as a colonoscopy or flexible sigmoidoscopy) you may notice spotting of blood in your stool or on the toilet paper. Some abdominal soreness may be present for a day or two, also.  DIET: Your first meal following the procedure should be a light meal and then it is ok to progress to your normal diet. A half-sandwich or bowl of soup is an example of a good first meal. Heavy or fried foods are harder to digest and may make you feel nauseous or bloated. Drink plenty of fluids but you should avoid alcoholic beverages for 24 hours.   ACTIVITY: Your care partner should take you home directly after the procedure. You should plan to take it easy, moving slowly for the rest of the day. You can resume normal activity the day after the procedure however YOU SHOULD NOT DRIVE, use power tools, machinery or perform tasks that involve climbing or major physical exertion  for 24 hours (because of the sedation medicines used during the test).   SYMPTOMS TO REPORT IMMEDIATELY: A gastroenterologist can be reached at any hour. Please call [phone]  for any of the following symptoms:  Following lower endoscopy (colonoscopy, flexible sigmoidoscopy) Excessive amounts of blood in the stool  Significant tenderness, worsening of abdominal pains  Swelling of the abdomen that is new, acute  Fever of 100 or higher   FOLLOW UP:  If any biopsies were taken you will be contacted by phone or by letter within the next 1-3 weeks. Call [phone]  if you have not heard about the biopsies in 3 weeks.  Please also call with any specific questions about appointments or follow up tests.

## 2018-03-14 NOTE — Anesthesia Postprocedure Evaluation (Signed)
Anesthesia Post Note  Patient: Kristen Bradley  Procedure(s) Performed: COLONOSCOPY WITH PROPOFOL (N/A ) BIOPSY POLYPECTOMY     Patient location during evaluation: PACU Anesthesia Type: MAC Level of consciousness: awake and alert Pain management: pain level controlled Vital Signs Assessment: post-procedure vital signs reviewed and stable Respiratory status: spontaneous breathing, nonlabored ventilation, respiratory function stable and patient connected to nasal cannula oxygen Cardiovascular status: stable and blood pressure returned to baseline Postop Assessment: no apparent nausea or vomiting Anesthetic complications: no    Last Vitals:  Vitals:   03/14/18 1120 03/14/18 1130  BP: (!) 117/34 (!) 125/45  Pulse: 66 67  Resp: 19 15  Temp:    SpO2: 100% 93%    Last Pain:  Vitals:   03/14/18 1130  TempSrc:   PainSc: 0-No pain                 Tenzin Pavon DAVID

## 2018-03-14 NOTE — Anesthesia Preprocedure Evaluation (Addendum)
Anesthesia Evaluation  Patient identified by MRN, date of birth, ID band Patient awake    Reviewed: Allergy & Precautions, NPO status , Patient's Chart, lab work & pertinent test results  Airway Mallampati: I  TM Distance: >3 FB Neck ROM: Full    Dental   Pulmonary former smoker,    Pulmonary exam normal        Cardiovascular hypertension, Pt. on medications + CAD and + Past MI  Normal cardiovascular exam     Neuro/Psych CVA    GI/Hepatic   Endo/Other  diabetes, Type 2  Renal/GU Renal InsufficiencyRenal disease     Musculoskeletal   Abdominal   Peds  Hematology ITP Plts 81K   Anesthesia Other Findings   Reproductive/Obstetrics                            Anesthesia Physical Anesthesia Plan  ASA: III  Anesthesia Plan: MAC   Post-op Pain Management:    Induction:   PONV Risk Score and Plan: Treatment may vary due to age or medical condition  Airway Management Planned: Simple Face Mask  Additional Equipment:   Intra-op Plan:   Post-operative Plan:   Informed Consent: I have reviewed the patients History and Physical, chart, labs and discussed the procedure including the risks, benefits and alternatives for the proposed anesthesia with the patient or authorized representative who has indicated his/her understanding and acceptance.       Plan Discussed with: CRNA and Surgeon  Anesthesia Plan Comments:         Anesthesia Quick Evaluation

## 2018-03-14 NOTE — Op Note (Addendum)
[facility] Patient Name: Kristen Bradley Procedure Date: 03/14/2018 MRN: [id number] Attending MD: Gatha Mayer , MD Date of Birth: [birth year] CSN: [id number] Age: 73 Admit Type: Outpatient Procedure:                Colonoscopy Indications:              Clinically significant diarrhea of unexplained                            origin, Abnormal CT of the GI tract, Abnormal PET                            scan of the GI tract - sigmoid colon Providers:                Gatha Mayer, MD, Cleda Daub, RN, Charolette Child, Technician, Caryl Pina CRNA Referring MD:              Medicines:                Propofol per Anesthesia, Monitored Anesthesia Care Complications:            No immediate complications. Estimated Blood Loss:     Estimated blood loss was minimal. Procedure:                Pre-Anesthesia Assessment:                           - Prior to the procedure, a History and Physical                            was performed, and patient medications and                            allergies were reviewed. The patient's tolerance of                            previous anesthesia was also reviewed. The risks                            and benefits of the procedure and the sedation                            options and risks were discussed with the patient.                            All questions were answered, and informed consent                            was obtained. Prior Anticoagulants: The patient                            last took Plavix (clopidogrel) 3 days prior to the  procedure. ASA Grade Assessment: III - A patient                            with severe systemic disease. After reviewing the                            risks and benefits, the patient was deemed in                            satisfactory condition to undergo the procedure.                           After obtaining informed consent, the  colonoscope                            was passed under direct vision. Throughout the                            procedure, the patient's blood pressure, pulse, and                            oxygen saturations were monitored continuously. The                            CF-HQ190L ([id number] Olympus colonoscope was                            introduced through the anus and advanced to the the                            terminal ileum, with identification of the                            appendiceal orifice and IC valve. The colonoscopy                            was performed without difficulty. The patient                            tolerated the procedure well. The quality of the                            bowel preparation was adequate. Scope In: 10:42:40 AM Scope Out: 11:08:30 AM Scope Withdrawal Time: 0 hours 21 minutes 14 seconds  Total Procedure Duration: 0 hours 25 minutes 50 seconds  Findings:      The perianal and digital rectal examinations were normal.      Five sessile polyps were found in the proximal sigmoid colon, transverse       colon and ascending colon. The polyps were 3 to 8 mm in size. These       polyps were removed with a cold snare. Resection and retrieval were       complete. Verification of patient identification for the specimen was       done. Estimated blood loss was minimal.  Multiple diverticula were found in the sigmoid colon. There was no       evidence of diverticular bleeding.      The terminal ileum appeared normal.      The exam was otherwise without abnormality on direct and retroflexion       views. Impression:               - Five 3 to 8 mm polyps in the proximal sigmoid                            colon, in the transverse colon and in the ascending                            colon, removed with a cold snare. Resected and                            retrieved.                           - Moderate diverticulosis in the sigmoid colon.                             There was no evidence of diverticular bleeding.                           - The examined portion of the ileum was normal.                           - The examination was otherwise normal on direct                            and retroflexion views. Moderate Sedation:      Not Applicable - Patient had care per Anesthesia. Recommendation:           - Patient has a contact number available for                            emergencies. The signs and symptoms of potential                            delayed complications were discussed with the                            patient. Return to normal activities tomorrow.                            Written discharge instructions were provided to the                            patient.                           - Resume previous diet.                           - Continue present  medications.                           - Resume Plavix (clopidogrel) at prior dose                            tomorrow.                           - Repeat colonoscopy is recommended. The                            colonoscopy date will be determined after pathology                            results from today's exam become available for                            review.                           Diarrhea has improved since office visit - seemed                            correlated with initiation of peritoneal dialysis. Procedure Code(s):        --- Professional ---                           [phone], Colonoscopy, flexible; with removal of                            tumor(s), polyp(s), or other lesion(s) by snare                            technique Diagnosis Code(s):        --- Professional ---                           D12.5, Benign neoplasm of sigmoid colon                           D12.3, Benign neoplasm of transverse colon (hepatic                            flexure or splenic flexure)                           D12.2, Benign neoplasm of ascending colon                            R19.7, Diarrhea, unspecified                           R93.3, Abnormal findings on diagnostic imaging of                            other parts of digestive tract  K57.30, Diverticulosis of large intestine without                            perforation or abscess without bleeding CPT copyright 2018 American Medical Association. All rights reserved. The codes documented in this report are preliminary and upon coder review may  be revised to meet current compliance requirements. Gatha Mayer, MD 03/14/2018 11:20:20 AM This report has been signed electronically. Number of Addenda: 0

## 2018-03-14 NOTE — Transfer of Care (Signed)
Immediate Anesthesia Transfer of Care Note  Patient: Kristen Bradley  Procedure(s) Performed: COLONOSCOPY WITH PROPOFOL (N/A ) BIOPSY POLYPECTOMY  Patient Location: Endoscopy Unit  Anesthesia Type:MAC  Level of Consciousness: awake, alert , oriented and patient cooperative  Airway & Oxygen Therapy: Patient Spontanous Breathing and Patient connected to nasal cannula oxygen  Post-op Assessment: Report given to RN, Post -op Vital signs reviewed and stable and Patient moving all extremities  Post vital signs: Reviewed and stable  Last Vitals:  Vitals Value Taken Time  BP 118/49 03/14/2018 11:18 AM  Temp 36.4 C 03/14/2018 11:16 AM  Pulse 66 03/14/2018 11:20 AM  Resp 19 03/14/2018 11:20 AM  SpO2 100 % 03/14/2018 11:20 AM  Vitals shown include unvalidated device data.  Last Pain:  Vitals:   03/14/18 1116  TempSrc: Oral  PainSc: 0-No pain         Complications: No apparent anesthesia complications

## 2018-03-14 NOTE — H&P (Signed)
[facility] History and Physical   Primary Care Physician:  [name], MD   Reason for Procedure:   diarrhea and abnormal imaging  Plan:    colonoscopy     HPI: Kristen Bradley is a 73 y.o. female here for colonoscopy - she was seen in January in my office as below - no changes.  Encounter Diagnoses  Name Primary?  . Diarrhea, unspecified type Yes  . Loss of weight   . Abnormal PET scan of colon   . Nodule of lower lobe of right lung   Because of these problems not entirely clear to me, she could have some sort of inflammatory bowel disease and loose protein that way and perhaps that would correlate with the findings on the CT and PET scans.  Malignancy is possible as well.  The pattern of pain and diarrhea is not entirely consistent with mesenteric ischemia though I think she is at risk for that, given her vascular disease.  A paraneoplastic syndrome is possible, the PET scan suggested slight enlargement of the lung lesion thought most likely to be benign but they were not sure.  Colonoscopy to investigate these problems is appropriate.  It needs to be done at the hospital due to her cardiomyopathy and low ejection fraction, there is a better safety net there.  The risks and benefits as well as alternatives of endoscopic procedure(s) have been discussed and reviewed. All questions answered. The patient agrees to proceed.  She is at increased risk of problems due to her comorbidities.  She says she cannot take a colonoscopy prep and is unwilling to consider any type of routine or standard preparation.  Thus she will start clear liquids the day before her colonoscopy and take 4 Dulcolax in the morning afternoon and evening before her procedure and have 2 saline enemas the morning of.  She understands this may not produce an adequate prep.   The patient tells that she is off her clopidogrel although I cannot see documentation in the medical records available to me  that support this.  I will check with her vascular surgeon, Dr. Donzetta Matters about this.  I will get copies of her previous evaluation at [facility]. 03/06/2018 update EGD/colonoscopy below  09/2013 EGD - reactive gastropathy - no H pylori Colonoscopy 6 tubular adenomas max 15 mm  May need a repeat PET scan.  I will send copies to Dr. [name] and Dr. Edrick Oh  Subjective:   Chief Complaint: Diarrhea and weight loss  HPI This is a 73 year old African-American woman who says that after starting chronic ambulatory peritoneal dialysis in 2019 she has had diarrhea.  She uses the dialysis equipment overnight and then has 5-10 softer watery stools a day.  It started after that and is been chronic and has been associated with a drop in albumin and a drop in weight.  She has had some vague abdominal cramping.  When she takes Imodium it helps and allows her to leave the house.  She has not had any bleeding, there is been no fecal incontinence.  She recalls having a colonoscopy "many years ago", presumably by Dr. Michail Sermon, but does not know the results. She has been losing weight as documented below.  She has some fatigue.     Wt Readings from Last 3 Encounters:  02/15/18 124 lb 4 oz (56.4 kg)  01/13/18 123 lb (55.8 kg)  12/23/17 134 lb (60.8 kg)   In the past year she has had imaging studies  as below.  CT Abd/Pelvis w/o contrast 05/2017 IMPRESSION: 1. Mild sigmoid diverticulosis. Mild eccentric wall thickening with adjacent fat stranding in the sigmoid colon, which may indicate a mild sigmoid diverticulitis. No free air. No abscess. 2. Moderate colonic stool volume, suggesting constipation. No bowel obstruction. 3. Irregular solid 1.4 cm peripheral right lower lobe pulmonary nodule, increased in size since 2015 chest CT, cannot exclude primary bronchogenic carcinoma. Recommend further characterization with PET-CT. 4. Multiple small indeterminate renal cortical masses,  a few of which have increased in size since 2015 CT, largest 1.6 cm in the anterior lower right kidney. These could represent complex renal cysts or renal cell carcinomas. If the patient's renal function improves (GFR greater than 30), further characterization would be recommended with CT abdomen without and with IV contrast (MRI is presumably not an option given the ICD). If the patient's renal function continues to preclude administration of IV contrast, surveillance non-contrast CT abdomen is probably the best option in 6 months. 5. Enlarging 3.8 cm infrarenal abdominal aortic aneurysm. Recommend followup by ultrasound in 2 years. This recommendation follows ACR consensus guidelines: White Paper of the ACR Incidental Findings Committee II on Vascular Findings. J Am Coll Radiol 2013; 10:789-794. 6. Aortic Atherosclerosis (ICD10-I70.0). Coronary atherosclerosis.  PET scan 06/17/2017 IMPRESSION: 1. The right lower lobe nodule is blurred by motion artifact and has slightly indistinct nodules but measures about 1.0 by 0.6. Maximum standard uptake value is 1.2. Although the maximum SUV is somewhat reassuring, low-grade adenocarcinoma is not excluded, and the probable increase in size compared to 2015 is still concerning. At a minimum, surveillance by chest CT is likely warranted. 2. Again observed is a bandlike density adjacent to the sigmoid colon in the upper pelvis with low-grade metabolic activity, maximum SUV 3.6. This has an appearance suggesting postinflammatory findings related to prior diverticulitis. Strictly speaking, a carcinoid tumor or local colon cancer could have a similar appearance, and I would recommend correlation with colon cancer screening and continued surveillance. 3. Other imaging findings of potential clinical significance: Aortic Atherosclerosis (ICD10-I70.0). Coronary atherosclerosis. Chronic saccular outpouching of the aortic arch. Aortic aneurysm  NOS (ICD10-I71.9). Abdominal aortic aneurysm 3.6 cm at its widest. This is stable. Postoperative findings along the pancreatic tail and spleen. Mild-to-moderate cardiomegaly. Bilateral renal lesions of varying complexity, most likely to be cysts although not technically specific in some cases. Descending and sigmoid colon diverticulosis. Lumbar spondylosis and degenerative disc disease.   Electronically Signed By: Van Clines M.D. On: 06/17/2017 08:58  Labs from November 2019 sent by nephrology Hemoglobin 8.9 MCV 89 platelets 194 white count 12.17 Creatinine 3.14 BUN 92 Total protein 4.8 albumin 2.6 Iron saturation 14% with a low TIBC of 166   Past Medical History:  Diagnosis Date  . 3-vessel CAD - treated medically  02/26/2016  . Acute renal failure superimposed on stage 3 chronic kidney disease (JAARS)   . Adrenal tumor 08/2007   surgery  . Aftercare following surgery of the circulatory system, [place] 05/26/2012  . AKI (acute kidney injury) ([place])   . Atherosclerosis of native artery of extremity with intermittent claudication ([place]) 01/26/2013  . CAD, multiple vessel   . Cancer of right breast [facility])    s/p lumpectomy, XRT  . Cardiomyopathy, ischemic   . Carotid artery disease (HCC)    s/p bilateral CEA  . Carotid stenosis, bilateral-s/p Bilateral CEA 11/05/2011  . Chronic ITP (idiopathic thrombocytopenia) (HCC) 02/24/2016  . Chronic systolic (congestive) heart failure ([place]) 11/05/2016  . Chronic systolic heart failure (  [place]) 03/03/2016  . CKD (chronic kidney disease) stage 4, GFR 15-29 ml/min (HCC)   . Dyspnea   . Elevated brain natriuretic peptide (BNP) level 01/30/2016  . Elevated troponin I level 01/30/2016  . Hepatitis C    "got treatment for it" (01/29/2016)  . HFrEF (heart failure with reduced ejection fraction) ([place])   . High cholesterol    takes Lopid daily  . History of UTI    takes Diflucan dail--pt states no uti in a couple of yrs though  .  Hypertension    takes Losartan,Amlodipine,HCTZ,and Atenolol daily and Clatarpres  . Intermittent claudication ([place]) 11/05/2011  . Ischemic cardiomyopathy    a. 11/2016 s/p MDT BiV ICD w/ his bundle pacing.  . Joint pain    legs  . LBBB- new 01/30/2016  . NSTEMI (non-ST elevated myocardial infarction) ([place])   . Occlusion and stenosis of carotid artery without mention of cerebral infarction 12/17/2011  . PAD (peripheral artery disease) ([place]) 11/26/2011  . Pain in limb 09/08/2012  . Peripheral vascular disease ([place]) 11/05/2011  . Renal artery stenosis (HCC)    Right renal artery stent 05/31/06  . Renal failure syndrome   . Sinus bradycardia   . Sinus pause   . Stroke [facility]) Jan. 7, 2014  . Thrombocytopenia- plts 81K 01/30/2016  . Tobacco abuse   . Type 2 diabetes mellitus with vascular disease ([place])   . Type II diabetes mellitus ([place])     Past Surgical History:  Procedure Laterality Date  . ABDOMINAL AORTOGRAM W/LOWER EXTREMITY N/A 11/21/2017   Procedure: ABDOMINAL AORTOGRAM W/LOWER EXTREMITY;  Surgeon: Waynetta Sandy, MD;  Location: [place] [facility];  Service: Cardiovascular;  Laterality: N/A;  . ABDOMINAL HYSTERECTOMY    . ADRENAL GLAND SURGERY  2009  . BIV ICD INSERTION CRT-D N/A 11/05/2016   Procedure: BIV ICD INSERTION CRT-D;  Surgeon: Evans Lance, MD;  Location: [place] [facility];  Service: Cardiovascular;  Laterality: N/A;  . BREAST LUMPECTOMY Right 2009  . CARDIAC CATHETERIZATION N/A 02/03/2016   Procedure: Right/Left Heart Cath and Coronary Angiography;  Surgeon: Burnell Blanks, MD;  Location: [place] [facility];  Service: Cardiovascular;  Laterality: N/A;  . EMBOLECTOMY Right 11/25/2017   Procedure: ILIAC EMBOLECTOMY;  Surgeon: Marty Heck, MD;  Location: [place];  Service: Vascular;  Laterality: Right;  . ENDARTERECTOMY  01/05/2012   Procedure: ENDARTERECTOMY CAROTID;  Surgeon: Conrad [place]s, MD;  Location: [place];  Service: Vascular;  Laterality:  Right;  Right Carotid Artery Endarterectomy, right stump pressure, intraoperative ultrasound  . ENDARTERECTOMY  02/08/2012   Procedure: ENDARTERECTOMY CAROTID;  Surgeon: Conrad [place], MD;  Location: [place];  Service: Vascular;  Laterality: Left;  . ENDARTERECTOMY FEMORAL Right 11/25/2017   Procedure: RIGHT FEMORAL ENDARTERECTOMY WITH PROFUNOPLASTY AND BOVINE PATCH ANGIOPLAST RIGHT ILIOFEMORAL; RIGHT ILIAC THROMBOEMBOLECTOMY;  Surgeon: Marty Heck, MD;  Location: [place];  Service: Vascular;  Laterality: Right;  . LUMBAR DISC SURGERY    . PATCH ANGIOPLASTY  02/08/2012   Procedure: PATCH ANGIOPLASTY;  Surgeon: Conrad [place], MD;  Location: [facility] OR;  Service: Vascular;  Laterality: Left;  Carotid artery patch angioplasty using Vascu-Guard bovine patch 1cm x 6cm.  Marland Kitchen PERIPHERAL VASCULAR INTERVENTION Bilateral 11/21/2017   Procedure: PERIPHERAL VASCULAR INTERVENTION;  Surgeon: Waynetta Sandy, MD;  Location: [place] [facility];  Service: Cardiovascular;  Laterality: Bilateral;  Iliac Stents  . RENAL ARTERY STENT Right 05/2006    Prior to Admission medications  Medication Sig Start Date End Date Taking? Authorizing Provider  acetaminophen (TYLENOL) 500 MG tablet Take 1,000 mg by mouth every 6 (six) hours as needed for mild pain or headache.    Yes [provider]  aspirin EC 81 MG tablet Take 1 tablet (81 mg total) by mouth daily. 11/28/17  Yes Dagoberto Ligas, PA-C  B Complex-C-Zn-Folic Acid (DIALYVITE 378 WITH ZINC) 0.8 MG TABS Take 1 tablet by mouth daily. 02/22/18  Yes [provider]  calcitRIOL (ROCALTROL) 0.5 MCG capsule Take 0.5 mcg by mouth daily. 01/23/18  Yes [provider]  carvedilol (COREG) 6.25 MG tablet Take 1 tablet (6.25 mg total) by mouth 2 (two) times daily with a meal. Patient taking differently: Take 6.25 mg by mouth 2 (two) times daily.  02/03/18  Yes Burnell Blanks, MD  clopidogrel (PLAVIX) 75 MG tablet Take 75 mg by mouth daily.  02/16/18  Yes [provider]  glipiZIDE (GLUCOTROL XL) 2.5 MG 24 hr tablet Take 2.5 mg by mouth daily with breakfast. 02/21/18  Yes [provider]  nitroGLYCERIN (NITROSTAT) 0.4 MG SL tablet Place 1 tablet (0.4 mg total) under the tongue every 5 (five) minutes as needed for chest pain. 03/02/16  Yes Burnell Blanks, MD  potassium chloride SA (K-DUR,KLOR-CON) 20 MEQ tablet Take 30 mEq by mouth 2 (two) times daily. 02/11/18  Yes [provider]  rosuvastatin (CRESTOR) 20 MG tablet Take 20 mg by mouth daily.   Yes [provider]  furosemide (LASIX) 80 MG tablet TAKE 1 TABLET BY MOUTH TWICE DAILY Patient not taking: No sig reported 09/16/17   Burnell Blanks, MD  isosorbide mononitrate (IMDUR) 60 MG 24 hr tablet Take 1 tablet (60 mg total) by mouth daily. Patient not taking: Reported on 03/03/2018 07/12/17   Mendel Corning, MD    Current Facility-Administered Medications  Medication Dose Route Frequency Provider Last Rate Last Dose  . 0.9 %  sodium chloride infusion   Intravenous Continuous Gatha Mayer, MD        Allergies as of 02/15/2018 - Review Complete 02/15/2018  Allergen Reaction Noted  . Chlorhexidine gluconate Itching 02/03/2012  . Ace inhibitors Cough 08/01/2012  . Dilaudid [hydromorphone hcl] Itching 08/01/2012    Family History  Problem Relation Age of Onset  . Cancer Mother        ? type  . Cancer Father        ? type  . Diabetes Sister   . Hypertension Sister   . Diabetes Brother   . Hypertension Brother   . CAD Neg Hx     Social History   Socioeconomic History  . Marital status: Divorced    Spouse name: Not on file  . Number of children: 1  . Years of education: 10th  . Highest education level: Not on file  Occupational History  . Occupation: Retired-truck Education administrator: RETIRED  Social Needs  . Financial resource strain: Not on file  . Food insecurity:    Worry: Not on file    Inability: Not on  file  . Transportation needs:    Medical: Not on file    Non-medical: Not on file  Tobacco Use  . Smoking status: Former Smoker    Packs/day: 0.50    Years: 53.00    Pack years: 26.50    Types: Cigarettes    Last attempt to quit: 03/2016    Years since quitting: 2.0  . Smokeless tobacco: Never Used  Substance and Sexual Activity  . Alcohol use: No    Alcohol/week: 0.0 standard drinks  . Drug use: No  . Sexual activity: Not on file  Lifestyle  . Physical activity:    Days per week: Not on file    Minutes per session: Not on file  . Stress: Not on file  Relationships  . Social connections:    Talks on phone: Not on file    Gets together: Not on file    Attends religious service: Not on file    Active member of club or organization: Not on file    Attends meetings of clubs or organizations: Not on file    Relationship status: Not on file  . Intimate partner violence:    Fear of current or ex partner: Not on file    Emotionally abused: Not on file    Physically abused: Not on file    Forced sexual activity: Not on file  Other Topics Concern  . Not on file  Social History Narrative   Lives with female friend   Has at least 1 son   Former smoker   No drugs or EtOH    Review of Systems:  All other review of systems negative except as mentioned in the HPI.  Physical Exam: Vital signs in last 24 hours:     General:   Alert,  Well-developed, well-nourished, pleasant and cooperative in NAD Lungs:  Clear throughout to auscultation.   Heart:  Regular rate and rhythm; no murmurs, clicks, rubs,  or gallops. Abdomen:  Soft, nontender and nondistended. Normal bowel sounds.   Neuro/Psych:  Alert and cooperative. Normal mood and affect. A and O x 3   @  Simonne Maffucci, MD, [facility] [phone] (pager) 03/14/2018 10:06 AM@

## 2018-03-15 ENCOUNTER — Encounter: Payer: Self-pay | Admitting: Internal Medicine

## 2018-03-15 ENCOUNTER — Encounter (HOSPITAL_COMMUNITY): Payer: Self-pay | Admitting: Internal Medicine

## 2018-03-15 DIAGNOSIS — Z86010 Personal history of colonic polyps: Secondary | ICD-10-CM

## 2018-03-15 DIAGNOSIS — N2581 Secondary hyperparathyroidism of renal origin: Secondary | ICD-10-CM | POA: Diagnosis not present

## 2018-03-15 DIAGNOSIS — N186 End stage renal disease: Secondary | ICD-10-CM | POA: Diagnosis not present

## 2018-03-15 DIAGNOSIS — Z860101 Personal history of adenomatous and serrated colon polyps: Secondary | ICD-10-CM

## 2018-03-15 NOTE — Progress Notes (Signed)
3 adenomas Ischemic changes (sigmoid polyps) Random colon bxs ok (diarrhea is better)  Suspect she had some ischemic colitis that has resolved - would also cause PET scan abnormality in sigmoid  Recall colon 2023

## 2018-03-16 ENCOUNTER — Ambulatory Visit (HOSPITAL_COMMUNITY)
Admission: RE | Admit: 2018-03-16 | Discharge: 2018-03-16 | Disposition: A | Discharge status: Home / Self Care | Payer: Medicare HMO | Source: Ambulatory Visit | Attending: Nephrology | Admitting: Nephrology

## 2018-03-16 ENCOUNTER — Ambulatory Visit (HOSPITAL_COMMUNITY): Payer: Medicare HMO

## 2018-03-16 DIAGNOSIS — C649 Malignant neoplasm of unspecified kidney, except renal pelvis: Secondary | ICD-10-CM

## 2018-03-16 DIAGNOSIS — N281 Cyst of kidney, acquired: Secondary | ICD-10-CM | POA: Diagnosis not present

## 2018-03-16 DIAGNOSIS — N289 Disorder of kidney and ureter, unspecified: Secondary | ICD-10-CM | POA: Diagnosis not present

## 2018-03-16 DIAGNOSIS — N2581 Secondary hyperparathyroidism of renal origin: Secondary | ICD-10-CM | POA: Diagnosis not present

## 2018-03-16 DIAGNOSIS — N186 End stage renal disease: Secondary | ICD-10-CM | POA: Diagnosis not present

## 2018-03-16 MED ORDER — IOHEXOL 300 MG/ML  SOLN
100.0000 mL | Freq: Once | INTRAMUSCULAR | Status: AC | PRN
  Administered 2018-03-16: 100 mL via INTRAVENOUS

## 2018-03-16 MED ORDER — SODIUM CHLORIDE (PF) 0.9 % IJ SOLN
INTRAMUSCULAR | Status: AC
  Filled 2018-03-16: qty 50

## 2018-03-17 DIAGNOSIS — N186 End stage renal disease: Secondary | ICD-10-CM | POA: Diagnosis not present

## 2018-03-17 DIAGNOSIS — N2581 Secondary hyperparathyroidism of renal origin: Secondary | ICD-10-CM | POA: Diagnosis not present

## 2018-03-18 DIAGNOSIS — N186 End stage renal disease: Secondary | ICD-10-CM | POA: Diagnosis not present

## 2018-03-18 DIAGNOSIS — N2581 Secondary hyperparathyroidism of renal origin: Secondary | ICD-10-CM | POA: Diagnosis not present

## 2018-03-19 DIAGNOSIS — N186 End stage renal disease: Secondary | ICD-10-CM | POA: Diagnosis not present

## 2018-03-19 DIAGNOSIS — N2581 Secondary hyperparathyroidism of renal origin: Secondary | ICD-10-CM | POA: Diagnosis not present

## 2018-03-20 DIAGNOSIS — N186 End stage renal disease: Secondary | ICD-10-CM | POA: Diagnosis not present

## 2018-03-20 DIAGNOSIS — N2581 Secondary hyperparathyroidism of renal origin: Secondary | ICD-10-CM | POA: Diagnosis not present

## 2018-03-21 DIAGNOSIS — N186 End stage renal disease: Secondary | ICD-10-CM | POA: Diagnosis not present

## 2018-03-21 DIAGNOSIS — N2581 Secondary hyperparathyroidism of renal origin: Secondary | ICD-10-CM | POA: Diagnosis not present

## 2018-03-22 DIAGNOSIS — N186 End stage renal disease: Secondary | ICD-10-CM | POA: Diagnosis not present

## 2018-03-22 DIAGNOSIS — N2581 Secondary hyperparathyroidism of renal origin: Secondary | ICD-10-CM | POA: Diagnosis not present

## 2018-03-23 DIAGNOSIS — N2581 Secondary hyperparathyroidism of renal origin: Secondary | ICD-10-CM | POA: Diagnosis not present

## 2018-03-23 DIAGNOSIS — N186 End stage renal disease: Secondary | ICD-10-CM | POA: Diagnosis not present

## 2018-03-24 DIAGNOSIS — N186 End stage renal disease: Secondary | ICD-10-CM | POA: Diagnosis not present

## 2018-03-24 DIAGNOSIS — N2581 Secondary hyperparathyroidism of renal origin: Secondary | ICD-10-CM | POA: Diagnosis not present

## 2018-03-25 DIAGNOSIS — N2581 Secondary hyperparathyroidism of renal origin: Secondary | ICD-10-CM | POA: Diagnosis not present

## 2018-03-25 DIAGNOSIS — N186 End stage renal disease: Secondary | ICD-10-CM | POA: Diagnosis not present

## 2018-03-26 DIAGNOSIS — N2581 Secondary hyperparathyroidism of renal origin: Secondary | ICD-10-CM | POA: Diagnosis not present

## 2018-03-26 DIAGNOSIS — N186 End stage renal disease: Secondary | ICD-10-CM | POA: Diagnosis not present

## 2018-03-27 DIAGNOSIS — N2581 Secondary hyperparathyroidism of renal origin: Secondary | ICD-10-CM | POA: Diagnosis not present

## 2018-03-27 DIAGNOSIS — N186 End stage renal disease: Secondary | ICD-10-CM | POA: Diagnosis not present

## 2018-03-28 ENCOUNTER — Ambulatory Visit (HOSPITAL_COMMUNITY): Payer: Medicare HMO

## 2018-03-28 ENCOUNTER — Encounter (HOSPITAL_COMMUNITY): Payer: Self-pay

## 2018-03-28 DIAGNOSIS — N186 End stage renal disease: Secondary | ICD-10-CM | POA: Diagnosis not present

## 2018-03-28 DIAGNOSIS — N2581 Secondary hyperparathyroidism of renal origin: Secondary | ICD-10-CM | POA: Diagnosis not present

## 2018-03-29 DIAGNOSIS — N186 End stage renal disease: Secondary | ICD-10-CM | POA: Diagnosis not present

## 2018-03-29 DIAGNOSIS — N2581 Secondary hyperparathyroidism of renal origin: Secondary | ICD-10-CM | POA: Diagnosis not present

## 2018-03-30 DIAGNOSIS — N186 End stage renal disease: Secondary | ICD-10-CM | POA: Diagnosis not present

## 2018-03-30 DIAGNOSIS — N2581 Secondary hyperparathyroidism of renal origin: Secondary | ICD-10-CM | POA: Diagnosis not present

## 2018-03-31 DIAGNOSIS — N2581 Secondary hyperparathyroidism of renal origin: Secondary | ICD-10-CM | POA: Diagnosis not present

## 2018-03-31 DIAGNOSIS — N186 End stage renal disease: Secondary | ICD-10-CM | POA: Diagnosis not present

## 2018-04-01 DIAGNOSIS — Z992 Dependence on renal dialysis: Secondary | ICD-10-CM | POA: Diagnosis not present

## 2018-04-01 DIAGNOSIS — E1122 Type 2 diabetes mellitus with diabetic chronic kidney disease: Secondary | ICD-10-CM | POA: Diagnosis not present

## 2018-04-01 DIAGNOSIS — N2581 Secondary hyperparathyroidism of renal origin: Secondary | ICD-10-CM | POA: Diagnosis not present

## 2018-04-01 DIAGNOSIS — N186 End stage renal disease: Secondary | ICD-10-CM | POA: Diagnosis not present

## 2018-04-02 DIAGNOSIS — N2581 Secondary hyperparathyroidism of renal origin: Secondary | ICD-10-CM | POA: Diagnosis not present

## 2018-04-02 DIAGNOSIS — N186 End stage renal disease: Secondary | ICD-10-CM | POA: Diagnosis not present

## 2018-04-03 DIAGNOSIS — N2581 Secondary hyperparathyroidism of renal origin: Secondary | ICD-10-CM | POA: Diagnosis not present

## 2018-04-03 DIAGNOSIS — N186 End stage renal disease: Secondary | ICD-10-CM | POA: Diagnosis not present

## 2018-04-04 DIAGNOSIS — N186 End stage renal disease: Secondary | ICD-10-CM | POA: Diagnosis not present

## 2018-04-04 DIAGNOSIS — N2581 Secondary hyperparathyroidism of renal origin: Secondary | ICD-10-CM | POA: Diagnosis not present

## 2018-04-05 DIAGNOSIS — N186 End stage renal disease: Secondary | ICD-10-CM | POA: Diagnosis not present

## 2018-04-05 DIAGNOSIS — N2581 Secondary hyperparathyroidism of renal origin: Secondary | ICD-10-CM | POA: Diagnosis not present

## 2018-04-06 DIAGNOSIS — N186 End stage renal disease: Secondary | ICD-10-CM | POA: Diagnosis not present

## 2018-04-06 DIAGNOSIS — N2581 Secondary hyperparathyroidism of renal origin: Secondary | ICD-10-CM | POA: Diagnosis not present

## 2018-04-07 ENCOUNTER — Ambulatory Visit (HOSPITAL_COMMUNITY)
Admission: RE | Admit: 2018-04-07 | Discharge: 2018-04-07 | Disposition: A | Discharge status: Home / Self Care | Payer: Medicare HMO | Source: Ambulatory Visit | Attending: Nephrology | Admitting: Nephrology

## 2018-04-07 DIAGNOSIS — N186 End stage renal disease: Secondary | ICD-10-CM | POA: Diagnosis not present

## 2018-04-07 DIAGNOSIS — R911 Solitary pulmonary nodule: Secondary | ICD-10-CM | POA: Insufficient documentation

## 2018-04-07 DIAGNOSIS — N2581 Secondary hyperparathyroidism of renal origin: Secondary | ICD-10-CM | POA: Diagnosis not present

## 2018-04-07 LAB — GLUCOSE, CAPILLARY: GLUCOSE-CAPILLARY: 152 mg/dL — AB (ref 70–99)

## 2018-04-07 MED ORDER — FLUDEOXYGLUCOSE F - 18 (FDG) INJECTION
6.3000 | Freq: Once | INTRAVENOUS | Status: AC | PRN
  Administered 2018-04-07: 6.3 via INTRAVENOUS

## 2018-04-08 DIAGNOSIS — N2581 Secondary hyperparathyroidism of renal origin: Secondary | ICD-10-CM | POA: Diagnosis not present

## 2018-04-08 DIAGNOSIS — N186 End stage renal disease: Secondary | ICD-10-CM | POA: Diagnosis not present

## 2018-04-09 DIAGNOSIS — N2581 Secondary hyperparathyroidism of renal origin: Secondary | ICD-10-CM | POA: Diagnosis not present

## 2018-04-09 DIAGNOSIS — N186 End stage renal disease: Secondary | ICD-10-CM | POA: Diagnosis not present

## 2018-04-10 DIAGNOSIS — N2581 Secondary hyperparathyroidism of renal origin: Secondary | ICD-10-CM | POA: Diagnosis not present

## 2018-04-10 DIAGNOSIS — N186 End stage renal disease: Secondary | ICD-10-CM | POA: Diagnosis not present

## 2018-04-11 DIAGNOSIS — N2581 Secondary hyperparathyroidism of renal origin: Secondary | ICD-10-CM | POA: Diagnosis not present

## 2018-04-11 DIAGNOSIS — N186 End stage renal disease: Secondary | ICD-10-CM | POA: Diagnosis not present

## 2018-04-12 DIAGNOSIS — N2581 Secondary hyperparathyroidism of renal origin: Secondary | ICD-10-CM | POA: Diagnosis not present

## 2018-04-12 DIAGNOSIS — N186 End stage renal disease: Secondary | ICD-10-CM | POA: Diagnosis not present

## 2018-04-13 DIAGNOSIS — N186 End stage renal disease: Secondary | ICD-10-CM | POA: Diagnosis not present

## 2018-04-13 DIAGNOSIS — N2581 Secondary hyperparathyroidism of renal origin: Secondary | ICD-10-CM | POA: Diagnosis not present

## 2018-04-14 DIAGNOSIS — N2581 Secondary hyperparathyroidism of renal origin: Secondary | ICD-10-CM | POA: Diagnosis not present

## 2018-04-14 DIAGNOSIS — N186 End stage renal disease: Secondary | ICD-10-CM | POA: Diagnosis not present

## 2018-04-15 DIAGNOSIS — N186 End stage renal disease: Secondary | ICD-10-CM | POA: Diagnosis not present

## 2018-04-15 DIAGNOSIS — N2581 Secondary hyperparathyroidism of renal origin: Secondary | ICD-10-CM | POA: Diagnosis not present

## 2018-04-16 DIAGNOSIS — N2581 Secondary hyperparathyroidism of renal origin: Secondary | ICD-10-CM | POA: Diagnosis not present

## 2018-04-16 DIAGNOSIS — N186 End stage renal disease: Secondary | ICD-10-CM | POA: Diagnosis not present

## 2018-04-17 DIAGNOSIS — N2581 Secondary hyperparathyroidism of renal origin: Secondary | ICD-10-CM | POA: Diagnosis not present

## 2018-04-17 DIAGNOSIS — N186 End stage renal disease: Secondary | ICD-10-CM | POA: Diagnosis not present

## 2018-04-18 DIAGNOSIS — N2581 Secondary hyperparathyroidism of renal origin: Secondary | ICD-10-CM | POA: Diagnosis not present

## 2018-04-18 DIAGNOSIS — N186 End stage renal disease: Secondary | ICD-10-CM | POA: Diagnosis not present

## 2018-04-19 DIAGNOSIS — N2581 Secondary hyperparathyroidism of renal origin: Secondary | ICD-10-CM | POA: Diagnosis not present

## 2018-04-19 DIAGNOSIS — N186 End stage renal disease: Secondary | ICD-10-CM | POA: Diagnosis not present

## 2018-04-20 DIAGNOSIS — N186 End stage renal disease: Secondary | ICD-10-CM | POA: Diagnosis not present

## 2018-04-20 DIAGNOSIS — N2581 Secondary hyperparathyroidism of renal origin: Secondary | ICD-10-CM | POA: Diagnosis not present

## 2018-04-21 DIAGNOSIS — N186 End stage renal disease: Secondary | ICD-10-CM | POA: Diagnosis not present

## 2018-04-21 DIAGNOSIS — N2581 Secondary hyperparathyroidism of renal origin: Secondary | ICD-10-CM | POA: Diagnosis not present

## 2018-04-22 DIAGNOSIS — N186 End stage renal disease: Secondary | ICD-10-CM | POA: Diagnosis not present

## 2018-04-22 DIAGNOSIS — N2581 Secondary hyperparathyroidism of renal origin: Secondary | ICD-10-CM | POA: Diagnosis not present

## 2018-04-23 DIAGNOSIS — N186 End stage renal disease: Secondary | ICD-10-CM | POA: Diagnosis not present

## 2018-04-23 DIAGNOSIS — N2581 Secondary hyperparathyroidism of renal origin: Secondary | ICD-10-CM | POA: Diagnosis not present

## 2018-04-24 DIAGNOSIS — N2581 Secondary hyperparathyroidism of renal origin: Secondary | ICD-10-CM | POA: Diagnosis not present

## 2018-04-24 DIAGNOSIS — N186 End stage renal disease: Secondary | ICD-10-CM | POA: Diagnosis not present

## 2018-04-25 DIAGNOSIS — N2581 Secondary hyperparathyroidism of renal origin: Secondary | ICD-10-CM | POA: Diagnosis not present

## 2018-04-25 DIAGNOSIS — N186 End stage renal disease: Secondary | ICD-10-CM | POA: Diagnosis not present

## 2018-04-26 DIAGNOSIS — N186 End stage renal disease: Secondary | ICD-10-CM | POA: Diagnosis not present

## 2018-04-26 DIAGNOSIS — N2581 Secondary hyperparathyroidism of renal origin: Secondary | ICD-10-CM | POA: Diagnosis not present

## 2018-04-27 DIAGNOSIS — N2581 Secondary hyperparathyroidism of renal origin: Secondary | ICD-10-CM | POA: Diagnosis not present

## 2018-04-27 DIAGNOSIS — N186 End stage renal disease: Secondary | ICD-10-CM | POA: Diagnosis not present

## 2018-04-28 DIAGNOSIS — N186 End stage renal disease: Secondary | ICD-10-CM | POA: Diagnosis not present

## 2018-04-28 DIAGNOSIS — N2581 Secondary hyperparathyroidism of renal origin: Secondary | ICD-10-CM | POA: Diagnosis not present

## 2018-04-29 DIAGNOSIS — N186 End stage renal disease: Secondary | ICD-10-CM | POA: Diagnosis not present

## 2018-04-29 DIAGNOSIS — N2581 Secondary hyperparathyroidism of renal origin: Secondary | ICD-10-CM | POA: Diagnosis not present

## 2018-04-30 DIAGNOSIS — N186 End stage renal disease: Secondary | ICD-10-CM | POA: Diagnosis not present

## 2018-04-30 DIAGNOSIS — N2581 Secondary hyperparathyroidism of renal origin: Secondary | ICD-10-CM | POA: Diagnosis not present

## 2018-05-01 DIAGNOSIS — N186 End stage renal disease: Secondary | ICD-10-CM | POA: Diagnosis not present

## 2018-05-01 DIAGNOSIS — N2581 Secondary hyperparathyroidism of renal origin: Secondary | ICD-10-CM | POA: Diagnosis not present

## 2018-05-02 DIAGNOSIS — N186 End stage renal disease: Secondary | ICD-10-CM | POA: Diagnosis not present

## 2018-05-02 DIAGNOSIS — Z992 Dependence on renal dialysis: Secondary | ICD-10-CM | POA: Diagnosis not present

## 2018-05-02 DIAGNOSIS — E1122 Type 2 diabetes mellitus with diabetic chronic kidney disease: Secondary | ICD-10-CM | POA: Diagnosis not present

## 2018-05-02 DIAGNOSIS — N2581 Secondary hyperparathyroidism of renal origin: Secondary | ICD-10-CM | POA: Diagnosis not present

## 2018-05-03 DIAGNOSIS — N2581 Secondary hyperparathyroidism of renal origin: Secondary | ICD-10-CM | POA: Diagnosis not present

## 2018-05-03 DIAGNOSIS — N186 End stage renal disease: Secondary | ICD-10-CM | POA: Diagnosis not present

## 2018-05-04 DIAGNOSIS — N186 End stage renal disease: Secondary | ICD-10-CM | POA: Diagnosis not present

## 2018-05-04 DIAGNOSIS — N2581 Secondary hyperparathyroidism of renal origin: Secondary | ICD-10-CM | POA: Diagnosis not present

## 2018-05-05 DIAGNOSIS — N186 End stage renal disease: Secondary | ICD-10-CM | POA: Diagnosis not present

## 2018-05-05 DIAGNOSIS — N2581 Secondary hyperparathyroidism of renal origin: Secondary | ICD-10-CM | POA: Diagnosis not present

## 2018-05-06 DIAGNOSIS — N2581 Secondary hyperparathyroidism of renal origin: Secondary | ICD-10-CM | POA: Diagnosis not present

## 2018-05-06 DIAGNOSIS — N186 End stage renal disease: Secondary | ICD-10-CM | POA: Diagnosis not present

## 2018-05-07 DIAGNOSIS — N2581 Secondary hyperparathyroidism of renal origin: Secondary | ICD-10-CM | POA: Diagnosis not present

## 2018-05-07 DIAGNOSIS — N186 End stage renal disease: Secondary | ICD-10-CM | POA: Diagnosis not present

## 2018-05-08 DIAGNOSIS — N186 End stage renal disease: Secondary | ICD-10-CM | POA: Diagnosis not present

## 2018-05-08 DIAGNOSIS — N2581 Secondary hyperparathyroidism of renal origin: Secondary | ICD-10-CM | POA: Diagnosis not present

## 2018-05-09 DIAGNOSIS — N2581 Secondary hyperparathyroidism of renal origin: Secondary | ICD-10-CM | POA: Diagnosis not present

## 2018-05-09 DIAGNOSIS — N186 End stage renal disease: Secondary | ICD-10-CM | POA: Diagnosis not present

## 2018-05-10 DIAGNOSIS — N186 End stage renal disease: Secondary | ICD-10-CM | POA: Diagnosis not present

## 2018-05-10 DIAGNOSIS — N2581 Secondary hyperparathyroidism of renal origin: Secondary | ICD-10-CM | POA: Diagnosis not present

## 2018-05-11 DIAGNOSIS — N186 End stage renal disease: Secondary | ICD-10-CM | POA: Diagnosis not present

## 2018-05-11 DIAGNOSIS — N2581 Secondary hyperparathyroidism of renal origin: Secondary | ICD-10-CM | POA: Diagnosis not present

## 2018-05-12 DIAGNOSIS — N2581 Secondary hyperparathyroidism of renal origin: Secondary | ICD-10-CM | POA: Diagnosis not present

## 2018-05-12 DIAGNOSIS — N186 End stage renal disease: Secondary | ICD-10-CM | POA: Diagnosis not present

## 2018-05-13 DIAGNOSIS — N186 End stage renal disease: Secondary | ICD-10-CM | POA: Diagnosis not present

## 2018-05-13 DIAGNOSIS — N2581 Secondary hyperparathyroidism of renal origin: Secondary | ICD-10-CM | POA: Diagnosis not present

## 2018-05-14 DIAGNOSIS — N2581 Secondary hyperparathyroidism of renal origin: Secondary | ICD-10-CM | POA: Diagnosis not present

## 2018-05-14 DIAGNOSIS — N186 End stage renal disease: Secondary | ICD-10-CM | POA: Diagnosis not present

## 2018-05-15 DIAGNOSIS — N2581 Secondary hyperparathyroidism of renal origin: Secondary | ICD-10-CM | POA: Diagnosis not present

## 2018-05-15 DIAGNOSIS — N186 End stage renal disease: Secondary | ICD-10-CM | POA: Diagnosis not present

## 2018-05-16 ENCOUNTER — Other Ambulatory Visit: Payer: Self-pay

## 2018-05-16 ENCOUNTER — Ambulatory Visit (INDEPENDENT_AMBULATORY_CARE_PROVIDER_SITE_OTHER): Payer: Medicare HMO | Admitting: *Deleted

## 2018-05-16 DIAGNOSIS — I255 Ischemic cardiomyopathy: Secondary | ICD-10-CM | POA: Diagnosis not present

## 2018-05-16 DIAGNOSIS — N186 End stage renal disease: Secondary | ICD-10-CM | POA: Diagnosis not present

## 2018-05-16 DIAGNOSIS — N2581 Secondary hyperparathyroidism of renal origin: Secondary | ICD-10-CM | POA: Diagnosis not present

## 2018-05-16 LAB — CUP PACEART REMOTE DEVICE CHECK
Battery Remaining Longevity: 56 mo
Battery Voltage: 2.96 V
Brady Statistic AP VP Percent: 0.46 %
Brady Statistic AP VS Percent: 0 %
Brady Statistic AS VP Percent: 98.42 %
Brady Statistic AS VS Percent: 1.11 %
Brady Statistic RA Percent Paced: 0.47 %
Brady Statistic RV Percent Paced: 98.51 %
Date Time Interrogation Session: 20200414062604
HighPow Impedance: 62 Ohm
Implantable Lead Implant Date: 20181005
Implantable Lead Implant Date: 20181005
Implantable Lead Implant Date: 20181005
Implantable Lead Location: 753859
Implantable Lead Location: 753860
Implantable Lead Location: 753860
Implantable Lead Model: 3830
Implantable Lead Model: 5076
Implantable Lead Model: 6935
Implantable Pulse Generator Implant Date: 20181005
Lead Channel Impedance Value: 228 Ohm
Lead Channel Impedance Value: 285 Ohm
Lead Channel Impedance Value: 285 Ohm
Lead Channel Impedance Value: 304 Ohm
Lead Channel Impedance Value: 418 Ohm
Lead Channel Impedance Value: 532 Ohm
Lead Channel Pacing Threshold Amplitude: 0.625 V
Lead Channel Pacing Threshold Pulse Width: 0.4 ms
Lead Channel Sensing Intrinsic Amplitude: 11.5 mV
Lead Channel Sensing Intrinsic Amplitude: 11.5 mV
Lead Channel Sensing Intrinsic Amplitude: 2.75 mV
Lead Channel Sensing Intrinsic Amplitude: 2.75 mV
Lead Channel Setting Pacing Amplitude: 0.5 V
Lead Channel Setting Pacing Amplitude: 1.5 V
Lead Channel Setting Pacing Amplitude: 2.5 V
Lead Channel Setting Pacing Pulse Width: 0.03 ms
Lead Channel Setting Pacing Pulse Width: 1 ms
Lead Channel Setting Sensing Sensitivity: 0.3 mV

## 2018-05-17 DIAGNOSIS — N186 End stage renal disease: Secondary | ICD-10-CM | POA: Diagnosis not present

## 2018-05-17 DIAGNOSIS — N2581 Secondary hyperparathyroidism of renal origin: Secondary | ICD-10-CM | POA: Diagnosis not present

## 2018-05-18 DIAGNOSIS — N186 End stage renal disease: Secondary | ICD-10-CM | POA: Diagnosis not present

## 2018-05-18 DIAGNOSIS — N2581 Secondary hyperparathyroidism of renal origin: Secondary | ICD-10-CM | POA: Diagnosis not present

## 2018-05-19 DIAGNOSIS — N2581 Secondary hyperparathyroidism of renal origin: Secondary | ICD-10-CM | POA: Diagnosis not present

## 2018-05-19 DIAGNOSIS — N186 End stage renal disease: Secondary | ICD-10-CM | POA: Diagnosis not present

## 2018-05-20 DIAGNOSIS — N186 End stage renal disease: Secondary | ICD-10-CM | POA: Diagnosis not present

## 2018-05-20 DIAGNOSIS — N2581 Secondary hyperparathyroidism of renal origin: Secondary | ICD-10-CM | POA: Diagnosis not present

## 2018-05-21 DIAGNOSIS — N2581 Secondary hyperparathyroidism of renal origin: Secondary | ICD-10-CM | POA: Diagnosis not present

## 2018-05-21 DIAGNOSIS — N186 End stage renal disease: Secondary | ICD-10-CM | POA: Diagnosis not present

## 2018-05-22 DIAGNOSIS — N2581 Secondary hyperparathyroidism of renal origin: Secondary | ICD-10-CM | POA: Diagnosis not present

## 2018-05-22 DIAGNOSIS — N186 End stage renal disease: Secondary | ICD-10-CM | POA: Diagnosis not present

## 2018-05-23 ENCOUNTER — Encounter: Payer: Self-pay | Admitting: Cardiology

## 2018-05-23 DIAGNOSIS — N2581 Secondary hyperparathyroidism of renal origin: Secondary | ICD-10-CM | POA: Diagnosis not present

## 2018-05-23 DIAGNOSIS — N186 End stage renal disease: Secondary | ICD-10-CM | POA: Diagnosis not present

## 2018-05-23 NOTE — Progress Notes (Signed)
Remote ICD transmission.   

## 2018-05-24 DIAGNOSIS — N2581 Secondary hyperparathyroidism of renal origin: Secondary | ICD-10-CM | POA: Diagnosis not present

## 2018-05-24 DIAGNOSIS — N186 End stage renal disease: Secondary | ICD-10-CM | POA: Diagnosis not present

## 2018-05-25 DIAGNOSIS — N186 End stage renal disease: Secondary | ICD-10-CM | POA: Diagnosis not present

## 2018-05-25 DIAGNOSIS — N2581 Secondary hyperparathyroidism of renal origin: Secondary | ICD-10-CM | POA: Diagnosis not present

## 2018-05-26 DIAGNOSIS — N186 End stage renal disease: Secondary | ICD-10-CM | POA: Diagnosis not present

## 2018-05-26 DIAGNOSIS — N2581 Secondary hyperparathyroidism of renal origin: Secondary | ICD-10-CM | POA: Diagnosis not present

## 2018-05-27 DIAGNOSIS — N2581 Secondary hyperparathyroidism of renal origin: Secondary | ICD-10-CM | POA: Diagnosis not present

## 2018-05-27 DIAGNOSIS — N186 End stage renal disease: Secondary | ICD-10-CM | POA: Diagnosis not present

## 2018-05-28 DIAGNOSIS — N186 End stage renal disease: Secondary | ICD-10-CM | POA: Diagnosis not present

## 2018-05-28 DIAGNOSIS — N2581 Secondary hyperparathyroidism of renal origin: Secondary | ICD-10-CM | POA: Diagnosis not present

## 2018-05-29 DIAGNOSIS — N186 End stage renal disease: Secondary | ICD-10-CM | POA: Diagnosis not present

## 2018-05-29 DIAGNOSIS — N2581 Secondary hyperparathyroidism of renal origin: Secondary | ICD-10-CM | POA: Diagnosis not present

## 2018-05-30 DIAGNOSIS — N186 End stage renal disease: Secondary | ICD-10-CM | POA: Diagnosis not present

## 2018-05-30 DIAGNOSIS — N2581 Secondary hyperparathyroidism of renal origin: Secondary | ICD-10-CM | POA: Diagnosis not present

## 2018-05-31 DIAGNOSIS — N186 End stage renal disease: Secondary | ICD-10-CM | POA: Diagnosis not present

## 2018-05-31 DIAGNOSIS — N2581 Secondary hyperparathyroidism of renal origin: Secondary | ICD-10-CM | POA: Diagnosis not present

## 2018-06-01 DIAGNOSIS — Z992 Dependence on renal dialysis: Secondary | ICD-10-CM | POA: Diagnosis not present

## 2018-06-01 DIAGNOSIS — N186 End stage renal disease: Secondary | ICD-10-CM | POA: Diagnosis not present

## 2018-06-01 DIAGNOSIS — N2581 Secondary hyperparathyroidism of renal origin: Secondary | ICD-10-CM | POA: Diagnosis not present

## 2018-06-01 DIAGNOSIS — E1122 Type 2 diabetes mellitus with diabetic chronic kidney disease: Secondary | ICD-10-CM | POA: Diagnosis not present

## 2018-06-02 DIAGNOSIS — N186 End stage renal disease: Secondary | ICD-10-CM | POA: Diagnosis not present

## 2018-06-02 DIAGNOSIS — N2581 Secondary hyperparathyroidism of renal origin: Secondary | ICD-10-CM | POA: Diagnosis not present

## 2018-06-02 DIAGNOSIS — E1122 Type 2 diabetes mellitus with diabetic chronic kidney disease: Secondary | ICD-10-CM | POA: Diagnosis not present

## 2018-06-02 DIAGNOSIS — Z992 Dependence on renal dialysis: Secondary | ICD-10-CM | POA: Diagnosis not present

## 2018-06-03 DIAGNOSIS — N2581 Secondary hyperparathyroidism of renal origin: Secondary | ICD-10-CM | POA: Diagnosis not present

## 2018-06-03 DIAGNOSIS — N186 End stage renal disease: Secondary | ICD-10-CM | POA: Diagnosis not present

## 2018-06-04 DIAGNOSIS — N2581 Secondary hyperparathyroidism of renal origin: Secondary | ICD-10-CM | POA: Diagnosis not present

## 2018-06-04 DIAGNOSIS — N186 End stage renal disease: Secondary | ICD-10-CM | POA: Diagnosis not present

## 2018-06-05 DIAGNOSIS — N186 End stage renal disease: Secondary | ICD-10-CM | POA: Diagnosis not present

## 2018-06-05 DIAGNOSIS — N2581 Secondary hyperparathyroidism of renal origin: Secondary | ICD-10-CM | POA: Diagnosis not present

## 2018-06-06 DIAGNOSIS — N2581 Secondary hyperparathyroidism of renal origin: Secondary | ICD-10-CM | POA: Diagnosis not present

## 2018-06-06 DIAGNOSIS — N186 End stage renal disease: Secondary | ICD-10-CM | POA: Diagnosis not present

## 2018-06-07 DIAGNOSIS — N2581 Secondary hyperparathyroidism of renal origin: Secondary | ICD-10-CM | POA: Diagnosis not present

## 2018-06-07 DIAGNOSIS — N186 End stage renal disease: Secondary | ICD-10-CM | POA: Diagnosis not present

## 2018-06-08 DIAGNOSIS — N2581 Secondary hyperparathyroidism of renal origin: Secondary | ICD-10-CM | POA: Diagnosis not present

## 2018-06-08 DIAGNOSIS — N186 End stage renal disease: Secondary | ICD-10-CM | POA: Diagnosis not present

## 2018-06-09 DIAGNOSIS — N186 End stage renal disease: Secondary | ICD-10-CM | POA: Diagnosis not present

## 2018-06-09 DIAGNOSIS — N2581 Secondary hyperparathyroidism of renal origin: Secondary | ICD-10-CM | POA: Diagnosis not present

## 2018-06-10 DIAGNOSIS — N186 End stage renal disease: Secondary | ICD-10-CM | POA: Diagnosis not present

## 2018-06-10 DIAGNOSIS — N2581 Secondary hyperparathyroidism of renal origin: Secondary | ICD-10-CM | POA: Diagnosis not present

## 2018-06-11 DIAGNOSIS — N186 End stage renal disease: Secondary | ICD-10-CM | POA: Diagnosis not present

## 2018-06-11 DIAGNOSIS — N2581 Secondary hyperparathyroidism of renal origin: Secondary | ICD-10-CM | POA: Diagnosis not present

## 2018-06-12 DIAGNOSIS — N186 End stage renal disease: Secondary | ICD-10-CM | POA: Diagnosis not present

## 2018-06-12 DIAGNOSIS — N2581 Secondary hyperparathyroidism of renal origin: Secondary | ICD-10-CM | POA: Diagnosis not present

## 2018-06-13 DIAGNOSIS — N186 End stage renal disease: Secondary | ICD-10-CM | POA: Diagnosis not present

## 2018-06-13 DIAGNOSIS — N2581 Secondary hyperparathyroidism of renal origin: Secondary | ICD-10-CM | POA: Diagnosis not present

## 2018-06-14 DIAGNOSIS — N2581 Secondary hyperparathyroidism of renal origin: Secondary | ICD-10-CM | POA: Diagnosis not present

## 2018-06-14 DIAGNOSIS — N186 End stage renal disease: Secondary | ICD-10-CM | POA: Diagnosis not present

## 2018-06-15 DIAGNOSIS — N186 End stage renal disease: Secondary | ICD-10-CM | POA: Diagnosis not present

## 2018-06-15 DIAGNOSIS — N2581 Secondary hyperparathyroidism of renal origin: Secondary | ICD-10-CM | POA: Diagnosis not present

## 2018-06-16 DIAGNOSIS — N2581 Secondary hyperparathyroidism of renal origin: Secondary | ICD-10-CM | POA: Diagnosis not present

## 2018-06-16 DIAGNOSIS — N186 End stage renal disease: Secondary | ICD-10-CM | POA: Diagnosis not present

## 2018-06-17 DIAGNOSIS — N186 End stage renal disease: Secondary | ICD-10-CM | POA: Diagnosis not present

## 2018-06-17 DIAGNOSIS — N2581 Secondary hyperparathyroidism of renal origin: Secondary | ICD-10-CM | POA: Diagnosis not present

## 2018-06-18 DIAGNOSIS — N186 End stage renal disease: Secondary | ICD-10-CM | POA: Diagnosis not present

## 2018-06-18 DIAGNOSIS — N2581 Secondary hyperparathyroidism of renal origin: Secondary | ICD-10-CM | POA: Diagnosis not present

## 2018-06-19 DIAGNOSIS — N2581 Secondary hyperparathyroidism of renal origin: Secondary | ICD-10-CM | POA: Diagnosis not present

## 2018-06-19 DIAGNOSIS — N186 End stage renal disease: Secondary | ICD-10-CM | POA: Diagnosis not present

## 2018-06-20 DIAGNOSIS — N186 End stage renal disease: Secondary | ICD-10-CM | POA: Diagnosis not present

## 2018-06-20 DIAGNOSIS — N2581 Secondary hyperparathyroidism of renal origin: Secondary | ICD-10-CM | POA: Diagnosis not present

## 2018-06-21 DIAGNOSIS — N2581 Secondary hyperparathyroidism of renal origin: Secondary | ICD-10-CM | POA: Diagnosis not present

## 2018-06-21 DIAGNOSIS — N186 End stage renal disease: Secondary | ICD-10-CM | POA: Diagnosis not present

## 2018-06-22 DIAGNOSIS — N2581 Secondary hyperparathyroidism of renal origin: Secondary | ICD-10-CM | POA: Diagnosis not present

## 2018-06-22 DIAGNOSIS — N186 End stage renal disease: Secondary | ICD-10-CM | POA: Diagnosis not present

## 2018-06-23 DIAGNOSIS — N186 End stage renal disease: Secondary | ICD-10-CM | POA: Diagnosis not present

## 2018-06-23 DIAGNOSIS — N2581 Secondary hyperparathyroidism of renal origin: Secondary | ICD-10-CM | POA: Diagnosis not present

## 2018-06-24 DIAGNOSIS — N2581 Secondary hyperparathyroidism of renal origin: Secondary | ICD-10-CM | POA: Diagnosis not present

## 2018-06-24 DIAGNOSIS — N186 End stage renal disease: Secondary | ICD-10-CM | POA: Diagnosis not present

## 2018-06-25 DIAGNOSIS — N186 End stage renal disease: Secondary | ICD-10-CM | POA: Diagnosis not present

## 2018-06-25 DIAGNOSIS — N2581 Secondary hyperparathyroidism of renal origin: Secondary | ICD-10-CM | POA: Diagnosis not present

## 2018-06-26 ENCOUNTER — Emergency Department (HOSPITAL_COMMUNITY)
Admission: EM | Admit: 2018-06-26 | Discharge: 2018-07-03 | Disposition: E | Payer: Medicare HMO | Attending: Emergency Medicine | Admitting: Emergency Medicine

## 2018-06-26 DIAGNOSIS — I509 Heart failure, unspecified: Secondary | ICD-10-CM

## 2018-06-26 DIAGNOSIS — I499 Cardiac arrhythmia, unspecified: Secondary | ICD-10-CM | POA: Diagnosis not present

## 2018-06-26 DIAGNOSIS — J9601 Acute respiratory failure with hypoxia: Secondary | ICD-10-CM

## 2018-06-26 DIAGNOSIS — N186 End stage renal disease: Secondary | ICD-10-CM | POA: Diagnosis not present

## 2018-06-26 DIAGNOSIS — R079 Chest pain, unspecified: Secondary | ICD-10-CM | POA: Diagnosis not present

## 2018-06-26 DIAGNOSIS — I469 Cardiac arrest, cause unspecified: Secondary | ICD-10-CM | POA: Insufficient documentation

## 2018-06-26 DIAGNOSIS — R404 Transient alteration of awareness: Secondary | ICD-10-CM | POA: Diagnosis not present

## 2018-06-26 DIAGNOSIS — N2581 Secondary hyperparathyroidism of renal origin: Secondary | ICD-10-CM | POA: Diagnosis not present

## 2018-06-26 DIAGNOSIS — I132 Hypertensive heart and chronic kidney disease with heart failure and with stage 5 chronic kidney disease, or end stage renal disease: Secondary | ICD-10-CM | POA: Diagnosis not present

## 2018-06-26 DIAGNOSIS — E1165 Type 2 diabetes mellitus with hyperglycemia: Secondary | ICD-10-CM | POA: Diagnosis not present

## 2018-06-26 MED ORDER — EPINEPHRINE 1 MG/10ML IJ SOSY
PREFILLED_SYRINGE | INTRAMUSCULAR | Status: AC | PRN
  Administered 2018-06-26: 1 mg via INTRAVENOUS

## 2018-06-27 MED FILL — Medication: Qty: 1 | Status: AC

## 2018-07-03 NOTE — ED Triage Notes (Signed)
Pt bib ems from home after witnessed cardiac arrest. Per ems, pt was complaining of cp prior to event. Cpr in progress on pt arrival to the ED. Initially on scene pt in PEA with a rate of 60. CPR started by ems at 1512. Pt given a total of 8 epi, 88meq Bicard and 1 of calcium pta. Rosc achieved. Then pt went into vfib and shocked X3 with ems. Pt given 450mg  amiodarone en route.

## 2018-07-03 NOTE — Code Documentation (Signed)
Pulse check shows pt remains pulseless

## 2018-07-03 NOTE — ED Provider Notes (Signed)
[facility] EMERGENCY DEPARTMENT Provider Note   CSN: [id number] Arrival date & time: 07-04-2018  1605    History   Chief Complaint Chief Complaint  Patient presents with   Cardiac Arrest    HPI Kristen Bradley is a 73 y.o. female.  Significant comorbidities including coronary artery disease, hypertension, ESRD, HFrEF with ICD who presents to the ED in cardiac arrest. History is limited by acuity of condition. History obtained via EMS. Per EMS patient was complaining of chest pain before going unresponsive. Fiance called 911. On arrival patient was in PEA arrest. Unknown downtime.     HPI  Past Medical History:  Diagnosis Date   3-vessel CAD - treated medically  02/26/2016   Acute renal failure superimposed on stage 3 chronic kidney disease (HCC)    Adrenal tumor 08/2007   surgery   Aftercare following surgery of the circulatory system, NEC 05/26/2012   AKI (acute kidney injury) ([place])    Atherosclerosis of native artery of extremity with intermittent claudication ([place]) 01/26/2013   CAD, multiple vessel    Cancer of right breast ([place])    s/p lumpectomy, XRT   Cardiomyopathy, ischemic    Carotid artery disease ([place])    s/p bilateral CEA   Carotid stenosis, bilateral-s/p Bilateral CEA 11/05/2011   Chronic ITP (idiopathic thrombocytopenia) (HCC) 04/16/1759   Chronic systolic (congestive) heart failure ([place]) 60/08/3708   Chronic systolic heart failure ([place]) 03/03/2016   CKD (chronic kidney disease) stage 4, GFR 15-29 ml/min ([place])    Community acquired pneumonia 01/29/2016   Dyspnea    Elevated brain natriuretic peptide (BNP) level 01/30/2016   Elevated troponin I level 01/30/2016   Hepatitis C    "got treatment for it" (01/29/2016)   HFrEF (heart failure with reduced ejection fraction) (HCC)    High cholesterol    takes Lopid daily   History of UTI    takes Diflucan dail--pt states no uti in a couple of yrs though   Hx of adenomatous  colonic polyps    Hypertension    takes Losartan,Amlodipine,HCTZ,and Atenolol daily and Clatarpres   Intermittent claudication ([place]) 11/05/2011   Ischemic cardiomyopathy    a. 11/2016 s/p MDT BiV ICD w/ his bundle pacing.   Joint pain    legs   LBBB- new 01/30/2016   NSTEMI (non-ST elevated myocardial infarction) ([place])    Occlusion and stenosis of carotid artery without mention of cerebral infarction 12/17/2011   PAD (peripheral artery disease) ([place]) 11/26/2011   Pain in limb 09/08/2012   Peripheral vascular disease ([place]) 11/05/2011   Renal artery stenosis (HCC)    Right renal artery stent 05/31/06   Renal failure syndrome    Sinus bradycardia    Sinus pause    Stroke [facility]) Jan. 7, 2014   Thrombocytopenia- plts 81K 01/30/2016   Tobacco abuse    Type 2 diabetes mellitus with vascular disease ([place])    Type II diabetes mellitus ([place])    Unstable angina [facility])     Patient Active Problem List   Diagnosis Date Noted   Hx of adenomatous colonic polyps    Sensation of cold in leg 11/25/2017   Type 2 diabetes mellitus with vascular disease ([place])    Tobacco abuse    Renal artery stenosis (HCC)    Joint pain    Ischemic cardiomyopathy    History of UTI    High cholesterol    HFrEF (heart failure with reduced ejection fraction) (HCC)    Hepatitis C  Carotid artery disease ([place])    Cancer of right breast ([place])    Chronic systolic (congestive) heart failure ([place]) 11/05/2016   CKD (chronic kidney disease) stage 4, GFR 15-29 ml/min (HCC)    CAD (coronary artery disease)    Chronic systolic heart failure ([place]) 03/03/2016   3-vessel CAD - treated medically  02/26/2016   Chronic ITP (idiopathic thrombocytopenia) (HCC) 02/24/2016   Sinus bradycardia    Renal failure syndrome    Sinus pause    NSTEMI (non-ST elevated myocardial infarction) ([place])    Cardiomyopathy, ischemic    LBBB- new 01/30/2016   Thrombocytopenia- plts 81K 01/30/2016    Hypertension 01/29/2016   Diabetes mellitus with complication (HCC)    Solitary pulmonary nodule 08/10/2013   Atherosclerosis of native artery of extremity with intermittent claudication ([place]) 01/26/2013   Pain in limb 09/08/2012   Stroke ([place]) 02/08/2012   Occlusion and stenosis of carotid artery without mention of cerebral infarction 12/17/2011   PAD (peripheral artery disease) ([place]) 11/26/2011   Peripheral vascular disease ([place]) 11/05/2011   Intermittent claudication ([place]) 11/05/2011   Carotid stenosis, bilateral-s/p Bilateral CEA 11/05/2011   Adrenal tumor 08/02/2007    Past Surgical History:  Procedure Laterality Date   ABDOMINAL AORTOGRAM W/LOWER EXTREMITY N/A 11/21/2017   Procedure: ABDOMINAL AORTOGRAM W/LOWER EXTREMITY;  Surgeon: Waynetta Sandy, MD;  Location: [facility] CV LAB;  Service: Cardiovascular;  Laterality: N/A;   ABDOMINAL HYSTERECTOMY     ADRENAL GLAND SURGERY  2009   BIV ICD INSERTION CRT-D N/A 11/05/2016   Procedure: BIV ICD INSERTION CRT-D;  Surgeon: Evans Lance, MD;  Location: [facility] [place];  Service: Cardiovascular;  Laterality: N/A;   BREAST LUMPECTOMY Right 2009   CARDIAC CATHETERIZATION N/A 02/03/2016   Procedure: Right/Left Heart Cath and Coronary Angiography;  Surgeon: Burnell Blanks, MD;  Location: [facility] LAB;  Service: Cardiovascular;  Laterality: N/A;   COLONOSCOPY WITH PROPOFOL N/A 03/14/2018   Procedure: COLONOSCOPY WITH PROPOFOL;  Surgeon: Gatha Mayer, MD;  Location: [facility];  Service: Endoscopy;  Laterality: N/A;   EMBOLECTOMY Right 11/25/2017   Procedure: ILIAC EMBOLECTOMY;  Surgeon: Marty Heck, MD;  Location: [facility];  Service: Vascular;  Laterality: Right;   ENDARTERECTOMY  01/05/2012   Procedure: ENDARTERECTOMY CAROTID;  Surgeon: Conrad [place]and[place], MD;  Location: [facility];  Service: Vascular;  Laterality: Right;  Right Carotid Artery Endarterectomy, right stump pressure, intraoperative  ultrasound   ENDARTERECTOMY  02/08/2012   Procedure: ENDARTERECTOMY CAROTID;  Surgeon: Conrad [place], MD;  Location: [facility];  Service: Vascular;  Laterality: Left;   ENDARTERECTOMY FEMORAL Right 11/25/2017   Procedure: RIGHT FEMORAL ENDARTERECTOMY WITH PROFUNOPLASTY AND BOVINE PATCH ANGIOPLAST RIGHT ILIOFEMORAL; RIGHT ILIAC THROMBOEMBOLECTOMY;  Surgeon: Marty Heck, MD;  Location: [facility];  Service: Vascular;  Laterality: Right;   LUMBAR [place]  02/08/2012   Procedure: PATCH ANGIOPLASTY;  Surgeon: Conrad Wagram, MD;  Location: [facility];  Service: Vascular;  Laterality: Left;  Carotid artery patch angioplasty using Vascu-Guard bovine patch 1cm x 6cm.   PERIPHERAL VASCULAR INTERVENTION Bilateral 11/21/2017   Procedure: PERIPHERAL VASCULAR INTERVENTION;  Surgeon: Waynetta Sandy, MD;  Location: [facility] CV LAB;  Service: Cardiovascular;  Laterality: Bilateral;  Iliac Stents   POLYPECTOMY  03/14/2018   Procedure: POLYPECTOMY;  Surgeon: Gatha Mayer, MD;  [facility];  Service: Endoscopy;;   RENAL ARTERY STENT Right 05/2006     OB History  No obstetric history on file.      Home Medications    Prior to Admission medications   Medication Sig Start Date End Date Taking? Authorizing Provider  acetaminophen (TYLENOL) 500 MG tablet Take 1,000 mg by mouth every 6 (six) hours as needed for mild pain or headache.     [provider]  aspirin EC 81 MG tablet Take 1 tablet (81 mg total) by mouth daily. 11/28/17   Dagoberto Ligas, PA-C  B Complex-C-Zn-Folic Acid (DIALYVITE 009 WITH ZINC) 0.8 MG TABS Take 1 tablet by mouth daily. 02/22/18   [provider]  calcitRIOL (ROCALTROL) 0.5 MCG capsule Take 0.5 mcg by mouth daily. 01/23/18   [provider]  carvedilol (COREG) 6.25 MG tablet Take 1 tablet (6.25 mg total) by mouth 2 (two) times daily with a meal. Patient taking differently: Take 6.25 mg by mouth 2 (two) times  daily.  02/03/18   Burnell Blanks, MD  clopidogrel (PLAVIX) 75 MG tablet Take 75 mg by mouth daily. 02/16/18   [provider]  furosemide (LASIX) 80 MG tablet TAKE 1 TABLET BY MOUTH TWICE DAILY Patient not taking: No sig reported 09/16/17   Burnell Blanks, MD  glipiZIDE (GLUCOTROL XL) 2.5 MG 24 hr tablet Take 2.5 mg by mouth daily with breakfast. 02/21/18   [provider]  isosorbide mononitrate (IMDUR) 60 MG 24 hr tablet Take 1 tablet (60 mg total) by mouth daily. Patient not taking: Reported on 03/03/2018 07/12/17   Rai, Vernelle Emerald, MD  nitroGLYCERIN (NITROSTAT) 0.4 MG SL tablet Place 1 tablet (0.4 mg total) under the tongue every 5 (five) minutes as needed for chest pain. 03/02/16   Burnell Blanks, MD  potassium chloride SA (K-DUR,KLOR-CON) 20 MEQ tablet Take 30 mEq by mouth 2 (two) times daily. 02/11/18   [provider]  rosuvastatin (CRESTOR) 20 MG tablet Take 20 mg by mouth daily.    [provider]    Family History Family History  Problem Relation Age of Onset   Cancer Mother        ? type   Cancer Father        ? type   Diabetes Sister    Hypertension Sister    Diabetes Brother    Hypertension Brother    CAD Neg Hx     Social History Social History   Tobacco Use   Smoking status: Former Smoker    Packs/day: 0.50    Years: 53.00    Pack years: 26.50    Types: Cigarettes    Last attempt to quit: 03/2016    Years since quitting: 2.3   Smokeless tobacco: Never Used  Substance Use Topics   Alcohol use: No    Alcohol/week: 0.0 standard drinks   Drug use: No     Allergies   Chlorhexidine gluconate; Ace inhibitors; and Dilaudid [hydromorphone hcl]   Review of Systems Review of Systems  Unable to perform ROS: Acuity of condition  Cardiovascular: Positive for chest pain.     Physical Exam Updated Vital Signs BP (!) 0/0    Temp (!) 96.1 F (35.6 C) (Temporal)    Resp (!) 0    Ht _0  (1.626  m)    Wt 57.2 kg    SpO2 (!) 0%    BMI 21.65 kg/m   Physical Exam Vitals signs and nursing note reviewed.  Constitutional:      General: She is in acute distress.     Appearance: She  is well-developed.     Comments: LUCAS in place. CPR in progress on arrival  HENT:     Head: Normocephalic and atraumatic.     Nose: Nose normal.  Eyes:     Comments: Pupils 83m and sluggish  Neck:     Musculoskeletal: Neck supple.  Cardiovascular:     Heart sounds: No murmur.     Comments: Pulseless Pulmonary:     Comments: King airway in place Abdominal:     General: There is distension.     Hernia: No hernia is present.  Musculoskeletal:        General: No deformity or signs of injury.  Skin:    General: Skin is warm and dry.     Findings: No bruising or erythema.  Neurological:     Mental Status: She is unresponsive.     GCS: GCS eye subscore is 1. GCS verbal subscore is 1. GCS motor subscore is 1.      ED Treatments / Results  Labs (all labs ordered are listed, but only abnormal results are displayed) Labs Reviewed - No data to display  EKG EKG Interpretation  Date/Time:  MJun 20, 202016:08:24 EDT Ventricular Rate:  101 PR Interval:    QRS Duration: 225 QT Interval:  417 QTC Calculation: 541 R Axis:   -77 Text Interpretation:  Sinus or ectopic atrial tachycardia Consider right atrial enlargement Nonspecific IVCD with LAD LVH with secondary repolarization abnormality Artifact in lead(s) I III aVL V2 Interpretation limited secondary to artifact Confirmed by GSherwood Gambler([phone] on 520-Jun-202010:45:29 PM   Radiology No results found.  Procedures Procedure Name: Intubation Date/Time: 506-20-204:00 PM Performed by: FDoneta Public MD Pre-anesthesia Checklist: Patient identified, Suction available, Timeout performed and Patient being monitored Preoxygenation: Pre-oxygenation with 100% oxygen Induction Type: Rapid sequence Laryngoscope Size: Mac and 4 Grade View:  Grade I Tube size: 7.5 mm Number of attempts: 1 Airway Equipment and Method: Rigid stylet and Video-laryngoscopy Secured at: 23 cm Tube secured with: ETT holder      (including critical care time)  Medications Ordered in ED Medications  EPINEPHrine (ADRENALIN) 1 MG/10ML injection (1 mg Intravenous Given [id number])     Initial Impression / Assessment and Plan / ED Course  I have reviewed the triage vital signs and the nursing notes.  Pertinent labs & imaging results that were available during my care of the patient were reviewed by me and considered in my medical decision making (see chart for details).         Kristen GMACKYNZIE WOOLFORDis a 73y.o. female.  Significant comorbidities including coronary artery disease, hypertension, ESRD, HFrEF with ICD who presents to the ED in cardiac arrest.   PTA: -EMS coding patient for since 15:12 (544ms PTA) -Initial presenting rhythm PEA -8 Epi -1 Bicarb -1G calcium -Went into V-tach and received 3 shocks then back to PEA arrest.   On arrival patient is on the LUCAS with CPR in progress. Definitive airway placed. Patient pulseless on first pulse check. 2 rounds of ACLS given. Patient remains pulseless in PEA arrest.  Side ultrasound showed no cardiac activity/motion and no pericardial effusion.  At this time patient had been unresponsive with CPR in progress for over an hour with an unknown downtime.  Patient was pronounced dead at 167 Personally spoke with and updated family including her fianc, son, and daughter-in-law.   Final Clinical Impressions(s) / ED Diagnoses   Final diagnoses:  Cardiac arrest ([place] Acute  respiratory failure with hypoxia (HCC)  PEA (Pulseless electrical activity) ([place])  ESRD (end stage renal disease) ([place])  Chronic congestive heart failure, unspecified heart failure type [facility])    ED Discharge Orders    None       Doneta Public, MD 24-Jul-2018 [id number]    Sherwood Gambler, MD 06/28/18 1301

## 2018-07-03 NOTE — Progress Notes (Signed)
   Jul 18, 2018 1700  Clinical Encounter Type  Visited With Family;Patient and family together  Visit Type Initial;Spiritual support;Death;ED  Referral From Nurse  Consult/Referral To Chaplain  Spiritual Encounters  Spiritual Needs Emotional;Prayer;Grief support  Stress Factors  Family Stress Factors Loss;Major life changes   Responded to [place] to ED. Nurse requested Chaplain presence for family of deceased pt. Family was in Consult room and I escorted them to room 14. I offered spiritual care with words of comfort, ministry of presence, and prayer. I gave Son a patient placement card. (Son) Jeneen Rinks A. Farina [phone] Chaplain Fidel Levy  [phone]

## 2018-07-03 NOTE — Code Documentation (Signed)
Patient time of death occurred at 43.

## 2018-07-03 NOTE — Code Documentation (Signed)
Pulse check reveals no pulse. CPR resumed.

## 2018-07-03 NOTE — ED Provider Notes (Signed)
I saw and evaluated the patient, reviewed the resident's note and I agree with the findings and plan. I directly supervised the intubation  EKG: EKG Interpretation  Date/Time:  2018-07-03 16:08:24 EDT Ventricular Rate:  101 PR Interval:    QRS Duration: 225 QT Interval:  417 QTC Calculation: 541 R Axis:   -77 Text Interpretation:  Sinus or ectopic atrial tachycardia Consider right atrial enlargement Nonspecific IVCD with LAD LVH with secondary repolarization abnormality Artifact in lead(s) I III aVL V2 Interpretation limited secondary to artifact Confirmed by Sherwood Gambler [phone]) on 07/03/18 10:45:29 PM  Patient presents in CPR.  The patient has now been having CPR for 1 hour with EMS.  Given the numerous epinephrines, no cardiac activity on bedside ultrasound, and dismal prognosis, we stopped CPR.  Family will be updated.  Given the preceding chest pain this is probably from an MI.  Cardiopulmonary Resuscitation (CPR) Procedure Note Directed/Performed by: Ephraim Hamburger I personally directed ancillary staff and/or performed CPR in an effort to regain return of spontaneous circulation and to maintain cardiac, neuro and systemic perfusion.       Sherwood Gambler, MD 2018-07-03 2257

## 2018-07-03 DEATH — deceased

## 2019-11-01 IMAGING — PT NM PET TUM IMG INITIAL (PI) SKULL BASE T - THIGH
1 of 8 series · 1 of 25 positions shown · non-contrast
Comparison: Multiple exams, including CT scan 05/18/2017 and
12/31/2010

CLINICAL DATA: Initial treatment strategy for lung nodule.

EXAM:
NUCLEAR MEDICINE PET SKULL BASE TO THIGH
TECHNIQUE: 8.0 mCi F-18 FDG was injected intravenously. Full-ring PET imaging
was performed from the skull base to thigh after the radiotracer. CT
data was obtained and used for attenuation correction and anatomic
localization.
Fasting blood glucose: 121 mg/dl

[Series 4: ct sk_thigh 5.0 b31f · axial · 5.0mm · 0.98mm/px · 1 of 239 slices shown]
[im 239/239  brain]
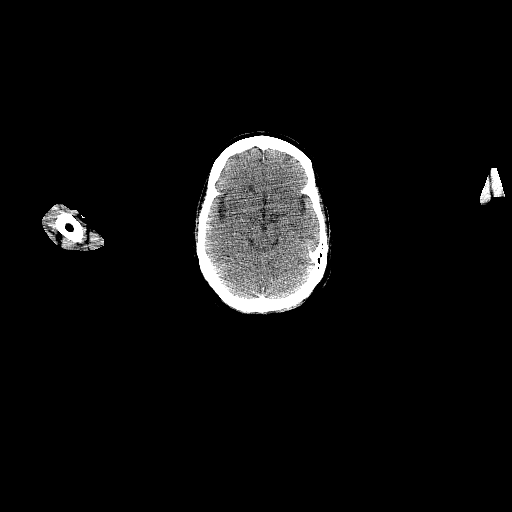

[1 of 25 positions shown; findings below may reference images not displayed]

FINDINGS: Mediastinal blood pool activity: SUV max

Note: Misregistered CT and PET data in the head/neck region. I
carefully reviewed the non attenuation corrected images.

NECK: No significant abnormal hypermetabolic activity in this
region.

Incidental CT findings: Carotid atherosclerotic calcifications.

CHEST: The right lower lobe nodule has indistinct margins, probably
because it is blurred by motion artifact. This nodule measures
approximately 1.0 by 0.6 cm on today's exam, with maximum standard
uptake value of this indistinct density 1.2.

Incidental CT findings:

Right anterior subpleural reticulation along the right middle lobe
without hypermetabolic activity, compatible with prior radiation
therapy port. Coronary, aortic arch, and branch vessel
atherosclerotic vascular disease. Chronically stable saccular
outpouching of the aortic arch measuring approximately 1.7 by 1.1 cm
on image 54/4, similar to that shown on prior chest CT.
Mild-to-moderate cardiomegaly. Pacer/AICD device noted. 0.7 cm right
paratracheal lymph node on image 54/4. No hypermetabolic nodal
activity in the chest.

ABDOMEN/PELVIS: No significant abnormal hypermetabolic activity in
this region.

Incidental CT findings: Postoperative findings along the pancreatic
tail and adjacent to the spleen. Aortoiliac atherosclerotic vascular
disease. Bilateral renal lesions of varying complexity and density;
no obvious lesions hypermetabolic to the renal parenchyma but this
does not necessarily exclude small renal neoplasm. Infrarenal
abdominal aortic aneurysm measuring up to 3.8 cm obliquely, slight
saccular component, not changed from previous. No pathologic
adenopathy.

Descending and sigmoid colon diverticulosis.

There continues to be focal accentuated density adjacent to the
sigmoid colon measuring 2.6 by 1.6 cm on image 152/4. This has
low-grade accentuated metabolic activity with maximum SUV 3.6. This
has a slightly and slightly tethering appearance on multiplanar
imaging.

SKELETON: No significant abnormal hypermetabolic activity in this
region.

Incidental CT findings: Degenerative glenohumeral arthropathy on the
left. Lumbar spondylosis and degenerative disc disease at L3-4 and
L4-5.
IMPRESSION: 1. The right lower lobe nodule is blurred by motion artifact and has
slightly indistinct nodules but measures about 1.0 by 0.6. Maximum
standard uptake value is 1.2. Although the maximum SUV is somewhat
reassuring, low-grade adenocarcinoma is not excluded, and the
probable increase in size compared to 2063 is still concerning. At a
minimum, surveillance by chest CT is likely warranted.
2. Again observed is a bandlike density adjacent to the sigmoid
colon in the upper pelvis with low-grade metabolic activity, maximum
SUV 3.6. This has an appearance suggesting postinflammatory findings
related to prior diverticulitis. Strictly speaking, a carcinoid
tumor or local colon cancer could have a similar appearance, and I
would recommend correlation with colon cancer screening and
continued surveillance.
3. Other imaging findings of potential clinical significance: Aortic
Atherosclerosis (6VYI5-EAS.S). Coronary atherosclerosis. Chronic
saccular outpouching of the aortic arch. Aortic aneurysm NOS
(6VYI5-V5V.T). Abdominal aortic aneurysm 3.6 cm at its widest. This
is stable. Postoperative findings along the pancreatic tail and
spleen. Mild-to-moderate cardiomegaly. Bilateral renal lesions of
varying complexity, most likely to be cysts although not technically
specific in some cases. Descending and sigmoid colon diverticulosis.
Lumbar spondylosis and degenerative disc disease.

## 2020-03-14 IMAGING — CR DG ABDOMEN 1V
2 series · 2 of 2 positions shown · non-contrast
Comparison: CT 06/16/2017

CLINICAL DATA: 71-year-old female with a history of pain during
dialysis

EXAM:
ABDOMEN - 1 VIEW

[t abdomen supine (1 of 2)]
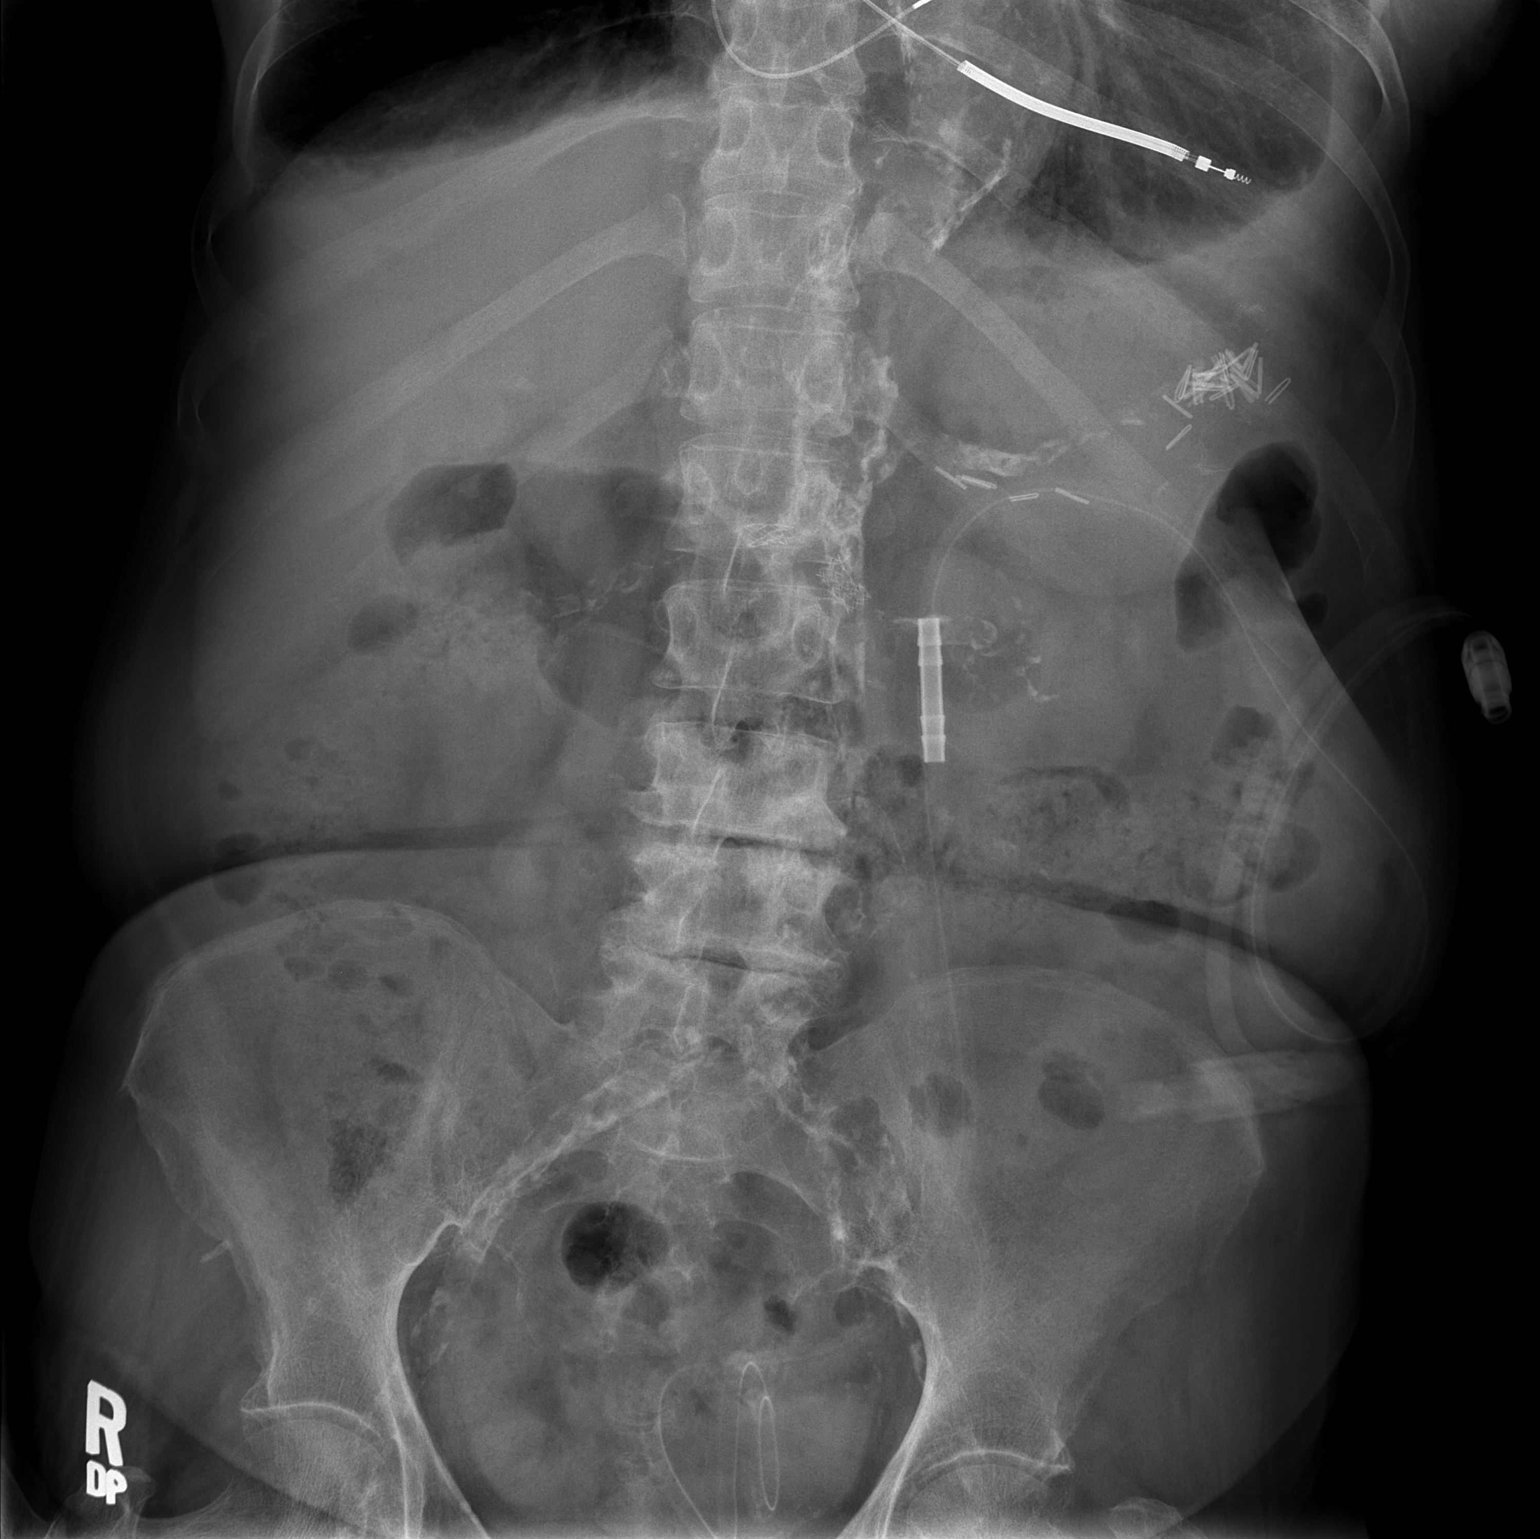

[t abdomen supine (2 of 2)]
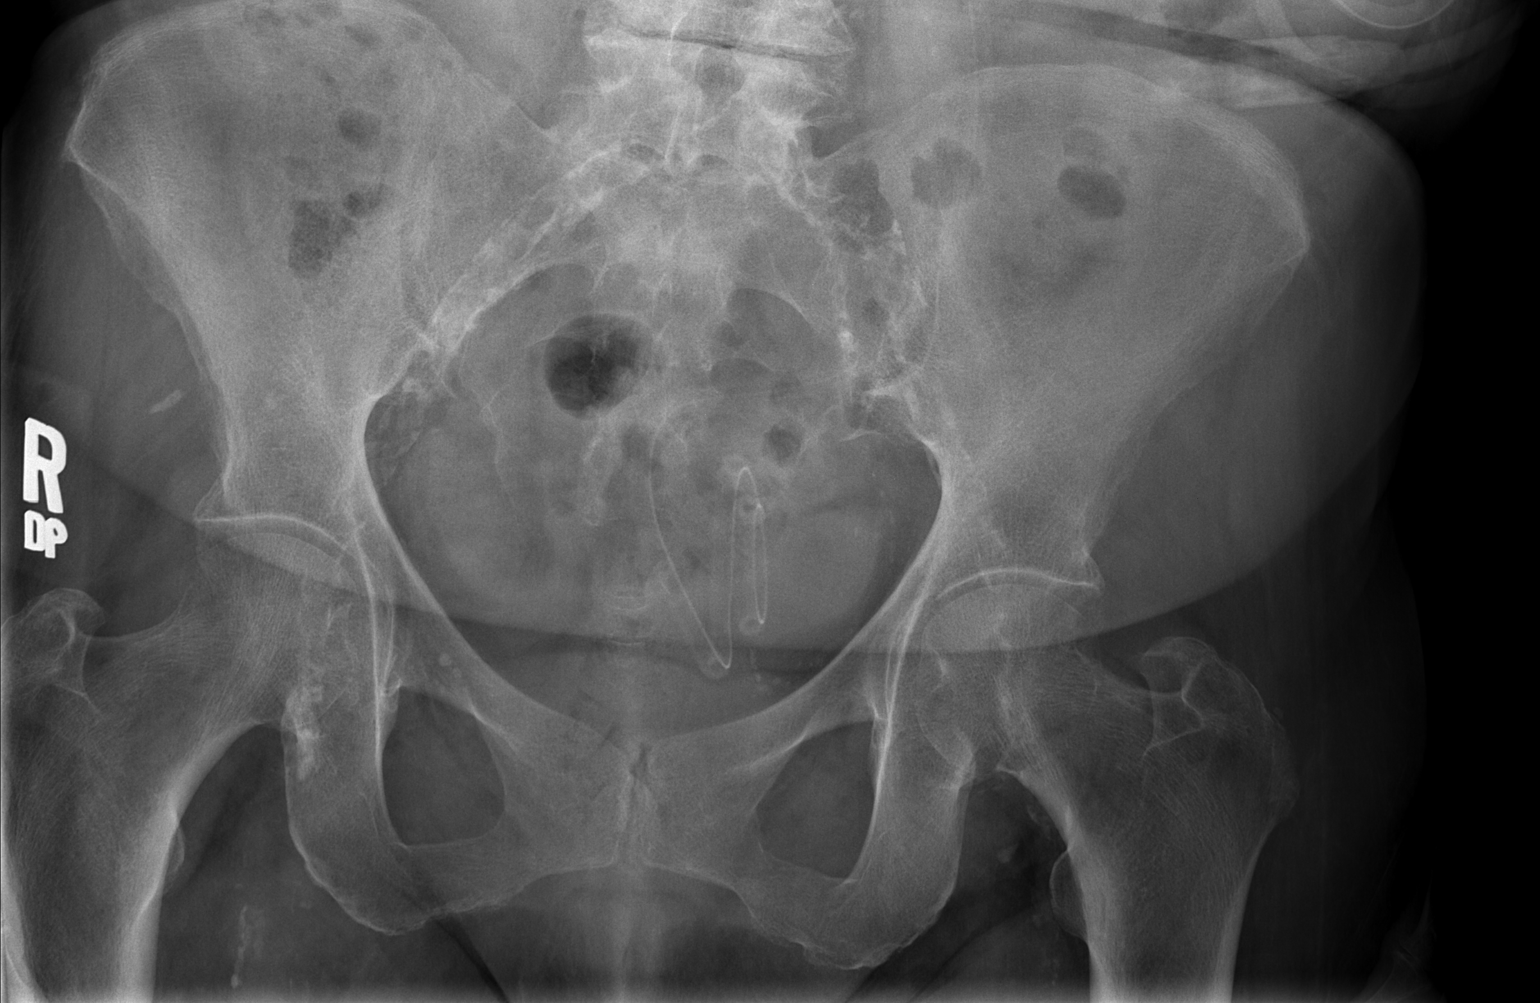

[2 of 2 positions shown; findings below may reference images not displayed]

FINDINGS: Gas within stomach small bowel and colon.  No abnormal distention.

Surgical changes of the left upper quadrant.

Peritoneal dialysis catheter projects over the left abdomen and
pelvis.

Dense calcifications of the vascular structures.

No unexpected radiopaque foreign body. No displaced fracture.
Degenerative changes of the spine.
IMPRESSION: Nonobstructive bowel gas pattern.

Peritoneal dialysis catheter projects over the left abdomen and the
pelvis.

Atherosclerosis.
